# Patient Record
Sex: Male | Born: 1935 | Race: White | Hispanic: No | Marital: Single | Smoking: Former smoker
Health system: Southern US, Community
[De-identification: ages and names within clinical notes are randomized; demographics above are authoritative.]

## PROBLEM LIST (undated history)

## (undated) DIAGNOSIS — G8929 Other chronic pain: Secondary | ICD-10-CM

## (undated) DIAGNOSIS — M25562 Pain in left knee: Secondary | ICD-10-CM

## (undated) DIAGNOSIS — M1990 Unspecified osteoarthritis, unspecified site: Secondary | ICD-10-CM

## (undated) DIAGNOSIS — N1330 Unspecified hydronephrosis: Secondary | ICD-10-CM

## (undated) DIAGNOSIS — R937 Abnormal findings on diagnostic imaging of other parts of musculoskeletal system: Secondary | ICD-10-CM

## (undated) DIAGNOSIS — R972 Elevated prostate specific antigen [PSA]: Secondary | ICD-10-CM

## (undated) DIAGNOSIS — M25561 Pain in right knee: Secondary | ICD-10-CM

## (undated) DIAGNOSIS — R9089 Other abnormal findings on diagnostic imaging of central nervous system: Secondary | ICD-10-CM

## (undated) DIAGNOSIS — K635 Polyp of colon: Secondary | ICD-10-CM

## (undated) DIAGNOSIS — N400 Enlarged prostate without lower urinary tract symptoms: Secondary | ICD-10-CM

## (undated) DIAGNOSIS — G629 Polyneuropathy, unspecified: Secondary | ICD-10-CM

## (undated) DIAGNOSIS — E039 Hypothyroidism, unspecified: Secondary | ICD-10-CM

## (undated) DIAGNOSIS — N189 Chronic kidney disease, unspecified: Secondary | ICD-10-CM

## (undated) DIAGNOSIS — K219 Gastro-esophageal reflux disease without esophagitis: Secondary | ICD-10-CM

## (undated) DIAGNOSIS — Z5189 Encounter for other specified aftercare: Secondary | ICD-10-CM

## (undated) DIAGNOSIS — R51 Headache: Secondary | ICD-10-CM

## (undated) DIAGNOSIS — I10 Essential (primary) hypertension: Secondary | ICD-10-CM

## (undated) DIAGNOSIS — E785 Hyperlipidemia, unspecified: Secondary | ICD-10-CM

## (undated) DIAGNOSIS — D649 Anemia, unspecified: Secondary | ICD-10-CM

## (undated) DIAGNOSIS — M542 Cervicalgia: Secondary | ICD-10-CM

## (undated) DIAGNOSIS — Z8679 Personal history of other diseases of the circulatory system: Secondary | ICD-10-CM

## (undated) DIAGNOSIS — Z789 Other specified health status: Secondary | ICD-10-CM

## (undated) DIAGNOSIS — M5020 Other cervical disc displacement, unspecified cervical region: Secondary | ICD-10-CM

## (undated) DIAGNOSIS — N2581 Secondary hyperparathyroidism of renal origin: Secondary | ICD-10-CM

## (undated) DIAGNOSIS — C801 Malignant (primary) neoplasm, unspecified: Secondary | ICD-10-CM

## (undated) DIAGNOSIS — M722 Plantar fascial fibromatosis: Secondary | ICD-10-CM

## (undated) DIAGNOSIS — K644 Residual hemorrhoidal skin tags: Secondary | ICD-10-CM

## (undated) DIAGNOSIS — H269 Unspecified cataract: Secondary | ICD-10-CM

## (undated) HISTORY — DX: Pain in left knee: M25.562

## (undated) HISTORY — PX: COLONOSCOPY: SHX174

## (undated) HISTORY — DX: Other cervical disc displacement, unspecified cervical region: M50.20

## (undated) HISTORY — DX: Abnormal findings on diagnostic imaging of other parts of musculoskeletal system: R93.7

## (undated) HISTORY — DX: Malignant (primary) neoplasm, unspecified: C80.1

## (undated) HISTORY — DX: Pain in right knee: M25.561

## (undated) HISTORY — PX: POLYPECTOMY: SHX149

## (undated) HISTORY — DX: Other specified health status: Z78.9

## (undated) HISTORY — DX: Anemia, unspecified: D64.9

## (undated) HISTORY — DX: Other chronic pain: G89.29

## (undated) HISTORY — DX: Headache: R51

## (undated) HISTORY — DX: Encounter for other specified aftercare: Z51.89

## (undated) HISTORY — DX: Chronic kidney disease, unspecified: N18.9

## (undated) HISTORY — DX: Unspecified osteoarthritis, unspecified site: M19.90

## (undated) HISTORY — PX: RHINOPLASTY: SUR1284

## (undated) HISTORY — DX: Gastro-esophageal reflux disease without esophagitis: K21.9

## (undated) HISTORY — DX: Cervicalgia: M54.2

## (undated) HISTORY — DX: Residual hemorrhoidal skin tags: K64.4

## (undated) HISTORY — DX: Hyperlipidemia, unspecified: E78.5

## (undated) HISTORY — DX: Hypothyroidism, unspecified: E03.9

## (undated) HISTORY — DX: Unspecified cataract: H26.9

## (undated) HISTORY — DX: Essential (primary) hypertension: I10

## (undated) HISTORY — DX: Other abnormal findings on diagnostic imaging of central nervous system: R90.89

## (undated) HISTORY — PX: MELANOMA EXCISION: SHX5266

## (undated) HISTORY — DX: Polyp of colon: K63.5

## (undated) NOTE — *Deleted (*Deleted)
Patient ID: Richard Moses, male   DOB: May 05, 1933, 41 y.o.   MRN: [id number]  This NP visited patient at the bedside as a follow up to  yesterday's [place].  Son/Anthony at the bedsdie      Discussed with patient the importance of continued conversation with family and their  medical providers regarding overall plan of care and treatment options,  ensuring decisions are within the context of the patients values and GOCs.  Questions and concerns addressed   Discussed with Dr  Total time spent on the unit was 60 minutes  Greater than 50% of the time was spent in counseling and coordination of care  Wadie Lessen NP  Palliative Medicine Team Team Phone # [phone] Pager [phone]

---

## 2004-11-04 ENCOUNTER — Ambulatory Visit: Payer: Self-pay | Admitting: Internal Medicine

## 2005-06-07 DIAGNOSIS — K644 Residual hemorrhoidal skin tags: Secondary | ICD-10-CM

## 2005-06-07 DIAGNOSIS — K635 Polyp of colon: Secondary | ICD-10-CM

## 2005-06-07 HISTORY — DX: Residual hemorrhoidal skin tags: K64.4

## 2005-06-07 HISTORY — DX: Polyp of colon: K63.5

## 2005-08-24 ENCOUNTER — Ambulatory Visit: Payer: Self-pay | Admitting: Internal Medicine

## 2005-08-25 ENCOUNTER — Encounter (INDEPENDENT_AMBULATORY_CARE_PROVIDER_SITE_OTHER): Payer: Self-pay | Admitting: *Deleted

## 2005-08-25 ENCOUNTER — Ambulatory Visit (HOSPITAL_COMMUNITY): Admission: RE | Admit: 2005-08-25 | Discharge: 2005-08-25 | Payer: Self-pay | Admitting: Internal Medicine

## 2005-09-08 ENCOUNTER — Ambulatory Visit: Payer: Self-pay | Admitting: Internal Medicine

## 2005-09-14 ENCOUNTER — Encounter: Admission: RE | Admit: 2005-09-14 | Discharge: 2005-09-14 | Payer: Self-pay | Admitting: Internal Medicine

## 2005-09-14 ENCOUNTER — Ambulatory Visit: Payer: Self-pay | Admitting: Internal Medicine

## 2005-10-12 ENCOUNTER — Ambulatory Visit: Payer: Self-pay | Admitting: Gastroenterology

## 2005-10-19 ENCOUNTER — Ambulatory Visit: Payer: Self-pay | Admitting: Internal Medicine

## 2005-10-20 ENCOUNTER — Ambulatory Visit: Payer: Self-pay | Admitting: Gastroenterology

## 2006-06-27 ENCOUNTER — Ambulatory Visit: Payer: Self-pay | Admitting: Sports Medicine

## 2006-07-27 ENCOUNTER — Encounter (INDEPENDENT_AMBULATORY_CARE_PROVIDER_SITE_OTHER): Payer: Self-pay | Admitting: Family Medicine

## 2006-07-27 ENCOUNTER — Ambulatory Visit: Payer: Self-pay | Admitting: Family Medicine

## 2006-07-27 LAB — CONVERTED CEMR LAB
Calcium: 9.2 mg/dL (ref 8.4–10.5)
Cholesterol: 224 mg/dL — ABNORMAL HIGH (ref 0–200)
Glucose, Bld: 130 mg/dL — ABNORMAL HIGH (ref 70–99)
Potassium: 5 meq/L (ref 3.5–5.3)
Sodium: 141 meq/L (ref 135–145)
Triglycerides: 275 mg/dL — ABNORMAL HIGH (ref <150)

## 2006-08-04 ENCOUNTER — Ambulatory Visit: Payer: Self-pay | Admitting: Sports Medicine

## 2006-09-08 ENCOUNTER — Encounter (INDEPENDENT_AMBULATORY_CARE_PROVIDER_SITE_OTHER): Payer: Self-pay | Admitting: Family Medicine

## 2006-09-08 ENCOUNTER — Ambulatory Visit: Payer: Self-pay | Admitting: Sports Medicine

## 2007-04-20 ENCOUNTER — Encounter (INDEPENDENT_AMBULATORY_CARE_PROVIDER_SITE_OTHER): Payer: Self-pay | Admitting: Family Medicine

## 2007-04-20 LAB — CONVERTED CEMR LAB
Cholesterol, target level: 200 mg/dL
LDL Goal: 100 mg/dL

## 2007-06-27 ENCOUNTER — Encounter (INDEPENDENT_AMBULATORY_CARE_PROVIDER_SITE_OTHER): Payer: Self-pay | Admitting: Family Medicine

## 2007-06-27 ENCOUNTER — Ambulatory Visit: Payer: Self-pay | Admitting: Family Medicine

## 2007-06-27 LAB — CONVERTED CEMR LAB
Calcium: 8.5 mg/dL (ref 8.4–10.5)
Creatinine, Ser: 0.91 mg/dL (ref 0.40–1.50)
Glucose, Bld: 127 mg/dL — ABNORMAL HIGH (ref 70–99)
Sodium: 142 meq/L (ref 135–145)

## 2007-07-06 ENCOUNTER — Encounter (INDEPENDENT_AMBULATORY_CARE_PROVIDER_SITE_OTHER): Payer: Self-pay | Admitting: Family Medicine

## 2007-07-12 ENCOUNTER — Ambulatory Visit: Payer: Self-pay | Admitting: Family Medicine

## 2007-07-12 ENCOUNTER — Encounter (INDEPENDENT_AMBULATORY_CARE_PROVIDER_SITE_OTHER): Payer: Self-pay | Admitting: Family Medicine

## 2007-08-18 ENCOUNTER — Telehealth: Payer: Self-pay | Admitting: *Deleted

## 2007-09-01 ENCOUNTER — Ambulatory Visit: Payer: Self-pay | Admitting: Family Medicine

## 2007-11-13 ENCOUNTER — Ambulatory Visit: Payer: Self-pay | Admitting: Sports Medicine

## 2007-12-07 ENCOUNTER — Ambulatory Visit: Payer: Self-pay | Admitting: Family Medicine

## 2008-03-22 ENCOUNTER — Ambulatory Visit: Payer: Self-pay | Admitting: Family Medicine

## 2008-03-22 ENCOUNTER — Encounter: Payer: Self-pay | Admitting: Family Medicine

## 2008-03-22 LAB — CONVERTED CEMR LAB
Hemoglobin: 15.1 g/dL (ref 13.0–17.0)
MCHC: 32.8 g/dL (ref 30.0–36.0)
MCV: 97.7 fL (ref 78.0–100.0)
Platelets: 181 10*3/uL (ref 150–400)
RBC: 4.72 M/uL (ref 4.22–5.81)
RDW: 12.7 % (ref 11.5–15.5)
WBC: 7.5 10*3/uL (ref 4.0–10.5)

## 2008-03-23 ENCOUNTER — Encounter: Payer: Self-pay | Admitting: Family Medicine

## 2008-03-25 ENCOUNTER — Encounter: Payer: Self-pay | Admitting: Family Medicine

## 2008-03-28 ENCOUNTER — Telehealth: Payer: Self-pay | Admitting: Family Medicine

## 2008-05-28 ENCOUNTER — Ambulatory Visit: Payer: Self-pay | Admitting: Family Medicine

## 2008-06-24 ENCOUNTER — Telehealth: Payer: Self-pay | Admitting: *Deleted

## 2008-07-02 ENCOUNTER — Ambulatory Visit: Payer: Self-pay | Admitting: Family Medicine

## 2008-07-02 LAB — CONVERTED CEMR LAB: Hgb A1c MFr Bld: 7.2 %

## 2008-08-09 ENCOUNTER — Ambulatory Visit: Payer: Self-pay | Admitting: Family Medicine

## 2009-02-18 ENCOUNTER — Ambulatory Visit: Payer: Self-pay | Admitting: Family Medicine

## 2009-02-18 ENCOUNTER — Encounter: Payer: Self-pay | Admitting: Family Medicine

## 2009-02-18 DIAGNOSIS — E119 Type 2 diabetes mellitus without complications: Secondary | ICD-10-CM | POA: Insufficient documentation

## 2009-02-18 LAB — CONVERTED CEMR LAB: Hgb A1c MFr Bld: 6.9 %

## 2009-03-03 ENCOUNTER — Encounter: Payer: Self-pay | Admitting: Family Medicine

## 2009-03-24 ENCOUNTER — Ambulatory Visit: Payer: Self-pay | Admitting: Family Medicine

## 2009-03-24 ENCOUNTER — Encounter: Payer: Self-pay | Admitting: Family Medicine

## 2009-03-24 LAB — CONVERTED CEMR LAB
AST: 26 units/L (ref 0–37)
Alkaline Phosphatase: 49 units/L (ref 39–117)
Chloride: 104 meq/L (ref 96–112)
Glucose, Bld: 118 mg/dL — ABNORMAL HIGH (ref 70–99)
HDL: 44 mg/dL (ref >39)
PSA: 5.42 ng/mL — ABNORMAL HIGH (ref 0.10–4.00)
Sodium: 145 meq/L (ref 135–145)
Total Protein: 7 g/dL (ref 6.0–8.3)

## 2009-03-25 ENCOUNTER — Ambulatory Visit: Payer: Self-pay | Admitting: Family Medicine

## 2009-03-25 ENCOUNTER — Encounter: Payer: Self-pay | Admitting: Family Medicine

## 2009-04-15 ENCOUNTER — Ambulatory Visit: Payer: Self-pay | Admitting: Family Medicine

## 2009-05-20 ENCOUNTER — Ambulatory Visit: Payer: Self-pay | Admitting: Family Medicine

## 2009-05-20 DIAGNOSIS — R972 Elevated prostate specific antigen [PSA]: Secondary | ICD-10-CM | POA: Insufficient documentation

## 2009-05-20 DIAGNOSIS — E785 Hyperlipidemia, unspecified: Secondary | ICD-10-CM | POA: Insufficient documentation

## 2009-12-25 ENCOUNTER — Ambulatory Visit: Payer: Self-pay | Admitting: Family Medicine

## 2009-12-25 ENCOUNTER — Encounter: Payer: Self-pay | Admitting: Family Medicine

## 2009-12-25 DIAGNOSIS — I10 Essential (primary) hypertension: Secondary | ICD-10-CM | POA: Insufficient documentation

## 2009-12-25 DIAGNOSIS — R209 Unspecified disturbances of skin sensation: Secondary | ICD-10-CM | POA: Insufficient documentation

## 2009-12-25 LAB — CONVERTED CEMR LAB: Testosterone: 605.02 ng/dL (ref 350–890)

## 2009-12-26 ENCOUNTER — Encounter: Payer: Self-pay | Admitting: Family Medicine

## 2010-01-05 ENCOUNTER — Encounter: Payer: Self-pay | Admitting: Family Medicine

## 2010-04-01 ENCOUNTER — Ambulatory Visit: Payer: Self-pay | Admitting: Family Medicine

## 2010-04-01 DIAGNOSIS — C434 Malignant melanoma of scalp and neck: Secondary | ICD-10-CM | POA: Insufficient documentation

## 2010-04-01 DIAGNOSIS — B356 Tinea cruris: Secondary | ICD-10-CM | POA: Insufficient documentation

## 2010-04-01 LAB — CONVERTED CEMR LAB: Hgb A1c MFr Bld: 6.8 %

## 2010-04-06 ENCOUNTER — Encounter: Payer: Self-pay | Admitting: Family Medicine

## 2010-04-20 ENCOUNTER — Encounter: Payer: Self-pay | Admitting: Family Medicine

## 2010-06-07 DIAGNOSIS — M542 Cervicalgia: Secondary | ICD-10-CM

## 2010-06-07 HISTORY — DX: Cervicalgia: M54.2

## 2010-07-03 ENCOUNTER — Ambulatory Visit: Admission: RE | Admit: 2010-07-03 | Discharge: 2010-07-03 | Payer: Self-pay | Source: Home / Self Care

## 2010-07-03 DIAGNOSIS — M542 Cervicalgia: Secondary | ICD-10-CM | POA: Insufficient documentation

## 2010-07-03 DIAGNOSIS — G589 Mononeuropathy, unspecified: Secondary | ICD-10-CM | POA: Insufficient documentation

## 2010-07-03 LAB — CONVERTED CEMR LAB: Hgb A1c MFr Bld: 7 %

## 2010-07-08 NOTE — Miscellaneous (Signed)
Summary: walk in   Clinical Lists Changes came in asking what his last A1C showed. I gave him this number as well as the past few. he is very well educated about diabetes, as it is in his siblings. does not like to exercise, although he lives 4 blocks from the [facility]. discussed diet. more vegt, lean meat & lower carbs. eats cereal with skim milk for breakfast. uses Stevia with berry powder shakes. ate bean burrito today for lunch . suggested he use his computer to get lists of better choices for vegtables, carbs, etc. told him I will send a note to his pcp to see if he needed to come back in for OV & meds. he related some of the herbal things he takes to "suppress blood sugar".Elige Radon RN  January 05, 2010 3:17 PM  Routine follow up in 3 months Mylinda Latina MD  January 06, 2010 1:43 PM

## 2010-07-08 NOTE — Letter (Signed)
Summary: [facility]   Imported By: Samara Snide 04/24/2010 11:04:25  _____________________________________________________________________  External Attachment:    Type:   Image     Comment:   External Document
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
## 2010-07-08 NOTE — Letter (Signed)
Summary: Generic Letter  [facility]  [address]   [place], [place] [address]   Phone: [phone]  Fax: [phone]    12/26/2009  [place], [place]  [address]  Dear Mr. GABAY,  Here is a copy of your lab results.  Tests: (1) Testosterone, Total [id number])   Testosterone, Total       605.02 ng/dL                350-890                Tanner Stage       Male              Male              I              < 30 ng/dL        < 10 ng/dL              II             < 150 ng/dL       < 30 ng/dL              III            100-320 ng/dL     < 35 ng/dL              IV             200-970 ng/dL     15-40 ng/dL              V              350-890 ng/dL     10-70 ng/dL         Tests: (2) Vitamin D (25-Hydroxy) [id number])  Vitamin D (25-Hydroxy)                             78 ng/mL                    30-89    Sincerely,   Mylinda Latina MD  Appended Document: Generic Letter mailed
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
## 2010-09-11 LAB — GLUCOSE, CAPILLARY: Glucose-Capillary: 114 mg/dL — ABNORMAL HIGH (ref 70–99)

## 2010-09-14 ENCOUNTER — Encounter: Payer: Self-pay | Admitting: Family Medicine

## 2010-09-14 NOTE — Progress Notes (Signed)
  Subjective:    Patient ID: Richard Moses, male    DOB: [birth year], 77 y.o.   MRN: [id number]  HPI  Neurology appointment with [facility])  A/P:  Neuropathy and Disturbance of skin sensation 1. MRI brain and cervical spine 2. TSH, B12 3. After above testing, consider repeating NCS with comparison to the right side and needle EMG  Review of Systems     Objective:   Physical Exam        Assessment & Plan:

## 2010-09-29 ENCOUNTER — Encounter: Payer: Self-pay | Admitting: Family Medicine

## 2010-09-29 DIAGNOSIS — R9089 Other abnormal findings on diagnostic imaging of central nervous system: Secondary | ICD-10-CM

## 2010-09-29 DIAGNOSIS — E039 Hypothyroidism, unspecified: Secondary | ICD-10-CM

## 2010-09-29 DIAGNOSIS — R937 Abnormal findings on diagnostic imaging of other parts of musculoskeletal system: Secondary | ICD-10-CM

## 2010-09-29 DIAGNOSIS — M5020 Other cervical disc displacement, unspecified cervical region: Secondary | ICD-10-CM

## 2010-09-29 HISTORY — DX: Other cervical disc displacement, unspecified cervical region: M50.20

## 2010-09-29 HISTORY — DX: Abnormal findings on diagnostic imaging of other parts of musculoskeletal system: R93.7

## 2010-09-29 HISTORY — DX: Other abnormal findings on diagnostic imaging of central nervous system: R90.89

## 2010-09-29 HISTORY — DX: Hypothyroidism, unspecified: E03.9

## 2010-09-29 NOTE — Progress Notes (Signed)
  Subjective:    Patient ID: Richard Moses, male    DOB: 06-Oct-1932, 77 y.o.   MRN: [id number]  HPI Results from [facility]  TSH: 6.190 (H)    Vitamin B12 - 1891 (H)           Please follow up on TSH and B12 levels  MRI of cervical spine:  Mildly abnormal MRI cervical spine demonstrating mild spondylosis and disc bulging from C4-5 down to C7-T1.  No spinal stenosis or foraminal narrowing  MRI brain:  Abnormal MRI of brain, demonstrating mild atrophy   Dr. Leta Baptist   Review of Systems     Objective:   Physical Exam        Assessment & Plan:

## 2010-10-01 ENCOUNTER — Ambulatory Visit (INDEPENDENT_AMBULATORY_CARE_PROVIDER_SITE_OTHER): Payer: Medicare HMO | Admitting: Family Medicine

## 2010-10-01 ENCOUNTER — Encounter: Payer: Self-pay | Admitting: Family Medicine

## 2010-10-01 DIAGNOSIS — E039 Hypothyroidism, unspecified: Secondary | ICD-10-CM | POA: Insufficient documentation

## 2010-10-01 DIAGNOSIS — H9319 Tinnitus, unspecified ear: Secondary | ICD-10-CM

## 2010-10-01 DIAGNOSIS — E119 Type 2 diabetes mellitus without complications: Secondary | ICD-10-CM

## 2010-10-01 DIAGNOSIS — E1149 Type 2 diabetes mellitus with other diabetic neurological complication: Secondary | ICD-10-CM

## 2010-10-01 DIAGNOSIS — H9313 Tinnitus, bilateral: Secondary | ICD-10-CM | POA: Insufficient documentation

## 2010-10-01 DIAGNOSIS — I10 Essential (primary) hypertension: Secondary | ICD-10-CM

## 2010-10-01 NOTE — Assessment & Plan Note (Signed)
Referral to ENT for evaluation and see if they recommend the hearing device that is supposed to help

## 2010-10-01 NOTE — Progress Notes (Signed)
  Subjective:    Patient ID: Richard Moses, male    DOB: [birth year], 77 y.o.   MRN: [id number]  HPI 1. DMII:  Pt is only taking natural supplements for DMII.  He most recently has starting taking Berberine.  He doesn't check his blood sugars regularly.  He is not interested in starting a medication  2. HTN:  He does check his blood pressure at home regularly and it is always around 120/80.  He is not sure why it is up today.  Not interested in starting a medication  3. Peripheral Neuropathy:  Interested in starting a natural supplement to help with Peripheral Neuropathy  4.  Hypothyroidism:  Diagnosed by TSH by [facility].  He is not interested in starting a medication  5. Tinnitus:  Has had this for years.  Wants to try a new device that is supposed to help.  Needs a referral to ENT to try and get insurance to pay for it.   Review of Systems  Constitutional: Negative for fever, activity change, fatigue and unexpected weight change.  HENT: Positive for tinnitus. Negative for neck pain and neck stiffness.   Eyes: Negative for visual disturbance.  Respiratory: Negative for cough, shortness of breath and wheezing.   Cardiovascular: Negative for chest pain.  Gastrointestinal: Negative for abdominal distention.  Neurological: Negative for dizziness, light-headedness and headaches.       Objective:   Physical Exam  Constitutional: He is oriented to person, place, and time. He appears well-nourished. No distress.  HENT:  Head: Normocephalic and atraumatic.  Right Ear: External ear normal.  Left Ear: External ear normal.       TM's normal bilaterally  Eyes: Conjunctivae are normal.  Neck: Normal range of motion. Neck supple. No thyromegaly present.  Cardiovascular: Normal rate and regular rhythm.   Murmur heard. Pulmonary/Chest: Effort normal and breath sounds normal. No respiratory distress. He has no wheezes.  Abdominal: Soft. He exhibits no distension.    Musculoskeletal: Normal range of motion. He exhibits no edema.  Lymphadenopathy:    He has no cervical adenopathy.  Neurological: He is alert and oriented to person, place, and time.          Assessment & Plan:

## 2010-10-01 NOTE — Assessment & Plan Note (Signed)
Elevated in clinic.  Pt states that it is normal at home.  Will continue to monitor.

## 2010-10-01 NOTE — Assessment & Plan Note (Signed)
Check A1C today.  He will only do natural supplements for treatment.

## 2010-10-01 NOTE — Patient Instructions (Signed)
It was good to see you again today We will check an A1C today and see how your diabetes is doing with the new supplements We will also get you set up with an ENT doctor to evaluate the tinnitus and see if the device may help you

## 2010-10-01 NOTE — Assessment & Plan Note (Signed)
Elevated TSH.  Pt is not interested in starting thyroid supplementation.  Will recheck in 3 months.

## 2010-10-15 ENCOUNTER — Encounter: Payer: Self-pay | Admitting: Family Medicine

## 2010-10-15 DIAGNOSIS — Z9114 Patient's other noncompliance with medication regimen: Secondary | ICD-10-CM | POA: Insufficient documentation

## 2010-10-20 ENCOUNTER — Ambulatory Visit (INDEPENDENT_AMBULATORY_CARE_PROVIDER_SITE_OTHER): Payer: Medicare HMO | Admitting: Family Medicine

## 2010-10-20 ENCOUNTER — Encounter: Payer: Self-pay | Admitting: Family Medicine

## 2010-10-20 VITALS — BP 170/73 | HR 75 | Temp 97.9°F | Wt 195.0 lb

## 2010-10-20 DIAGNOSIS — K649 Unspecified hemorrhoids: Secondary | ICD-10-CM | POA: Insufficient documentation

## 2010-10-20 DIAGNOSIS — R300 Dysuria: Secondary | ICD-10-CM | POA: Insufficient documentation

## 2010-10-20 DIAGNOSIS — I10 Essential (primary) hypertension: Secondary | ICD-10-CM

## 2010-10-20 LAB — POCT URINALYSIS DIPSTICK
Bilirubin, UA: NEGATIVE
Glucose, UA: NEGATIVE
Ketones, UA: NEGATIVE
Nitrite, UA: NEGATIVE
Spec Grav, UA: 1.015

## 2010-10-20 LAB — POCT UA - MICROSCOPIC ONLY

## 2010-10-20 MED ORDER — CIPROFLOXACIN HCL 500 MG PO TABS: 500.0000 mg | ORAL_TABLET | Freq: Two times a day (BID) | ORAL | Status: AC

## 2010-10-20 NOTE — Assessment & Plan Note (Addendum)
No active bleeding.  No thrombosed hemorrhoids.  He is having normal bowel movements.  Unable to assess internal hemorrhoids.  Offered general surgery referral but he refused.  Will continue to monitor.

## 2010-10-20 NOTE — Patient Instructions (Addendum)
Urinary Tract Infection (UTI) Please schedule a follow up appointment in 1-2 weeks  Infections of the urinary tract can start in several places. A bladder infection (cystitis), a kidney infection (pyelonephritis), and a prostate infection (prostatitis) are different types of urinary tract infections. They usually get better if treated with medicines (antibiotics) that kill germs. Take all the medicine until it is gone. You or your child may feel better in a few days, but TAKE ALL MEDICINE or the infection may not respond and may become more difficult to treat. HOME CARE INSTRUCTIONS  Drink enough water and fluids to keep the urine clear or pale yellow. Cranberry juice is especially recommended, in addition to large amounts of water.   Avoid caffeine, tea, and carbonated beverages. They tend to irritate the bladder.   Alcohol may irritate the prostate.   Only take over-the-counter or prescription medicines for pain, discomfort, or fever as directed by your caregiver.  FINDING OUT THE RESULTS OF YOUR TEST Not all test results are available during your visit. If your or your child's test results are not back during the visit, make an appointment with your caregiver to find out the results. Do not assume everything is normal if you have not heard from your caregiver or the medical facility. It is important for you to follow up on all test results. TO PREVENT FURTHER INFECTIONS:  Empty the bladder often. Avoid holding urine for long periods of time.   After a bowel movement, women should cleanse from front to back. Use each tissue only once.   Empty the bladder before and after sexual intercourse.  SEEK MEDICAL CARE IF:  There is back pain.   You or your child has an oral temperature above 100.4.   Your baby is older than 3 months with a rectal temperature of 100.5 F (38.1 C) or higher for more than 1 day.   Your or your child's problems (symptoms) are no better in 3 days. Return sooner if  you or your child is getting worse.  SEEK IMMEDIATE MEDICAL CARE IF:  There is severe back pain or lower abdominal pain.   You or your child develops chills.   You or your child has an oral temperature above 100.4, not controlled by medicine.   Your baby is older than 3 months with a rectal temperature of 102 F (38.9 C) or higher.   Your baby is 63 months old or younger with a rectal temperature of 100.4 F (38 C) or higher.   There is nausea or vomiting.   There is continued burning or discomfort with urination.  MAKE SURE YOU:  Understand these instructions.   Will watch this condition.   Will get help right away if you or your child is not doing well or gets worse.  Document Released: 03/03/2005 Document Re-Released: 08/18/2009 [facility] Patient Information 2011 [place].

## 2010-10-20 NOTE — Progress Notes (Signed)
  Subjective:    Patient ID: Richard Moses, male    DOB: 02-24-1933, 77 y.o.   MRN: [id number]  HPI 1.  Dysuria:  He has had this for about 3 weeks.  He has a hx of similar episodes that resolve with the use of cranberry juice.  He has been drinking cranberry juice and it hasn't been helping.  He has burning when he urinates and he has to urinate much more frequently.  Over the past couple of days he has noticed a little bit of blood with the urine.  He has not had any fevers or chills.  Denies weak stream, dribbling.  Endorses increased frequency of urination over the past couple of months.  2.  Hemorrhoids:  He has a hx of hemorrhoids.  They have been worse for the past 3 weeks.  He has had a couple episodes of rectal bleeding.  They have since resolved.  His bowels are normal and soft.  Denies rectal pain or any current bleeding.  3.  Hypertension:  He has been checking his blood pressures at home.  He is taking natural supplements to help control his blood pressure.  He is not interested in starting a new blood pressure medicine.   Review of Systems Denies fevers, chills, malaise.  Denies pelvic pain, abdominal pain.  Denies penile discharge.  Denies penile lesions or rash.  Denies constipation or diarrhea.    Objective:   Physical Exam  Vitals reviewed. Constitutional: He appears well-nourished. No distress.  Eyes: Conjunctivae are normal. No scleral icterus.  Cardiovascular: Normal rate and regular rhythm.   Pulmonary/Chest: Effort normal and breath sounds normal. No respiratory distress. He has no wheezes.  Abdominal: Soft. Bowel sounds are normal. He exhibits no distension. There is no tenderness. There is no rebound and no guarding.  Genitourinary: Penis normal. No penile tenderness.       1x1cm external hemorrhoid.  Non-thrombosed.  No active bleeding. Refused prostate exam.  He does self prostate exams and checked himself this morning and it did feel slightly enlarged compared to  normal.  It was not tender or painful.  Skin: No rash noted. He is not diaphoretic.          Assessment & Plan:

## 2010-10-20 NOTE — Assessment & Plan Note (Addendum)
UA positive.  UTI +/- prostatitis.  He has no red flags currently.  Able to produce urine.  No fevers, chills, abdominal pain.  Will treat with Cipro to get better penetration into the prostate.  He has a hx of elevated PSA.  Will need to recheck that once the acute infection has resolved.  Handout and precautions given.

## 2010-10-20 NOTE — Assessment & Plan Note (Signed)
Elevated in clinic again.  He states that it is normal at home.  He is not interested in starting a new medication, he would like to just continue natural treatments.  Reviewed risks of elevated blood pressure with patient including: heart attack, stroke, kidney damage.

## 2010-11-09 ENCOUNTER — Encounter: Payer: Self-pay | Admitting: Family Medicine

## 2010-11-09 ENCOUNTER — Ambulatory Visit (INDEPENDENT_AMBULATORY_CARE_PROVIDER_SITE_OTHER): Payer: Medicare HMO | Admitting: Family Medicine

## 2010-11-09 VITALS — BP 160/76 | HR 88 | Temp 98.0°F | Wt 193.0 lb

## 2010-11-09 DIAGNOSIS — R972 Elevated prostate specific antigen [PSA]: Secondary | ICD-10-CM

## 2010-11-09 DIAGNOSIS — R300 Dysuria: Secondary | ICD-10-CM

## 2010-11-09 DIAGNOSIS — I10 Essential (primary) hypertension: Secondary | ICD-10-CM

## 2010-11-09 LAB — POCT URINALYSIS DIPSTICK
Blood, UA: NEGATIVE
pH, UA: 7

## 2010-11-09 NOTE — Assessment & Plan Note (Signed)
Remains elevated but he is not interested in starting medications.

## 2010-11-09 NOTE — Assessment & Plan Note (Signed)
Discussed the risks of benefits or rechecking a PSA.  Pt felt strongly that there was no need to recheck the PSA.

## 2010-11-09 NOTE — Assessment & Plan Note (Signed)
UA improved.  No signs of infection.  All symptoms have improved.

## 2010-11-09 NOTE — Progress Notes (Signed)
  Subjective:    Patient ID: Richard Moses, male    DOB: 1933-04-21, 77 y.o.   MRN: [id number]  Urinary Tract Infection    1.  Dysuria:  Was seen in clinic a couple of weeks ago for this complaint.  He was put on Cipro for presumed UTI / Prostatitis.  He was complaining of dysuria, increased frequency, and decreased urinary volume.  All of these complaints have resolved.  He is now feeling much better.  He has not had any fevers or chills.  Denies weak stream, dribbling.  Endorses increased frequency of urination over the past couple of months.  2.  Hypertension:  He has been checking his blood pressures at home.  He is taking natural supplements to help control his blood pressure.  He is not interested in starting a new blood pressure medicine.  3. Right toe injury:  He kicks a TV at home a couple of days ago.  He doesn't think that it is broken.  It is only bruised.  He denies any current pain.   Review of Systems  Denies fevers, chills, malaise.  Denies pelvic pain, abdominal pain.  Denies penile discharge.  Denies penile lesions or rash.  Denies constipation or diarrhea.    Objective:   Physical Exam  Vitals reviewed. Constitutional: He appears well-nourished. No distress.  Eyes: Conjunctivae are normal. No scleral icterus.  Cardiovascular: Normal rate and regular rhythm.   Pulmonary/Chest: Effort normal and breath sounds normal. No respiratory distress. He has no wheezes.  Abdominal: Soft. Bowel sounds are normal. He exhibits no distension. There is no tenderness. There is no rebound and no guarding.  Genitourinary: Penis normal. No penile tenderness.       Refused prostate exam.  He does self prostate exams and denies change in prostate exam.  Prostate is enlarged but denies any tenderness or nodules.  Musculoskeletal:       Right foot: 2nd big toe is bruised.  Normal ROM.  Mildly TTP.  Skin: No rash noted. He is not diaphoretic.          Assessment & Plan:

## 2011-03-09 LAB — GLUCOSE, CAPILLARY: Glucose-Capillary: 121 — ABNORMAL HIGH

## 2012-01-20 ENCOUNTER — Other Ambulatory Visit: Payer: Self-pay | Admitting: Family Medicine

## 2012-01-20 ENCOUNTER — Encounter: Payer: Self-pay | Admitting: Family Medicine

## 2012-01-20 ENCOUNTER — Ambulatory Visit (INDEPENDENT_AMBULATORY_CARE_PROVIDER_SITE_OTHER): Payer: Medicare Other | Admitting: Family Medicine

## 2012-01-20 DIAGNOSIS — Z9119 Patient's noncompliance with other medical treatment and regimen: Secondary | ICD-10-CM

## 2012-01-20 DIAGNOSIS — Z9114 Patient's other noncompliance with medication regimen: Secondary | ICD-10-CM

## 2012-01-20 DIAGNOSIS — G589 Mononeuropathy, unspecified: Secondary | ICD-10-CM

## 2012-01-20 DIAGNOSIS — I10 Essential (primary) hypertension: Secondary | ICD-10-CM

## 2012-01-20 DIAGNOSIS — E785 Hyperlipidemia, unspecified: Secondary | ICD-10-CM

## 2012-01-20 DIAGNOSIS — E1142 Type 2 diabetes mellitus with diabetic polyneuropathy: Secondary | ICD-10-CM

## 2012-01-20 DIAGNOSIS — E1149 Type 2 diabetes mellitus with other diabetic neurological complication: Secondary | ICD-10-CM

## 2012-01-20 DIAGNOSIS — E039 Hypothyroidism, unspecified: Secondary | ICD-10-CM | POA: Insufficient documentation

## 2012-01-20 LAB — VITAMIN B12: Vitamin B12 Bind Capacity: 1891

## 2012-01-20 NOTE — Assessment & Plan Note (Signed)
A: patient continues to decline prescription medications and immunizations. P: asked patient to compile a list of his supplements and send them to me so I can input them into his chart.

## 2012-01-20 NOTE — Progress Notes (Signed)
Subjective:     Patient ID: NOVEL GLASSCO, male   DOB: Sep 14, 1932, 78 y.o.   MRN: [id number]  HPI 78 yo M presents for f/u to meet new MD and to discuss the following  1. DM2: diet and supplement controlled. Patient has never used oral hypoglycemics or insulin. He admits to peripheral neuropathy. He denies CP, SOB, visions changes and GI upset. He exercises 3 x per weeks for 2 hrs.   2. HTN: no mediations. Denies symptoms as per above as well as LE edema.   3. L headaches: dull, off and on for many years. He was a normal MRI of the head because of his strong family history of brain tumors. Pain is associated with L sided neck stiffness.   4. Hypothyroidism: diagnosed on labs obtained by [facility] neurology in 2012. Patient seemed not to remember this. He was not interested in therapy.   5. Health maintenance: due to zostavax and declines. Has has a colonoscopy done at [place] in the past 10 years. Is now outside of the age for colon CA screening.    History   Social History  . Marital Status: Single    Spouse Name: N/A    Number of Children: 0  . Years of Education: N/A   Occupational History  .     Social History Main Topics  . Smoking status: Never Smoker   . Smokeless tobacco: Never Used  . Alcohol Use: No  . Drug Use: No  . Sexually Active: No   Other Topics Concern  . Not on file   Social History Narrative   Lives with boarder (young [place] man)Works out 3x per week at [facility] lives in [place] where he is from originally    Review of Systems As per HPI     Objective:   Physical Exam There were no vitals taken for this visit. General appearance: alert, cooperative and no distress Head: Normocephalic, without obvious abnormality, atraumatic Eyes: conjunctivae/corneas clear. PERRL, EOM's intact.  Ears: hearing aids in place.  normal TM's and external ear canals both ears Nose: Nares normal. Septum midline. Mucosa normal. No drainage or sinus  tenderness. Throat: lips, mucosa, and tongue normal; teeth and gums normal Neck: no adenopathy, no carotid bruit, no JVD, supple, symmetrical, trachea midline Lungs:  clear to auscultation bilaterally Heart: regular rate and rhythm, S1, S2 normal, no murmur, click, rub or gallop Extremities: extremities normal, atraumatic, no cyanosis or edema Skin: Skin color, texture, turgor normal. No rashes or lesions Neurologic: Grossly normal    Assessment and Plan:

## 2012-01-20 NOTE — Assessment & Plan Note (Signed)
A: seems to be progressing per patient. Likely related to longstanding DM. Possibly related to supplements (unsure).  P: monitor  F/u supplements

## 2012-01-20 NOTE — Assessment & Plan Note (Signed)
A: elevated above goal. P: encouraged patient to increase exercise.

## 2012-01-20 NOTE — Assessment & Plan Note (Signed)
Check lipids 

## 2012-01-20 NOTE — Assessment & Plan Note (Signed)
A: A1c at goal on diet and exercise plan. P: continue current management. Check lipids.

## 2012-01-20 NOTE — Assessment & Plan Note (Addendum)
A: untreated. Asymptomatic. No evidence of myxedema  P: recheck TSH

## 2012-01-20 NOTE — Patient Instructions (Addendum)
Mr. Glenney,  Thank you from coming today.   I will call or send a letter with your blood work results.  Please plan to f/u in 6 months to one year.   Dr. Adrian Blackwater

## 2012-01-21 LAB — LIPID PANEL
LDL Cholesterol: 159 mg/dL — ABNORMAL HIGH (ref 0–99)
Total CHOL/HDL Ratio: 5.2 Ratio
Triglycerides: 210 mg/dL — ABNORMAL HIGH (ref <150)
VLDL: 42 mg/dL — ABNORMAL HIGH (ref 0–40)

## 2012-01-21 LAB — COMPREHENSIVE METABOLIC PANEL
Albumin: 4.4 g/dL (ref 3.5–5.2)
Alkaline Phosphatase: 65 U/L (ref 39–117)
Creat: 0.85 mg/dL (ref 0.50–1.35)
Potassium: 4.3 mEq/L (ref 3.5–5.3)
Sodium: 142 mEq/L (ref 135–145)

## 2012-01-21 LAB — TSH: TSH: 2.365 u[IU]/mL (ref 0.350–4.500)

## 2012-01-26 ENCOUNTER — Encounter: Payer: Self-pay | Admitting: *Deleted

## 2012-01-26 ENCOUNTER — Encounter: Payer: Self-pay | Admitting: Family Medicine

## 2012-06-12 ENCOUNTER — Ambulatory Visit (INDEPENDENT_AMBULATORY_CARE_PROVIDER_SITE_OTHER): Payer: Medicare Other | Admitting: Family Medicine

## 2012-06-12 ENCOUNTER — Encounter: Payer: Self-pay | Admitting: Family Medicine

## 2012-06-12 VITALS — BP 160/96 | HR 101 | Temp 98.2°F | Ht 73.0 in | Wt 192.0 lb

## 2012-06-12 DIAGNOSIS — R319 Hematuria, unspecified: Secondary | ICD-10-CM

## 2012-06-12 DIAGNOSIS — R300 Dysuria: Secondary | ICD-10-CM

## 2012-06-12 DIAGNOSIS — E1149 Type 2 diabetes mellitus with other diabetic neurological complication: Secondary | ICD-10-CM

## 2012-06-12 DIAGNOSIS — I10 Essential (primary) hypertension: Secondary | ICD-10-CM

## 2012-06-12 LAB — POCT URINALYSIS DIPSTICK
Bilirubin, UA: NEGATIVE
Blood, UA: NEGATIVE
Glucose, UA: 100
Ketones, UA: NEGATIVE
Nitrite, UA: NEGATIVE
Protein, UA: NEGATIVE
Spec Grav, UA: 1.02
pH, UA: 6

## 2012-06-12 LAB — POCT GLYCOSYLATED HEMOGLOBIN (HGB A1C): Hemoglobin A1C: 7.6

## 2012-06-12 MED ORDER — CEPHALEXIN 500 MG PO CAPS: 500.0000 mg | ORAL_CAPSULE | Freq: Two times a day (BID) | ORAL | Status: DC

## 2012-06-12 NOTE — Assessment & Plan Note (Signed)
A1c today Pt wants to continue to try to control blood glucose w/ home vitamins only

## 2012-06-12 NOTE — Assessment & Plan Note (Signed)
Hypertensive in office but normotensive at home No antihypertnesive therapy at this time

## 2012-06-12 NOTE — Patient Instructions (Addendum)
Thank you for coming in today I am worried that you still have a urinary tract infection Please start taking Keflex twice a day for 10 days Do not stop taking this medicine prior to the end of 10 days If you have any concerns please call the clinic 24/7 We will check your A1c again today Please come back in 1 month to provide another urine sample  Urinary Tract Infection Urinary tract infections (UTIs) can develop anywhere along your urinary tract. Your urinary tract is your body's drainage system for removing wastes and extra water. Your urinary tract includes two kidneys, two ureters, a bladder, and a urethra. Your kidneys are a pair of bean-shaped organs. Each kidney is about the size of your fist. They are located below your ribs, one on each side of your spine. CAUSES Infections are caused by microbes, which are microscopic organisms, including fungi, viruses, and bacteria. These organisms are so small that they can only be seen through a microscope. Bacteria are the microbes that most commonly cause UTIs. SYMPTOMS  Symptoms of UTIs may vary by age and gender of the patient and by the location of the infection. Symptoms in young women typically include a frequent and intense urge to urinate and a painful, burning feeling in the bladder or urethra during urination. Older women and men are more likely to be tired, shaky, and weak and have muscle aches and abdominal pain. A fever may mean the infection is in your kidneys. Other symptoms of a kidney infection include pain in your back or sides below the ribs, nausea, and vomiting. DIAGNOSIS To diagnose a UTI, your caregiver will ask you about your symptoms. Your caregiver also will ask to provide a urine sample. The urine sample will be tested for bacteria and white blood cells. White blood cells are made by your body to help fight infection. TREATMENT  Typically, UTIs can be treated with medication. Because most UTIs are caused by a bacterial  infection, they usually can be treated with the use of antibiotics. The choice of antibiotic and length of treatment depend on your symptoms and the type of bacteria causing your infection. HOME CARE INSTRUCTIONS  If you were prescribed antibiotics, take them exactly as your caregiver instructs you. Finish the medication even if you feel better after you have only taken some of the medication.  Drink enough water and fluids to keep your urine clear or pale yellow.  Avoid caffeine, tea, and carbonated beverages. They tend to irritate your bladder.  Empty your bladder often. Avoid holding urine for long periods of time.  Empty your bladder before and after sexual intercourse.  After a bowel movement, women should cleanse from front to back. Use each tissue only once. SEEK MEDICAL CARE IF:   You have back pain.  You develop a fever.  Your symptoms do not begin to resolve within 3 days. SEEK IMMEDIATE MEDICAL CARE IF:   You have severe back pain or lower abdominal pain.  You develop chills.  You have nausea or vomiting.  You have continued burning or discomfort with urination. MAKE SURE YOU:   Understand these instructions.  Will watch your condition.  Will get help right away if you are not doing well or get worse. Document Released: 03/03/2005 Document Revised: 11/23/2011 Document Reviewed: 07/02/2011 [facility] Patient Information 2013 [place].

## 2012-06-12 NOTE — Assessment & Plan Note (Signed)
Hematuria in the past microscopically confirmed Possible gross though confounded by Pyridium UA today w/o evidence Repeat UA in 1 mo. Consider Urology consult vs imaging if possible or gross hematuria starts again

## 2012-06-12 NOTE — Progress Notes (Signed)
Richard Moses is a 79 y.o. male who presents to [facility] today for dysuria  Dysuria: R flank pain and painful urination when in [place] on 12/19. Reviewed medical chart from [place]. Treated w/ Pyridium and Cipro for UTI. Noted Glucose and Hgb on Urine. Urine became orange to reddish within 3 days of starting therapy so stopped cipro and pyridium. Symptoms had resolved at that time. H/o UTIs (significant BPH per ph). Now w/ 3 days of itching and burnign on urination. Mild R flank pain. Denies fever, n/v/d/c  Glucosuria: noted on UA in [place]. Last A1c august. Wants to continue controllinjg sugar w/ "Healthy Vitamins" from [place]  Hematuria: Noted on UA in [place]. No previous history.   HTN: States that BP at home sypically in 110s/60s. No CP, SOB, lightheadedness, syncope. Anxious everytime he comes into clinic  The following portions of the patient's history were reviewed and updated as appropriate: allergies, current medications, past medical history, family and social history, and problem list.  Patient is a nonsmoker  Past Medical History  Diagnosis Date  . Diabetes mellitus   . Hypertension   . Hyperlipidemia   . Neck pain on left side 2012  . Chronic left-sided headaches   . MRI of brain abnormal 09/29/10    Abnormal MRI of brain, demonstrating mild atrophy  . 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 Abnormal MRI, cervical spine 09/29/10    Mildly abnormal MRI cervical spine demonstrating mild spondylosis and disc bulging from C4-5 down to C7-T1.  No spinal stenosis or foraminal narrowing  . Hypothyroidism 09/29/10    Untreated. Patient not interested in Rx.     ROS as above otherwise neg.    Medications reviewed. Current Outpatient Prescriptions  Medication Sig Dispense Refill  . cephALEXin (KEFLEX) 500 MG capsule Take 1 capsule (500 mg total) by mouth 2 (two) times daily.  20 capsule  0    Exam: BP 160/96  Pulse 101  Temp 98.2 F (36.8 C) (Oral)  Ht 6'  1" (1.854 m)  Wt 192 lb (87.091 kg)  BMI 25.33 kg/m2 Gen: Well NAD HEENt: MMM EOMI Musc: No CVA tenderness.  Results for orders placed in visit on 06/12/12 (from the past 72 hour(s))  POCT URINALYSIS DIPSTICK     Status: Normal   Collection Time   06/12/12 10:42 AM      Component Value Range Comment   Color, UA YELLOW      Clarity, UA CLEAR      Glucose, UA 100      Bilirubin, UA NEG      Ketones, UA NEG      Spec Grav, UA 1.020      Blood, UA NEG      pH, UA 6.0      Protein, UA NEG      Urobilinogen, UA 0.2      Nitrite, UA NEG      Leukocytes, UA Negative

## 2012-06-12 NOTE — Assessment & Plan Note (Addendum)
Keflex for 10 days Repeat UA in 1 mo Urine sent for Cx and sensitivities DC pyridium

## 2012-07-13 ENCOUNTER — Other Ambulatory Visit (INDEPENDENT_AMBULATORY_CARE_PROVIDER_SITE_OTHER): Payer: Medicare Other

## 2012-07-13 DIAGNOSIS — R319 Hematuria, unspecified: Secondary | ICD-10-CM

## 2012-07-13 LAB — POCT URINALYSIS DIPSTICK
Nitrite, UA: NEGATIVE
Spec Grav, UA: 1.02
Urobilinogen, UA: 0.2

## 2012-07-13 NOTE — Progress Notes (Signed)
Pt returned for repeat urine;  Urine negative

## 2012-09-11 ENCOUNTER — Ambulatory Visit (INDEPENDENT_AMBULATORY_CARE_PROVIDER_SITE_OTHER): Payer: Medicare Other | Admitting: Family Medicine

## 2012-09-11 ENCOUNTER — Encounter: Payer: Self-pay | Admitting: Family Medicine

## 2012-09-11 VITALS — BP 160/94 | Ht 73.0 in | Wt 198.0 lb

## 2012-09-11 DIAGNOSIS — R1314 Dysphagia, pharyngoesophageal phase: Secondary | ICD-10-CM

## 2012-09-11 NOTE — Patient Instructions (Addendum)
Dysphagia Swallowing problems (dysphagia) occur when solids and liquids seem to stick in your throat on the way down to your stomach, or the food takes longer to get to the stomach. Other symptoms (problems) include regurgitating (burping) up food, noises coming from the throat, chest discomfort with swallowing, and a feeling of fullness in the throat when swallowing. When blockage in the throat is complete it may be associated with drooling. CAUSES There are many causes of swallowing difficulties and the following is generalized information regarding a number of reasons for this problem. Problems with swallowing may occur because of problems with the muscles. The food cannot be propelled in the usual manner into the stomach. There may be ulcers, scar tissue, or inflammation (soreness) in the esophagus (the food tube from the mouth to the stomach) which blocks food from passing normally into the stomach. Causes of inflammation include acid reflux from the stomach into the esophagus. Inflammation can also be caused by the herpes simplex virus, Candida (yeast), radiation (as with treatment of cancer), or inflammation from medicines not taken with adequate fluids to wash them down into the stomach. There may be nerve problems so signals cannot be sent adequately telling the muscles of the esophagus to contract and move the food along. Achalasia is a rare disorder of the esophagus in which muscular contractions of the esophagus are uncoordinated. Globus hystericus is a relatively common problem in young females in which there is a sense of an obstruction or difficulty in swallowing, but in which no abnormalities can be found. This problem usually improves over time with reassurance and testing to rule out other causes. DIAGNOSIS A number of tests will help your caregiver know what is the cause of your swallowing problems. These tests may include a barium swallow in which X-rays are taken while you are drinking a  liquid that outlines the lining of the esophagus on X-ray. If the stomach and small bowel are also studied in this manner it is called an upper gastrointestinal exam (UGI). Endoscopy may be done in which your caregiver examines your throat, esophagus, stomach, and small bowel with a small, flexible scope. Motility studies which measure the effectiveness and coordination of the muscular contractions of the esophagus may also be done. TREATMENT The treatment of swallowing problems are many, varying from medicines to surgical treatment. The treatment varies with the type of problem found. Your caregiver will discuss your results and treatment with you. If swallowing problems are severe the long-term problems which may occur include: malnutrition, pneumonia (from food going into the breathing tubes called trachea and bronchi), and an increase in tumors (lumps) of the esophagus. SEEK IMMEDIATE MEDICAL CARE IF:  Food or another object becomes lodged in your throat or esophagus and will not move. Document Released: 05/21/2000 Document Revised: 11/23/2011 Document Reviewed: 01/10/2008 [facility] Patient Information 2013 [place].

## 2012-09-11 NOTE — Assessment & Plan Note (Signed)
?   Stricture, achalasia or other.  Will check barium swallow.

## 2012-09-11 NOTE — Progress Notes (Signed)
  Subjective:    Patient ID: SYLVESTER ROMBERGER, male    DOB: Oct 17, 1932, 79 y.o.   MRN: [id number]  HPI   Has developed dysphagia.  Yesterday ate a burrito and could not get it down.  Noted choking and spit up and was unable to breathe.  Someone where he was eating gave him the heimlich x 3 and this improved.  Noted that he initially had this problem start 2 years ago.  Better with fluid after it gets stuck, but frequently has to throw up and then the issue resolves itself.   Review of Systems  Constitutional: Negative for fever, chills and unexpected weight change.  Respiratory: Positive for choking.   Cardiovascular: Negative for chest pain.  Gastrointestinal: Negative for nausea, abdominal pain and constipation.       Colonoscopy nml except benign polyp       Objective:   Physical Exam  Vitals reviewed. Constitutional: He appears well-developed and well-nourished.  BP was 130/76 and 121/73 this am at home on his meter.  HENT:  Head: Normocephalic and atraumatic.  Eyes: No scleral icterus.  Cardiovascular: Normal rate and regular rhythm.   Pulmonary/Chest: Effort normal.  Abdominal: Soft. He exhibits no distension. There is tenderness (epigastrum).          Assessment & Plan:

## 2012-09-14 ENCOUNTER — Ambulatory Visit (HOSPITAL_COMMUNITY)
Admission: RE | Admit: 2012-09-14 | Discharge: 2012-09-14 | Disposition: A | Discharge status: Home / Self Care | Payer: Medicare Other | Source: Ambulatory Visit | Attending: Family Medicine | Admitting: Family Medicine

## 2012-09-14 DIAGNOSIS — R1310 Dysphagia, unspecified: Secondary | ICD-10-CM | POA: Insufficient documentation

## 2012-09-14 DIAGNOSIS — R1314 Dysphagia, pharyngoesophageal phase: Secondary | ICD-10-CM

## 2012-09-14 DIAGNOSIS — K449 Diaphragmatic hernia without obstruction or gangrene: Secondary | ICD-10-CM | POA: Insufficient documentation

## 2012-09-15 ENCOUNTER — Encounter: Payer: Self-pay | Admitting: Family Medicine

## 2012-09-15 ENCOUNTER — Other Ambulatory Visit: Payer: Self-pay | Admitting: Family Medicine

## 2012-09-15 ENCOUNTER — Encounter: Payer: Self-pay | Admitting: Gastroenterology

## 2012-09-15 ENCOUNTER — Other Ambulatory Visit (HOSPITAL_COMMUNITY): Payer: Self-pay | Admitting: Family Medicine

## 2012-09-15 DIAGNOSIS — R1314 Dysphagia, pharyngoesophageal phase: Secondary | ICD-10-CM

## 2012-09-15 DIAGNOSIS — R1310 Dysphagia, unspecified: Secondary | ICD-10-CM

## 2012-09-15 NOTE — Progress Notes (Signed)
Appt have been made and pt made aware and is agreeable. Fleeger, Salome Spotted

## 2012-09-19 ENCOUNTER — Encounter (HOSPITAL_COMMUNITY): Payer: Medicare Other

## 2012-09-20 ENCOUNTER — Ambulatory Visit (HOSPITAL_COMMUNITY)
Admission: RE | Admit: 2012-09-20 | Discharge: 2012-09-20 | Disposition: A | Discharge status: Home / Self Care | Payer: Medicare Other | Source: Ambulatory Visit | Attending: Family Medicine | Admitting: Family Medicine

## 2012-09-20 DIAGNOSIS — R1311 Dysphagia, oral phase: Secondary | ICD-10-CM | POA: Insufficient documentation

## 2012-09-20 DIAGNOSIS — R1310 Dysphagia, unspecified: Secondary | ICD-10-CM

## 2012-09-20 DIAGNOSIS — R1314 Dysphagia, pharyngoesophageal phase: Secondary | ICD-10-CM

## 2012-09-20 DIAGNOSIS — R1319 Other dysphagia: Secondary | ICD-10-CM | POA: Insufficient documentation
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
## 2012-09-21 NOTE — Procedures (Signed)
SLP reviewed and agree with student findings.   [place], [place] [phone]

## 2012-09-25 ENCOUNTER — Encounter: Payer: Self-pay | Admitting: *Deleted

## 2012-09-28 ENCOUNTER — Encounter: Payer: Self-pay | Admitting: *Deleted

## 2012-10-05 ENCOUNTER — Ambulatory Visit (INDEPENDENT_AMBULATORY_CARE_PROVIDER_SITE_OTHER): Payer: Medicare Other | Admitting: Gastroenterology

## 2012-10-05 ENCOUNTER — Encounter: Payer: Self-pay | Admitting: Gastroenterology

## 2012-10-05 ENCOUNTER — Telehealth: Payer: Self-pay | Admitting: Gastroenterology

## 2012-10-05 VITALS — BP 162/80 | HR 70 | Ht 71.75 in | Wt 199.0 lb

## 2012-10-05 DIAGNOSIS — K219 Gastro-esophageal reflux disease without esophagitis: Secondary | ICD-10-CM

## 2012-10-05 DIAGNOSIS — R1310 Dysphagia, unspecified: Secondary | ICD-10-CM

## 2012-10-05 MED ORDER — DEXLANSOPRAZOLE 60 MG PO CPDR: 60.0000 mg | DELAYED_RELEASE_CAPSULE | Freq: Every day | ORAL | Status: DC

## 2012-10-05 NOTE — Patient Instructions (Addendum)
You have been scheduled for an endoscopy with propofol. Please follow written instructions given to you at your visit today. If you use inhalers (even only as needed), please bring them with you on the day of your procedure. Your physician has requested that you go to [email or web address] and enter the access code given to you at your visit today. This web site gives a general overview about your procedure. However, you should still follow specific instructions given to you by our office regarding your preparation for the procedure.  Dysphagia Diet given please follow stage 3 in the book.  We have given you samples of the following medication to take: Dexilant, please take one capsule by mouth once daily. If this works well for you please call back for a prescription.  Information on Esophageal Dilatation is below. ______________________________________________________________________________________________________________________________________________________________________________  Esophageal Dilatation The esophagus is the long, narrow tube which carries food and liquid from the mouth to the stomach. Esophageal dilatation is the technique used to stretch a blocked or narrowed portion of the esophagus. This procedure is used when a part of the esophagus has become so narrow that it becomes difficult, painful or even impossible to swallow. This is generally an uncomplicated form of treatment. When this is not successful, chest surgery may be required. This is a much more extensive form of treatment with a longer recovery time. CAUSES  Some of the more common causes of blockage or strictures of the esophagus are:  Narrowing from longstanding inflammation (soreness and redness) of the lower esophagus. This comes from the constant exposure of the lower esophagus to the acid which bubbles up from the stomach. Over time this causes scarring and narrowing of the lower esophagus.  Hiatal hernia in  which a small part of the stomach bulges (herniates) up through the diaphragm. This can cause a gradual narrowing of the end of the esophagus.  Schatzki's Ring is a narrow ring of benign (non-cancerous) fibrous tissue which constricts the lower esophagus. The reason for this is not known.  Scleroderma is a connective tissue disorder that affects the esophagus and makes swallowing difficult.  Achalasia is an absence of nerves to the lower esophagus and to the esophageal sphincter. This is the circular muscle between the stomach and esophagus that relaxes to allow food into the stomach. After swallowing, it contracts to keep food in the stomach. This absence of nerves may be congenital (present since birth). This can cause irregular spasms of the lower esophageal muscle. This spasm does not open up to allow food and fluid through. The result is a persistent blockage with subsequent slow trickling of the esophageal contents into the stomach.  Strictures may develop from swallowing materials which damage the esophagus. Some examples are strong acids or alkalis such as lye.  Growths such as benign (non-cancerous) and malignant (cancerous) tumors can block the esophagus.  Heredity (present since birth) causes. DIAGNOSIS  Your caregiver often suspects this problem by taking a medical history. They will also do a physical exam. They can then prove their suspicions using X-rays and endoscopy. Endoscopy is an exam in which a tube like a small flexible telescope is used to look at your esophagus.  TREATMENT There are different stretching (dilating) techniques which can be used. Simple bougie dilatation may be done in the office. This usually takes only a couple minutes. A numbing (anesthetic) spray of the throat is used. Endoscopy, when done, is done in an endoscopy suite, under mild sedation. When fluoroscopy is used, the procedure  is performed in X-ray. Other techniques require a little longer time. Recovery  is usually quick. There is no waiting time to begin eating and drinking to test success of the treatment. Following are some of the methods used. Narrowing of the esophagus is treated by making it bigger. Commonly this is a mechanical problem which can be treated with stretching. This can be done in different ways. Your caregiver will discuss these with you. Some of the means used are:  A series of graduated (increasing thickness) flexible dilators can be used. These are weighted tubes passed through the esophagus into the stomach. The tubes used become progressively larger until the desired stretched size is reached. Graduated dilators are a simple and quick way of opening the esophagus. No visualization is required.  Another method is the use of endoscopy to place a flexible wire across the stricture. The endoscope is removed and the wire left in place. A dilator with a hole through it from end to end is guided down the esophagus and across the stricture. One or more of these dilators are passed over the wire. At the end of the exam, the wire is removed. This type of treatment may be performed in the X-ray department under fluoroscopy. An advantage of this procedure is the examiner is visualizing the end opening in the esophagus.  Stretching of the esophagus may be done using balloons. Deflated balloons are placed through the endoscope and across the stricture. This type of balloon dilatation is often done at the time of endoscopy or fluoroscopy. Flexible endoscopy allows the examiner to directly view the stricture. A balloon is inserted in the deflated form into the area of narrowing. It is then inflated with air to a certain pressure that is pre-set for a given circumference. When inflated, it becomes sausage shaped, stretched, and makes the stricture larger.  Achalasia requires a longer larger balloon-type dilator. This is frequently done under X-ray control. In this situation, the spastic muscle  fibers in the lower esophagus are stretched. All of the above procedures make the passage of food and water into the stomach easier. They also make it easier for stomach contents to reflux back into the esophagus. Special medications may be used following the procedure to help prevent further stricturing. Proton-pump inhibitor medications are good at decreasing the amount of acid in the stomach juice. When stomach juice refluxes into the esophagus, the juice is no longer as acidic and is less likely to burn or scar the esophagus. RISKS AND COMPLICATIONS Esophageal dilatation is usually performed effectively and without problems. Some complications that can occur are:  A small amount of bleeding almost always happens where the stretching takes place. If this is too excessive it may require more aggressive treatment.  An uncommon complication is perforation (making a hole) of the esophagus. The esophagus is thin. It is easy to make a hole in it. If this happens, an operation may be necessary to repair this.  A small, undetected perforation could lead to an infection in the chest. This can be very serious. HOME CARE INSTRUCTIONS   If you received sedation for your procedure, do not drive, make important decisions, or perform any activities requiring your full coordination. Do not drink alcohol, take sedatives, or use any mind altering chemicals unless instructed by your caregiver.  You may use throat lozenges or warm salt water gargles if you have throat discomfort  You can begin eating and drinking normally on return home unless instructed otherwise. Do not  purposely try to force large chunks of food down to test the benefits of your procedure.  Mild discomfort can be eased with sips of ice water.  Medications for discomfort may or may not be needed. SEEK IMMEDIATE MEDICAL CARE IF:   You begin vomiting up blood.  You develop black tarry stools  You develop chills or an unexplained  temperature of over 101 F (38.3 C)  You develop chest or abdominal pain.  You develop shortness of breath or feel lightheaded or faint.  Your swallowing is becoming more painful, difficult, or you are unable to swallow. MAKE SURE YOU:   Understand these instructions.  Will watch your condition.  Will get help right away if you are not doing well or get worse. Document Released: 07/15/2005 Document Revised: 08/16/2011 Document Reviewed: 09/01/2005 [facility] Patient Information 2013 [place].
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
## 2012-10-05 NOTE — Telephone Encounter (Signed)
PATIENT NOTIFIED [id number]

## 2012-10-11 ENCOUNTER — Encounter: Payer: Medicare Other | Admitting: Gastroenterology

## 2012-10-11 ENCOUNTER — Telehealth: Payer: Self-pay | Admitting: *Deleted

## 2012-10-11 MED ORDER — DEXLANSOPRAZOLE 60 MG PO CPDR: 60.0000 mg | DELAYED_RELEASE_CAPSULE | Freq: Every day | ORAL | Status: DC

## 2012-10-11 NOTE — Telephone Encounter (Signed)
Was given samples of Dexilant, now patient request Dexilant RX per [employer].  RX was sent.

## 2012-10-13 ENCOUNTER — Telehealth: Payer: Self-pay | Admitting: *Deleted

## 2012-10-13 MED ORDER — OMEPRAZOLE 40 MG PO CPDR: 40.0000 mg | DELAYED_RELEASE_CAPSULE | Freq: Every day | ORAL | Status: DC

## 2012-10-13 NOTE — Telephone Encounter (Signed)
[facility] sent a fax stating Dexilant is to expensive and patient cannot afford it. Send Prilosec or Protonix. Sent Omeprazole.

## 2012-10-20 ENCOUNTER — Telehealth: Payer: Self-pay | Admitting: Gastroenterology

## 2012-10-20 MED ORDER — PANTOPRAZOLE SODIUM 40 MG PO TBEC: 40.0000 mg | DELAYED_RELEASE_TABLET | Freq: Every day | ORAL | Status: DC

## 2012-10-20 NOTE — Telephone Encounter (Signed)
CALLED PATIENT BACK AND PATIENT SAID THAT HE CANNOT TAKE OMEPRAZOLE BECAUSE IT IS ON THE DO NOT TAKE LIST FOR MEDICATIONS THAT HE LOOKED UP. PATIENT SAID THAT OMEPRAZOLE IS USED TO MAKE PLASTICS WITH AND MEN OVER A CERTAIN AGE SHOULD NOT TAKE IT. PATIENT SAID FDA AND DRUG REPS FOR THE MEDICATION DON'T CARE ABOUT PATIENTS OR SIDE EFFECTS.  I ADVISED PATIENT THAT I CAN SEND PROTONIX AND PATIENT SAID THAT HE WILL HAVE TO LOOK IT UP FIRST. PATIENT WILL CALL ME BACK IF HE CAN TAKE PROTONIX.

## 2012-10-20 NOTE — Telephone Encounter (Signed)
RX sent

## 2012-10-25 ENCOUNTER — Encounter: Payer: Self-pay | Admitting: Gastroenterology

## 2012-10-25 ENCOUNTER — Ambulatory Visit (AMBULATORY_SURGERY_CENTER): Payer: Medicare Other | Admitting: Gastroenterology

## 2012-10-25 ENCOUNTER — Other Ambulatory Visit: Payer: Self-pay | Admitting: Gastroenterology

## 2012-10-25 VITALS — BP 146/91 | HR 74 | Temp 97.9°F | Resp 13 | Ht 71.0 in | Wt 199.0 lb

## 2012-10-25 DIAGNOSIS — R1310 Dysphagia, unspecified: Secondary | ICD-10-CM

## 2012-10-25 DIAGNOSIS — K219 Gastro-esophageal reflux disease without esophagitis: Secondary | ICD-10-CM

## 2012-10-25 DIAGNOSIS — K222 Esophageal obstruction: Secondary | ICD-10-CM

## 2012-10-25 LAB — GLUCOSE, CAPILLARY: Glucose-Capillary: 129 mg/dL — ABNORMAL HIGH (ref 70–99)

## 2012-10-25 MED ORDER — SODIUM CHLORIDE 0.9 % IV SOLN: 500.0000 mL | INTRAVENOUS | Status: DC

## 2012-10-25 NOTE — Progress Notes (Signed)
Patient did not experience any of the following events: a burn prior to discharge; a fall within the facility; wrong site/side/patient/procedure/implant event; or a hospital transfer or hospital admission upon discharge from the facility. (G8907) Patient did not have preoperative order for IV antibiotic SSI prophylaxis. ([id number])  

## 2012-10-25 NOTE — Progress Notes (Signed)
Called to room to assist during endoscopic procedure.  Patient ID and intended procedure confirmed with present staff. Received instructions for my participation in the procedure from the performing physician.  

## 2012-10-25 NOTE — Op Note (Signed)
[place]  [address] [place], [address]   ENDOSCOPY PROCEDURE REPORT  PATIENT: Richard, Moses  MR#: [id number] BIRTHDATE: [birth year] , 80  yrs. old GENDER: Male ENDOSCOPIST:David Consuello Masse, MD, [facility] REFERRED BY: Linna Darner, M.D. PROCEDURE DATE:  10/25/2012 PROCEDURE:   EGD, diagnostic ASA CLASS:    Class III INDICATIONS: Dysphagia. MEDICATION: propofol (Diprivan) 100mg  IV TOPICAL ANESTHETIC:  DESCRIPTION OF PROCEDURE:   After the risks and benefits of the procedure were explained, informed consent was obtained.  The LB [id number]  endoscope was introduced through the mouth  and advanced to the second portion of the duodenum .  The instrument was slowly withdrawn as the mucosa was fully examined.      DUODENUM: The duodenal mucosa showed no abnormalities.  STOMACH: The mucosa of the stomach appeared normal.  ESOPHAGUS: A stricture was found at the gastroesophageal junction. The stenosis was traversable with the endoscope.Dilated #78F Maloney dilator...no heme or pain..SEE PICTURES>     Retroflexed views revealed a 4-5 cm. hiatal hernia.    The scope was then withdrawn from the patient and the procedure completed.  COMPLICATIONS: There were no complications.   ENDOSCOPIC IMPRESSION: 1.   The duodenal mucosa showed no abnormalities 2.   The mucosa of the stomach appeared normal 3.   Stricture was found at the gastroesophageal junction ..chronic GERD.Marland Kitchendilated with [facility] dilator.  RECOMMENDATIONS: 1.  Continue current medications 2.  Dilatations PRN 3.  Clear liquids for 6 hours, call for chest pain.  Soft diet today, resume regular diet tomorrow.    _______________________________ eSignedSable Feil, MD, [facility] 10/25/2012 2:16 PM      PATIENT NAME:  Richard, Moses MR#: [id number]

## 2012-10-25 NOTE — Patient Instructions (Addendum)

## 2012-10-26 ENCOUNTER — Telehealth: Payer: Self-pay

## 2012-10-26 NOTE — Telephone Encounter (Signed)
  Follow up Call-  Call back number 10/25/2012  Post procedure Call Back phone  # [phone]  Permission to leave phone message Yes     Patient questions:  Do you have a fever, pain , or abdominal swelling? no Pain Score  0 *  Have you tolerated food without any problems? yes  Have you been able to return to your normal activities? no  Do you have any questions about your discharge instructions: Diet   no Medications  no Follow up visit  no  Do you have questions or concerns about your Care? no  Actions: * If pain score is 4 or above: No action needed, pain <4.  No problems per the pt.  Maw

## 2012-11-07 ENCOUNTER — Encounter: Payer: Self-pay | Admitting: Family Medicine

## 2012-11-07 ENCOUNTER — Ambulatory Visit (INDEPENDENT_AMBULATORY_CARE_PROVIDER_SITE_OTHER): Payer: Medicare Other | Admitting: Family Medicine

## 2012-11-07 VITALS — BP 170/98 | HR 116 | Temp 98.9°F | Ht 71.0 in | Wt 193.0 lb

## 2012-11-07 DIAGNOSIS — R972 Elevated prostate specific antigen [PSA]: Secondary | ICD-10-CM

## 2012-11-07 DIAGNOSIS — I10 Essential (primary) hypertension: Secondary | ICD-10-CM

## 2012-11-07 DIAGNOSIS — L509 Urticaria, unspecified: Secondary | ICD-10-CM

## 2012-11-07 DIAGNOSIS — R300 Dysuria: Secondary | ICD-10-CM

## 2012-11-07 LAB — POCT URINALYSIS DIPSTICK
Bilirubin, UA: NEGATIVE
Blood, UA: NEGATIVE
Glucose, UA: NEGATIVE
Leukocytes, UA: NEGATIVE
Nitrite, UA: NEGATIVE

## 2012-11-07 LAB — PSA: PSA: 6.39 ng/mL — ABNORMAL HIGH (ref <=4.00)

## 2012-11-07 MED ORDER — PHENAZOPYRIDINE HCL 100 MG PO TABS: 100.0000 mg | ORAL_TABLET | Freq: Three times a day (TID) | ORAL | Status: DC | PRN

## 2012-11-07 NOTE — Assessment & Plan Note (Signed)
States "always elevated at doctors." States his blood pressure runs one teens to 120s at home and at nearby pharmacy. States he refuses any high blood pressure medicines prescribed. Defer to PCP. May benefit from home ambulatory monitoring

## 2012-11-07 NOTE — Progress Notes (Signed)
Subjective:    Richard Moses is a 79 y.o. male who presents to [facility] today with complaints of UTI and rash:  1.  dysuria:  Present for about 2-3 days.  Began with itching in urinary tract, now overt dysuria, described as burning.  He also endorses weakening stream. He has had some hesitancy, which has gradually worsened over past week or so. He had episode of gross blood about a week ago that lasted for several days but resolved and has been resolved for least a week. No fevers or chills. No back pain. No abdominal pain.  2.  Rash:  He awoke this morning and was "covered hives." He use an over-the-counter moisturizing lotion that contained a limit him but states this helped. He did not try Benadryl. He denies any any new foods and had the same breakfast of oatmeal he has had for many weeks. His hives resolved about mid morning but he wanted to bring this up today. No new soaps or shampoos. No difficulty with breathing, airway, swallowing. No tingling or lips.   The following portions of the patient's history were reviewed and updated as appropriate: allergies, current medications, past medical history, family and social history, and problem list. Patient is a nonsmoker.    PMH reviewed.  Past Medical History  Diagnosis Date  . Diabetes mellitus   . Hypertension   . Hyperlipidemia   . Neck pain on left side 2012  . Chronic left-sided headaches   . MRI of brain abnormal 09/29/10    Abnormal MRI of brain, demonstrating mild atrophy  . 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 Abnormal MRI, cervical spine 09/29/10    Mildly abnormal MRI cervical spine demonstrating mild spondylosis and disc bulging from C4-5 down to C7-T1.  No spinal stenosis or foraminal narrowing  . Hypothyroidism 09/29/10    Untreated. Patient not interested in Rx.   . Colon polyp 2007  . External hemorrhoids without mention of complication [id number]   Past Surgical History  Procedure Laterality Date  . Melanoma  excision      left side of neck  . Rhinoplasty      x2     Medications reviewed. Current Outpatient Prescriptions  Medication Sig Dispense Refill  . Misc Natural Products (PROSTATE SUPPORT PO) Take 1 capsule by mouth daily.      . Multiple Vitamin (MULTIVITAMIN) tablet Take 1 tablet by mouth daily.      . pantoprazole (PROTONIX) 40 MG tablet Take 1 tablet (40 mg total) by mouth daily.  30 tablet  1   No current facility-administered medications for this visit.    ROS as above otherwise neg.  No chest pain, palpitations, SOB, Fever, Chills, Abd pain, N/V/D.   Objective:   Physical Exam BP 170/98  Pulse 116  Temp(Src) 98.9 F (37.2 C) (Oral)  Ht 5\' 11"  (1.803 m)  Wt 193 lb (87.544 kg)  BMI 26.93 kg/m2 Gen:  Alert, cooperative patient who appears stated age in no acute distress.  Vital signs reviewed. HEENT: EOMI,  MMM Skin:  Some ingrown hairs over his chestbut otherwise no rash noted Abd:  Soft/nondistended/nontender.  Good bowel sounds throughout all four quadrants.  No masses noted.  GU:  Uncircumcised male. No tenderness or swelling of testicles bilaterally. No erythema. No hernias noted. Rectal: Grossly enlarged prostate. This is nodular and hard. Right-sided prostate is larger than the left and prostate is asymmetric.   Results for orders placed in visit  on 11/07/12 (from the past 72 hour(s))  POCT URINALYSIS DIPSTICK     Status: None   Collection Time    11/07/12  8:45 AM      Result Value Range   Color, UA YELLOW     Clarity, UA CLEAR     Glucose, UA NEG     Bilirubin, UA NEG     Ketones, UA NEG     Spec Grav, UA 1.020     Blood, UA NEG     pH, UA 6.5     Protein, UA NEG     Urobilinogen, UA 0.2     Nitrite, UA NEG     Leukocytes, UA Negative

## 2012-11-07 NOTE — Patient Instructions (Signed)
We will test your PSA today and let you know the results.    I have sent in the Pyridium.  It can turn your urine orange.

## 2012-11-07 NOTE — Assessment & Plan Note (Signed)
PSA has been elevated in the past. Greater than 5 2010. Long discussion (greater than 15 minutes) with patient today regarding past elevation of PSA, findings on physical exam, his feelings of dysuria and hesitancy.  Patient relates his PSA checked today as he has some concerns about prostate cancer. We did spend a lot of time discussing surgical versus nonsurgical options. He believes he rather not have any surgeries but "would like to know" if his PSA is continuing to rise. I discussed with them that his UA was clinically negative and I do not think he has a UTI currently. I'll send and some Pyridium forhim for relief. He states this is helped him before in the past most recently about several months ago

## 2012-11-07 NOTE — Assessment & Plan Note (Signed)
Unclear etiology. Benadryl if returns.  FU if returns and no improvement.   No red flags.

## 2012-11-12 ENCOUNTER — Telehealth: Payer: Self-pay | Admitting: Family Medicine

## 2012-11-12 NOTE — Telephone Encounter (Signed)
Would you guys mind calling Richard Moses to let him know the results of his PSA test?  It is 6.4.  This is only minimally elevated from 5.4 from three years ago, so this is good news.  We should make sure that his urinary symptoms have improved.  If he has lots of questions, he should come back in or wait until I'm back next week, I can talk with him then.  Thanks,  Merry Proud

## 2012-11-13 NOTE — Telephone Encounter (Signed)
Pt notified of results and states urinary symptoms have improved.  Rubye Strohmeyer, Loralyn [place], [place]

## 2013-01-04 ENCOUNTER — Ambulatory Visit (INDEPENDENT_AMBULATORY_CARE_PROVIDER_SITE_OTHER): Payer: Medicare Other | Admitting: Family Medicine

## 2013-01-04 ENCOUNTER — Encounter: Payer: Self-pay | Admitting: Family Medicine

## 2013-01-04 VITALS — BP 147/80 | HR 86 | Ht 71.0 in | Wt 201.7 lb

## 2013-01-04 DIAGNOSIS — M7989 Other specified soft tissue disorders: Secondary | ICD-10-CM

## 2013-01-04 LAB — CBC WITH DIFFERENTIAL/PLATELET
Basophils Absolute: 0 10*3/uL (ref 0.0–0.1)
Basophils Relative: 1 % (ref 0–1)
Eosinophils Relative: 4 % (ref 0–5)
HCT: 39.6 % (ref 39.0–52.0)
Hemoglobin: 13.6 g/dL (ref 13.0–17.0)
MCHC: 34.3 g/dL (ref 30.0–36.0)
MCV: 95.7 fL (ref 78.0–100.0)
Monocytes Absolute: 0.6 10*3/uL (ref 0.1–1.0)
Monocytes Relative: 13 % — ABNORMAL HIGH (ref 3–12)
RDW: 13.3 % (ref 11.5–15.5)

## 2013-01-04 NOTE — Patient Instructions (Addendum)
Keep legs elevated throughout the day to decrease swelling, may wear compression socks/stockings when on your feet to decrease swelling, will check labwork to check kidneys/liver.   Return to office as needed if swelling persists. Please make an appointment with her primary care physician to followup on your chronic medical problems.

## 2013-01-04 NOTE — Assessment & Plan Note (Signed)
Likely secondary to venous insufficiency, no evidence of heart failure based on history/physical exam, TSH within the past 12 months was normal, will check CMP/CBC to rule out liver/kidney issue or anemia -encouraged to wear compression socks/stockings when ambulatory -If symptoms persist worsen consider additional workup/perscription compression stockings

## 2013-01-04 NOTE — Progress Notes (Signed)
  Subjective:    Patient ID: Richard Moses, male    DOB: [birth year], 79 y.o.   MRN: [id number]  HPI  86 male presents with swelling of bilateral legs (left>right). This is present for the past 2-3 weeks, patient said swelling is worse throughout the day, when he wakes up in the morning he has minimal lower extremity edema, patient had previous history of right lower extremity edema that resolved spontaneously, denies use of current compression stocking, patient is very active throughout the day he does note some mild Achilles pain on the left side, denies calf pain, tenderness, or redness. Patient denies pain, tenderness, redness of the right calf. Patient denies orthopnea or shortness of breath at night. Denies paroxysmal nocturnal dyspnea. Patient denies swelling of other extremities  Review of Systems  Constitutional: Negative for fever and chills.  Respiratory: Negative for cough, chest tightness and shortness of breath.   Cardiovascular: Positive for leg swelling. Negative for chest pain.       Objective:   Physical Exam General: Alert and oriented x3, no acute distress HEENT: Pupils are equal round react to light, extraocular movements are intact, moist mucous membranes Cardiac: Regular rate and rhythm, S1 and S2 present, no gallops, no murmurs, no JVD Respiratory: Clear to auscultation bilaterally, no wheezes, crackles, rales Extremity: 2+ edema of the left lower extremity to mid shin, 1+ edema of the right lower tremor, bilateral dorsalis pedis pulses were 2+, posterior tibialis pulses 2+, no evidence of venous stasis or ulceration Neural: Lower extremity strength was 5 out of 5       Assessment & Plan:

## 2013-01-05 ENCOUNTER — Encounter: Payer: Self-pay | Admitting: Family Medicine

## 2013-01-05 LAB — CMP AND LIVER
ALT: 18 U/L (ref 0–53)
AST: 22 U/L (ref 0–37)
Albumin: 4.4 g/dL (ref 3.5–5.2)
Alkaline Phosphatase: 62 U/L (ref 39–117)
Potassium: 4.2 mEq/L (ref 3.5–5.3)
Sodium: 141 mEq/L (ref 135–145)
Total Protein: 6.8 g/dL (ref 6.0–8.3)

## 2013-02-05 DIAGNOSIS — M25561 Pain in right knee: Secondary | ICD-10-CM

## 2013-02-05 HISTORY — DX: Pain in right knee: M25.561

## 2013-02-28 ENCOUNTER — Ambulatory Visit: Payer: Medicare Other | Admitting: Family Medicine

## 2013-02-28 ENCOUNTER — Encounter: Payer: Self-pay | Admitting: Family Medicine

## 2013-02-28 ENCOUNTER — Ambulatory Visit (INDEPENDENT_AMBULATORY_CARE_PROVIDER_SITE_OTHER): Payer: Medicare Other | Admitting: Family Medicine

## 2013-02-28 VITALS — BP 146/80 | HR 84 | Temp 97.7°F | Wt 198.0 lb

## 2013-02-28 DIAGNOSIS — M7660 Achilles tendinitis, unspecified leg: Secondary | ICD-10-CM

## 2013-02-28 DIAGNOSIS — M6789 Other specified disorders of synovium and tendon, multiple sites: Secondary | ICD-10-CM

## 2013-02-28 DIAGNOSIS — I10 Essential (primary) hypertension: Secondary | ICD-10-CM

## 2013-02-28 DIAGNOSIS — E1149 Type 2 diabetes mellitus with other diabetic neurological complication: Secondary | ICD-10-CM

## 2013-02-28 DIAGNOSIS — M7989 Other specified soft tissue disorders: Secondary | ICD-10-CM

## 2013-02-28 MED ORDER — IBUPROFEN 600 MG PO TABS: 600.0000 mg | ORAL_TABLET | Freq: Three times a day (TID) | ORAL | Status: DC | PRN

## 2013-02-28 MED ORDER — MELOXICAM 15 MG PO TABS: 7.5000 mg | ORAL_TABLET | Freq: Every day | ORAL | Status: DC

## 2013-02-28 MED ORDER — NITROGLYCERIN 0.1 MG/HR TD PT24: MEDICATED_PATCH | TRANSDERMAL | Status: DC

## 2013-02-28 NOTE — Assessment & Plan Note (Signed)
Unilateral.  Measurements in PE LE doppler to evaluate for DVT Likely venous insufficiency

## 2013-02-28 NOTE — Assessment & Plan Note (Signed)
Achilles tendonopathy.  Since getting doppler for LE DVT eval will also have them eval achilles tendon for swelling/calcifications Nitro patch Exercises Meloxicam 2 wks F/u in 4 wks.

## 2013-02-28 NOTE — Assessment & Plan Note (Signed)
Much improved over previous A1c Pt does not want to try medications Continue to encourage diet and exercise

## 2013-02-28 NOTE — Assessment & Plan Note (Signed)
No change in regimen At goal according to JNC 8

## 2013-02-28 NOTE — Progress Notes (Signed)
Richard Moses is a 79 y.o. male who presents to [facility] today for Left Leg swelling  Left leg swelling: seen previously in July by Dr. Ree Kida. Rest and elevation w/o improvement. Worse w/ walking. nml caliber in the am but swells over the course of the day. Denies any leg pain. Denies CP, SOB, palpitations. No h/o clotting or smoking. Present for 6 motnhs  Achilles: became painful 5-6 months ago. Denies trauma. No insighting events. Worse w/ walking. Better w/ rest. Has not tried any medications. Dull to burning pain. Intermittent.   HTN: denies CP, SOB, palpitations. Taking medications as prescribed. nml at home.   Sore R big toe: felll 1 wk ago after stubbing toe on [place] in [place].   Taking special vitamins for the prostate. Would not like further evaluation other than a yearly PSA. No intervention for the prostate elevation, per pt. Urinary stream is nml..   The following portions of the patient's history were reviewed and updated as appropriate: allergies, current medications, past medical history, family and social history, and problem list.  Patient is a nonsmoker.  Past Medical History  Diagnosis Date  . Diabetes mellitus   . Hypertension   . Hyperlipidemia   . Neck pain on left side 2012  . Chronic left-sided headaches   . MRI of brain abnormal 09/29/10    Abnormal MRI of brain, demonstrating mild atrophy  . 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 Abnormal MRI, cervical spine 09/29/10    Mildly abnormal MRI cervical spine demonstrating mild spondylosis and disc bulging from C4-5 down to C7-T1.  No spinal stenosis or foraminal narrowing  . Hypothyroidism 09/29/10    Untreated. Patient not interested in Rx.   . Colon polyp 2007  . External hemorrhoids without mention of complication [id number]    ROS as above otherwise neg.    Medications reviewed. Current Outpatient Prescriptions  Medication Sig Dispense Refill  . Misc Natural Products (PROSTATE SUPPORT PO) Take  1 capsule by mouth daily.      . Multiple Vitamin (MULTIVITAMIN) tablet Take 1 tablet by mouth daily.      . pantoprazole (PROTONIX) 40 MG tablet Take 1 tablet (40 mg total) by mouth daily.  30 tablet  1  . phenazopyridine (PYRIDIUM) 100 MG tablet Take 1 tablet (100 mg total) by mouth 3 (three) times daily as needed for pain.  10 tablet  0   No current facility-administered medications for this visit.    Exam: BP 146/80  Pulse 84  Temp(Src) 97.7 F (36.5 C) (Oral)  Wt 198 lb (89.812 kg)  BMI 27.63 kg/m2 Gen: Well NAD HEENT: EOMI,  MMM Lungs: CTABL Nl WOB Heart: RRR no MRG Abd: NABS, NT, ND Exts: R calf circ 36cm L calf circ 38.5cm MSK: FROM both ankles, TTP L achilles w/ small bulge of medial tendon, no catching.    No results found for this or any previous visit (from the past 72 hour(s)).

## 2013-02-28 NOTE — Patient Instructions (Addendum)
You have achilles tendonopathy Please start taking the Motrin for 2 weeks. If this upsets your stomach than stop Please place the nitro patches on your achilles tendon area daily Please do the exercises as outlined Please go to [facility] for the ultrasound Please come back to see me in 4 weeks.   Achilles Tendinitis  with Rehab Achilles tendinitis is a disorder of the Achilles tendon. The Achilles tendon connects the large calf muscles (Gastrocnemius and Soleus) to the heel bone (calcaneus). This tendon is sometimes called the heel cord. It is important for pushing-off and standing on your toes and is important for walking, running, or jumping. Tendinitis is often caused by overuse and repetitive microtrauma. SYMPTOMS  Pain, tenderness, swelling, warmth, and redness may occur over the Achilles tendon even at rest.  Pain with pushing off, or flexing or extending the ankle.  Pain that is worsened after or during activity. CAUSES   Overuse sometimes seen with rapid increase in exercise programs or in sports requiring running and jumping.  Poor physical conditioning (strength and flexibility or endurance).  Running sports, especially training running down hills.  Inadequate warm-up before practice or play or failure to stretch before participation.  Injury to the tendon. PREVENTION   Warm up and stretch before practice or competition.  Allow time for adequate rest and recovery between practices and competition.  Keep up conditioning.  Keep up ankle and leg flexibility.  Improve or keep muscle strength and endurance.  Improve cardiovascular fitness.  Use proper technique.  Use proper equipment (shoes, skates).  To help prevent recurrence, taping, protective strapping, or an adhesive bandage may be recommended for several weeks after healing is complete. PROGNOSIS   Recovery may take weeks to several months to heal.  Longer recovery is expected if symptoms have been  prolonged.  Recovery is usually quicker if the inflammation is due to a direct blow as compared with overuse or sudden strain. RELATED COMPLICATIONS   Healing time will be prolonged if the condition is not correctly treated. The injury must be given plenty of time to heal.  Symptoms can reoccur if activity is resumed too soon.  Untreated, tendinitis may increase the risk of tendon rupture requiring additional time for recovery and possibly surgery. TREATMENT   The first treatment consists of rest anti-inflammatory medication, and ice to relieve the pain.  Stretching and strengthening exercises after resolution of pain will likely help reduce the risk of recurrence. Referral to a physical therapist or athletic trainer for further evaluation and treatment may be helpful.  A walking boot or cast may be recommended to rest the Achilles tendon. This can help break the cycle of inflammation and microtrauma.  Arch supports (orthotics) may be prescribed or recommended by your caregiver as an adjunct to therapy and rest.  Surgery to remove the inflamed tendon lining or degenerated tendon tissue is rarely necessary and has shown less than predictable results. MEDICATION   Nonsteroidal anti-inflammatory medications, such as aspirin and ibuprofen, may be used for pain and inflammation relief. Do not take within 7 days before surgery. Take these as directed by your caregiver. Contact your caregiver immediately if any bleeding, stomach upset, or signs of allergic reaction occur. Other minor pain relievers, such as acetaminophen, may also be used.  Pain relievers may be prescribed as necessary by your caregiver. Do not take prescription pain medication for longer than 4 to 7 days. Use only as directed and only as much as you need.  Cortisone injections  are rarely indicated. Cortisone injections may weaken tendons and predispose to rupture. It is better to give the condition more time to heal than to use  them. HEAT AND COLD  Cold is used to relieve pain and reduce inflammation for acute and chronic Achilles tendinitis. Cold should be applied for 10 to 15 minutes every 2 to 3 hours for inflammation and pain and immediately after any activity that aggravates your symptoms. Use ice packs or an ice massage.  Heat may be used before performing stretching and strengthening activities prescribed by your caregiver. Use a heat pack or a warm soak. SEEK MEDICAL CARE IF:  Symptoms get worse or do not improve in 2 weeks despite treatment.  New, unexplained symptoms develop. Drugs used in treatment may produce side effects. EXERCISES RANGE OF MOTION (ROM) AND STRETCHING EXERCISES - Achilles Tendinitis  These exercises may help you when beginning to rehabilitate your injury. Your symptoms may resolve with or without further involvement from your physician, physical therapist or athletic trainer. While completing these exercises, remember:   Restoring tissue flexibility helps normal motion to return to the joints. This allows healthier, less painful movement and activity.  An effective stretch should be held for at least 30 seconds.  A stretch should never be painful. You should only feel a gentle lengthening or release in the stretched tissue. STRETCH  Gastroc, Standing   Place hands on wall.  Extend right / left leg, keeping the front knee somewhat bent.  Slightly point your toes inward on your back foot.  Keeping your right / left heel on the floor and your knee straight, shift your weight toward the wall, not allowing your back to arch.  You should feel a gentle stretch in the right / left calf. Hold this position for __________ seconds. Repeat __________ times. Complete this stretch __________ times per day. STRETCH  Soleus, Standing   Place hands on wall.  Extend right / left leg, keeping the other knee somewhat bent.  Slightly point your toes inward on your back foot.  Keep your right  / left heel on the floor, bend your back knee, and slightly shift your weight over the back leg so that you feel a gentle stretch deep in your back calf.  Hold this position for __________ seconds. Repeat __________ times. Complete this stretch __________ times per day. STRETCH  Gastrocsoleus, Standing  Note: This exercise can place a lot of stress on your foot and ankle. Please complete this exercise only if specifically instructed by your caregiver.   Place the ball of your right / left foot on a step, keeping your other foot firmly on the same step.  Hold on to the wall or a rail for balance.  Slowly lift your other foot, allowing your body weight to press your heel down over the edge of the step.  You should feel a stretch in your right / left calf.  Hold this position for __________ seconds.  Repeat this exercise with a slight bend in your knee. Repeat __________ times. Complete this stretch __________ times per day.  STRENGTHENING EXERCISES - Achilles Tendinitis These exercises may help you when beginning to rehabilitate your injury. They may resolve your symptoms with or without further involvement from your physician, physical therapist or athletic trainer. While completing these exercises, remember:   Muscles can gain both the endurance and the strength needed for everyday activities through controlled exercises.  Complete these exercises as instructed by your physician, physical therapist or athletic  trainer. Progress the resistance and repetitions only as guided.  You may experience muscle soreness or fatigue, but the pain or discomfort you are trying to eliminate should never worsen during these exercises. If this pain does worsen, stop and make certain you are following the directions exactly. If the pain is still present after adjustments, discontinue the exercise until you can discuss the trouble with your clinician. STRENGTH - Plantar-flexors   Sit with your right / left  leg extended. Holding onto both ends of a rubber exercise band/tubing, loop it around the ball of your foot. Keep a slight tension in the band.  Slowly push your toes away from you, pointing them downward.  Hold this position for __________ seconds. Return slowly, controlling the tension in the band/tubing. Repeat __________ times. Complete this exercise __________ times per day.  STRENGTH - Plantar-flexors   Stand with your feet shoulder width apart. Steady yourself with a wall or table using as little support as needed.  Keeping your weight evenly spread over the width of your feet, rise up on your toes.*  Hold this position for __________ seconds. Repeat __________ times. Complete this exercise __________ times per day.  *If this is too easy, shift your weight toward your right / left leg until you feel challenged. Ultimately, you may be asked to do this exercise with your right / left foot only. STRENGTH  Plantar-flexors, Eccentric  Note: This exercise can place a lot of stress on your foot and ankle. Please complete this exercise only if specifically instructed by your caregiver.   Place the balls of your feet on a step. With your hands, use only enough support from a wall or rail to keep your balance.  Keep your knees straight and rise up on your toes.  Slowly shift your weight entirely to your right / left toes and pick up your opposite foot. Gently and with controlled movement, lower your weight through your right / left foot so that your heel drops below the level of the step. You will feel a slight stretch in the back of your calf at the end position.  Use the healthy leg to help rise up onto the balls of both feet, then lower weight only on the right / left leg again. Build up to 15 repetitions. Then progress to 3 consecutive sets of 15 repetitions.*  After completing the above exercise, complete the same exercise with a slight knee bend (about 30 degrees). Again, build up to 15  repetitions. Then progress to 3 consecutive sets of 15 repetitions.* Perform this exercise __________ times per day.  *When you easily complete 3 sets of 15, your physician, physical therapist or athletic trainer may advise you to add resistance by wearing a backpack filled with additional weight. STRENGTH - Plantar Flexors, Seated   Sit on a chair that allows your feet to rest flat on the ground. If necessary, sit at the edge of the chair.  Keeping your toes firmly on the ground, lift your right / left heel as far as you can without increasing any discomfort in your ankle. Repeat __________ times. Complete this exercise __________ times a day. *If instructed by your physician, physical therapist or athletic trainer, you may add ____________________ of resistance by placing a weighted object on your right / left knee. Document Released: 12/23/2004 Document Revised: 08/16/2011 Document Reviewed: 09/05/2008 [facility] Patient Information 2014 [place], [place].

## 2013-03-05 ENCOUNTER — Ambulatory Visit (INDEPENDENT_AMBULATORY_CARE_PROVIDER_SITE_OTHER): Payer: Medicare Other | Admitting: Family Medicine

## 2013-03-05 ENCOUNTER — Encounter: Payer: Self-pay | Admitting: Family Medicine

## 2013-03-05 ENCOUNTER — Ambulatory Visit (HOSPITAL_COMMUNITY)
Admission: RE | Admit: 2013-03-05 | Discharge: 2013-03-05 | Disposition: A | Discharge status: Home / Self Care | Payer: Medicare Other | Source: Ambulatory Visit | Attending: Family Medicine | Admitting: Family Medicine

## 2013-03-05 VITALS — BP 122/64 | Temp 98.5°F | Wt 196.0 lb

## 2013-03-05 DIAGNOSIS — I82409 Acute embolism and thrombosis of unspecified deep veins of unspecified lower extremity: Secondary | ICD-10-CM

## 2013-03-05 DIAGNOSIS — M7989 Other specified soft tissue disorders: Secondary | ICD-10-CM

## 2013-03-05 DIAGNOSIS — I82402 Acute embolism and thrombosis of unspecified deep veins of left lower extremity: Secondary | ICD-10-CM

## 2013-03-05 DIAGNOSIS — M79609 Pain in unspecified limb: Secondary | ICD-10-CM

## 2013-03-05 MED ORDER — ENOXAPARIN SODIUM 100 MG/ML ~~LOC~~ SOLN: 90.0000 mg | Freq: Two times a day (BID) | SUBCUTANEOUS | Status: DC

## 2013-03-05 MED ORDER — ENOXAPARIN SODIUM 100 MG/ML ~~LOC~~ SOLN: 1.0000 mg/kg | Freq: Two times a day (BID) | SUBCUTANEOUS | Status: DC

## 2013-03-05 MED ORDER — WARFARIN SODIUM 5 MG PO TABS: 5.0000 mg | ORAL_TABLET | Freq: Every day | ORAL | Status: DC

## 2013-03-05 NOTE — Progress Notes (Addendum)
Richard Moses is a 79 y.o. male who presents to [facility] today for dopplers pos for DVT and start of anticoagulation  DVT noted today on vascular studies: Continues to have LE swelling. Worse after ambulation. Denies CP, SOB. Travels to [place] about 4 times per year. Went in June and swelling came on a few weeks later.   Achilles tendon pain: improving after starting ibuprofen and nitro patches. Performing stretching. Only able to cut nitro patches in half. Denies any syncope, lightheadedness, palpitatinos, HA w/ patches  HTN: excellent today. On nitro patch  The following portions of the patient's history were reviewed and updated as appropriate: allergies, current medications, past medical history, family and social history, and problem list.  Patient is a nonsmoker.   Past Medical History  Diagnosis Date  . Diabetes mellitus   . Hypertension   . Hyperlipidemia   . Neck pain on left side 2012  . Chronic left-sided headaches   . MRI of brain abnormal 09/29/10    Abnormal MRI of brain, demonstrating mild atrophy  . 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 Abnormal MRI, cervical spine 09/29/10    Mildly abnormal MRI cervical spine demonstrating mild spondylosis and disc bulging from C4-5 down to C7-T1.  No spinal stenosis or foraminal narrowing  . Hypothyroidism 09/29/10    Untreated. Patient not interested in Rx.   . Colon polyp 2007  . External hemorrhoids without mention of complication [id number]    ROS as above otherwise neg.    Medications reviewed. Current Outpatient Prescriptions  Medication Sig Dispense Refill  . ibuprofen (ADVIL,MOTRIN) 600 MG tablet Take 1 tablet (600 mg total) by mouth every 8 (eight) hours as needed for pain.  30 tablet  0  . Misc Natural Products (PROSTATE SUPPORT PO) Take 1 capsule by mouth daily.      . Multiple Vitamin (MULTIVITAMIN) tablet Take 1 tablet by mouth daily.      . nitroGLYCERIN (NITRO-DUR) 0.1 mg/hr patch Cut patch into quarters and  apply one quarter patch to achilles tendon area. Remove and repeat daily  30 patch  12  . pantoprazole (PROTONIX) 40 MG tablet Take 1 tablet (40 mg total) by mouth daily.  30 tablet  1  . phenazopyridine (PYRIDIUM) 100 MG tablet Take 1 tablet (100 mg total) by mouth 3 (three) times daily as needed for pain.  10 tablet  0   No current facility-administered medications for this visit.    Exam:  BP 122/64  Temp(Src) 98.5 F (36.9 C) (Oral)  Wt 196 lb (88.905 kg)  BMI 27.35 kg/m2 Gen: Well NAD HEENT: EOMI,  MMM   No results found for this or any previous visit (from the past 55 hour(s)).   9/29 11:00AM Addendum Provided patient education on enoxaparin bridge with warfarin, 1 dose given by RN in clinic. Patient given one sample syringe to take home. Per patient, roommate (employee of [employer]), will likely administer doses. Patient returning on Friday for INR check  Lysle Morales, PharmD Candidate and Fredna Dow, PharmD Resident

## 2013-03-05 NOTE — Assessment & Plan Note (Addendum)
DVT noted this morning on dopplers.  Pt requested to come into clinic Lengthy discussion regarding anticoagulation options. Pt deferring Xarelto due to "seeing so many legal/attorney commercials."  Would like a natural homeopathic regimen. Explained that no proven alternative to pharmacotherapy Will start Lovenox and Coumadin. Pt aware of regimen and amenable.  First dose of lovenox given in clinic (lot number [id number] 11/16) No sign of PE today

## 2013-03-05 NOTE — Progress Notes (Signed)
VASCULAR LAB PRELIMINARY  PRELIMINARY  PRELIMINARY  PRELIMINARY  Left lower extremity venous Doppler completed.    Preliminary report:  There is acute, occlusive DVT noted in the mid posterior tibial vein of the left lower extremity.  All other veins appear thrombus free.  There is a Baker's cyst noted in the right popliteal fossa.  Merari Pion, RVT 03/05/2013, 9:12 AM

## 2013-03-05 NOTE — Addendum Note (Signed)
Addended by: Fredna Dow B on: 03/05/2013 11:31 AM   Modules accepted: Orders

## 2013-03-05 NOTE — Patient Instructions (Addendum)
Thank you for coming into clinic today You have a clot in your left leg which can produce a life threatening condition called a Pulmonary Embolism Unfortunately there is no good natural alternative to traditional blood thinning medications Please start the lovenox 90mg  twice daily Please start the Warfarin 5mg  daily. Please come back on Friday to have your INR checked in our lab Please go to the emergency room if you develop severe chest pain or shortness of breath.  Please stop the ibuprofen  Deep Vein Thrombosis A deep vein thrombosis (DVT) is a blood clot that develops in a deep vein. A DVT is a clot in the deep, larger veins of the leg, arm, or pelvis. These are more dangerous than clots that might form in veins near the surface of the body. A DVT can lead to complications if the clot breaks off and travels in the bloodstream to the lungs.  A DVT can damage the valves in your leg veins, so that instead of flowing upwards, the blood pools in the lower leg. This is called post-thrombotic syndrome, and can result in pain, swelling, discoloration, and sores on the leg. Once identified, a DVT can be treated. It can also be prevented in some circumstances. Once you have had a DVT, you may be at increased risk for a DVT in the future. CAUSES Blood clots form in a vein for different reasons. Usually several things contribute to blood clots. Contributing factors include:  The flow of blood slows down.  The inside of the vein is damaged in some way.  The person has a condition that makes blood clot more easily. Some people are more likely than others to develop blood clots. That is because they have more factors that make clots likely. These are called risk factors. Risk factors include:   Older age, especially over 78 years old.  Having a history of blood clots. This means you have had one before. Or, it means that someone else in your family has had blood clots. You may have a genetic tendency to  form clots.  Having major or lengthy surgery. This is especially true for surgery on the hip, knee, or belly (abdomen). Hip surgery is particularly high risk.  Breaking a hip or leg.  Sitting or lying still for a long time. This includes long distance travel, paralysis, or recovery from an illness or surgery.  Cancer, or cancer treatment.  Having a long, thin tube (catheter) placed inside a vein during a medical procedure.  Being overweight (obese).  Pregnancy and childbirth. Hormone changes make the blood clot more easily during pregnancy. The fetus puts pressure on the veins of the pelvis. There is also risk of injury to veins during delivery or a caesarean. The risk is at its highest just after childbirth.  Medicines with the male hormone estrogen. This includes birth control pills and hormone replacement therapy.  Smoking.  Other circulation or heart problems. SYMPTOMS When a clot forms, it can either partially or totally block the blood flow in that vein. Symptoms of a DVT can include:  Swelling of the leg or arm, especially if one side is much worse.  Warmth and redness of the leg or arm, especially if one side is much worse.  Pain in an arm or leg. If the clot is in the leg, symptoms may be more noticeable or worse when standing or walking. The symptoms of a DVT that has traveled to the lungs (pulmonary embolism, PE) usually start suddenly, and  include:  Shortness of breath.  Coughing.  Coughing up blood or blood-tinged phlegm.  Chest pain. The chest pain is often worse with deep breaths.  Rapid heartbeat. Anyone with these symptoms should get emergency medical treatment right away. Call your local emergency services (911 in [place] if you have these symptoms. DIAGNOSIS If a DVT is suspected, your caregiver will take a full medical history and carry out a physical exam. Tests that also may be required include:  Blood tests, including studies of the clotting  properties of the blood.  Ultrasonography to see if you have clots in your legs or lungs.  X-rays to show the flow of blood when dye is injected into the veins (venography).  Studies of your lungs, if you have any chest symptoms. PREVENTION  Exercise the legs regularly. Take a brisk 30 minute walk every day.  Maintain a weight that is appropriate for your height.  Avoid sitting or lying in bed for long periods of time without moving your legs.  Women, particularly those over the age of 49, should consider the risks and benefits of taking estrogen medicines, including birth control pills.  Do not smoke, especially if you take estrogen medicines.  Long distance travel can increase your risk of DVT. You should exercise your legs by walking or pumping the muscles every hour.  In-hospital prevention:  Many of the risk factors above relate to situations that exist with hospitalization, either for illness, injury, or elective surgery.  Your caregiver will assess you for the need for venous thromboembolism prophylaxis when you are admitted to the hospital. If you are having surgery, your surgeon will assess you the day of or day after surgery.  Prevention may include medical and nonmedical measures. TREATMENT Treatment for DVT helps prevent death and disability. The most common treatment for DVT is blood thinning (anticoagulant) medicine, which reduces the blood's tendency to clot. Anticoagulants can stop new blood clots from forming and old ones from growing. They cannot dissolve existing clots. Your body does this by itself over time. Anticoagulants can be given by mouth, by intravenous (IV) access, or by injection. Your caregiver will determine the best program for you.  Heparin or related medicines (low molecular weight heparin) are usually the first treatment for a blood clot. They act quickly. However, they cannot be taken orally.  Heparin can cause a fall in a component of blood that  stops bleeding and forms blood clots (platelets). You will be monitored with blood tests to be sure this does not occur.  Warfarin is an anticoagulant that can be swallowed (taken orally). It takes a few days to start working, so usually heparin or related medicines are used in combination. Once warfarin is working, heparin is usually stopped.  Less commonly, clot dissolving drugs (thrombolytics) are used to dissolve a DVT. They carry a high risk of bleeding, so they are used mainly in severe cases, where a life or limb is threatened.  Very rarely, a blood clot in the leg needs to be removed surgically.  If you are unable to take anticoagulants, your caregiver may arrange for you to have a filter placed in a main vein in your belly (abdomen). This filter prevents clots from traveling to your lungs. HOME CARE INSTRUCTIONS  Take all medicines prescribed by your caregiver. Follow the directions carefully.  Warfarin. Most people will continue taking warfarin after hospital discharge. Your caregiver will advise you on the length of treatment (usually 3 6 months, sometimes lifelong).  Too  much and too little warfarin are both dangerous. Too much warfarin increases the risk of bleeding. Too little warfarin continues to allow the risk for blood clots. While taking warfarin, you will need to have regular blood tests to measure your blood clotting time. These blood tests usually include both the prothrombin time (PT) and international normalized ratio (INR) tests. The PT and INR results allow your caregiver to adjust your dose of warfarin. The dose can change for many reasons. It is critically important that you take warfarin exactly as prescribed, and that you have your PT and INR levels drawn exactly as directed.  Many foods, especially foods high in vitamin K can interfere with warfarin and affect the PT and INR results. Foods high in vitamin K include spinach, kale, broccoli, cabbage, collard and turnip  greens, brussels sprouts, peas, cauliflower, seaweed, and parsley as well as beef and pork liver, green tea, and soybean oil. You should eat a consistent amount of foods high in vitamin K. Avoid major changes in your diet, or notify your caregiver before changing your diet. Arrange a visit with a dietitian to answer your questions.  Many medicines can interfere with warfarin and affect the PT and INR results. You must tell your caregiver about any and all medicines you take, this includes all vitamins and supplements. Be especially cautious with aspirin and anti-inflammatory medicines. Ask your caregiver before taking these. Do not take or discontinue any prescribed or over-the-counter medicine except on the advice of your caregiver or pharmacist.  Warfarin can have side effects, primarily excessive bruising or bleeding. You will need to hold pressure over cuts for longer than usual. Your caregiver or pharmacist will discuss other potential side effects.  Alcohol can change the body's ability to handle warfarin. It is best to avoid alcoholic drinks or consume only very small amounts while taking warfarin. Notify your caregiver if you change your alcohol intake.  Notify your dentist or other caregivers before procedures.  Activity. Ask your caregiver how soon you can go back to normal activities. It is important to stay active to prevent blood clots. If you are on anticoagulant medicine, avoid contact sports.  Exercise. It is very important to exercise. This is especially important while traveling, sitting or standing for long periods of time. Exercise your legs by walking or by pumping the muscles frequently. Take frequent walks.  Compression stockings. These are tight elastic stockings that apply pressure to the lower legs. This pressure can help keep the blood in the legs from clotting. You may need to wear compressions stockings at home to help prevent a DVT.  Smoking. If you smoke, quit. Ask your  caregiver for help with quitting smoking.  Learn as much as you can about DVT. Knowing more about the condition should help you keep it from coming back.  Wear a medical alert bracelet or carry a medical alert card. SEEK MEDICAL CARE IF:  You notice a rapid heartbeat.  You feel weaker or more tired than usual.  You feel faint.  You notice increased bruising.  You feel your symptoms are not getting better in the time expected.  You believe you are having side effects of medicine. SEEK IMMEDIATE MEDICAL CARE IF:  You have chest pain.  You have trouble breathing.  You have new or increased swelling or pain in one leg.  You cough up blood.  You notice blood in vomit, in a bowel movement, or in urine. MAKE SURE YOU:  Understand these instructions.  Will watch your condition.  Will get help right away if you are not doing well or get worse. Document Released: 05/24/2005 Document Revised: 02/16/2012 Document Reviewed: 07/16/2010 [facility] Patient Information 2014 [place].

## 2013-03-06 ENCOUNTER — Ambulatory Visit: Payer: Medicare Other | Admitting: Nurse Practitioner

## 2013-03-09 ENCOUNTER — Ambulatory Visit (INDEPENDENT_AMBULATORY_CARE_PROVIDER_SITE_OTHER): Payer: Medicare Other | Admitting: *Deleted

## 2013-03-09 ENCOUNTER — Encounter: Payer: Self-pay | Admitting: Pharmacist

## 2013-03-09 DIAGNOSIS — I82402 Acute embolism and thrombosis of unspecified deep veins of left lower extremity: Secondary | ICD-10-CM

## 2013-03-09 DIAGNOSIS — Z7901 Long term (current) use of anticoagulants: Secondary | ICD-10-CM

## 2013-03-09 DIAGNOSIS — I82409 Acute embolism and thrombosis of unspecified deep veins of unspecified lower extremity: Secondary | ICD-10-CM

## 2013-03-09 NOTE — Progress Notes (Signed)
  Subjective:    Patient ID: Richard Moses, male    DOB: 05-May-1933, 79 y.o.   MRN: [id number]  HPI Noted and agree    Review of Systems     Objective:   Physical Exam        Assessment & Plan:

## 2013-03-09 NOTE — Patient Instructions (Addendum)
Please STOP Warfarin AND Lovenox.  Start Xarelto 15mg  Twice daily.

## 2013-03-09 NOTE — Progress Notes (Signed)
Incredible!!!

## 2013-03-09 NOTE — Assessment & Plan Note (Signed)
After patient educated about similar risk of bleeding with all anticoagulation therapies, he wishes to start Xarelto. Start Xarelto 15 mg BID with meals for 15 days to complete 21 day course for treatment of DVT, then transition to 20 mg daily. He has follow-up scheduled with Dr. Marily Memos on Monday 10/6, where he will bring his warfarin and lovenox for disposal.  Plan date of 20mg  Xarelto 10/18.   We can provide samples to start if necessary.

## 2013-03-09 NOTE — Progress Notes (Signed)
Patient ID: Richard Moses, male   DOB: 1933-02-16, 79 y.o.   MRN: [id number] HPI Patient presents today after 6 days of warfarin and lovenox for DVT in left leg.  His INR today is 1.2.  He states he does not like taking shots and has concerns about warfarin.  He is interested in switching to Xarelto or Eliquis, but somewhat hesitant due to advertisements he sees on TV about bleeding. A/P After patient educated about similar risk of bleeding with all anticoagulation therapies, he wishes to start Xarelto. Start Xarelto 15 mg BID with meals for 15 days to complete 21 day course for treatment of DVT, then transition to 20 mg daily. He has follow-up scheduled with Dr. Marily Memos on Monday 10/6, where he will bring his warfarin and lovenox for disposal.  Plan date of 20mg  Xarelto 10/18.   We can provide samples to start if necessary.   Samples given: Xarelto 15 mg  5 tablets  #2 Lot [id number]  Exp 2/16 Xarelto 15 mg 5 tablets  #4 Lot [id number]  Exp 9/16

## 2013-03-12 ENCOUNTER — Other Ambulatory Visit: Payer: Self-pay | Admitting: Pharmacist

## 2013-03-12 ENCOUNTER — Ambulatory Visit (INDEPENDENT_AMBULATORY_CARE_PROVIDER_SITE_OTHER): Payer: Medicare Other | Admitting: Family Medicine

## 2013-03-12 ENCOUNTER — Encounter: Payer: Self-pay | Admitting: Family Medicine

## 2013-03-12 VITALS — BP 142/76 | HR 92 | Temp 98.0°F | Wt 198.0 lb

## 2013-03-12 DIAGNOSIS — M7989 Other specified soft tissue disorders: Secondary | ICD-10-CM

## 2013-03-12 DIAGNOSIS — Z23 Encounter for immunization: Secondary | ICD-10-CM

## 2013-03-12 DIAGNOSIS — I82409 Acute embolism and thrombosis of unspecified deep veins of unspecified lower extremity: Secondary | ICD-10-CM

## 2013-03-12 DIAGNOSIS — M6789 Other specified disorders of synovium and tendon, multiple sites: Secondary | ICD-10-CM

## 2013-03-12 DIAGNOSIS — M7660 Achilles tendinitis, unspecified leg: Secondary | ICD-10-CM

## 2013-03-12 DIAGNOSIS — I82402 Acute embolism and thrombosis of unspecified deep veins of left lower extremity: Secondary | ICD-10-CM

## 2013-03-12 DIAGNOSIS — I10 Essential (primary) hypertension: Secondary | ICD-10-CM

## 2013-03-12 NOTE — Progress Notes (Signed)
Richard Moses is a 79 y.o. male who presents to [facility] today for LE edema/DVT.   Exercising more.   Heel pain. At random times. Not in am. Increased sensation to change in surfaces. Improving w/  Ankle exercises previously provided to pt.   Leg swelling improving. Denies CP, SOB. No bruising on Xarelto  Hlth Mnt: reviewed heatlh mt. Due for prevnar and administered. Refuses Flu shot   The following portions of the patient's history were reviewed and updated as appropriate: allergies, current medications, past medical history, family and social history, and problem list.  Patient is a nonsmoker.   Past Medical History  Diagnosis Date  . Diabetes mellitus   . Hypertension   . Hyperlipidemia   . Neck pain on left side 2012  . Chronic left-sided headaches   . MRI of brain abnormal 09/29/10    Abnormal MRI of brain, demonstrating mild atrophy  . 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 Abnormal MRI, cervical spine 09/29/10    Mildly abnormal MRI cervical spine demonstrating mild spondylosis and disc bulging from C4-5 down to C7-T1.  No spinal stenosis or foraminal narrowing  . Hypothyroidism 09/29/10    Untreated. Patient not interested in Rx.   . Colon polyp 2007  . External hemorrhoids without mention of complication [id number]    ROS as above otherwise neg.    Medications reviewed. Current Outpatient Prescriptions  Medication Sig Dispense Refill  . Misc Natural Products (PROSTATE SUPPORT PO) Take 1 capsule by mouth daily.      . Multiple Vitamin (MULTIVITAMIN) tablet Take 1 tablet by mouth daily.      . nitroGLYCERIN (NITRO-DUR) 0.1 mg/hr patch Cut patch into quarters and apply one quarter patch to achilles tendon area. Remove and repeat daily  30 patch  12  . Rivaroxaban (XARELTO) 15 MG TABS tablet Take 15 mg by mouth 2 (two) times daily with a meal.       No current facility-administered medications for this visit.    Exam:  BP 142/76  Pulse 92  Temp(Src) 98 F  (36.7 C) (Oral)  Wt 198 lb (89.812 kg)  BMI 27.63 kg/m2 Gen: Well NAD HEENT: EOMI,  MMM Lungs: CTABL Nl WOB Heart: RRR no MRG Abd: NABS, NT, ND Exts: LLE w/ 1+ pittign edema, RLE w/o edema.  MSK: L Heal w/ mild pain on deep palpation, FROm   No results found for this or any previous visit (from the past 72 hour(s)).

## 2013-03-12 NOTE — Assessment & Plan Note (Signed)
Improving.

## 2013-03-12 NOTE — Assessment & Plan Note (Signed)
No signs of PE.  Cont Xarelto

## 2013-03-12 NOTE — Assessment & Plan Note (Signed)
At goal today No change

## 2013-03-12 NOTE — Progress Notes (Addendum)
Asked by Dr. Marily Memos to discard and destroy Warfarin 5mg  #26 tablets and lovenox 100mg /ml #11 syringes. Filled out form for destruction.

## 2013-03-12 NOTE — Patient Instructions (Addendum)
Thank you for coming in today.  You are doing well overall  Please continue your exercises for your heels. Continue walking daily Please come back to see me in 6 months for your next check up and to discuss stopping the Xarelto.

## 2013-03-12 NOTE — Assessment & Plan Note (Signed)
Improving w/ nitro patches and exercises. No change

## 2013-03-22 ENCOUNTER — Encounter: Payer: Self-pay | Admitting: Family Medicine

## 2013-03-22 ENCOUNTER — Ambulatory Visit (INDEPENDENT_AMBULATORY_CARE_PROVIDER_SITE_OTHER): Payer: Medicare Other | Admitting: Family Medicine

## 2013-03-22 VITALS — BP 150/80 | HR 79 | Temp 98.4°F | Wt 199.0 lb

## 2013-03-22 DIAGNOSIS — Z0000 Encounter for general adult medical examination without abnormal findings: Secondary | ICD-10-CM

## 2013-03-22 DIAGNOSIS — I10 Essential (primary) hypertension: Secondary | ICD-10-CM

## 2013-03-22 DIAGNOSIS — I82409 Acute embolism and thrombosis of unspecified deep veins of unspecified lower extremity: Secondary | ICD-10-CM

## 2013-03-22 DIAGNOSIS — I82402 Acute embolism and thrombosis of unspecified deep veins of left lower extremity: Secondary | ICD-10-CM

## 2013-03-22 DIAGNOSIS — M7660 Achilles tendinitis, unspecified leg: Secondary | ICD-10-CM

## 2013-03-22 DIAGNOSIS — M7989 Other specified soft tissue disorders: Secondary | ICD-10-CM

## 2013-03-22 DIAGNOSIS — M6789 Other specified disorders of synovium and tendon, multiple sites: Secondary | ICD-10-CM

## 2013-03-22 MED ORDER — RIVAROXABAN 20 MG PO TABS: 20.0000 mg | ORAL_TABLET | Freq: Every day | ORAL | Status: DC
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
## 2013-03-22 NOTE — Patient Instructions (Signed)
Thank you for coming in today You are doing very well overall Please continue taking your Xarelto for your DVT Please come back to see me the first week of April and we will stop your Xarelto at that time Have a wonderful Holiday season and New year.

## 2013-03-22 NOTE — Assessment & Plan Note (Signed)
Cont nirto patch and exercises

## 2013-03-22 NOTE — Assessment & Plan Note (Signed)
Resolving. Cont current therapy

## 2013-03-22 NOTE — Assessment & Plan Note (Signed)
At upper level of nml. Cont current therapy Previously nml

## 2013-03-22 NOTE — Assessment & Plan Note (Signed)
Change to Xarelto 20 Qday today. Cont for 6 mo

## 2013-07-11 ENCOUNTER — Ambulatory Visit (INDEPENDENT_AMBULATORY_CARE_PROVIDER_SITE_OTHER): Payer: Medicare HMO | Admitting: Family Medicine

## 2013-07-11 ENCOUNTER — Encounter: Payer: Self-pay | Admitting: Family Medicine

## 2013-07-11 VITALS — BP 122/84 | HR 96 | Temp 98.7°F | Wt 195.0 lb

## 2013-07-11 DIAGNOSIS — R109 Unspecified abdominal pain: Secondary | ICD-10-CM

## 2013-07-11 DIAGNOSIS — L989 Disorder of the skin and subcutaneous tissue, unspecified: Secondary | ICD-10-CM

## 2013-07-11 DIAGNOSIS — I10 Essential (primary) hypertension: Secondary | ICD-10-CM

## 2013-07-11 LAB — POCT URINALYSIS DIPSTICK
Bilirubin, UA: NEGATIVE
Blood, UA: NEGATIVE
GLUCOSE UA: NEGATIVE
Ketones, UA: NEGATIVE
Leukocytes, UA: NEGATIVE
NITRITE UA: NEGATIVE
SPEC GRAV UA: 1.02
Urobilinogen, UA: 0.2
pH, UA: 7

## 2013-07-11 NOTE — Assessment & Plan Note (Signed)
Well controlled at home.  Pt to bring in cuff for future read to make sure acurately calibrated Pt w/ fair amount of white coat syndrome

## 2013-07-11 NOTE — Assessment & Plan Note (Signed)
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 Pt w/ h/o melanoma Scheduled for excision tomorrow morning Hold Xarelto until after procedure

## 2013-07-11 NOTE — Assessment & Plan Note (Addendum)
Concern for stone vs UTI vs msk pain UA today - nml May need CT if not resolving but suspect this will resolve w/o further intervention

## 2013-07-11 NOTE — Patient Instructions (Signed)
You are doing well overall Please come back tomorrow morning at 8:30 to have the spot on your leg removed Please do not take your xarelto tonight Please use preparation H for the hemorrhoids, please increase your fiber and fluid intake.  Please bring in your BP cuff to check with our machine  Knee Exercises EXERCISES RANGE OF MOTION(ROM) AND STRETCHING EXERCISES These exercises may help you when beginning to rehabilitate your injury. Your symptoms may resolve with or without further involvement from your physician, physical therapist or athletic trainer. While completing these exercises, remember:   Restoring tissue flexibility helps normal motion to return to the joints. This allows healthier, less painful movement and activity.  An effective stretch should be held for at least 30 seconds.  A stretch should never be painful. You should only feel a gentle lengthening or release in the stretched tissue. STRETCH - Knee Extension, Prone  Lie on your stomach on a firm surface, such as a bed or countertop. Place your right / left knee and leg just beyond the edge of the surface. You may wish to place a towel under the far end of your right / left thigh for comfort.  Relax your leg muscles and allow gravity to straighten your knee. Your clinician may advise you to add an ankle weight if more resistance is helpful for you.  You should feel a stretch in the back of your right / left knee. Hold this position for __________ seconds. Repeat __________ times. Complete this stretch __________ times per day. * Your physician, physical therapist or athletic trainer may ask you to add ankle weight to enhance your stretch.  RANGE OF MOTION - Knee Flexion, Active  Lie on your back with both knees straight. (If this causes back discomfort, bend your opposite knee, placing your foot flat on the floor.)  Slowly slide your heel back toward your buttocks until you feel a gentle stretch in the front of your  knee or thigh.  Hold for __________ seconds. Slowly slide your heel back to the starting position. Repeat __________ times. Complete this exercise __________ times per day.  STRETCH - Quadriceps, Prone   Lie on your stomach on a firm surface, such as a bed or padded floor.  Bend your right / left knee and grasp your ankle. If you are unable to reach, your ankle or pant leg, use a belt around your foot to lengthen your reach.  Gently pull your heel toward your buttocks. Your knee should not slide out to the side. You should feel a stretch in the front of your thigh and/or knee.  Hold this position for __________ seconds. Repeat __________ times. Complete this stretch __________ times per day.  STRETCH  Hamstrings, Supine   Lie on your back. Loop a belt or towel over the ball of your right / left foot.  Straighten your right / left knee and slowly pull on the belt to raise your leg. Do not allow the right / left knee to bend. Keep your opposite leg flat on the floor.  Raise the leg until you feel a gentle stretch behind your right / left knee or thigh. Hold this position for __________ seconds. Repeat __________ times. Complete this stretch __________ times per day.  STRENGTHENING EXERCISES These exercises may help you when beginning to rehabilitate your injury. They may resolve your symptoms with or without further involvement from your physician, physical therapist or athletic trainer. While completing these exercises, remember:   Muscles can gain  both the endurance and the strength needed for everyday activities through controlled exercises.  Complete these exercises as instructed by your physician, physical therapist or athletic trainer. Progress the resistance and repetitions only as guided.  You may experience muscle soreness or fatigue, but the pain or discomfort you are trying to eliminate should never worsen during these exercises. If this pain does worsen, stop and make certain  you are following the directions exactly. If the pain is still present after adjustments, discontinue the exercise until you can discuss the trouble with your clinician. STRENGTH - Quadriceps, Isometrics  Lie on your back with your right / left leg extended and your opposite knee bent.  Gradually tense the muscles in the front of your right / left thigh. You should see either your knee cap slide up toward your hip or increased dimpling just above the knee. This motion will push the back of the knee down toward the floor/mat/bed on which you are lying.  Hold the muscle as tight as you can without increasing your pain for __________ seconds.  Relax the muscles slowly and completely in between each repetition. Repeat __________ times. Complete this exercise __________ times per day.  STRENGTH - Quadriceps, Short Arcs   Lie on your back. Place a __________ inch towel roll under your knee so that the knee slightly bends.  Raise only your lower leg by tightening the muscles in the front of your thigh. Do not allow your thigh to rise.  Hold this position for __________ seconds. Repeat __________ times. Complete this exercise __________ times per day.  OPTIONAL ANKLE WEIGHTS: Begin with ____________________, but DO NOT exceed ____________________. Increase in 1 pound/0.5 kilogram increments.  STRENGTH - Quadriceps, Straight Leg Raises  Quality counts! Watch for signs that the quadriceps muscle is working to insure you are strengthening the correct muscles and not "cheating" by substituting with healthier muscles.  Lay on your back with your right / left leg extended and your opposite knee bent.  Tense the muscles in the front of your right / left thigh. You should see either your knee cap slide up or increased dimpling just above the knee. Your thigh may even quiver.  Tighten these muscles even more and raise your leg 4 to 6 inches off the floor. Hold for __________ seconds.  Keeping these  muscles tense, lower your leg.  Relax the muscles slowly and completely in between each repetition. Repeat __________ times. Complete this exercise __________ times per day.  STRENGTH - Hamstring, Curls  Lay on your stomach with your legs extended. (If you lay on a bed, your feet may hang over the edge.)  Tighten the muscles in the back of your thigh to bend your right / left knee up to 90 degrees. Keep your hips flat on the bed/floor.  Hold this position for __________ seconds.  Slowly lower your leg back to the starting position. Repeat __________ times. Complete this exercise __________ times per day.  OPTIONAL ANKLE WEIGHTS: Begin with ____________________, but DO NOT exceed ____________________. Increase in 1 pound/0.5 kilogram increments.  STRENGTH  Quadriceps, Squats  Stand in a door frame so that your feet and knees are in line with the frame.  Use your hands for balance, not support, on the frame.  Slowly lower your weight, bending at the hips and knees. Keep your lower legs upright so that they are parallel with the door frame. Squat only within the range that does not increase your knee pain. Never let your  hips drop below your knees.  Slowly return upright, pushing with your legs, not pulling with your hands. Repeat __________ times. Complete this exercise __________ times per day.  STRENGTH - Quadriceps, Wall Slides  Follow guidelines for form closely. Increased knee pain often results from poorly placed feet or knees.  Lean against a smooth wall or door and walk your feet out 18-24 inches. Place your feet hip-width apart.  Slowly slide down the wall or door until your knees bend __________ degrees.* Keep your knees over your heels, not your toes, and in line with your hips, not falling to either side.  Hold for __________ seconds. Stand up to rest for __________ seconds in between each repetition. Repeat __________ times. Complete this exercise __________ times per  day. * Your physician, physical therapist or athletic trainer will alter this angle based on your symptoms and progress. Document Released: 04/07/2005 Document Revised: 08/16/2011 Document Reviewed: 09/05/2008 [facility] Patient Information 2014 [place], [place].

## 2013-07-11 NOTE — Progress Notes (Signed)
Richard Moses is a 80 y.o. male who presents to [facility] today for f/u   L Leg bump: started 2 wks ago. Crusted cap that came off. Non-painful but mildly puritic.   Hemorrhoids: bleeding twice in the night while on xarelto. Stopped xarelto and bleeding resolved. Non-painful. Has daily BMs.   L flank pain w/ urination: onset 4 wks ago. Denies any change in urine, Denies any frequency or dysuria.   HTN: 2 cuffs at home and typically around 120s SBP.   The following portions of the patient's history were reviewed and updated as appropriate: allergies, current medications, past medical history, family and social history, and problem list.  Patient is a nonsmoker.  Past Medical History  Diagnosis Date  . Diabetes mellitus   . Hypertension   . Hyperlipidemia   . Neck pain on left side 2012  . Chronic left-sided headaches   . MRI of brain abnormal 09/29/10    Abnormal MRI of brain, demonstrating mild atrophy  . 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 Abnormal MRI, cervical spine 09/29/10    Mildly abnormal MRI cervical spine demonstrating mild spondylosis and disc bulging from C4-5 down to C7-T1.  No spinal stenosis or foraminal narrowing  . Hypothyroidism 09/29/10    Untreated. Patient not interested in Rx.   . Colon polyp 2007  . External hemorrhoids without mention of complication [id number]    ROS as above otherwise neg.    Medications reviewed. Current Outpatient Prescriptions  Medication Sig Dispense Refill  . Alpha-Lipoic Acid 300 MG TABS Take 600 mg by mouth 3 (three) times daily.      . B Complex-C (B-COMPLEX WITH VITAMIN C) tablet Take 1 tablet by mouth daily.      Jolyne Loa Grape-Goldenseal (BERBERINE COMPLEX PO) Take 500 mg by mouth 3 (three) times daily with meals.      . cholecalciferol (VITAMIN D) 400 UNITS TABS tablet Take 1,000 Units by mouth 3 (three) times daily with meals.      . Chromium Picolinate 200 MCG CAPS Take 66 mcg by mouth 3 (three) times daily  with meals.      Marland Kitchen DHEA 25 MG CAPS Take 1 capsule by mouth daily.      . Flaxseed, Linseed, (FLAXSEED OIL) 1000 MG CAPS Take 1 capsule by mouth daily.      . Grape Seed Extract 100 MG CAPS Take 150 mg by mouth daily.      . Iodine, Kelp, 0.15 MG TABS Take 2 tablets by mouth daily.      . Iodine, Kelp, 0.15 MG TABS Take 1 tablet by mouth daily.      . Lutein 20 MG TABS Take 1 tablet by mouth daily.      . Mag Oxide-Vit D3-Turmeric (MAGNESIUM-VITAMIN D3-TURMERIC PO) Take 2 tablets by mouth daily.      . Melatonin 3 MG TABS Take 1 tablet by mouth at bedtime as needed.      . Methylcobalamin (B-12) 5000 MCG TBDP Take 1 tablet by mouth daily.      . Misc Natural Products (OSTEO BI-FLEX ADV TRIPLE ST PO) Take 1 tablet by mouth 2 (two) times daily.      . Misc Natural Products (PROSTATE SUPPORT PO) Take 1 capsule by mouth daily.      . Multiple Vitamin (MULTIVITAMIN) tablet Take 1 tablet by mouth daily.      . Multiple Vitamins-Minerals (MENS MULTIVITAMIN PLUS) TABS Take 1 tablet by mouth  daily after breakfast.      . nitroGLYCERIN (NITRO-DUR) 0.1 mg/hr patch Cut patch into quarters and apply one quarter patch to achilles tendon area. Remove and repeat daily  30 patch  12  . Rivaroxaban (XARELTO) 20 MG TABS tablet Take 1 tablet (20 mg total) by mouth daily.  90 tablet  1  . saw palmetto 80 MG capsule Take 80 mg by mouth daily.      . vitamin A 10000 UNIT capsule Take 40,000 Units by mouth daily.      . Vitamin D, Ergocalciferol, (DRISDOL) 50000 UNITS CAPS capsule Take 50,000 Units by mouth daily.      Marland Kitchen VITAMIN D-VITAMIN K PO Take 2 tablets by mouth daily.      . Zeaxanthin POWD Take 4 mg by mouth daily.       No current facility-administered medications for this visit.     Exam:  BP 122/84  Pulse 96  Temp(Src) 98.7 F (37.1 C) (Oral)  Wt 195 lb (88.451 kg) Gen: Well NAD HEENT: EOMI,  MMM SKin: Large >1cm raised firm lesion on lateral L calf w/ central ulceration and scabbing  Results for  orders placed in visit on 07/11/13 (from the past 72 hour(s))  POCT URINALYSIS DIPSTICK     Status: Abnormal   Collection Time    07/11/13  3:05 PM      Result Value Range   Color, UA YELLOW     Clarity, UA CLEAR     Glucose, UA NEGATIVE     Bilirubin, UA NEGATIVE     Ketones, UA NEGATIVE     Spec Grav, UA 1.020     Blood, UA NEGATIVE     pH, UA 7.0     Protein, UA TRACE     Urobilinogen, UA 0.2     Nitrite, UA NEGATIVE     Leukocytes, UA Negative      A/P (as seen in Problem list)  Flank pain Concern for stone vs UTI vs msk pain UA today - nml May need CT if not resolving but suspect this will resolve w/o further intervention  ESSENTIAL HYPERTENSION Well controlled at home.  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 Pt w/ h/o melanoma Scheduled for excision tomorrow morning Hold Xarelto until after procedure

## 2013-07-12 ENCOUNTER — Encounter: Payer: Self-pay | Admitting: Family Medicine

## 2013-07-12 ENCOUNTER — Other Ambulatory Visit: Payer: Self-pay | Admitting: Family Medicine

## 2013-07-12 ENCOUNTER — Ambulatory Visit (INDEPENDENT_AMBULATORY_CARE_PROVIDER_SITE_OTHER): Payer: Medicare HMO | Admitting: Family Medicine

## 2013-07-12 VITALS — BP 134/64 | HR 80 | Temp 97.9°F | Wt 195.5 lb

## 2013-07-12 DIAGNOSIS — C44721 Squamous cell carcinoma of skin of unspecified lower limb, including hip: Secondary | ICD-10-CM

## 2013-07-12 DIAGNOSIS — L989 Disorder of the skin and subcutaneous tissue, unspecified: Secondary | ICD-10-CM

## 2013-07-12 NOTE — Patient Instructions (Signed)
Thank you for coming in today I'm fairly confident we got all of the growth out of your leg Please keep your leg bent for most of the next 1-2 days and do not participate in any high intensity activity until your sutures come out Please ice your leg today Please take the dressings off tomorrow and shower as normal. Please clean the area daily gently with soapy water Please call if you notice any swelling or severe pain or purulent discharge from the incision site Please come back to see me anywhere from 10-14 days to have the sutures removed We will let you know the results from the pathologist after they come back Please start your Xarelto tonight and go to the ER if you develop any sudden onset shortness of breath or severe chest pain.

## 2013-07-13 NOTE — Progress Notes (Signed)
Richard Moses is a 80 y.o. male who presents to [facility] today for skin cancer removal  Skin Cancer: started over the course of the last 2 weeks. No previous injury or lesion in that area. No h/o bite or trauma to that atea. H/o melanoma removal on shoulder. H/o significant sun damage to skin. Lesion became open sore then scabbed over again a few days ago. Firm. Denies fevers, or purulent discharge  HTN: at goal. Confirmed pressures w/ home cuff    The following portions of the patient's history were reviewed and updated as appropriate: allergies, current medications, past medical history, family and social history, and problem list.  Patient is a nonsmoker,.  Health Maintenance: none  Past Medical History  Diagnosis Date  . Diabetes mellitus   . Hypertension   . Hyperlipidemia   . Neck pain on left side 2012  . Chronic left-sided headaches   . MRI of brain abnormal 09/29/10    Abnormal MRI of brain, demonstrating mild atrophy  . 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 Abnormal MRI, cervical spine 09/29/10    Mildly abnormal MRI cervical spine demonstrating mild spondylosis and disc bulging from C4-5 down to C7-T1.  No spinal stenosis or foraminal narrowing  . Hypothyroidism 09/29/10    Untreated. Patient not interested in Rx.   . Colon polyp 2007  . External hemorrhoids without mention of complication [id number]    ROS as above otherwise neg.    Medications reviewed. Current Outpatient Prescriptions  Medication Sig Dispense Refill  . Alpha-Lipoic Acid 300 MG TABS Take 600 mg by mouth 3 (three) times daily.      . B Complex-C (B-COMPLEX WITH VITAMIN C) tablet Take 1 tablet by mouth daily.      Jolyne Loa Grape-Goldenseal (BERBERINE COMPLEX PO) Take 500 mg by mouth 3 (three) times daily with meals.      . cholecalciferol (VITAMIN D) 400 UNITS TABS tablet Take 1,000 Units by mouth 3 (three) times daily with meals.      . Chromium Picolinate 200 MCG CAPS Take 66 mcg by  mouth 3 (three) times daily with meals.      Marland Kitchen DHEA 25 MG CAPS Take 1 capsule by mouth daily.      . Flaxseed, Linseed, (FLAXSEED OIL) 1000 MG CAPS Take 1 capsule by mouth daily.      . Grape Seed Extract 100 MG CAPS Take 150 mg by mouth daily.      . Iodine, Kelp, 0.15 MG TABS Take 2 tablets by mouth daily.      . Iodine, Kelp, 0.15 MG TABS Take 1 tablet by mouth daily.      . Lutein 20 MG TABS Take 1 tablet by mouth daily.      . Mag Oxide-Vit D3-Turmeric (MAGNESIUM-VITAMIN D3-TURMERIC PO) Take 2 tablets by mouth daily.      . Melatonin 3 MG TABS Take 1 tablet by mouth at bedtime as needed.      . Methylcobalamin (B-12) 5000 MCG TBDP Take 1 tablet by mouth daily.      . Misc Natural Products (OSTEO BI-FLEX ADV TRIPLE ST PO) Take 1 tablet by mouth 2 (two) times daily.      . Misc Natural Products (PROSTATE SUPPORT PO) Take 1 capsule by mouth daily.      . Multiple Vitamin (MULTIVITAMIN) tablet Take 1 tablet by mouth daily.      . Multiple Vitamins-Minerals (MENS MULTIVITAMIN PLUS) TABS Take 1 tablet  by mouth daily after breakfast.      . nitroGLYCERIN (NITRO-DUR) 0.1 mg/hr patch Cut patch into quarters and apply one quarter patch to achilles tendon area. Remove and repeat daily  30 patch  12  . Rivaroxaban (XARELTO) 20 MG TABS tablet Take 1 tablet (20 mg total) by mouth daily.  90 tablet  1  . saw palmetto 80 MG capsule Take 80 mg by mouth daily.      . vitamin A 10000 UNIT capsule Take 40,000 Units by mouth daily.      . Vitamin D, Ergocalciferol, (DRISDOL) 50000 UNITS CAPS capsule Take 50,000 Units by mouth daily.      Marland Kitchen VITAMIN D-VITAMIN K PO Take 2 tablets by mouth daily.      . Zeaxanthin POWD Take 4 mg by mouth daily.       No current facility-administered medications for this visit.    Exam:  BP 134/64  Pulse 80  Temp(Src) 97.9 F (36.6 C) (Oral)  Wt 195 lb 8 oz (88.678 kg) Gen: Well NAD HEENT: EOMI,  MMM   Results for orders placed in visit on 07/11/13 (from the past 72  hour(s))  POCT URINALYSIS DIPSTICK     Status: Abnormal   Collection Time    07/11/13  3:05 PM      Result Value Range   Color, UA YELLOW     Clarity, UA CLEAR     Glucose, UA NEGATIVE     Bilirubin, UA NEGATIVE     Ketones, UA NEGATIVE     Spec Grav, UA 1.020     Blood, UA NEGATIVE     pH, UA 7.0     Protein, UA TRACE     Urobilinogen, UA 0.2     Nitrite, UA NEGATIVE     Leukocytes, UA Negative      A/P (as seen in Problem list)  No problem-specific assessment & plan notes found for this encounter.  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 male who presents for minor skin surgery.  The patient understands all risks, benefits, indications, potential complications, and alternatives, and freely consents for the procedure.  The patient also understands the option of performing no surgery, the risk for scarring, and the technique of the procedure.  ANESTHESIA:  Local.  TECHNIQUE:  After informed consent was obtained, and after the skin was prepped and draped, 2% lidocaine without epinephrine for anesthetic was injected around and underneath the site.   elliptical excision in total was performed. Wound closed w/ 7 subcutaneous 4-0 vycril sutures follow by 11 simple interrupted 4-0 nylon sutures on the surface.  A dressing was applied with bacitracin and wound care instructions were provided.  Richard Moses tolerated the procedure well and without complications.  The patient will be alert for any signs of cutaneous infection and will follow up as instructed.  Lesion size 1.6cm Defect/closure size 6.2cm  Linna Darner, MD Family Medicine PGY-3 07/13/2013, 8:48 AM

## 2013-07-18 ENCOUNTER — Telehealth: Payer: Self-pay | Admitting: Family Medicine

## 2013-07-18 NOTE — Telephone Encounter (Signed)
Called to inform pt of results of path Pt aware Wound not infected adn healing well per pt.  Suture removal in 2 wks from procedure.  Linna Darner, MD Family Medicine PGY-3 07/18/2013, 12:28 PM

## 2013-07-27 ENCOUNTER — Ambulatory Visit (INDEPENDENT_AMBULATORY_CARE_PROVIDER_SITE_OTHER): Payer: Medicare HMO | Admitting: Family Medicine

## 2013-07-27 ENCOUNTER — Encounter: Payer: Self-pay | Admitting: Family Medicine

## 2013-07-27 VITALS — BP 157/77 | HR 66 | Temp 97.7°F | Wt 193.0 lb

## 2013-07-27 DIAGNOSIS — M67919 Unspecified disorder of synovium and tendon, unspecified shoulder: Secondary | ICD-10-CM

## 2013-07-27 DIAGNOSIS — M719 Bursopathy, unspecified: Secondary | ICD-10-CM

## 2013-07-27 DIAGNOSIS — I82409 Acute embolism and thrombosis of unspecified deep veins of unspecified lower extremity: Secondary | ICD-10-CM

## 2013-07-27 DIAGNOSIS — L0390 Cellulitis, unspecified: Secondary | ICD-10-CM | POA: Insufficient documentation

## 2013-07-27 DIAGNOSIS — E1149 Type 2 diabetes mellitus with other diabetic neurological complication: Secondary | ICD-10-CM

## 2013-07-27 DIAGNOSIS — L989 Disorder of the skin and subcutaneous tissue, unspecified: Secondary | ICD-10-CM

## 2013-07-27 DIAGNOSIS — L0291 Cutaneous abscess, unspecified: Secondary | ICD-10-CM

## 2013-07-27 DIAGNOSIS — M7580 Other shoulder lesions, unspecified shoulder: Secondary | ICD-10-CM

## 2013-07-27 LAB — POCT GLYCOSYLATED HEMOGLOBIN (HGB A1C): Hemoglobin A1C: 7.1

## 2013-07-27 MED ORDER — SULFAMETHOXAZOLE-TMP DS 800-160 MG PO TABS
1.0000 | ORAL_TABLET | Freq: Two times a day (BID) | ORAL | Status: DC
Start: 2013-07-27 — End: 2013-11-05

## 2013-07-27 MED ORDER — DICLOFENAC SODIUM 1 % TD GEL: 2.0000 g | Freq: Four times a day (QID) | TRANSDERMAL | Status: DC

## 2013-07-27 NOTE — Assessment & Plan Note (Signed)
Back on Xarelto 
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
## 2013-07-27 NOTE — Patient Instructions (Signed)
You are doing well overall The suture site is infected and you need to start antibiotics Please come back on March 3rd You have rotator cuff tendonitis. Please start the topical pain reliever and exercises If this is not better we may consider doing a steroid injection at your next appointment  Impingement Syndrome, Rotator Cuff, Bursitis with Rehab Impingement syndrome is a condition that involves inflammation of the tendons of the rotator cuff and the subacromial bursa, that causes pain in the shoulder. The rotator cuff consists of four tendons and muscles that control much of the shoulder and upper arm function. The subacromial bursa is a fluid filled sac that helps reduce friction between the rotator cuff and one of the bones of the shoulder (acromion). Impingement syndrome is usually an overuse injury that causes swelling of the bursa (bursitis), swelling of the tendon (tendonitis), and/or a tear of the tendon (strain). Strains are classified into three categories. Grade 1 strains cause pain, but the tendon is not lengthened. Grade 2 strains include a lengthened ligament, due to the ligament being stretched or partially ruptured. With grade 2 strains there is still function, although the function may be decreased. Grade 3 strains include a complete tear of the tendon or muscle, and function is usually impaired. SYMPTOMS   Pain around the shoulder, often at the outer portion of the upper arm.  Pain that gets worse with shoulder function, especially when reaching overhead or lifting.  Sometimes, aching when not using the arm.  Pain that wakes you up at night.  Sometimes, tenderness, swelling, warmth, or redness over the affected area.  Loss of strength.  Limited motion of the shoulder, especially reaching behind the back (to the back pocket or to unhook bra) or across your body.  Crackling sound (crepitation) when moving the arm.  Biceps tendon pain and inflammation (in the front of the  shoulder). Worse when bending the elbow or lifting. CAUSES  Impingement syndrome is often an overuse injury, in which chronic (repetitive) motions cause the tendons or bursa to become inflamed. A strain occurs when a force is paced on the tendon or muscle that is greater than it can withstand. Common mechanisms of injury include: Stress from sudden increase in duration, frequency, or intensity of training.  Direct hit (trauma) to the shoulder.  Aging, erosion of the tendon with normal use.  Bony bump on shoulder (acromial spur). RISK INCREASES WITH:  Contact sports (football, wrestling, boxing).  Throwing sports (baseball, tennis, volleyball).  Weightlifting and bodybuilding.  Heavy labor.  Previous injury to the rotator cuff, including impingement.  Poor shoulder strength and flexibility.  Failure to warm up properly before activity.  Inadequate protective equipment.  Old age.  Bony bump on shoulder (acromial spur). PREVENTION   Warm up and stretch properly before activity.  Allow for adequate recovery between workouts.  Maintain physical fitness:  Strength, flexibility, and endurance.  Cardiovascular fitness.  Learn and use proper exercise technique. PROGNOSIS  If treated properly, impingement syndrome usually goes away within 6 weeks. Sometimes surgery is required.  RELATED COMPLICATIONS   Longer healing time if not properly treated, or if not given enough time to heal.  Recurring symptoms, that result in a chronic condition.  Shoulder stiffness, frozen shoulder, or loss of motion.  Rotator cuff tendon tear.  Recurring symptoms, especially if activity is resumed too soon, with overuse, with a direct blow, or when using poor technique. TREATMENT  Treatment first involves the use of ice and medicine, to reduce  pain and inflammation. The use of strengthening and stretching exercises may help reduce pain with activity. These exercises may be performed at home  or with a therapist. If non-surgical treatment is unsuccessful after more than 6 months, surgery may be advised. After surgery and rehabilitation, activity is usually possible in 3 months.  MEDICATION  If pain medicine is needed, nonsteroidal anti-inflammatory medicines (aspirin and ibuprofen), or other minor pain relievers (acetaminophen), are often advised.  Do not take pain medicine for 7 days before surgery.  Prescription pain relievers may be given, if your caregiver thinks they are needed. Use only as directed and only as much as you need.  Corticosteroid injections may be given by your caregiver. These injections should be reserved for the most serious cases, because they may only be given a certain number of times. HEAT AND COLD  Cold treatment (icing) should be applied for 10 to 15 minutes every 2 to 3 hours for inflammation and pain, and immediately after activity that aggravates your symptoms. Use ice packs or an ice massage.  Heat treatment may be used before performing stretching and strengthening activities prescribed by your caregiver, physical therapist, or athletic trainer. Use a heat pack or a warm water soak. SEEK MEDICAL CARE IF:   Symptoms get worse or do not improve in 4 to 6 weeks, despite treatment.  New, unexplained symptoms develop. (Drugs used in treatment may produce side effects.) EXERCISES  RANGE OF MOTION (ROM) AND STRETCHING EXERCISES - Impingement Syndrome (Rotator Cuff  Tendinitis, Bursitis) These exercises may help you when beginning to rehabilitate your injury. Your symptoms may go away with or without further involvement from your physician, physical therapist or athletic trainer. While completing these exercises, remember:   Restoring tissue flexibility helps normal motion to return to the joints. This allows healthier, less painful movement and activity.  An effective stretch should be held for at least 30 seconds.  A stretch should never be  painful. You should only feel a gentle lengthening or release in the stretched tissue. STRETCH  Flexion, Standing  Stand with good posture. With an underhand grip on your right / left hand, and an overhand grip on the opposite hand, grasp a broomstick or cane so that your hands are a little more than shoulder width apart.  Keeping your right / left elbow straight and shoulder muscles relaxed, push the stick with your opposite hand, to raise your right / left arm in front of your body and then overhead. Raise your arm until you feel a stretch in your right / left shoulder, but before you have increased shoulder pain.  Try to avoid shrugging your right / left shoulder as your arm rises, by keeping your shoulder blade tucked down and toward your mid-back spine. Hold for __________ seconds.  Slowly return to the starting position. Repeat __________ times. Complete this exercise __________ times per day. STRETCH  Abduction, Supine  Lie on your back. With an underhand grip on your right / left hand and an overhand grip on the opposite hand, grasp a broomstick or cane so that your hands are a little more than shoulder width apart.  Keeping your right / left elbow straight and your shoulder muscles relaxed, push the stick with your opposite hand, to raise your right / left arm out to the side of your body and then overhead. Raise your arm until you feel a stretch in your right / left shoulder, but before you have increased shoulder pain.  Try to  avoid shrugging your right / left shoulder as your arm rises, by keeping your shoulder blade tucked down and toward your mid-back spine. Hold for __________ seconds.  Slowly return to the starting position. Repeat __________ times. Complete this exercise __________ times per day. ROM  Flexion, Active-Assisted  Lie on your back. You may bend your knees for comfort.  Grasp a broomstick or cane so your hands are about shoulder width apart. Your right / left  hand should grip the end of the stick, so that your hand is positioned "thumbs-up," as if you were about to shake hands.  Using your healthy arm to lead, raise your right / left arm overhead, until you feel a gentle stretch in your shoulder. Hold for __________ seconds.  Use the stick to assist in returning your right / left arm to its starting position. Repeat __________ times. Complete this exercise __________ times per day.  ROM - Internal Rotation, Supine   Lie on your back on a firm surface. Place your right / left elbow about 60 degrees away from your side. Elevate your elbow with a folded towel, so that the elbow and shoulder are the same height.  Using a broomstick or cane and your strong arm, pull your right / left hand toward your body until you feel a gentle stretch, but no increase in your shoulder pain. Keep your shoulder and elbow in place throughout the exercise.  Hold for __________ seconds. Slowly return to the starting position. Repeat __________ times. Complete this exercise __________ times per day. STRETCH - Internal Rotation  Place your right / left hand behind your back, palm up.  Throw a towel or belt over your opposite shoulder. Grasp the towel with your right / left hand.  While keeping an upright posture, gently pull up on the towel, until you feel a stretch in the front of your right / left shoulder.  Avoid shrugging your right / left shoulder as your arm rises, by keeping your shoulder blade tucked down and toward your mid-back spine.  Hold for __________ seconds. Release the stretch, by lowering your healthy hand. Repeat __________ times. Complete this exercise __________ times per day. ROM - Internal Rotation   Using an underhand grip, grasp a stick behind your back with both hands.  While standing upright with good posture, slide the stick up your back until you feel a mild stretch in the front of your shoulder.  Hold for __________ seconds. Slowly  return to your starting position. Repeat __________ times. Complete this exercise __________ times per day.  STRETCH  Posterior Shoulder Capsule   Stand or sit with good posture. Grasp your right / left elbow and draw it across your chest, keeping it at the same height as your shoulder.  Pull your elbow, so your upper arm comes in closer to your chest. Pull until you feel a gentle stretch in the back of your shoulder.  Hold for __________ seconds. Repeat __________ times. Complete this exercise __________ times per day. STRENGTHENING EXERCISES - Impingement Syndrome (Rotator Cuff Tendinitis, Bursitis) These exercises may help you when beginning to rehabilitate your injury. They may resolve your symptoms with or without further involvement from your physician, physical therapist or athletic trainer. While completing these exercises, remember:  Muscles can gain both the endurance and the strength needed for everyday activities through controlled exercises.  Complete these exercises as instructed by your physician, physical therapist or athletic trainer. Increase the resistance and repetitions only as guided.  You  may experience muscle soreness or fatigue, but the pain or discomfort you are trying to eliminate should never worsen during these exercises. If this pain does get worse, stop and make sure you are following the directions exactly. If the pain is still present after adjustments, discontinue the exercise until you can discuss the trouble with your clinician.  During your recovery, avoid activity or exercises which involve actions that place your injured hand or elbow above your head or behind your back or head. These positions stress the tissues which you are trying to heal. STRENGTH - Scapular Depression and Adduction   With good posture, sit on a firm chair. Support your arms in front of you, with pillows, arm rests, or on a table top. Have your elbows in line with the sides of your  body.  Gently draw your shoulder blades down and toward your mid-back spine. Gradually increase the tension, without tensing the muscles along the top of your shoulders and the back of your neck.  Hold for __________ seconds. Slowly release the tension and relax your muscles completely before starting the next repetition.  After you have practiced this exercise, remove the arm support and complete the exercise in standing as well as sitting position. Repeat __________ times. Complete this exercise __________ times per day.  STRENGTH - Shoulder Abductors, Isometric  With good posture, stand or sit about 4-6 inches from a wall, with your right / left side facing the wall.  Bend your right / left elbow. Gently press your right / left elbow into the wall. Increase the pressure gradually, until you are pressing as hard as you can, without shrugging your shoulder or increasing any shoulder discomfort.  Hold for __________ seconds.  Release the tension slowly. Relax your shoulder muscles completely before you begin the next repetition. Repeat __________ times. Complete this exercise __________ times per day.  STRENGTH - External Rotators, Isometric  Keep your right / left elbow at your side and bend it 90 degrees.  Step into a door frame so that the outside of your right / left wrist can press against the door frame without your upper arm leaving your side.  Gently press your right / left wrist into the door frame, as if you were trying to swing the back of your hand away from your stomach. Gradually increase the tension, until you are pressing as hard as you can, without shrugging your shoulder or increasing any shoulder discomfort.  Hold for __________ seconds.  Release the tension slowly. Relax your shoulder muscles completely before you begin the next repetition. Repeat __________ times. Complete this exercise __________ times per day.  STRENGTH - Supraspinatus   Stand or sit with good  posture. Grasp a __________ weight, or an exercise band or tubing, so that your hand is "thumbs-up," like you are shaking hands.  Slowly lift your right / left arm in a "V" away from your thigh, diagonally into the space between your side and straight ahead. Lift your hand to shoulder height or as far as you can, without increasing any shoulder pain. At first, many people do not lift their hands above shoulder height.  Avoid shrugging your right / left shoulder as your arm rises, by keeping your shoulder blade tucked down and toward your mid-back spine.  Hold for __________ seconds. Control the descent of your hand, as you slowly return to your starting position. Repeat __________ times. Complete this exercise __________ times per day.  STRENGTH - External Rotators  Secure  a rubber exercise band or tubing to a fixed object (table, pole) so that it is at the same height as your right / left elbow when you are standing or sitting on a firm surface.  Stand or sit so that the secured exercise band is at your uninjured side.  Bend your right / left elbow 90 degrees. Place a folded towel or small pillow under your right / left arm, so that your elbow is a few inches away from your side.  Keeping the tension on the exercise band, pull it away from your body, as if pivoting on your elbow. Be sure to keep your body steady, so that the movement is coming only from your rotating shoulder.  Hold for __________ seconds. Release the tension in a controlled manner, as you return to the starting position. Repeat __________ times. Complete this exercise __________ times per day.  STRENGTH - Internal Rotators   Secure a rubber exercise band or tubing to a fixed object (table, pole) so that it is at the same height as your right / left elbow when you are standing or sitting on a firm surface.  Stand or sit so that the secured exercise band is at your right / left side.  Bend your elbow 90 degrees. Place a  folded towel or small pillow under your right / left arm so that your elbow is a few inches away from your side.  Keeping the tension on the exercise band, pull it across your body, toward your stomach. Be sure to keep your body steady, so that the movement is coming only from your rotating shoulder.  Hold for __________ seconds. Release the tension in a controlled manner, as you return to the starting position. Repeat __________ times. Complete this exercise __________ times per day.  STRENGTH - Scapular Protractors, Standing   Stand arms length away from a wall. Place your hands on the wall, keeping your elbows straight.  Begin by dropping your shoulder blades down and toward your mid-back spine.  To strengthen your protractors, keep your shoulder blades down, but slide them forward on your rib cage. It will feel as if you are lifting the back of your rib cage away from the wall. This is a subtle motion and can be challenging to complete. Ask your caregiver for further instruction, if you are not sure you are doing the exercise correctly.  Hold for __________ seconds. Slowly return to the starting position, resting the muscles completely before starting the next repetition. Repeat __________ times. Complete this exercise __________ times per day. STRENGTH - Scapular Protractors, Supine  Lie on your back on a firm surface. Extend your right / left arm straight into the air while holding a __________ weight in your hand.  Keeping your head and back in place, lift your shoulder off the floor.  Hold for __________ seconds. Slowly return to the starting position, and allow your muscles to relax completely before starting the next repetition. Repeat __________ times. Complete this exercise __________ times per day. STRENGTH - Scapular Protractors, Quadruped  Get onto your hands and knees, with your shoulders directly over your hands (or as close as you can be, comfortably).  Keeping your  elbows locked, lift the back of your rib cage up into your shoulder blades, so your mid-back rounds out. Keep your neck muscles relaxed.  Hold this position for __________ seconds. Slowly return to the starting position and allow your muscles to relax completely before starting the next repetition. Repeat  __________ times. Complete this exercise __________ times per day.  STRENGTH - Scapular Retractors  Secure a rubber exercise band or tubing to a fixed object (table, pole), so that it is at the height of your shoulders when you are either standing, or sitting on a firm armless chair.  With a palm down grip, grasp an end of the band in each hand. Straighten your elbows and lift your hands straight in front of you, at shoulder height. Step back, away from the secured end of the band, until it becomes tense.  Squeezing your shoulder blades together, draw your elbows back toward your sides, as you bend them. Keep your upper arms lifted away from your body throughout the exercise.  Hold for __________ seconds. Slowly ease the tension on the band, as you reverse the directions and return to the starting position. Repeat __________ times. Complete this exercise __________ times per day. STRENGTH - Shoulder Extensors   Secure a rubber exercise band or tubing to a fixed object (table, pole) so that it is at the height of your shoulders when you are either standing, or sitting on a firm armless chair.  With a thumbs-up grip, grasp an end of the band in each hand. Straighten your elbows and lift your hands straight in front of you, at shoulder height. Step back, away from the secured end of the band, until it becomes tense.  Squeezing your shoulder blades together, pull your hands down to the sides of your thighs. Do not allow your hands to go behind you.  Hold for __________ seconds. Slowly ease the tension on the band, as you reverse the directions and return to the starting position. Repeat  __________ times. Complete this exercise __________ times per day.  STRENGTH - Scapular Retractors and External Rotators   Secure a rubber exercise band or tubing to a fixed object (table, pole) so that it is at the height as your shoulders, when you are either standing, or sitting on a firm armless chair.  With a palm down grip, grasp an end of the band in each hand. Bend your elbows 90 degrees and lift your elbows to shoulder height, at your sides. Step back, away from the secured end of the band, until it becomes tense.  Squeezing your shoulder blades together, rotate your shoulders so that your upper arms and elbows remain stationary, but your fists travel upward to head height.  Hold for __________ seconds. Slowly ease the tension on the band, as you reverse the directions and return to the starting position. Repeat __________ times. Complete this exercise __________ times per day.  STRENGTH - Scapular Retractors and External Rotators, Rowing   Secure a rubber exercise band or tubing to a fixed object (table, pole) so that it is at the height of your shoulders, when you are either standing, or sitting on a firm armless chair.  With a palm down grip, grasp an end of the band in each hand. Straighten your elbows and lift your hands straight in front of you, at shoulder height. Step back, away from the secured end of the band, until it becomes tense.  Step 1: Squeeze your shoulder blades together. Bending your elbows, draw your hands to your chest, as if you are rowing a boat. At the end of this motion, your hands and elbow should be at shoulder height and your elbows should be out to your sides.  Step 2: Rotate your shoulders, to raise your hands above your head. Your forearms  should be vertical and your upper arms should be horizontal.  Hold for __________ seconds. Slowly ease the tension on the band, as you reverse the directions and return to the starting position. Repeat __________  times. Complete this exercise __________ times per day.  STRENGTH  Scapular Depressors  Find a sturdy chair without wheels, such as a dining room chair.  Keeping your feet on the floor, and your hands on the chair arms, lift your bottom up from the seat, and lock your elbows.  Keeping your elbows straight, allow gravity to pull your body weight down. Your shoulders will rise toward your ears.  Raise your body against gravity by drawing your shoulder blades down your back, shortening the distance between your shoulders and ears. Although your feet should always maintain contact with the floor, your feet should progressively support less body weight, as you get stronger.  Hold for __________ seconds. In a controlled and slow manner, lower your body weight to begin the next repetition. Repeat __________ times. Complete this exercise __________ times per day.  Document Released: 05/24/2005 Document Revised: 08/16/2011 Document Reviewed: 09/05/2008 [facility] Patient Information 2014 [place], [place].

## 2013-07-27 NOTE — Assessment & Plan Note (Signed)
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 Clear margins Sutures removed  Now w/ likely mild cellulitis Hoping ABX will be enough otherwise wound may dehisce and requre healing by secondary intention.

## 2013-07-27 NOTE — Assessment & Plan Note (Signed)
Voltaren gel Exercises ICE and rest.  Consider steroid injection if no improvement NSAIDs not an option due to Xarelto

## 2013-07-27 NOTE — Assessment & Plan Note (Signed)
Starting bactrim Culture sent Pt did not clean skin as instructed Hoping ABX will be enough otherwise wound may dehisce and requre healing by secondary intention.

## 2013-07-30 LAB — WOUND CULTURE
Gram Stain: NONE SEEN
Gram Stain: NONE SEEN
Gram Stain: NONE SEEN
Organism ID, Bacteria: NO GROWTH

## 2013-08-07 ENCOUNTER — Encounter: Payer: Self-pay | Admitting: Family Medicine

## 2013-08-07 ENCOUNTER — Ambulatory Visit (INDEPENDENT_AMBULATORY_CARE_PROVIDER_SITE_OTHER): Payer: Medicare HMO | Admitting: Family Medicine

## 2013-08-07 VITALS — BP 135/68 | HR 68 | Temp 99.1°F | Ht 71.0 in | Wt 188.0 lb

## 2013-08-07 DIAGNOSIS — L0291 Cutaneous abscess, unspecified: Secondary | ICD-10-CM

## 2013-08-07 DIAGNOSIS — L0390 Cellulitis, unspecified: Secondary | ICD-10-CM

## 2013-08-07 DIAGNOSIS — I10 Essential (primary) hypertension: Secondary | ICD-10-CM

## 2013-08-07 DIAGNOSIS — M67919 Unspecified disorder of synovium and tendon, unspecified shoulder: Secondary | ICD-10-CM

## 2013-08-07 DIAGNOSIS — M7580 Other shoulder lesions, unspecified shoulder: Secondary | ICD-10-CM

## 2013-08-07 DIAGNOSIS — M719 Bursopathy, unspecified: Secondary | ICD-10-CM

## 2013-08-07 DIAGNOSIS — I82409 Acute embolism and thrombosis of unspecified deep veins of unspecified lower extremity: Secondary | ICD-10-CM

## 2013-08-07 NOTE — Assessment & Plan Note (Signed)
Much improved w/ voltaren and exercises Likely initial exacerbation from R shoulder injury causing L shoulder overuse.
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
## 2013-08-07 NOTE — Patient Instructions (Addendum)
You are doing great overall Please continue to use your voltaren gel and do your exercises with your shoulder Please continue your Xarelto. We will discuss stopping this at your next appointment Please call me if your leg starts to become red, or irritated again as you will likely need antibiotics  Have a great trip
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
## 2013-08-07 NOTE — Assessment & Plan Note (Signed)
Resolved. Pt aware of warning signs of returning cellulitis and will call for refill on abx

## 2013-08-07 NOTE — Assessment & Plan Note (Signed)
Controlled today No change

## 2013-09-05 ENCOUNTER — Encounter: Payer: Self-pay | Admitting: Family Medicine

## 2013-09-05 ENCOUNTER — Ambulatory Visit (INDEPENDENT_AMBULATORY_CARE_PROVIDER_SITE_OTHER): Payer: Medicare HMO | Admitting: Family Medicine

## 2013-09-05 VITALS — BP 130/79 | HR 69 | Temp 97.7°F | Ht 71.0 in | Wt 192.3 lb

## 2013-09-05 DIAGNOSIS — I82409 Acute embolism and thrombosis of unspecified deep veins of unspecified lower extremity: Secondary | ICD-10-CM

## 2013-09-05 DIAGNOSIS — M67919 Unspecified disorder of synovium and tendon, unspecified shoulder: Secondary | ICD-10-CM

## 2013-09-05 DIAGNOSIS — M7580 Other shoulder lesions, unspecified shoulder: Secondary | ICD-10-CM

## 2013-09-05 DIAGNOSIS — L989 Disorder of the skin and subcutaneous tissue, unspecified: Secondary | ICD-10-CM

## 2013-09-05 DIAGNOSIS — M2170 Unequal limb length (acquired), unspecified site: Secondary | ICD-10-CM

## 2013-09-05 DIAGNOSIS — M719 Bursopathy, unspecified: Secondary | ICD-10-CM

## 2013-09-05 NOTE — Assessment & Plan Note (Signed)
Much improvement Cont voltaren gel and exercises

## 2013-09-05 NOTE — Patient Instructions (Addendum)
You are doing great overall Please use the heal cup in your left shoe Please stop your Xarelto but be on the lookout for signs of another DVT Please come back in June for your next check up or sooner if needed

## 2013-09-05 NOTE — Assessment & Plan Note (Signed)
Heal cup provided for L shoe

## 2013-09-05 NOTE — Progress Notes (Addendum)
Richard Moses is a 80 y.o. male who presents to [facility] today for DVT and wound check  DVT: just returned from [place]. Denies any leg swelling or CP, Palpitations. Taking xarelto.  Wound: Skin cancer reomoved. W/o recurrence. Wound healing well. Denies any pain or itching or discharge.  L shoulder pain: still doing exercises. Nearly back to baseline w/ ROM. Minimal pain. Still using voltaren gel w/ significant pain relief  HTN: no CP.   The following portions of the patient's history were reviewed and updated as appropriate: allergies, current medications, past medical history, family and social history, and problem list.  Patient is a nonsmoker.  Past Medical History  Diagnosis Date  . Diabetes mellitus   . Hypertension   . Hyperlipidemia   . Neck pain on left side 2012  . Chronic left-sided headaches   . MRI of brain abnormal 09/29/10    Abnormal MRI of brain, demonstrating mild atrophy  . 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 Abnormal MRI, cervical spine 09/29/10    Mildly abnormal MRI cervical spine demonstrating mild spondylosis and disc bulging from C4-5 down to C7-T1.  No spinal stenosis or foraminal narrowing  . Hypothyroidism 09/29/10    Untreated. Patient not interested in Rx.   . Colon polyp 2007  . External hemorrhoids without mention of complication [id number]    ROS as above otherwise neg.    Medications reviewed. Current Outpatient Prescriptions  Medication Sig Dispense Refill  . Alpha-Lipoic Acid 300 MG TABS Take 600 mg by mouth 3 (three) times daily.      . B Complex-C (B-COMPLEX WITH VITAMIN C) tablet Take 1 tablet by mouth daily.      Jolyne Loa Grape-Goldenseal (BERBERINE COMPLEX PO) Take 500 mg by mouth 3 (three) times daily with meals.      . cholecalciferol (VITAMIN D) 400 UNITS TABS tablet Take 1,000 Units by mouth 3 (three) times daily with meals.      . Chromium Picolinate 200 MCG CAPS Take 66 mcg by mouth 3 (three) times daily with  meals.      Marland Kitchen DHEA 25 MG CAPS Take 1 capsule by mouth daily.      . diclofenac sodium (VOLTAREN) 1 % GEL Apply 2 g topically 4 (four) times daily.  100 g  0  . Flaxseed, Linseed, (FLAXSEED OIL) 1000 MG CAPS Take 1 capsule by mouth daily.      . Grape Seed Extract 100 MG CAPS Take 150 mg by mouth daily.      . Iodine, Kelp, 0.15 MG TABS Take 2 tablets by mouth daily.      . Iodine, Kelp, 0.15 MG TABS Take 1 tablet by mouth daily.      . Lutein 20 MG TABS Take 1 tablet by mouth daily.      . Mag Oxide-Vit D3-Turmeric (MAGNESIUM-VITAMIN D3-TURMERIC PO) Take 2 tablets by mouth daily.      . Melatonin 3 MG TABS Take 1 tablet by mouth at bedtime as needed.      . Methylcobalamin (B-12) 5000 MCG TBDP Take 1 tablet by mouth daily.      . Misc Natural Products (OSTEO BI-FLEX ADV TRIPLE ST PO) Take 1 tablet by mouth 2 (two) times daily.      . Misc Natural Products (PROSTATE SUPPORT PO) Take 1 capsule by mouth daily.      . Multiple Vitamin (MULTIVITAMIN) tablet Take 1 tablet by mouth daily.      Marland Kitchen  Multiple Vitamins-Minerals (MENS MULTIVITAMIN PLUS) TABS Take 1 tablet by mouth daily after breakfast.      . nitroGLYCERIN (NITRO-DUR) 0.1 mg/hr patch Cut patch into quarters and apply one quarter patch to achilles tendon area. Remove and repeat daily  30 patch  12  . Rivaroxaban (XARELTO) 20 MG TABS tablet Take 1 tablet (20 mg total) by mouth daily.  90 tablet  1  . saw palmetto 80 MG capsule Take 80 mg by mouth daily.      Marland Kitchen sulfamethoxazole-trimethoprim (BACTRIM DS) 800-160 MG per tablet Take 1 tablet by mouth 2 (two) times daily.  20 tablet  0  . vitamin A 10000 UNIT capsule Take 40,000 Units by mouth daily.      . Vitamin D, Ergocalciferol, (DRISDOL) 50000 UNITS CAPS capsule Take 50,000 Units by mouth daily.      Marland Kitchen VITAMIN D-VITAMIN K PO Take 2 tablets by mouth daily.      . Zeaxanthin POWD Take 4 mg by mouth daily.       No current facility-administered medications for this visit.    Exam:  BP  130/79  Pulse 69  Temp(Src) 97.7 F (36.5 C) (Oral)  Ht 5\' 11"  (1.803 m)  Wt 192 lb 4.8 oz (87.227 kg)  BMI 26.83 kg/m2 Gen: Well NAD HEENT: EOMI,  MMM MSK: L leg ~1cm shorter than R SKin: 3 absorbable sutures removed from L leg incision site.   No results found for this or any previous visit (from the past 72 hour(s)).  A/P (as seen in Problem list)  DVT (deep venous thrombosis) Stop xarelto  Look for signs of recurrence   Skin lesion Resolved clearn margins on excision Well healing Vitamin E for scar reduction  Rotator cuff tendonitis Much improvement Cont voltaren gel and exercises  Leg length discrepancy Heal cup provided for L shoe

## 2013-09-05 NOTE — Assessment & Plan Note (Signed)
Stop xarelto  Look for signs of recurrence

## 2013-09-05 NOTE — Assessment & Plan Note (Signed)
Resolved clearn margins on excision Well healing Vitamin E for scar reduction

## 2013-11-05 ENCOUNTER — Ambulatory Visit (INDEPENDENT_AMBULATORY_CARE_PROVIDER_SITE_OTHER): Payer: Medicare HMO | Admitting: Family Medicine

## 2013-11-05 ENCOUNTER — Encounter: Payer: Self-pay | Admitting: Family Medicine

## 2013-11-05 VITALS — BP 146/77 | HR 93 | Temp 98.5°F | Wt 188.0 lb

## 2013-11-05 DIAGNOSIS — I82409 Acute embolism and thrombosis of unspecified deep veins of unspecified lower extremity: Secondary | ICD-10-CM

## 2013-11-05 DIAGNOSIS — E1149 Type 2 diabetes mellitus with other diabetic neurological complication: Secondary | ICD-10-CM

## 2013-11-05 DIAGNOSIS — M7580 Other shoulder lesions, unspecified shoulder: Secondary | ICD-10-CM

## 2013-11-05 DIAGNOSIS — M6789 Other specified disorders of synovium and tendon, multiple sites: Secondary | ICD-10-CM

## 2013-11-05 DIAGNOSIS — M2170 Unequal limb length (acquired), unspecified site: Secondary | ICD-10-CM

## 2013-11-05 DIAGNOSIS — M67919 Unspecified disorder of synovium and tendon, unspecified shoulder: Secondary | ICD-10-CM

## 2013-11-05 DIAGNOSIS — M7660 Achilles tendinitis, unspecified leg: Secondary | ICD-10-CM

## 2013-11-05 DIAGNOSIS — M719 Bursopathy, unspecified: Secondary | ICD-10-CM

## 2013-11-05 LAB — POCT GLYCOSYLATED HEMOGLOBIN (HGB A1C): HEMOGLOBIN A1C: 6.8

## 2013-11-05 MED ORDER — NITROGLYCERIN 0.1 MG/HR TD PT24: MEDICATED_PATCH | TRANSDERMAL | Status: DC

## 2013-11-05 NOTE — Assessment & Plan Note (Signed)
A1c 6.8 today No change.  Continue diet and exercise

## 2013-11-05 NOTE — Progress Notes (Signed)
Richard Moses is a 80 y.o. male who presents to [facility] today for DM f/u.  DM: Does not check CBG at home. Exercises routinely and watches what he eats.   Rotator Cuff: L shoulder flaring again. Difficulty reaching overhead and getting dressed in the morning. Improves voltaren gel improves. Fell on enxtended arm last year. Denies any change in sensation or strenght.   HTN: BP at home around 120-130/60-70  DVT:Off xarelto.Denies CP, SOB, Syncope, Leg swelling.   Heel pain: improved   The following portions of the patient's history were reviewed and updated as appropriate: allergies, current medications, past medical history, family and social history, and problem list.  Patient is a nonsmoker.  Past Medical History  Diagnosis Date  . Diabetes mellitus   . Hypertension   . Hyperlipidemia   . Neck pain on left side 2012  . Chronic left-sided headaches   . MRI of brain abnormal 09/29/10    Abnormal MRI of brain, demonstrating mild atrophy  . 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 Abnormal MRI, cervical spine 09/29/10    Mildly abnormal MRI cervical spine demonstrating mild spondylosis and disc bulging from C4-5 down to C7-T1.  No spinal stenosis or foraminal narrowing  . Hypothyroidism 09/29/10    Untreated. Patient not interested in Rx.   . Colon polyp 2007  . External hemorrhoids without mention of complication [id number]    ROS as above otherwise neg.    Medications reviewed. Current Outpatient Prescriptions  Medication Sig Dispense Refill  . Alpha-Lipoic Acid 300 MG TABS Take 600 mg by mouth 3 (three) times daily.      . B Complex-C (B-COMPLEX WITH VITAMIN C) tablet Take 1 tablet by mouth daily.      Jolyne Loa Grape-Goldenseal (BERBERINE COMPLEX PO) Take 500 mg by mouth 3 (three) times daily with meals.      . cholecalciferol (VITAMIN D) 400 UNITS TABS tablet Take 1,000 Units by mouth 3 (three) times daily with meals.      . Chromium Picolinate 200 MCG CAPS Take  66 mcg by mouth 3 (three) times daily with meals.      Marland Kitchen DHEA 25 MG CAPS Take 1 capsule by mouth daily.      . diclofenac sodium (VOLTAREN) 1 % GEL Apply 2 g topically 4 (four) times daily.  100 g  0  . Flaxseed, Linseed, (FLAXSEED OIL) 1000 MG CAPS Take 1 capsule by mouth daily.      . Grape Seed Extract 100 MG CAPS Take 150 mg by mouth daily.      . Iodine, Kelp, 0.15 MG TABS Take 2 tablets by mouth daily.      . Iodine, Kelp, 0.15 MG TABS Take 1 tablet by mouth daily.      . Lutein 20 MG TABS Take 1 tablet by mouth daily.      . Mag Oxide-Vit D3-Turmeric (MAGNESIUM-VITAMIN D3-TURMERIC PO) Take 2 tablets by mouth daily.      . Melatonin 3 MG TABS Take 1 tablet by mouth at bedtime as needed.      . Methylcobalamin (B-12) 5000 MCG TBDP Take 1 tablet by mouth daily.      . Misc Natural Products (OSTEO BI-FLEX ADV TRIPLE ST PO) Take 1 tablet by mouth 2 (two) times daily.      . Misc Natural Products (PROSTATE SUPPORT PO) Take 1 capsule by mouth daily.      . Multiple Vitamin (MULTIVITAMIN) tablet Take 1  tablet by mouth daily.      . Multiple Vitamins-Minerals (MENS MULTIVITAMIN PLUS) TABS Take 1 tablet by mouth daily after breakfast.      . nitroGLYCERIN (NITRO-DUR) 0.1 mg/hr patch Cut patch into quarters and apply one quarter patch toaffected area. Remove and repeat daily  30 patch  12  . saw palmetto 80 MG capsule Take 80 mg by mouth daily.      . vitamin A 10000 UNIT capsule Take 40,000 Units by mouth daily.      . Vitamin D, Ergocalciferol, (DRISDOL) 50000 UNITS CAPS capsule Take 50,000 Units by mouth daily.      Marland Kitchen VITAMIN D-VITAMIN K PO Take 2 tablets by mouth daily.      . Zeaxanthin POWD Take 4 mg by mouth daily.       No current facility-administered medications for this visit.    Exam:  BP 146/77  Pulse 93  Temp(Src) 98.5 F (36.9 C) (Oral)  Wt 188 lb (85.276 kg) Gen: Well NAD HEENT: EOMI,  MMM MSK: ROM limited by pain w/ behind the head, abduction to 90 degrees and behind the  back.   Results for orders placed in visit on 11/05/13 (from the past 72 hour(s))  POCT GLYCOSYLATED HEMOGLOBIN (HGB A1C)     Status: None   Collection Time    11/05/13  9:17 AM      Result Value Ref Range   Hemoglobin A1C 6.8      A/P (as seen in Problem list)  Rotator cuff tendonitis Flared again Start nitro patch and formal PT Continue voltaren gel Pt does not want steroid injection at this point but amenable if PT and nitro does not work Plain film  Achilles tendon pain Resolved w/ exercises and nitro patches  DIABETES MELLITUS, TYPE II, WITH NEUROLOGICAL COMPLICATIONS [id number] 6.8 today No change.  Continue diet and exercise  DVT (deep venous thrombosis) No signs of recurrence of xarelto.   Leg length discrepancy Improved w/ heel cup

## 2013-11-05 NOTE — Assessment & Plan Note (Addendum)
Flared again Start nitro patch and formal PT Continue voltaren gel Pt does not want steroid injection at this point but amenable if PT and nitro does not work Plain film

## 2013-11-05 NOTE — Assessment & Plan Note (Signed)
No signs of recurrence of xarelto.

## 2013-11-05 NOTE — Patient Instructions (Addendum)
You are doing well overall Great job on your diabetic control Please continue your healthy lifestyle Please start using the nitro patches on your left shoulder Please conitnue the exercises Please follow up with the physical therapist Consider a steroid injection if you cannot tolerate the pain Please come back in 3-4 weeks if not improving.

## 2013-11-05 NOTE — Assessment & Plan Note (Signed)
Resolved w/ exercises and nitro patches

## 2013-11-05 NOTE — Assessment & Plan Note (Signed)
Improved w/ heel cup

## 2013-11-26 ENCOUNTER — Ambulatory Visit: Payer: Medicare HMO | Attending: Family Medicine | Admitting: Physical Therapy

## 2013-11-26 DIAGNOSIS — M25519 Pain in unspecified shoulder: Secondary | ICD-10-CM | POA: Insufficient documentation

## 2013-11-26 DIAGNOSIS — IMO0001 Reserved for inherently not codable concepts without codable children: Secondary | ICD-10-CM | POA: Insufficient documentation

## 2013-11-30 ENCOUNTER — Ambulatory Visit: Payer: Medicare HMO | Admitting: Physical Therapy

## 2013-12-04 ENCOUNTER — Ambulatory Visit: Payer: Medicare HMO | Admitting: Physical Therapy

## 2013-12-06 ENCOUNTER — Ambulatory Visit: Payer: Medicare HMO | Attending: Family Medicine | Admitting: Physical Therapy

## 2013-12-06 DIAGNOSIS — M25519 Pain in unspecified shoulder: Secondary | ICD-10-CM | POA: Diagnosis not present

## 2013-12-06 DIAGNOSIS — IMO0001 Reserved for inherently not codable concepts without codable children: Secondary | ICD-10-CM | POA: Insufficient documentation

## 2013-12-11 ENCOUNTER — Ambulatory Visit: Payer: Medicare HMO | Admitting: Physical Therapy

## 2013-12-11 DIAGNOSIS — IMO0001 Reserved for inherently not codable concepts without codable children: Secondary | ICD-10-CM | POA: Diagnosis not present

## 2013-12-13 ENCOUNTER — Ambulatory Visit: Payer: Medicare HMO | Admitting: Physical Therapy

## 2013-12-13 DIAGNOSIS — IMO0001 Reserved for inherently not codable concepts without codable children: Secondary | ICD-10-CM | POA: Diagnosis not present

## 2013-12-18 ENCOUNTER — Ambulatory Visit: Payer: Medicare HMO | Admitting: Physical Therapy

## 2013-12-18 DIAGNOSIS — IMO0001 Reserved for inherently not codable concepts without codable children: Secondary | ICD-10-CM | POA: Diagnosis not present

## 2013-12-20 ENCOUNTER — Ambulatory Visit: Payer: Medicare HMO | Admitting: Physical Therapy

## 2013-12-20 DIAGNOSIS — IMO0001 Reserved for inherently not codable concepts without codable children: Secondary | ICD-10-CM | POA: Diagnosis not present

## 2014-04-17 ENCOUNTER — Ambulatory Visit (INDEPENDENT_AMBULATORY_CARE_PROVIDER_SITE_OTHER): Payer: Medicare HMO | Admitting: Family Medicine

## 2014-04-17 ENCOUNTER — Encounter: Payer: Self-pay | Admitting: Family Medicine

## 2014-04-17 ENCOUNTER — Ambulatory Visit (INDEPENDENT_AMBULATORY_CARE_PROVIDER_SITE_OTHER): Payer: Medicare HMO | Admitting: *Deleted

## 2014-04-17 VITALS — BP 160/83 | HR 91 | Temp 98.3°F | Ht 71.0 in | Wt 184.9 lb

## 2014-04-17 DIAGNOSIS — E119 Type 2 diabetes mellitus without complications: Secondary | ICD-10-CM

## 2014-04-17 DIAGNOSIS — Z23 Encounter for immunization: Secondary | ICD-10-CM

## 2014-04-17 DIAGNOSIS — I10 Essential (primary) hypertension: Secondary | ICD-10-CM

## 2014-04-17 DIAGNOSIS — R7989 Other specified abnormal findings of blood chemistry: Secondary | ICD-10-CM

## 2014-04-17 DIAGNOSIS — R748 Abnormal levels of other serum enzymes: Secondary | ICD-10-CM

## 2014-04-17 DIAGNOSIS — E785 Hyperlipidemia, unspecified: Secondary | ICD-10-CM

## 2014-04-17 DIAGNOSIS — N185 Chronic kidney disease, stage 5: Secondary | ICD-10-CM | POA: Insufficient documentation

## 2014-04-17 LAB — LIPID PANEL
CHOLESTEROL: 235 mg/dL — AB (ref 0–200)
HDL: 50 mg/dL (ref >39)
LDL Cholesterol: 147 mg/dL — ABNORMAL HIGH (ref 0–99)
TRIGLYCERIDES: 189 mg/dL — AB (ref <150)
Total CHOL/HDL Ratio: 4.7 Ratio
VLDL: 38 mg/dL (ref 0–40)

## 2014-04-17 LAB — BASIC METABOLIC PANEL
BUN: 21 mg/dL (ref 6–23)
CALCIUM: 9.9 mg/dL (ref 8.4–10.5)
CO2: 28 mEq/L (ref 19–32)
Chloride: 102 mEq/L (ref 96–112)
Creat: 1.53 mg/dL — ABNORMAL HIGH (ref 0.50–1.35)
Glucose, Bld: 133 mg/dL — ABNORMAL HIGH (ref 70–99)
Potassium: 4.6 mEq/L (ref 3.5–5.3)
Sodium: 141 mEq/L (ref 135–145)

## 2014-04-17 LAB — POCT GLYCOSYLATED HEMOGLOBIN (HGB A1C): Hemoglobin A1C: 7

## 2014-04-17 NOTE — Patient Instructions (Addendum)
Dear Juliette Mangle, Thank you for coming in to clinic today.  1. For your physical today - it looks like you are doing well overall. We will check basic blood work and cholesterol and discuss this at your next visit. 2. Blood sugar is stable, recommend checking sugar more frequently 3x weekly (fasting in the morning, before breakfast or 2 hours after lunch or dinner), bring readings into next visit 3. Blood pressure is elevated here, we will re-check. Continue to check at home and write down numbers.  Some important numbers from today's visit: HgA1C - 7.0 BP - 160/80  Flu Shot today  Please schedule a follow-up appointment with Dr. Parks Ranger in 3 months for Diabetes follow-up, or sooner if needed.  If you have any other questions or concerns, please feel free to call the clinic to contact me. You may also schedule an earlier appointment if necessary.  However, if your symptoms get significantly worse, please go to the Emergency Department to seek immediate medical attention.  Nobie Putnam, [place]

## 2014-04-17 NOTE — Assessment & Plan Note (Signed)
Well-controlled HTN No complications   Plan:  1. No intervention 2. Check BMET, follow serum Cr (last 12/2012, 0.98 with b/l 0.9 to 1.0) - reviewed results with elevated Cr to 1.5, suspected AKI however unclear if new elevated baseline 3. Lifestyle Mods - Exercise, Dec salt intake, inc K+ rich vegs

## 2014-04-17 NOTE — Assessment & Plan Note (Signed)
Well-controlled. Current HgbA1c 7.0 (04/17/14), last HgbA1c 6.8 (11/05/13). No complications or hypoglycemia.  Plan:  1. Continue diet / lifestyle control - continue to improve regular exercise 2. No plans to start medical therapy at this time 3. Consider low dose ACEi/ARB for renal protection in future 4. Referral to Ophthalmology for annual DM eye exam 5. RTC 3 months for A1c can space out to 6 mo if stable

## 2014-04-17 NOTE — Progress Notes (Signed)
   Subjective:    Patient ID: Richard Moses, male    DOB: [birth year], 80 y.o.   MRN: [id number]  Patient presents for annual physical.  HPI   CHRONIC DM, Type 2: Reports no concerns CBGs: Avg 125, High 173. Checks CBGs - once every other week Meds: supplements only Reports good compliance. Tolerating well w/o side-effects Not on ACEi/ARB Lifestyle: Diet (avoids sugars, no sodas and teas) / Exercise (not regular, plan to get back to [facility], some stationary bike riding helps knees) Previously followed by Ophthalmology, has not been in a while, request referral Denies hypoglycemia, polyuria, visual changes, numbness or tingling.  CHRONIC HTN: Reports no concerns. Checks BP at home regularly 125/84. Current Meds - no anti-HTN medications previously.     Denies CP, dyspnea, HA, edema, dizziness / lightheadedness  DYSLIPIDEMIA: - Hx abnormal lipid panel, last checked 2013 - Currently no statin therapy, however on supplements and omega-3, flaxseed oil, DHEA  PMH: - Hx DVT 2014 (dx following fall), treated with Coumadin >> Xarelto - Hx Squamous Cell CA (surgically excised 07/2013, confirmed on path report, free margins)  HM: - Requests influenza vaccine today  I have reviewed and updated the following as appropriate: allergies and current medications  Social Hx: - Never smoker  Review of Systems  See above HPI    Objective:   Physical Exam  BP 160/83 mmHg  Pulse 91  Temp(Src) 98.3 F (36.8 C) (Oral)  Ht 5\' 11"  (1.803 m)  Wt 184 lb 14.4 oz (83.87 kg)  BMI 25.80 kg/m2   Re-check BP manual 156/80  Gen - well-appearing, conversational, NAD HEENT - NCAT, PERRL, EOMI, oropharynx clear, MMM Neck - supple, non-tender Heart - RRR, no murmurs heard Lungs - CTAB, no wheezing, crackles, or rhonchi. Normal work of breathing. Ext - non-tender, no edema, peripheral pulses intact +2 b/l Skin - warm, dry, no rashes Neuro - awake, alert, oriented, grossly non-focal, intact muscle  strength 5/5 b/l, intact distal sensation to light touch, gait normal    Assessment & Plan:   See specific A&P problem list for details.

## 2014-04-17 NOTE — Assessment & Plan Note (Addendum)
Elevated Cr on screening BMET for HTN, may be consistent with AKI however appeared euvolemic on exam, may be elevated new baseline Cr  1. Call patient with results, advise increase PO hydration, discontinue nephrotoxic meds including NSAIDs 2. Recommend RTC for re-evaluation

## 2014-04-17 NOTE — Assessment & Plan Note (Signed)
Previously elevated lipids  Plan: 1. Check fasting lipid panel today, monitoring in setting of DM2 2. Consider discussing future statin therapy if indicated 3. Continue supplements and exercise

## 2014-04-18 ENCOUNTER — Telehealth: Payer: Self-pay | Admitting: Family Medicine

## 2014-04-18 NOTE — Telephone Encounter (Signed)
Last OV 04/17/14, checked BMET for annual screening, without prior hx known CKD, reviewed recent BMET results with elevated Cr up to 1.53 (prev b/l 0.9 to 1, last checked 12/2012). See results below:  Basic Metabolic Panel:    Component Value Date/Time   NA 141 04/17/2014 1137   K 4.6 04/17/2014 1137   CL 102 04/17/2014 1137   CO2 28 04/17/2014 1137   BUN 21 04/17/2014 1137   CREATININE 1.53* 04/17/2014 1137   CREATININE 1.10 03/24/2009 2026   GLUCOSE 133* 04/17/2014 1137   CALCIUM 9.9 04/17/2014 1137   Called patient to notify of results, considered AKI vs new baseline elevated Cr. Unclear etiology for elevation. Advised to significantly increase PO hydration, discontinued all NSAIDs (pt only taking infrequent Advil-PM), reviewed extensive list of herbal / supplement meds and advised may need to discontinue these in future. Patient scheduled for close f/u on Monday 04/22/14, will repeat BMET at that time. Patient understood, RTC as advised.  Nobie Putnam, [place], PGY-2

## 2014-04-22 ENCOUNTER — Ambulatory Visit (INDEPENDENT_AMBULATORY_CARE_PROVIDER_SITE_OTHER): Payer: Medicare HMO | Admitting: Family Medicine

## 2014-04-22 ENCOUNTER — Encounter: Payer: Self-pay | Admitting: Family Medicine

## 2014-04-22 VITALS — BP 155/75 | HR 93 | Temp 97.6°F | Ht 71.0 in | Wt 184.4 lb

## 2014-04-22 DIAGNOSIS — R339 Retention of urine, unspecified: Secondary | ICD-10-CM | POA: Insufficient documentation

## 2014-04-22 DIAGNOSIS — N139 Obstructive and reflux uropathy, unspecified: Secondary | ICD-10-CM

## 2014-04-22 DIAGNOSIS — R399 Unspecified symptoms and signs involving the genitourinary system: Secondary | ICD-10-CM

## 2014-04-22 DIAGNOSIS — R7989 Other specified abnormal findings of blood chemistry: Secondary | ICD-10-CM

## 2014-04-22 DIAGNOSIS — R748 Abnormal levels of other serum enzymes: Secondary | ICD-10-CM

## 2014-04-22 DIAGNOSIS — M79671 Pain in right foot: Secondary | ICD-10-CM | POA: Insufficient documentation

## 2014-04-22 LAB — POCT URINALYSIS DIPSTICK
BILIRUBIN UA: NEGATIVE
GLUCOSE UA: NEGATIVE
Ketones, UA: NEGATIVE
Nitrite, UA: NEGATIVE
SPEC GRAV UA: 1.01
Urobilinogen, UA: 0.2
pH, UA: 6.5

## 2014-04-22 LAB — POCT UA - MICROSCOPIC ONLY

## 2014-04-22 MED ORDER — TAMSULOSIN HCL 0.4 MG PO CAPS: 0.4000 mg | ORAL_CAPSULE | Freq: Every day | ORAL | Status: DC

## 2014-04-22 MED ORDER — CIPROFLOXACIN HCL 500 MG PO TABS: 500.0000 mg | ORAL_TABLET | Freq: Two times a day (BID) | ORAL | Status: DC

## 2014-04-22 NOTE — Assessment & Plan Note (Signed)
Suspected partial urinary outflow obstruction due to enlarged prostate, and likely with stasis and secondary UTI - Acute oliguria over past 2-3 days, UTI / LUTS symptoms, palpable distended uncomfortable bladder - Patient deferred on prostate exam, discussed previous DRE findings with known "grossly enlarged prostate, nodular, hard, asymmetric R>L" on 11/2012 - Hx elevated PSA 5.42 (2010), 6.39 (2014)  Plan: 1. Urgent referral to Urology for evaluation of suspected partial urinary obstruction, eval with PVR and further testing, work-up prostate 2. Start Flomax 0.4mg  daily, empirically if component of BPH 3. Treat presumed UTI, f/u urine culture 4. Strongly advised close f/u within 1-2 days return to [facility] to evaluate urination / bladder obstruction. Given strict red flags if unable to void, worsening abdominal pain, n/v, go directly to ER for immediate medical attention

## 2014-04-22 NOTE — Patient Instructions (Addendum)
Dear Juliette Mangle, Thank you for coming in to clinic today.  1. For your Right Toe - we will order an X-ray, you may get this done at [facility] (go to Radiology department) any point in the future 2. For your Bladder and Urinary Tract - It looks like you have an infection (UTI) - treat with Cipro 500mg  twice daily for 7 days (antibiotic) 3. We will send urine for a culture and contact you if it needs to be changed 4. We will check your blood work today - to follow-up your Kidney Function 5. I think that you are having some Urinary Obstruction due to your prostate - we will start another medicine Flomax to help you urinate  Please stop taking all of your Supplement medications for now.  I will refer you to a Urologist - hope to get you in soon, this may take a few days to weeks  If you are having significant worsening with your Urination - unable to pee or very small amounts, burning, abdominal pain, fullness or fevers/chills please go directly to the Emergency Department, may need to drain your bladder.  Please schedule a follow-up appointment with Dr. Parks Ranger or any available provider within 2 days to follow-up and see if you are improving.  If you have any other questions or concerns, please feel free to call the clinic to contact me. You may also schedule an earlier appointment if necessary.  However, if your symptoms get significantly worse, please go to the Emergency Department to seek immediate medical attention.  Nobie Putnam, [place]

## 2014-04-22 NOTE — Assessment & Plan Note (Addendum)
Previously elevated Cr, now with suspected clinical bladder outlet obstruction likely due to enlarged prostate, especially given low BUN:Cr 1.3  Plan: 1. Re-check BMET to monitor Cr trend 2. Hold ALL supplements at this time (unclear side-effects on kidneys, goal to avoid nephrotoxicity) 3. Treating UTI and initiating prostate therapy with Flomax 4. Urgent Urology referral given relative obstruction (still passing urine) 5. Discussed may need to go to ED or hospitalized if persistent worsening kidney function, due to obstruction or AKI

## 2014-04-22 NOTE — Assessment & Plan Note (Signed)
Suspected chronic pain due to prior stub impact injury >1 year ago with possible related to progressive hammertoe deformity. - No prior work-up. No acute evidence of fracture or neurological injury.  Plan: 1. Ordered Right foot complete X-ray, eval R 3rd toe and MTP 2. If X-ray negative, consider conservative treatment for support for hammertoe

## 2014-04-22 NOTE — Progress Notes (Signed)
   Subjective:    Patient ID: Richard Moses, male    DOB: [birth year], 80 y.o.   MRN: [id number]   HPI   RIGHT FOOT AND 3rd TOE PAIN: - Reports chronic hx >1 year of right front foot pain localized to 3rd toe, suspected inciting injury 1 year ago when he "stubbed toe" on a table leg with significant pain at that time, never treated and never had X-rays. - States that it hurts when he walks, described pain as an aching pain worse with ambulation and weight bearing on R foot. Localized to "top knuckle, of middle toe" and front "ball of foot" - Never taken any medications for this injury - Denies any swelling, redness, numbness, weakness, or tingling  UTI symptoms / LUTS / Hx Enlarged Prostate / Elevated PSA: - Reports never had prior UTI before, has had some "urinary itching" about 2 months ago, seemed to go away with cranberry juice, no burning at that time. Currently for past 3 days, has had significant "itching" again without dysuria, but has some "milky or cloudy urine". Also admits to some uncomfortable lower pelvic pressure, when press on it "felt like had to pee again". Does report hx enlarged prostate with some similar symptoms over past few years. Takes supplements for prostate health ([place] program). - Has hydrated since last visit 11/11 (phone call discussing inc serum creatinine), avoiding NSAIDs - Admits to nocturia, frequency, difficult to start stream, decreased urination amount (reportedly 2 oz per void now, previously 10, but going more frequently) - Not followed by Urology - Denies unable to void, significant abdominal pain, nausea / vomiting, penile discharge, odor, rash, unintentional wt loss  I have reviewed and updated the following as appropriate: allergies and current medications  Social Hx: - Never smoker  Review of Systems  See above HPI    Objective:   Physical Exam  BP 155/75 mmHg  Pulse 93  Temp(Src) 97.6 F (36.4 C) (Oral)  Ht 5\' 11"  (1.803  m)  Wt 184 lb 6.4 oz (83.643 kg)  BMI 25.73 kg/m2   Gen - well-appearing, pleasant, NAD HEENT - MMM Heart - RRR, no murmurs heard Abd - soft, non-tender or distended, except moderately firm and distended bladder, active +BS Ext - Bilateral Feet: no significant asymmetry or structural defect of Right 3rd toe, bilateral toes with similar high arched appearance, no erythema or edema, mildly tender to palpation at R 3rd MTP base, firm palpable callus on dorsal aspect of MTP, otherwise bilateral feet non-tender and normal appearing. Intact active ROM. MSK - No CVAT Skin - warm, dry, no rashes Neuro - awake, alert, oriented, grossly non-focal    Assessment & Plan:   See specific A&P problem list for details.

## 2014-04-23 ENCOUNTER — Telehealth: Payer: Self-pay | Admitting: Family Medicine

## 2014-04-23 LAB — URINE CULTURE
Colony Count: NO GROWTH
ORGANISM ID, BACTERIA: NO GROWTH

## 2014-04-23 LAB — BASIC METABOLIC PANEL
BUN: 19 mg/dL (ref 6–23)
CALCIUM: 9.1 mg/dL (ref 8.4–10.5)
CO2: 25 meq/L (ref 19–32)
Chloride: 105 mEq/L (ref 96–112)
Creat: 1.62 mg/dL — ABNORMAL HIGH (ref 0.50–1.35)
GLUCOSE: 115 mg/dL — AB (ref 70–99)
Potassium: 4.3 mEq/L (ref 3.5–5.3)
SODIUM: 142 meq/L (ref 135–145)

## 2014-04-23 NOTE — Telephone Encounter (Signed)
Last OV 11/16, called patient to discuss results and check on patient. Repeat BMET on 11/16 with continued elevated Cr up to 1.62 (previously 1.53 on 11/11), concerning for persistent urinary outflow obstruction due to suspected enlarged prostate. Patient yesterday started on Cipro for UTI and Flomax for concerns with enlarged prostate (last DRE 1 year ago documented firm, hard, and asymmetric with some nodularity).  Spoke with patient today, he states that within past 24 hours has seen some notable improvement in urinary stream, some reduced bladder pressure and fullness, but overall still admitting to decreased urine per void. Admits to frequency and nocturia. No dysuria, hematuria, fevers/chills. Admits to compliance with Cipro and Flomax. Urine culture still pending at this time.  Yesterday, an Urgent Urology referral was placed for new patient evaluation with concerns of urinary retention and rising Cr. [facility] today around 1400, spoke with Robbin (triage nurse), records were faxed from [facility], she plans to call patient to schedule a Urology appointment as soon as possible, within 1 week. Attempted to call back patient but unable to reach him.  He is scheduled to follow-up in [facility] tomorrow with Dr. Nori Riis at 10:00am.  Nobie Putnam, [place], PGY-2

## 2014-04-24 ENCOUNTER — Encounter: Payer: Self-pay | Admitting: Family Medicine

## 2014-04-24 ENCOUNTER — Ambulatory Visit (INDEPENDENT_AMBULATORY_CARE_PROVIDER_SITE_OTHER): Payer: Medicare HMO | Admitting: Family Medicine

## 2014-04-24 ENCOUNTER — Ambulatory Visit (HOSPITAL_COMMUNITY)
Admission: RE | Admit: 2014-04-24 | Discharge: 2014-04-24 | Disposition: A | Discharge status: Home / Self Care | Payer: Medicare HMO | Source: Ambulatory Visit | Attending: Family Medicine | Admitting: Family Medicine

## 2014-04-24 VITALS — BP 124/74 | HR 113 | Temp 98.8°F | Ht 71.0 in | Wt 183.0 lb

## 2014-04-24 DIAGNOSIS — N139 Obstructive and reflux uropathy, unspecified: Secondary | ICD-10-CM

## 2014-04-24 DIAGNOSIS — M79671 Pain in right foot: Secondary | ICD-10-CM | POA: Insufficient documentation

## 2014-04-24 NOTE — Assessment & Plan Note (Signed)
I would continue his current medications of Cipro and Flomax. Follow-up with urology in the morning. He seems to be slightly improving.

## 2014-04-24 NOTE — Progress Notes (Signed)
   Subjective:    Patient ID: Richard Moses, male    DOB: 1933/05/15, 80 y.o.   MRN: [id number]  HPI Follow-up recent episode of urinary retention. Was placed on antibiotic and Flomax and has noticed some improvement. He has urology appointment in the morning. Has seen no blood in his urine. The urine was initially sort of cloudy but now looks clear. Still has some sense of discomfort in the pelvis when he stands but none when he sits.   Review of Systems See history of present illness    Objective:   Physical Exam Vital signs are reviewed GENERAL: Well-developed male no acute distress ABDOMEN: Soft, positive bowel sounds. Slightly tender in the right supra pelvic area there is no mass here, no rebound. Bowel sounds are normal.       Assessment & Plan:

## 2014-04-25 ENCOUNTER — Other Ambulatory Visit: Payer: Self-pay | Admitting: Family Medicine

## 2014-04-25 DIAGNOSIS — R399 Unspecified symptoms and signs involving the genitourinary system: Secondary | ICD-10-CM

## 2014-04-25 NOTE — Telephone Encounter (Signed)
Per Dr. Parks Ranger:  Thank you for the information regarding Mr. Richard Moses. He does not need a new refill on Cipro. His last visit with Urine culture on 04/22/14 showed "No Growth", therefore we would just complete his current course that he has and not extend it at this time. He should still plan to follow-up with Urology as scheduled on 05/06/14.   Also, could you remind him to request for their notes to be sent to our office as well.   Thank you,  Nobie Putnam, [place], PGY-2

## 2014-04-25 NOTE — Telephone Encounter (Signed)
Pt needs cipro refilled, goes to go [facility]. Pt also wants to speak with MD about his appt at the urologist.

## 2014-04-25 NOTE — Telephone Encounter (Signed)
Returned call to patient.  Patient went to urologist and had catheter inserted this morning.  Has an appt to go back to urologist to be rechecked on 05/06/14.  Was given Cipro BID x 1 week and has enough to last x 3 days (through Sunday).  Wants to know if he needs to continue Cipro until he returns to his with urologist on 11/30.  If so, will need refill.  Still taking Flomax and does not need refill.   Will route request to Dr. Parks Ranger and call patient back tomorrow.  Burna Forts, BSN, RN-BC

## 2014-04-26 ENCOUNTER — Encounter: Payer: Self-pay | Admitting: Family Medicine

## 2014-04-26 DIAGNOSIS — N1330 Unspecified hydronephrosis: Secondary | ICD-10-CM | POA: Insufficient documentation

## 2014-04-26 DIAGNOSIS — R339 Retention of urine, unspecified: Secondary | ICD-10-CM | POA: Insufficient documentation

## 2014-04-29 ENCOUNTER — Telehealth: Payer: Self-pay | Admitting: *Deleted

## 2014-04-29 NOTE — Telephone Encounter (Signed)
Pt states that he still has his catheter in and "ran out of cipro yesterday"  Wants to know if MD is going to refill this medication.  States that he is still having some pain.  Advised I would send message back to his Nurses. Viktorya Arguijo, [place]

## 2014-04-29 NOTE — Telephone Encounter (Signed)
Reviewed chart. Patient previously advised that plan was to not refill Cipro at this time. Last urine culture on 04/22/14 was "no growth". Recommend complete current course (should already be done) and then follow-up with Urology as scheduled for Foley removal, and advise to follow-up with them regarding urinary symptoms.  Richard Moses, [place], PGY-2

## 2014-04-30 NOTE — Telephone Encounter (Signed)
Patient informed of message per Dr. Parks Ranger (see below) and states he is still having dysuria and itching. States he will follow up with urologist regarding symptoms at his appt on 05/06/14 and also have them send records to our office.  Burna Forts, BSN, RN-BC

## 2014-04-30 NOTE — Telephone Encounter (Signed)
Agree with plan below.

## 2014-05-07 NOTE — Telephone Encounter (Signed)
Pt returning call

## 2014-05-07 NOTE — Telephone Encounter (Signed)
Pt states that he had his catheter taken out yesterday.  He is aware that he needs to call urology and inform them of the current problems he is having today.  States that he is still in pain from having this taken out yesterday. Prentiss Hammett,CMA

## 2014-05-07 NOTE — Telephone Encounter (Signed)
Left message on voicemail for patient to call back. 

## 2014-06-10 ENCOUNTER — Ambulatory Visit (INDEPENDENT_AMBULATORY_CARE_PROVIDER_SITE_OTHER): Payer: Medicare HMO | Admitting: Family Medicine

## 2014-06-10 VITALS — BP 133/80 | HR 118 | Temp 98.2°F | Ht 71.0 in | Wt 156.0 lb

## 2014-06-10 DIAGNOSIS — N179 Acute kidney failure, unspecified: Secondary | ICD-10-CM

## 2014-06-10 DIAGNOSIS — R300 Dysuria: Secondary | ICD-10-CM

## 2014-06-10 DIAGNOSIS — N139 Obstructive and reflux uropathy, unspecified: Secondary | ICD-10-CM

## 2014-06-10 DIAGNOSIS — N3001 Acute cystitis with hematuria: Secondary | ICD-10-CM

## 2014-06-10 DIAGNOSIS — R339 Retention of urine, unspecified: Secondary | ICD-10-CM

## 2014-06-10 LAB — BASIC METABOLIC PANEL
BUN: 31 mg/dL — ABNORMAL HIGH (ref 6–23)
CALCIUM: 9.2 mg/dL (ref 8.4–10.5)
CO2: 20 mEq/L (ref 19–32)
Chloride: 104 mEq/L (ref 96–112)
Creat: 2.26 mg/dL — ABNORMAL HIGH (ref 0.50–1.35)
Glucose, Bld: 140 mg/dL — ABNORMAL HIGH (ref 70–99)
POTASSIUM: 4.3 meq/L (ref 3.5–5.3)
Sodium: 137 mEq/L (ref 135–145)

## 2014-06-10 LAB — POCT URINALYSIS DIPSTICK
BILIRUBIN UA: NEGATIVE
Glucose, UA: NEGATIVE
KETONES UA: NEGATIVE
Nitrite, UA: NEGATIVE
PH UA: 6
Protein, UA: 30
Spec Grav, UA: 1.015
Urobilinogen, UA: 0.2

## 2014-06-10 LAB — POCT UA - MICROSCOPIC ONLY

## 2014-06-10 MED ORDER — CEPHALEXIN 500 MG PO CAPS
500.0000 mg | ORAL_CAPSULE | Freq: Four times a day (QID) | ORAL | Status: DC
Start: 2014-06-10 — End: 2014-09-19

## 2014-06-10 NOTE — Patient Instructions (Signed)
Dear Juliette Mangle, Thank you for coming in to clinic today.  1. We checked your Urine, it looks concerning for an infection. We will send a culture. Start taking Keflex 500mg  4 times daily for 7 days for infection. We will call you if need to change it. 2. Please go to [facility] on 06/14/14 to follow-up. You may inform them that you plan to change to [facility] afterwards, and have records sent. Please call [facility] to schedule your appointment, if you are unable to schedule it or need a new referral please call our clinic and let us know. I will place one. But you do need long-term Urology follow-up. 3. For your weight loss and symptoms, I think this is all related to poor dietary habits. - You need to work on 3 regular meals daily and 2 snacks. Even if food is not tasting right, it is important for your health. - Try drinking Ensure protein shake supplement 2 times daily between meals  We will check labs today.  Go ahead and resume all of your supplement medicines. If there is a problem with your labs I will call you to let you know.  Please schedule a follow-up appointment with me (Dr. Parks Ranger) in 1 month for follow-up weight.  If you have any other questions or concerns, please feel free to call the clinic to contact me. You may also schedule an earlier appointment if necessary.  However, if your symptoms get significantly worse, please go to the Emergency Department to seek immediate medical attention.  Nobie Putnam, [place]

## 2014-06-10 NOTE — Progress Notes (Signed)
   Subjective:    Patient ID: BRYSHAWN SWAGGER, male    DOB: 01/20/1933, 81 y.o.   MRN: [id number]  HPI  DECREASED APPETITE / UNINTENTIONAL WEIGHT LOSS: - Patient presents for follow-up reporting multiple complaints over the past 1 month, reporting significantly reduced appetite and very limited PO intake, stating "food doesn't taste good" but does like eating mainly "[place] with salmon and vegetables", poor sleep, losing weight, weakness and fatigue easy, increased thirst - Reports some decreased drinking due to concern about frequently urinating  - Review vitals with significant weight loss 183 (04/24/14) to 156 lbs (today, 06/10/14) - Denies any fevers/chills, night-sweats, nausea / vomiting, dysphagia, abdominal pain  URINARY FREQ / CLOUDY URINE / URETHRAL PAIN s/p foley catheter: - Reports last visit with [facility] 05/06/14 (previously referred for concern of urinary outflow obstruction and elevated Cr, found to have b/l hydroureter on CT urogram and enlarged middle lobe prostate). Had persistent urinary retention off of foley cath trial but refused persistent catheter placement, given Rapaflo. Describes significantly painful episode of foley catheter insertion, reported "without any lubricant", stated he wore catheter for 12 days,  - Continues to take Rapaflo to help with urinary flow (much improved), schedule to f/u with urology 06/14/14 (requesting [facility]). Increased urinary frequency, nocturia. - Admits to change in color of urine about 1 week ago (clear amber to cloudy gray) - Last [facility] visit 11/16 and 11/18, treated with Cipro 500mg  BID x 7 days for UTI - Occasionally wakes up at night due to sharp pains within urethra, otherwise does not have chronic urethral pain - Denies dysuria, hematuria, flank or abd pain  I have reviewed and updated the following as appropriate: allergies and current medications  Social Hx: - Never smoker  Review of Systems  See above HPI      Objective:   Physical Exam  BP 133/80 mmHg  Pulse 118  Temp(Src) 98.2 F (36.8 C) (Oral)  Ht 5\' 11"  (1.803 m)  Wt 156 lb (70.761 kg)  BMI 21.77 kg/m2  Gen - well-appearing but thin with noticeable weight loss since last visit, conversational, NAD HEENT - MMM Heart - RRR, no murmurs heard Abd - soft, non-tender or distended, except mildly firm palpable and possibly distended bladder in lower abdomen / pelvis (significantly improved since last visit), active +BS MSK - No CVAT Skin - warm, dry, no rashes Neuro - awake, alert, oriented, grossly non-focal     Assessment & Plan:   See specific A&P problem list for details.

## 2014-06-11 ENCOUNTER — Encounter: Payer: Self-pay | Admitting: Family Medicine

## 2014-06-11 DIAGNOSIS — N390 Urinary tract infection, site not specified: Secondary | ICD-10-CM | POA: Insufficient documentation

## 2014-06-11 NOTE — Assessment & Plan Note (Addendum)
Significantly elevated Cr, likely due to chronic persistent bladder outlet obstruction (enlarged prostate and bladder component)  Plan: 1. Re-check BMET - (UPDATE - serum Cr 1.62 >> 2.26, with BUN up to 31, BUN:Cr 13.7) 2. 06/11/14 - [facility] - spoke with triage RN Hinton Dyer, discussed patient's case, urgent apt scheduled at [facility] for tomorrow Weds 1/6 at 9:00am with Dr. Matilde Sprang for evaluation, anticipate likely foley placement and repeat imaging for concern of persistent urinary retention with Acute Kidney Injury (although, pt continues to void well and seems less likely complete acute urinary retention). - Last OV and lab results faxed to [facility] [phone] - Called patient 06/11/14 to relay this information (advised to f/u with new apt, and given red flag sx if acute retention unable to void go directly to ER)

## 2014-06-11 NOTE — Assessment & Plan Note (Signed)
Consistent with acute cystitis, likely secondary to urinary rentention due to bladder outlet obstruction - No symptoms of acute pyelo. Last course Cipro with improvement - UA (suggestive of UTI, neg nitrite, large leuks, mod RBC, loaded WBC)  Plan: 1. Start Keflex 500mg  QID x 7 days 2. Ordered Urine Culture

## 2014-06-11 NOTE — Assessment & Plan Note (Signed)
Likely cause of UTI, see details

## 2014-06-11 NOTE — Assessment & Plan Note (Addendum)
Seems to be significantly improved s/p prior foley cath trial, and now off foley with improved flow on Rapaflo per Urology - Significantly improved on f/u Renal US (from prior CT Urogram), however still concern with urinary retention without foley (per Uro pt refused cont cath), Urology planning to do urodynamics, cystoscopy - Likely currently UTI secondary to urinary obstruction and chronic urinary retention  Plan: 1. Continue Rapaflo per Urology 2. F/u with [facility] 06/14/14 - (pt wishes to switch to [facility], but will f/u on 1/8 and consider transfer records after that appointment) 3. Advised will need continued Urology follow-up 4. Re-check BMET - f/u Cr (UPDATE - results with elevated serum Cr 1.62 to 2.26) 5. 06/11/14 - [facility] - spoke with triage RN Hinton Dyer, discussed patient's case, urgent apt scheduled at [facility] for tomorrow Weds 1/6 at 9:00am with Dr. Matilde Sprang for evaluation, anticipate likely foley placement and repeat imaging for concern of persistent urinary retention with Acute Kidney Injury (although, pt continues to void well and seems less likely complete acute urinary retention). - Last OV and lab results faxed to [facility] [phone] - Called patient 06/11/14 to relay this information (advised to f/u with new apt, and given red flag sx if acute retention unable to void go directly to ER)

## 2014-06-12 LAB — URINE CULTURE: Colony Count: 6000

## 2014-06-27 ENCOUNTER — Encounter: Payer: Self-pay | Admitting: Family Medicine

## 2014-06-27 LAB — CREATININE, SERUM: Creatinine, Ser: 1.1 mg/dL

## 2014-07-11 ENCOUNTER — Encounter: Payer: Self-pay | Admitting: Family Medicine

## 2014-07-11 ENCOUNTER — Ambulatory Visit (INDEPENDENT_AMBULATORY_CARE_PROVIDER_SITE_OTHER): Payer: Medicare HMO | Admitting: Family Medicine

## 2014-07-11 VITALS — BP 160/74 | HR 90 | Temp 98.0°F | Ht 71.0 in | Wt 169.0 lb

## 2014-07-11 DIAGNOSIS — N401 Enlarged prostate with lower urinary tract symptoms: Secondary | ICD-10-CM | POA: Insufficient documentation

## 2014-07-11 DIAGNOSIS — N138 Other obstructive and reflux uropathy: Secondary | ICD-10-CM

## 2014-07-11 DIAGNOSIS — R634 Abnormal weight loss: Secondary | ICD-10-CM

## 2014-07-11 DIAGNOSIS — N139 Obstructive and reflux uropathy, unspecified: Secondary | ICD-10-CM

## 2014-07-11 DIAGNOSIS — N179 Acute kidney failure, unspecified: Secondary | ICD-10-CM

## 2014-07-11 NOTE — Assessment & Plan Note (Addendum)
Enlarged prostate, likely BPH but concern for high likelihood of prostate CA given h/o asymmetrical prostate exam, elevated PSA >6.3, concern with recent weight loss - Followed by [facility]  Plan: 1. Continue Flomax 0.8mg  daily, Finasteride 5mg  daily for BPH - per Urology 2. Reviewed prior discussion with pt from Urology, understands high likelihood of prostate cancer, considering treatment in future medical vs surgical, however patient seems to not be ready to discuss treatment further and would rather seek a second opinion (specifically from Dr. [facility] in [place])

## 2014-07-11 NOTE — Patient Instructions (Addendum)
Dear Juliette Mangle, Thank you for coming in to clinic today. It was good to see you!  1. It sounds like you are doing much better with the Foley Catheter and on the prostate medicine. 2. Follow-up with [facility] on 07/15/14 for catheter change. 3. Continue improved diet with regular meals, ensure/glucerna, and supplements.  Please schedule a follow-up appointment with Dr. Parks Ranger in 1-3 months for follow-up.  If you have any other questions or concerns, please feel free to call the clinic to contact me. You may also schedule an earlier appointment if necessary.  However, if your symptoms get significantly worse, please go to the Emergency Department to seek immediate medical attention.  Nobie Putnam, [place]

## 2014-07-11 NOTE — Assessment & Plan Note (Signed)
Significant improvement s/p indwelling foley catheter per Urology. Secondary to worsening bladder outlet obstruction from BPH - Complicated by AKI and b/l hydronephrosis - Last serum creatinine from Urology from 2.26 down to 1.1 (06/24/14)  Plan: 1. Continue with indwelling foley catheter 2. Continue Flomax 0.8mg  daily, Finasteride 5mg  daily for BPH 3. F/u with Urology on 07/15/14 for foley change 4. Continue discussions with Urology to consider potential unobstructing surgery >> bladder outlet surgery vs TURP

## 2014-07-11 NOTE — Assessment & Plan Note (Signed)
Resolved, s/p indwelling foley catheter per Urology. AKI due to worsening bladder outlet obstruction from BPH - Last serum creatinine from Urology from 2.26 down to 1.1 (06/24/14)  Plan: 1. Continue with indwelling foley catheter 2. F/u with Urology on 07/15/14 for foley change

## 2014-07-11 NOTE — Assessment & Plan Note (Signed)
Improved. Recent relatively acute problem over past 1-2 months with significant unintentional wt loss (about 30 lbs in 1.5 months) due to reduced appetite and limited PO, attributed to "food taste", additionally with symptoms of weakness, fatigue, and poor sleep. Concern may be related to highly likely prostate cancer vs general poor PO with acute illness of acute renal failure. - Currently wt up +13 lbs to 169 in 1 month  Plan: 1. Continue improved regular diet, including ensure/glucerna shakes 1-2x daily 2. Continue f/u Urology for management of enlarged prostate / possible prostate CA

## 2014-07-11 NOTE — Progress Notes (Signed)
   Subjective:    Patient ID: Richard Moses, male    DOB: [birth year], 81 y.o.   MRN: [id number]  HPI  RECENT URINARY OBSTRUCTION (secondary to BPH) / AKI: - Last seen at [facility] for same complaint 06/10/14, labs showed elevated serum Cr 1.62 to 2.26 thought to be d/t urinary outflow obstruction, arranged urgent f/u within 24 hours back at [facility]. Additionally, treated for presumed UTI with Keflex (urine culture insignificant growth, symptoms improved) - Last seen at [facility] on 06/24/14 (Dr. Junious Silk) for follow-up, dx urinary retention due to BPH (complicated by acute renal failure with bilateral hydronephrosis), treated with foley catheter (indwelling), increased Flomax to 0.8mg  daily, started Finasteride. Overall improved with Creatinine down to 1.1, discussed chronic vs intermittent cath and prostate surgeries. Suspected high likelihood of prostate CA, to be addressed at future visits once kidney function improved. - Today patient reports overall doing "much better", has foley catheter in place today with leg bag in place. Scheduled for next foley change on 07/15/14 (prior to trip to [place] in March 2016), understands he will need monthly cath changes, but hopeful it can be removed eventually. - Regarding his prostate, he has checked it himself and "thinks it is a little smaller", continues on medicines as prescribed - Currently not interested in pursuing any surgical therapy for prostate. States that he would travel to [place] to see [facility] before doing any surgery" - Admits passing regular volume urine - Denies dysuria or fevers, hematuria or clots, abdominal or flank pain  WEIGHT LOSS: - Currently wt gain +13 lbs up to 169, overall remains 10-12 lbs down from 04/2014 - Reports "feeling good" today, admits to improved appetite as previously had poor appetite about 1-2 month ago. - Resumed taking daily MVI and supplements. Takes Ensure  protein shake 1-2x daily, admits regular 3 meals daily  I have reviewed and updated the following as appropriate: allergies and current medications  Social Hx: - Never smoker  Review of Systems  See above HPI    Objective:   Physical Exam  BP 160/74 mmHg  Pulse 90  Temp(Src) 98 F (36.7 C) (Oral)  Ht 5\' 11"  (1.803 m)  Wt 169 lb (76.658 kg)  BMI 23.58 kg/m2  Gen - well-appearing, improved weight gain, comfortable, NAD HEENT - oropharynx clear, MMM Heart - RRR, no murmurs heard Abd - soft, non-tender or distended (improved previously mildly distended bladder), active +BS Skin - warm, dry GU - Foley catheter in place, urine in bag appears yellow and mostly clear Neuro - awake, alert, oriented     Assessment & Plan:   See specific A&P problem list for details.

## 2014-09-03 ENCOUNTER — Encounter: Payer: Self-pay | Admitting: Family Medicine

## 2014-09-03 ENCOUNTER — Ambulatory Visit (INDEPENDENT_AMBULATORY_CARE_PROVIDER_SITE_OTHER): Payer: Medicare HMO | Admitting: Family Medicine

## 2014-09-03 VITALS — BP 146/72 | HR 102 | Temp 98.4°F | Wt 167.0 lb

## 2014-09-03 DIAGNOSIS — B029 Zoster without complications: Secondary | ICD-10-CM

## 2014-09-03 MED ORDER — VALACYCLOVIR HCL 1 G PO TABS: 1000.0000 mg | ORAL_TABLET | Freq: Three times a day (TID) | ORAL | Status: DC

## 2014-09-03 NOTE — Patient Instructions (Signed)
It was great seeing you today.   1. Valtrex 1 pill - three times a day for 7 days   If you have any questions or concerns before then, please call the clinic at [phone].  Take Care,   Dr Phill Myron

## 2014-09-03 NOTE — Progress Notes (Signed)
   Subjective:    Patient ID: Richard Moses, male    DOB: May 22, 1933, 81 y.o.   MRN: [id number]  Seen for Same day visit for   CC: rash  He reports itchy, burning rash on right upper back to right chest x 1 week. He denies any contact with poison ivy. Can't recall having chicken pox before. Denies fevers, chills, confusion. Denies having HIV or CA.   Review of Systems   See HPI for ROS. Objective:  BP 146/72 mmHg  Pulse 102  Temp(Src) 98.4 F (36.9 C) (Oral)  Wt 167 lb (75.751 kg)  General: NAD Cardiac: RRR, normal heart sounds, no murmurs. 2+ radial and PT pulses bilaterally Respiratory: CTAB, normal effort Skin: Vesicular rash on right upper back stretching down posterior right upper arm and right chest.     Assessment & Plan:  See Problem List Documentation

## 2014-09-19 ENCOUNTER — Ambulatory Visit (INDEPENDENT_AMBULATORY_CARE_PROVIDER_SITE_OTHER): Payer: Medicare HMO | Admitting: Family Medicine

## 2014-09-19 ENCOUNTER — Encounter: Payer: Self-pay | Admitting: Family Medicine

## 2014-09-19 VITALS — BP 140/70 | HR 84 | Temp 98.8°F | Ht 71.0 in | Wt 168.0 lb

## 2014-09-19 DIAGNOSIS — Z8619 Personal history of other infectious and parasitic diseases: Secondary | ICD-10-CM | POA: Diagnosis not present

## 2014-09-19 DIAGNOSIS — M79671 Pain in right foot: Secondary | ICD-10-CM

## 2014-09-19 DIAGNOSIS — B0229 Other postherpetic nervous system involvement: Secondary | ICD-10-CM | POA: Diagnosis not present

## 2014-09-19 MED ORDER — GABAPENTIN 100 MG PO CAPS: 100.0000 mg | ORAL_CAPSULE | Freq: Every day | ORAL | Status: DC

## 2014-09-19 NOTE — Assessment & Plan Note (Addendum)
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 Chronic R-toe / foot pain >1 year following traumatic injury. Possible metatarsalgia - Previously worked up with R-foot X-ray 04/2014 (negative with mild OA 1st MTP)  Plan: 1. Referral to Orthopedics for further evaluation of possible R-3rd hammertoe 2. Continue conservative therapy, encouraged trial OTC orthotics to see if relief 3. Consider future referral to Sports Medicine for evaluation and possible custom orthotics vs insole pads (maybe component of metatarsalgia)  if no improvement

## 2014-09-19 NOTE — Progress Notes (Signed)
   Subjective:    Patient ID: FURIOUS WRITER, male    DOB: 1933-02-14, 81 y.o.   MRN: [id number]  HPI  SHINGLES FOLLOW-UP: - Last seen [facility] 3/29, dx Shingles, given Valtrex 1000mg  TID x 7 days - Today reports that symptoms overall continue to gradually improve over past 5 days, still having some residual itching as rash is healing. Describes some "discomfort" with movement of Right arm or deep breath on Right side, worse at night when lays on back with different "sharp pain". Stated that pain was worse at night when trying to sleep - Taking Tylenol PM for some relief - Denies any fevers/chills, worsening / spreading or new rash, CP, SOB, HA, swelling  RIGHT, MIDDLE TOE PAIN / HAMMERTOE: - Last seen for same complaint 04/2014 - Reports chronic hx >1 year of right front foot pain localized to 3rd toe, suspected inciting injury 1 year ago when he "stubbed toe" on a table leg with significant pain at that time, never treated and never had X-rays. - Today repeats same complaints as last visit without improvement. Continues to have aching on some days worse with ambulation and weight bearing, otherwise other days / weeks he does well. Due to deformity with toe he now feels like he has "rubbing" on top of toe while foot is in shoe. - Denies any swelling, redness, numbness, weakness, or tingling  I have reviewed and updated the following as appropriate: allergies and current medications  Social Hx: - Never smoker  Review of Systems  See above HPI    Objective:   Physical Exam  BP 140/70 mmHg  Pulse 84  Temp(Src) 98.8 F (37.1 C) (Oral)  Ht 5\' 11"  (1.803 m)  Wt 168 lb (76.204 kg)  BMI 23.44 kg/m2  Gen - well-appearing, comfortable, NAD HEENT - MMM Heart - RRR, no murmurs heard MSK - Right 3rd toe with hammertoe deformity appears curved down with exaggerated flexion at rest, muscle strength intact, no significant metatarsal pain on palpation. Skin - Mostly healed mildly erythematous  patchy rash consistent with prior VZV shingles following right sided only dermatomal pattern mid T-spine across back and ribcage and down posterior R-arm Neuro - awake, alert, oriented. Strength grossly intact 5/5 bilateral grip, distal sensation to touch intact.     Assessment & Plan:   See specific A&P problem list for details.

## 2014-09-19 NOTE — Patient Instructions (Signed)
Dear Richard Moses, Thank you for coming in to clinic today. It was good to see you!  1. For your Shingles - it looks like it is healing. Start taking Gabapentin at night for pain relief, you can continue Tylenol PM - for Gabapentin - take 1 capsule (100mg ) at night before bed for 1 week, then you may increase to 2 capsules for 1 week, then up to 3 capsules (300mg ) until next follow-up, you may need this medicine for weeks or months, possibly long-term 2. Referral placed to orthopedics for evaluation of your Toe  Please schedule a follow-up appointment with Dr. Parks Ranger in 1-3 months for next Diabetes Follow-up  If you have any other questions or concerns, please feel free to call the clinic to contact me. You may also schedule an earlier appointment if necessary.  However, if your symptoms get significantly worse, please go to the Emergency Department to seek immediate medical attention.  Nobie Putnam, DO [facility]   Postherpetic Neuralgia Postherpetic neuralgia (PHN) is nerve pain that occurs after a shingles infection. Shingles is a painful rash that appears on one side of the body, usually on your trunk or face. Shingles is caused by the varicella-zoster virus. This is the same virus that causes chickenpox. In people who have had chickenpox, the virus can resurface years later and cause shingles. You may have PHN if you continue to have pain for 3 months after your shingles rash has gone away. PHN appears in the same area where you had the shingles rash. For most people, PHN goes away within 1 year.  Getting a vaccination for shingles can prevent PHN. This vaccine is recommended for people older than 50. It may prevent shingles and may also lower your risk of PHN if you do get shingles. CAUSES PHN is caused by damage to your nerves from the varicella-zoster virus. This damage makes your nerves overly sensitive.  RISK FACTORS Aging is the biggest risk factor for  developing PHN. Most people who get PHN are older than 63. Other risk factors include:  Having very bad pain before your shingles rash starts.  Having a very bad rash.  Having shingles in the nerve that supplies your face and eye (trigeminal nerve). SIGNS AND SYMPTOMS Pain is the main symptom of PHN. The pain is often very bad and may be described as stabbing, burning, or feeling like an electric shock. The pain may come and go or may be there all the time. Pain may be triggered by light touches on the skin or changes in temperature. You may have itching along with the pain. DIAGNOSIS  Your health care provider may diagnose PHN based on your symptoms and your history of shingles. Lab studies and other diagnostic tests are usually not needed. TREATMENT  There is no cure for PHN. Treatment for PHN will focus on pain relief. Over-the-counter pain relievers do not usually relieve PHN pain. You may need to work with a pain specialist. Treatment may include:  Antidepressant medicines to help with pain and improve sleep.  Antiseizure medicines to relieve nerve pain.  Strong pain relievers (opioids).  A numbing patch worn on the skin (lidocaine patch). HOME CARE INSTRUCTIONS It may take a long time to recover from PHN. Work closely with your health care provider, and have a good support system at home.   Take all medicines as directed by your health care provider.  Wear loose, comfortable clothing.  Cover sensitive areas with a dressing to reduce  friction from clothing rubbing on the area.  If cold does not make your pain worse, try applying a cool compress or cooling gel pack to the area.  Talk to your health care provider if you feel depressed or desperate. Living with long-term pain can be depressing. SEEK MEDICAL CARE IF:  Your medicine is not helping.  You are struggling to manage your pain at home. Document Released: 08/14/2002 Document Revised: 10/08/2013 Document Reviewed:  05/15/2013 [facility] Patient Information 2015 [place], [place]. This information is not intended to replace advice given to you by your health care provider. Make sure you discuss any questions you have with your health care provider.

## 2014-09-19 NOTE — Assessment & Plan Note (Signed)
Resolving VZV shingles (s/p Valtrex x 7 days), 1st episode. Now with still healing rash (significantly improved) and post-herpetic neuralgia - Did not receive Zostavax  Plan: 1. Start Gabapentin 100mg  qhs with instructions to titrate weekly up to 300mg  qhs for post-herpetic neuralgia and help with sleep, cautioned on sedation. Discussed with preceptor Dr. Andria Frames, decision to select Gabapentin as best agent, patient would not be candidate for amitriptyline given age 37 with cardiac side-effects. 2. Recommend may need to take Gabapentin for weeks to months, can refill at 90 day supply if continues to take long-term

## 2014-12-06 ENCOUNTER — Ambulatory Visit (INDEPENDENT_AMBULATORY_CARE_PROVIDER_SITE_OTHER): Payer: Medicare HMO | Admitting: Family Medicine

## 2014-12-06 ENCOUNTER — Encounter: Payer: Self-pay | Admitting: Family Medicine

## 2014-12-06 VITALS — BP 138/64 | HR 103 | Temp 98.1°F | Ht 71.0 in | Wt 160.9 lb

## 2014-12-06 DIAGNOSIS — N139 Obstructive and reflux uropathy, unspecified: Secondary | ICD-10-CM | POA: Diagnosis not present

## 2014-12-06 DIAGNOSIS — R5382 Chronic fatigue, unspecified: Secondary | ICD-10-CM

## 2014-12-06 DIAGNOSIS — R5383 Other fatigue: Secondary | ICD-10-CM | POA: Insufficient documentation

## 2014-12-06 DIAGNOSIS — E119 Type 2 diabetes mellitus without complications: Secondary | ICD-10-CM | POA: Diagnosis not present

## 2014-12-06 DIAGNOSIS — R531 Weakness: Secondary | ICD-10-CM | POA: Insufficient documentation

## 2014-12-06 LAB — CBC
HCT: 29 % — ABNORMAL LOW (ref 39.0–52.0)
Hemoglobin: 9.7 g/dL — ABNORMAL LOW (ref 13.0–17.0)
MCH: 32.8 pg (ref 26.0–34.0)
MCHC: 33.4 g/dL (ref 30.0–36.0)
MCV: 98 fL (ref 78.0–100.0)
MPV: 10.2 fL (ref 8.6–12.4)
Platelets: 214 10*3/uL (ref 150–400)
RBC: 2.96 MIL/uL — ABNORMAL LOW (ref 4.22–5.81)
RDW: 14.1 % (ref 11.5–15.5)
WBC: 5.6 10*3/uL (ref 4.0–10.5)

## 2014-12-06 LAB — BASIC METABOLIC PANEL WITH GFR
BUN: 30 mg/dL — ABNORMAL HIGH (ref 6–23)
CHLORIDE: 103 meq/L (ref 96–112)
CO2: 22 mEq/L (ref 19–32)
CREATININE: 1.76 mg/dL — AB (ref 0.50–1.35)
Calcium: 8.6 mg/dL (ref 8.4–10.5)
GFR, EST AFRICAN AMERICAN: 41 mL/min — AB
GFR, EST NON AFRICAN AMERICAN: 36 mL/min — AB
Glucose, Bld: 227 mg/dL — ABNORMAL HIGH (ref 70–99)
Potassium: 4.5 mEq/L (ref 3.5–5.3)
Sodium: 139 mEq/L (ref 135–145)

## 2014-12-06 LAB — LDL CHOLESTEROL, DIRECT: LDL DIRECT: 95 mg/dL

## 2014-12-06 LAB — POCT GLYCOSYLATED HEMOGLOBIN (HGB A1C): Hemoglobin A1C: 7.3

## 2014-12-06 NOTE — Patient Instructions (Addendum)
Dear Richard Moses, Thank you for coming in to clinic today. It was good to see you!  1. Your Diabetes is stable and blood sugars controlled. A1c 7.3 keep up the good work (last time 7.0). Eat healthy carb reduced diet. Plenty of water, avoid sugary drinks. 2. For your Fatigue - we will check blood work today, mail results to you. Keep eating regular diet, continue meal supplement 1 to 2 times daily - Discontinue Hydroxyzine this can worsen your fatigue 3. Keep following with Dr. Amalia Ha[facility]  Some important numbers from today's visit: HgA1C - 7.3 BP - BP 138/64 mmHg  Pulse 103  Temp(Src) 98.1 F (36.7 C) (Oral)  Ht 5\' 11"  (1.803 m)  Wt 160 lb 14.4 oz (72.984 kg)  BMI 22.45 kg/m2 Results -  Please schedule a follow-up appointment with Dr. Parks Ranger in 1-3 months for Annual Physical  If you have any other questions or concerns, please feel free to call the clinic to contact me. You may also schedule an earlier appointment if necessary.  However, if your symptoms get significantly worse, please go to the Emergency Department to seek immediate medical attention.  Richard Moses, [place]

## 2014-12-06 NOTE — Assessment & Plan Note (Addendum)
Remains well diet-controlled overall. Slight inc A1c (within goal range 7-8) - No medications. Current HgbA1c 7.3 (7/1), last HgbA1c 7.0 (04/2014). No complications or hypoglycemia.  Plan:  1. Continue diet / lifestyle control - continue to improve regular exercise 2. No medical therapy. 3. Previously declined ACE/ARB. 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 4. Check BMET, direct LDL 5. RTC 6 mo for DM f/u

## 2014-12-06 NOTE — Progress Notes (Signed)
   Subjective:    Patient ID: Richard Moses, male    DOB: Jun 26, 1932, 81 y.o.   MRN: [id number]  HPI  CHRONIC DM, Type 2: Reports no concerns - brought consumer's report regarding blood sugar control. CBGs: Has not checked CBG in about 2 weeks - out of test strips. From prior recall few months ago, Avg 120-150, Hi Not >200 Checks CBGs - once every other week Meds: supplements only Reports good compliance. Tolerating well w/o side-effects Not on ACEi/ARB Lifestyle: Diet (avoids sugars, no sodas and teas) / Exercise (not regular, plan to get back to [facility], some stationary bike riding helps knees) Previously followed by Ophthalmology, has not been in a while, request referral Denies hypoglycemia, polyuria, visual changes, numbness or tingling.  FATIGUE: - Reports symptoms for about past 2 months. Seems to be worse recently after starting Hydroxyzine. Some days feels really good, other days feels exhausted. Thinks that symptoms worse in summer with heat, similar to previous. - Admits to drinking plenty of water. Appetite seems to fluctuate, trying ensure supplement 1x daily - Takes extensive multivitamins and natural products - Denies any fevers/chills, nightsweats, bloody stools  BPH with urinary obstruction: - Previously followed by [facility] (Dr. Junious Moses) for past 6 months. Recently switched to [facility] with Dr. Alona Moses in 11/2014, due to patient preference. He declined surgical options previously. - Continues with obstruction requiring clean intermittent catheterization (CIC), does at home up to TID, however can void small amounts spontaneously throughout the day. Occasionally urinary itching associated with foley, previously on hydroxyzine and lidocaine gel for this. - Continues on Flomax 0.4mg  daily, Finasteride 5mg  daily. Off macrobid (took for UTI x 10 days) - Next f/u apt with Dr. Amalia Hailey in 2 months - Denies any dysuria, hematuria, fevers/chills, abd / flank  pain  I have reviewed and updated the following as appropriate: allergies and current medications  Social Hx: - Never smoker  Review of Systems  See above HPI    Objective:   Physical Exam  BP 138/64 mmHg  Pulse 103  Temp(Src) 98.1 F (36.7 C) (Oral)  Ht 5\' 11"  (1.803 m)  Wt 160 lb 14.4 oz (72.984 kg)  BMI 22.45 kg/m2  Gen - well-appearing elderly M, pleasant and comfortable, NAD HEENT - oropharynx clear, MMM Neck - supple, non-tender, no LAD, no thyromegaly Heart - RRR, no murmurs heard Lungs - CTAB, no wheezing, crackles, or rhonchi. Normal work of breathing. Abd - soft, NTND, no palpable distended bladder, no masses, +active BS Ext - non-tender, no edema, peripheral pulses intact +2 b/l Skin - warm, dry, no rashes     Assessment & Plan:   See specific A&P problem list for details.

## 2014-12-06 NOTE — Assessment & Plan Note (Addendum)
Stable and significantly improved from previous. - Continues to tolerate CIC, no evidence of UTI  Plan: 1. Continue f/u with [facility] (Dr. Amalia Hailey) after transferring care - request records 2. Continue CIC, can use lidogel if improved discomfort 3. Discontinue Hydroxyzine for itching - concern sedation 4. Continue Flomax 0.4mg  daily and Finasteride 5mg  daily for BPH 5. Check BMET

## 2014-12-06 NOTE — Assessment & Plan Note (Signed)
Intermittent fatigue, previous history of unintentional wt loss, recently 6 lb in 3 months. Fluctuating appetite. May be due to possible prostate CA. - No associated symptoms, afebrile, no chills/nightsweats. - Consider broad differential, include hypothyroidism, anemia, poor PO intake, general deconditioning, malignancy  Plan: 1. Continue improved regular diet. Increase ensure protein shake to BID 2. Continue f/u Urology - previously had advised that patient is high risk for prostate CA with elevated PSA and worsening obstruction, however declines any surgical therapy. 3. Check TSH, BMET, CBC - mail results and f/u 4. RTC 1-3 months

## 2014-12-07 LAB — TSH: TSH: 3.031 u[IU]/mL (ref 0.350–4.500)

## 2014-12-10 ENCOUNTER — Other Ambulatory Visit: Payer: Self-pay | Admitting: Family Medicine

## 2014-12-10 ENCOUNTER — Encounter: Payer: Self-pay | Admitting: Family Medicine

## 2014-12-10 DIAGNOSIS — E119 Type 2 diabetes mellitus without complications: Secondary | ICD-10-CM

## 2014-12-10 MED ORDER — GLUCOSE BLOOD VI STRP: ORAL_STRIP | Status: DC

## 2014-12-17 ENCOUNTER — Other Ambulatory Visit: Payer: Self-pay | Admitting: Family Medicine

## 2014-12-17 ENCOUNTER — Telehealth: Payer: Self-pay | Admitting: Family Medicine

## 2014-12-17 DIAGNOSIS — E119 Type 2 diabetes mellitus without complications: Secondary | ICD-10-CM

## 2014-12-17 MED ORDER — SURECHEK CONTROL SOLUTION HIGH VI LIQD
Status: DC
Start: 2014-12-17 — End: 2016-04-02

## 2014-12-17 MED ORDER — SURECHEK CONTROL SOLUTION LOW VI LIQD: Status: DC

## 2014-12-17 NOTE — Telephone Encounter (Signed)
Patient request prescription for control solution bar to be send to [place] at [facility]. Please, follow up with Patient.

## 2014-12-17 NOTE — Progress Notes (Signed)
Sent e-script DM testing supply Control Solution (sent SureCheck Low and Hi) to pharmacy, requested to Please dispense brand / meter appropriate control test solution. Call if problems or need to switch to alternate order [phone].  Nobie Putnam, [place], PGY-3

## 2014-12-17 NOTE — Telephone Encounter (Signed)
Filled

## 2014-12-18 ENCOUNTER — Telehealth: Payer: Self-pay | Admitting: *Deleted

## 2014-12-18 NOTE — Telephone Encounter (Signed)
Pt in nurse clinic stating that [facility] did not receive Rx for test control solutions.  Per [facility] they don't carry any test control solutions. [facility] was called; patient is able to pick up the solutions with a prescription.  Derl Barrow, RN

## 2014-12-25 ENCOUNTER — Other Ambulatory Visit: Payer: Self-pay | Admitting: Family Medicine

## 2014-12-25 DIAGNOSIS — B0229 Other postherpetic nervous system involvement: Secondary | ICD-10-CM

## 2015-03-24 ENCOUNTER — Telehealth: Payer: Self-pay | Admitting: Family Medicine

## 2015-03-24 NOTE — Telephone Encounter (Signed)
Patient dropped off papers for dr to see regarding referrals he needs at [place] papers in dr's box.

## 2015-03-25 NOTE — Telephone Encounter (Signed)
Reviewed documents from [facility], appointment reminders for upcoming Urology apts: 1. Renal US on 04/08/15 at 10:00am (at [place] location) 2. Urology follow-up with Dr. Alona Bene on 04/08/15 at 10:45am (same location)  Called patient, and he reports some confusion about needing a referral for this [place] and appointment, the papers state it may need to be authorized. I spoke with [facility] referral coordinator and confirmed he has [employer], and should not need repeat referral. I spoke with [facility] office, and they confirmed that his appointments are good, and his referral was already sent to his ins company, pending waiting final approval but he should attend the appointments. I relayed this information to him. No further questions.  Nobie Putnam, [place], PGY-3

## 2015-04-23 ENCOUNTER — Ambulatory Visit (INDEPENDENT_AMBULATORY_CARE_PROVIDER_SITE_OTHER): Payer: Medicare HMO | Admitting: Family Medicine

## 2015-04-23 ENCOUNTER — Encounter: Payer: Self-pay | Admitting: Family Medicine

## 2015-04-23 VITALS — BP 128/78 | HR 104 | Temp 98.0°F | Ht 71.0 in | Wt 158.3 lb

## 2015-04-23 DIAGNOSIS — R339 Retention of urine, unspecified: Secondary | ICD-10-CM

## 2015-04-23 DIAGNOSIS — N183 Chronic kidney disease, stage 3 unspecified: Secondary | ICD-10-CM

## 2015-04-23 DIAGNOSIS — B0229 Other postherpetic nervous system involvement: Secondary | ICD-10-CM

## 2015-04-23 DIAGNOSIS — Z0000 Encounter for general adult medical examination without abnormal findings: Secondary | ICD-10-CM

## 2015-04-23 DIAGNOSIS — I10 Essential (primary) hypertension: Secondary | ICD-10-CM

## 2015-04-23 DIAGNOSIS — N138 Other obstructive and reflux uropathy: Secondary | ICD-10-CM

## 2015-04-23 DIAGNOSIS — N401 Enlarged prostate with lower urinary tract symptoms: Secondary | ICD-10-CM

## 2015-04-23 DIAGNOSIS — Z23 Encounter for immunization: Secondary | ICD-10-CM | POA: Diagnosis not present

## 2015-04-23 DIAGNOSIS — N3001 Acute cystitis with hematuria: Secondary | ICD-10-CM

## 2015-04-23 DIAGNOSIS — E1122 Type 2 diabetes mellitus with diabetic chronic kidney disease: Secondary | ICD-10-CM | POA: Diagnosis not present

## 2015-04-23 NOTE — Assessment & Plan Note (Signed)
Remains well diet-controlled overall - No medications. Last A1c 7.6 in [id number] No complications or hypoglycemia.  Plan:  1. Continue diet / lifestyle control - continue to improve regular exercise 2. No medical therapy. 3. No ACE/ARB, previously due to declined meds. Check urine microalbumin today (no prior testing, had some trace to 30 protein on prior UAs, but prior UTIs frequently). Unable to give urine sample today, ordered future POCT Microalbumin, to schedule lab only apt soon. If elevated, will re-evaluate need for ACE/ARB, consider consult to Nephrology given complicated Urology problem with urinary retention. 4. Foot exam done today 5. Advised to call us with info or have ophtho records sent, for annual DM eye exam 6. Receive Flu shot today 7. RTC 3 mo for A1c

## 2015-04-23 NOTE — Progress Notes (Signed)
Subjective:    Patient ID: Richard Moses, male    DOB: Oct 28, 1932, 81 y.o.   MRN: [id number]  Richard Moses is a 81 y.o. male presenting on 04/23/2015 for Annual Exam  HPI  CHRONIC DM, Type 2: Reports no concerns, feels like DM is doing well since last visit 12/2014. History of weight fluctuations - currently 158 lb similar to last visit 12/2014 at 160 lb, home scales have run lower, like 153 lb today. CBGs: not reviewed today Meds: No DM medications. Diet controlled. Reports good compliance. Tolerating well w/o side-effects Not on ACE/ARB Lifestyle: Diet (reduced carbs / sugar, avoiding fried foods) / Exercise (limited walking occasionally, plans to return to [facility]) Last Ophthalmology - Dr Phineas Real (@ Dr Digby's office, last apt 11/18/14) - Requesting to take ASA 81mg  daily for occasional headaches, interested in taking this at bedtime. Denies hypoglycemia, polyuria, visual changes, numbness or tingling.  CHRONIC HTN: Reports no concerns Current Meds - none   Denies CP, dyspnea, HA, edema, dizziness / lightheadedness  URINARY RETENTION, with BPH urinary obstruction: - Followed by Dr. Alona Bene ([facility]) (transferred from [facility]), last apt 04/08/2015. Had Renal US showing "moderate to severe Left hydronephrosis, bladder wall thickening and trabeculation with chronic outlet obstruction, likely from enlarged prostate", labs showed PSA up from 5.1 to 6.89, elevated SCr to 1.96 - Continues to do CIC nightly and now once during day PRN, otherwise he does void some during the day spontaneously. Occasionally urinary itching associated with foley, previously on lidocaine gel, some relief - Continues on Flomax 0.4mg  daily, Finasteride 5mg  daily - Admits to cloudy urine, was treated with Bactrim-DS x 7 days (per Dr Mancel Bale for itching with foley) which improved, now returning after few days - Denies any dysuria, hematuria, fevers/chills, abd / flank pain  CKD, Stage-III - In  setting of known chronic well controlled Diabetes. Also with underlying BPH and urinary obstruction with prior AKI and bilateral chronic hydronephrosis. - Now Left hydro on renal US. Elevated SCr - Not on ACE/ ARB, not on NSAIDs, except topical Voltaren on bilateral knees, advised to use with caution - Voiding well, but still uses CIC pm and PRN daytime  ONYCHOMYCOSIS, bilateral great toes - Using topical OTC anti-fungal medicine for several months now intermittent improvement - Not interested in taking any PO medications for this - Takes good care of feet, daily cleaning, no ulcerations or erythema  Health Maintenance: - Due for TDap and influenza vaccine today (agrees to both) - Last Colonoscopy 2007 with 3 polyps, has not returned, and he is not interested in future colonoscopy or interventions at this time. Admits to some prior history of dark stools due to taking Berberine supplement - Lives at home alone. His male partner passed away about 3 years ago 02-08-12), and most of his family lives in [place]. He occasionally experiences some sadness around holidays. PHQ9: score 1, not very difficult.  Past Medical History  Diagnosis Date  . Diabetes mellitus   . Hypertension   . Hyperlipidemia   . Neck pain on left side 2012  . Chronic left-sided headaches   . MRI of brain abnormal 09/29/10    Abnormal MRI of brain, demonstrating mild atrophy  . 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 Abnormal MRI, cervical spine 09/29/10    Mildly abnormal MRI cervical spine demonstrating mild spondylosis and disc bulging from C4-5 down to C7-T1.  No spinal stenosis or foraminal narrowing  . Hypothyroidism  09/29/10    Untreated. Patient not interested in Rx.   . Colon polyp 2007  . External hemorrhoids without mention of complication [id number]   Social History   Social History  . Marital Status: Single    Spouse Name: N/A  . Number of Children: 0  . Years of Education: N/A    Occupational History  .     Social History Main Topics  . Smoking status: Never Smoker   . Smokeless tobacco: Never Used  . Alcohol Use: No     Comment: occasionally  . Drug Use: No  . Sexual Activity: No   Other Topics Concern  . Not on file   Social History Narrative   Lives with boarder (young [place] man)   Works out 3x per week at [employer] lives in [place] where he is from originally    Family History  Problem Relation Age of Onset  . Cancer Father 88    Renal CA  . Cancer Sister     Brain   . Cancer Brother     Brain   Current Outpatient Prescriptions on File Prior to Visit  Medication Sig  . Alpha-Lipoic Acid 300 MG TABS Take 600 mg by mouth 3 (three) times daily.  . B Complex-C (B-COMPLEX WITH VITAMIN C) tablet Take 1 tablet by mouth daily.  Jolyne Loa Grape-Goldenseal (BERBERINE COMPLEX PO) Take 500 mg by mouth 3 (three) times daily with meals.  . Blood Glucose Calibration (SURECHEK CONTROL SOLUTION) HIGH LIQD Use to test glucometer  . Blood Glucose Calibration (SURECHEK CONTROL SOLUTION) LOW LIQD Use to test glucometer  . cholecalciferol (VITAMIN D) 400 UNITS TABS tablet Take 1,000 Units by mouth 3 (three) times daily with meals.  . Chromium Picolinate 200 MCG CAPS Take 66 mcg by mouth 3 (three) times daily with meals.  Marland Kitchen DHEA 25 MG CAPS Take 1 capsule by mouth daily.  . diclofenac sodium (VOLTAREN) 1 % GEL Apply 2 g topically 4 (four) times daily.  . finasteride (PROSCAR) 5 MG tablet   . Flaxseed, Linseed, (FLAXSEED OIL) 1000 MG CAPS Take 1 capsule by mouth daily.  Marland Kitchen gabapentin (NEURONTIN) 100 MG capsule TAKE 1 CAPSULE(100 MG) BY MOUTH AT BEDTIME  . glucose blood test strip Check blood sugar once daily as instructed.  . Grape Seed Extract 100 MG CAPS Take 150 mg by mouth daily.  . Iodine, Kelp, 0.15 MG TABS Take 1 tablet by mouth daily.  Marland Kitchen lidocaine (XYLOCAINE) 2 % jelly APPLY A THIN LAYER TO CATHETER BEFORE EACH INSERTION  . Lutein  20 MG TABS Take 1 tablet by mouth daily.  . Mag Oxide-Vit D3-Turmeric (MAGNESIUM-VITAMIN D3-TURMERIC PO) Take 2 tablets by mouth daily.  . Melatonin 3 MG TABS Take 1 tablet by mouth at bedtime as needed.  . Methylcobalamin (B-12) 5000 MCG TBDP Take 1 tablet by mouth daily.  . Misc Natural Products (OSTEO BI-FLEX ADV TRIPLE ST PO) Take 1 tablet by mouth 2 (two) times daily.  . Misc Natural Products (PROSTATE SUPPORT PO) Take 1 capsule by mouth daily.  . Multiple Vitamin (MULTIVITAMIN) tablet Take 1 tablet by mouth daily.  . Multiple Vitamins-Minerals (MENS MULTIVITAMIN PLUS) TABS Take 1 tablet by mouth daily after breakfast.  . nitroGLYCERIN (NITRO-DUR) 0.1 mg/hr patch Cut patch into quarters and apply one quarter patch toaffected area. Remove and repeat daily  . saw palmetto 80 MG capsule Take 80 mg by mouth daily.  . tamsulosin (FLOMAX) 0.4 MG CAPS capsule   .  vitamin A 10000 UNIT capsule Take 40,000 Units by mouth daily.  . Vitamin D, Ergocalciferol, (DRISDOL) 50000 UNITS CAPS capsule Take 50,000 Units by mouth daily.  Marland Kitchen VITAMIN D-VITAMIN K PO Take 2 tablets by mouth daily.  . Zeaxanthin POWD Take 4 mg by mouth daily.   No current facility-administered medications on file prior to visit.    Review of Systems  Constitutional: Negative for fever, chills, diaphoresis, activity change, appetite change, fatigue and unexpected weight change.  HENT: Positive for hearing loss (chronic, bilateral hearing aids).   Respiratory: Negative for cough and shortness of breath.   Cardiovascular: Negative for leg swelling.  Gastrointestinal: Positive for constipation (occasional, resolves with coffee usually). Negative for nausea, vomiting, abdominal pain, diarrhea, blood in stool and anal bleeding.  Genitourinary: Negative for dysuria, urgency, frequency, hematuria, decreased urine volume and difficulty urinating.  Musculoskeletal: Negative for arthralgias.  Skin: Negative for rash.  Neurological:  Negative for dizziness and weakness. Headaches: occasional, PM, resolves with baby aspirin, not daily, no complicating symptoms.   Per HPI unless specifically indicated above     Objective:    BP 128/78 mmHg  Pulse 104  Temp(Src) 98 F (36.7 C) (Oral)  Ht 5\' 11"  (1.803 m)  Wt 158 lb 4.8 oz (71.804 kg)  BMI 22.09 kg/m2  Wt Readings from Last 3 Encounters:  04/23/15 158 lb 4.8 oz (71.804 kg)  12/06/14 160 lb 14.4 oz (72.984 kg)  09/19/14 168 lb (76.204 kg)    Physical Exam  Constitutional: He is oriented to person, place, and time. He appears well-developed and well-nourished. No distress.  Pleasant, elderly male  HENT:  Head: Normocephalic and atraumatic.  Mouth/Throat: Oropharynx is clear and moist.  Eyes: Conjunctivae and EOM are normal. Pupils are equal, round, and reactive to light.  Chronic R-sided ptosis  Neck: Normal range of motion. Neck supple.  Cardiovascular: Normal rate, regular rhythm, normal heart sounds and intact distal pulses.   No murmur heard. Pulmonary/Chest: Effort normal and breath sounds normal. No respiratory distress. He has no wheezes. He has no rales.  Abdominal: Soft. Bowel sounds are normal. He exhibits no distension. There is no tenderness.  Musculoskeletal: Normal range of motion. He exhibits no edema.  Neurological: He is alert and oriented to person, place, and time.  Skin: Skin is warm and dry. No rash (resolved HZV rash on chest wall, with residual itching but non-tender) noted. He is not diaphoretic.  Psychiatric: He has a normal mood and affect. His behavior is normal.  Nursing note and vitals reviewed.  Results for orders placed or performed in visit on [id number]  BASIC METABOLIC PANEL WITH GFR  Result Value Ref Range   Sodium 139 135 - 145 mEq/L   Potassium 4.5 3.5 - 5.3 mEq/L   Chloride 103 96 - 112 mEq/L   CO2 22 19 - 32 mEq/L   Glucose, Bld 227 (H) 70 - 99 mg/dL   BUN 30 (H) 6 - 23 mg/dL   Creat 1.76 (H) 0.50 - 1.35 mg/dL    Calcium 8.6 8.4 - 10.5 mg/dL   GFR, Est African American 41 (L) mL/min   GFR, Est Non African American 36 (L) mL/min  TSH  Result Value Ref Range   TSH 3.031 0.350 - 4.500 uIU/mL  CBC  Result Value Ref Range   WBC 5.6 4.0 - 10.5 K/uL   RBC 2.96 (L) 4.22 - 5.81 MIL/uL   Hemoglobin 9.7 (L) 13.0 - 17.0 g/dL   HCT 29.0 (L) 39.0 -  52.0 %   MCV 98.0 78.0 - 100.0 fL   MCH 32.8 26.0 - 34.0 pg   MCHC 33.4 30.0 - 36.0 g/dL   RDW 14.1 11.5 - 15.5 %   Platelets 214 150 - 400 K/uL   MPV 10.2 8.6 - 12.4 fL  LDL cholesterol, direct  Result Value Ref Range   Direct LDL 95 mg/dL  HgB A1c  Result Value Ref Range   Hemoglobin A1C 7.3       Assessment & Plan:   Problem List Items Addressed This Visit      Cardiovascular and Mediastinum   Essential hypertension    Well-controlled HTN, not on anti-HTN therapy No complications   Plan:  1. No intervention 2. Last SCr 12/2014 elevated but had improved, with AoCKD followed by Urology 3. Lifestyle Mods - Exercise, Dec salt intake, inc K+ rich vegs        Endocrine   Diabetes mellitus type 2, controlled ([place])    Remains well diet-controlled overall - No medications. Last A1c 7.6 in [id number] No complications or hypoglycemia.  Plan:  1. Continue diet / lifestyle control - continue to improve regular exercise 2. No medical therapy. 3. No ACE/ARB, previously due to declined meds. Check urine microalbumin today (no prior testing, had some trace to 30 protein on prior UAs, but prior UTIs frequently). Unable to give urine sample today, ordered future POCT Microalbumin, to schedule lab only apt soon. If elevated, will re-evaluate need for ACE/ARB, consider consult to Nephrology given complicated Urology problem with urinary retention. 4. Foot exam done today 5. Advised to call us with info or have ophtho records sent, for annual DM eye exam 6. Receive Flu shot today 7. RTC 3 mo for A1c      Relevant Orders   Microalbumin, urine     Nervous and  Auditory   HZV (herpes zoster virus) post herpetic neuralgia    Residual itching on chest where rash was. No significant pain.        Genitourinary   BPH (benign prostatic hypertrophy) with urinary obstruction    Likely progressive with inadequate voiding and only 1-2x daily CIC, and BPH.  Plan: 1. Follow-up with Urology as scheduled. Already instructed to CIC up to four times a day, with increasing hydro on Left kidney 04/08/15. 2. Already on flomax and finasteride - still refusing surgery per Urology      CKD stage 3 due to type 2 diabetes mellitus ([place])    SCr gradual increasing trend 1.76 (12/2014) > 1.96 (04/08/15, [facility]), likely from urinary retention also found to have some hydro on Left Renal US. Complicated by DM2.  Plan: 1. Check microalbumin - patient unable to void in clinic, ordered future order patient to return without AM catheter to leave urine sample. Future order placed. 2. Considered ACEi/ARB, however Cr seems rising, not good candidate      Urinary retention    Likely progressive with inadequate voiding and only 1-2x daily CIC, and BPH.  Plan: 1. Follow-up with Urology as scheduled. Already instructed to CIC up to four times a day, with increasing hydro on Left kidney 04/08/15. 2. Already on flomax and finasteride - still refusing surgery per Urology      UTI (urinary tract infection)    Unlikely to be actual UTI, complicated by DM patient with frequent CIC daily. Symptoms with cloudy urine but no other objective findings. Completed bactrim-DS x 7 days.  Plan: 1. No repeat UA. No further antibiotics -  review of Urology note treating itching with CIC, not UTI 2. Follow-up [facility] if worsening symptoms       Other Visit Diagnoses    Encounter for annual physical exam    -  Primary    Need for Tdap vaccination        Relevant Orders    Tdap vaccine greater than or equal to 7yo IM (Completed)       Meds ordered this encounter  Medications  .  DISCONTD: sulfamethoxazole-trimethoprim (BACTRIM DS,SEPTRA DS) 800-160 MG tablet    Sig: TK 1 T PO BID FOR 7 DAYS    Refill:  0      Follow up plan: Return in about 3 months (around 07/24/2015) for diabetes.  Nobie Putnam, [place], PGY-3

## 2015-04-23 NOTE — Patient Instructions (Signed)
Thank you for coming in to clinic today.  1. You are doing well overall for your annual physical. 2. For your Kidneys we will check urine today to look at [facility] or Protein. This will help [place] determine if your diabetes is harming your kidneys. Also this could be due to your enlarged prostate causing blockage of urine 3. Keep following with Dr Amalia Ha[facility] - If your urine stays cloudy or gets worse, and you develop fevers, abdominal pain, blood in urine, nausea / vomiting, please call Dr Amalia Hailey promptly in next few days to ask if you need to be seen again or to extend your prior Antibiotic Course 4. Stay active to keep at a healthy blood sugar level 5. Flu shot and Tetanus shot today  Please schedule a follow-up appointment with Dr. Parks Ranger in 3 months for Diabetes follow-up  If you have any other questions or concerns, please feel free to call the clinic to contact me. You may also schedule an earlier appointment if necessary.  However, if your symptoms get significantly worse, please go to the Emergency Department to seek immediate medical attention.  Richard Moses, [place]

## 2015-04-23 NOTE — Assessment & Plan Note (Signed)
Well-controlled HTN, not on anti-HTN therapy No complications   Plan:  1. No intervention 2. Last SCr 12/2014 elevated but had improved, with AoCKD followed by Urology 3. Lifestyle Mods - Exercise, Dec salt intake, inc K+ rich vegs

## 2015-04-24 ENCOUNTER — Other Ambulatory Visit: Payer: Medicare HMO

## 2015-04-24 DIAGNOSIS — E1122 Type 2 diabetes mellitus with diabetic chronic kidney disease: Secondary | ICD-10-CM

## 2015-04-24 DIAGNOSIS — N183 Chronic kidney disease, stage 3 unspecified: Secondary | ICD-10-CM

## 2015-04-24 NOTE — Assessment & Plan Note (Addendum)
SCr gradual increasing trend 1.76 (12/2014) > 1.96 (04/08/15, [facility]), likely from urinary retention also found to have some hydro on Left Renal US. Complicated by DM2.  Plan: 1. Check microalbumin - patient unable to void in clinic, ordered future order patient to return without AM catheter to leave urine sample. Future order placed. 2. Considered ACEi/ARB, however Cr seems rising, not good candidate

## 2015-04-24 NOTE — Progress Notes (Signed)
microalbumin done today [facility] Ashok Sawaya

## 2015-04-25 LAB — MICROALBUMIN, URINE: Microalb, Ur: 3 mg/dL

## 2015-04-25 NOTE — Assessment & Plan Note (Signed)
Unlikely to be actual UTI, complicated by DM patient with frequent CIC daily. Symptoms with cloudy urine but no other objective findings. Completed bactrim-DS x 7 days.  Plan: 1. No repeat UA. No further antibiotics - review of Urology note treating itching with CIC, not UTI 2. Follow-up [facility] if worsening symptoms

## 2015-04-25 NOTE — Assessment & Plan Note (Signed)
Likely progressive with inadequate voiding and only 1-2x daily CIC, and BPH.  Plan: 1. Follow-up with Urology as scheduled. Already instructed to CIC up to four times a day, with increasing hydro on Left kidney 04/08/15. 2. Already on flomax and finasteride - still refusing surgery per Urology

## 2015-04-25 NOTE — Assessment & Plan Note (Signed)
Residual itching on chest where rash was. No significant pain.

## 2015-07-11 ENCOUNTER — Telehealth: Payer: Self-pay | Admitting: Family Medicine

## 2015-07-11 NOTE — Telephone Encounter (Signed)
Urinary collection system from [facility] has been ordered by the pt.  Dr will be receiving either fax or email from [facility] to verify pt needs this. Pt wanted dr to be aware

## 2015-07-18 ENCOUNTER — Encounter: Payer: Self-pay | Admitting: Family Medicine

## 2015-07-18 NOTE — Telephone Encounter (Signed)
Completed documentation along with separate Patient Letter detailing the diagnosis, and indication for the equipment (letter dated 07/18/15). Placed to be faxed today 07/18/15.  Nobie Putnam, [place], PGY-3

## 2015-07-29 ENCOUNTER — Ambulatory Visit (INDEPENDENT_AMBULATORY_CARE_PROVIDER_SITE_OTHER): Payer: Medicare HMO | Admitting: Family Medicine

## 2015-07-29 ENCOUNTER — Encounter: Payer: Self-pay | Admitting: Family Medicine

## 2015-07-29 VITALS — BP 149/77 | HR 99 | Temp 98.0°F | Ht 71.0 in | Wt 163.0 lb

## 2015-07-29 DIAGNOSIS — E1122 Type 2 diabetes mellitus with diabetic chronic kidney disease: Secondary | ICD-10-CM

## 2015-07-29 DIAGNOSIS — N183 Chronic kidney disease, stage 3 unspecified: Secondary | ICD-10-CM

## 2015-07-29 DIAGNOSIS — G5701 Lesion of sciatic nerve, right lower limb: Secondary | ICD-10-CM

## 2015-07-29 DIAGNOSIS — N138 Other obstructive and reflux uropathy: Secondary | ICD-10-CM

## 2015-07-29 DIAGNOSIS — N401 Enlarged prostate with lower urinary tract symptoms: Secondary | ICD-10-CM | POA: Diagnosis not present

## 2015-07-29 LAB — POCT GLYCOSYLATED HEMOGLOBIN (HGB A1C): Hemoglobin A1C: 6.3

## 2015-07-29 NOTE — Progress Notes (Signed)
Subjective:    Patient ID: TYMIRE BIVINS, male    DOB: 1932/11/17, 82 y.o.   MRN: [id number]  MANJINDER CARAWAY is a 82 y.o. male presenting on 07/29/2015 for Diabetes  HPI   CHRONIC DM, Type 2: Reports thinks he is not doing as well on sugars as he can. But does admit trying to reduce sugar in diet, and eat more vegetables. CBGs: Check 3x weekly, has not checked within past 1-2 weeks. High 143 Low 102. Meds: No DM medications. Diet controlled. Reports good compliance. Tolerating well w/o side-effects Not on ACE/ARB. Taking ASA 81 daily. Denies hypoglycemia, polyuria, visual changes, numbness or tingling.  URINARY RETENTION, with BPH urinary obstruction: - Followed by Dr. Alona Bene ([facility]) - Using Flomax 0.4mg  x 2 daily, Finasteride 5mg  daily - Using new self catheter device system, which I had completed the paperwork for, no other needs - He feels that his prostate is smaller, with urine flow increased. Decreased frequency - Denies any dysuria, hematuria, fevers/chills  Right Hip, Arthritis: - History of prior arthritis in multiple joints including R-hip - Symptoms started with Right Hip / Buttocks region about 1 week ago - Saw chiropractor earlier today 07/29/15, had similar problem few years back, which helped, advised "pinched sciatic nerve" from sitting on wallet - Occasionally takes Tylenol PM with good relief. Does not like other medicines - Admits occasional tingling in Left leg only at night, sometimes radiating pain in Right leg - Denies any numbness, weakness  Social History  Substance Use Topics  . Smoking status: Never Smoker   . Smokeless tobacco: Never Used  . Alcohol Use: No     Comment: occasionally    Review of Systems Per HPI unless specifically indicated above     Objective:    BP 149/77 mmHg  Pulse 99  Temp(Src) 98 F (36.7 C) (Oral)  Ht 5\' 11"  (1.803 m)  Wt 163 lb (73.936 kg)  BMI 22.74 kg/m2  Wt Readings from Last 3 Encounters:    07/29/15 163 lb (73.936 kg)  04/23/15 158 lb 4.8 oz (71.804 kg)  12/06/14 160 lb 14.4 oz (72.984 kg)    Physical Exam  Constitutional: He appears well-developed and well-nourished. No distress.  Well-appearing, comfortable, hearing aids in place  HENT:  Mouth/Throat: Oropharynx is clear and moist.  Eyes:  Chronic R-sided ptosis  Cardiovascular: Normal rate, regular rhythm, normal heart sounds and intact distal pulses.   No murmur heard. Pulmonary/Chest: Effort normal and breath sounds normal. No respiratory distress. He has no rales.  Musculoskeletal:       Right hip: He exhibits normal range of motion, normal strength, no tenderness, no bony tenderness (hip compression without pain), no swelling, no crepitus and no deformity.       Legs: Right hip FABER / FADIR intact without significant pain  Neurological: He is alert.  Skin: Skin is warm and dry. He is not diaphoretic.  Nursing note and vitals reviewed.  Results for orders placed or performed in visit on 07/29/15  POCT HgB A1C (CPT 83036)  Result Value Ref Range   Hemoglobin A1C 6.3       Assessment & Plan:   Problem List Items Addressed This Visit    BPH (benign prostatic hypertrophy) with urinary obstruction    Stable, reportedly improving  Plan: 1. No new plans today. Continue follow-up [facility] 2. Continue self CIC catheters, flomax 0.8 daily, finasteride      Diabetes mellitus type 2, controlled ([place]) - Primary  Well controlled. Diet no meds. A1c 7.3 to 6.3 today Last microalbumin 3.0 normal, not on ACE/ARB. UTD eye exam, due [id number], on ASA No complications or hypoglycemia.  Plan:  1. Continue diet / lifestyle control 2. Continue to hold ACE/ARB with CKD and no microalbuminuria previously. 3. RTC 6 months A1c, BMET, Lipids, if stable can go 6-12 months      Relevant Orders   POCT HgB A1C (CPT 83036) (Completed)   Piriformis syndrome of right side    Improving. Suspected cause of R-hip / buttocks  pain given history, mild symptoms with infrequent R-sciatica. Considered R-hip arthritis, exam not supportive today.  Plan: 1. Reassurance, changes with removing wallet from R-pocket, avoid prolonged sitting 2. Start regular Tylenol PRN and may use Tylenol PM 3. Follow-up chiropractor if helping 4. Return criteria if worsening         No orders of the defined types were placed in this encounter.      Follow up plan: Return in about 6 months (around 01/26/2016) for diabetes.  Nobie Putnam, [place], PGY-3

## 2015-07-29 NOTE — Patient Instructions (Signed)
Thank you for coming in to clinic today.  1. You are doing well 2. Congratulations on the Diabetes control - A1c down to 6.3, from 7.3. - May check sugars up to 3 times weekly, not every week - Keep up good work on diet  3. For Right Hip - may have Piriformis Syndrome (or muscle spasm, causing nerve irritation, also could be some chronic arthritis flare) Recommend to start taking Tylenol Extra Strength 500mg  tabs - take 1 to 2 tabs (max 1000mg  per dose) every 6 hours for pain (take regularly, don't skip a dose for next 3-7 days), max 24 hour daily dose is 6 to 8 tablets or 3000 to 4000mg  - Stay active, continue chiropractor if helping - use heating pad on your low back / hip - if pain starts lasting longer or running down leg with numbness, tingling, then return for re-evaluation  Please schedule a follow-up appointment in July 2017 for Physical / Diabetes follow-up  If you have any other questions or concerns, please feel free to call the clinic to contact me. You may also schedule an earlier appointment if necessary.  However, if your symptoms get significantly worse, please go to the Emergency Department to seek immediate medical attention.  Nobie Putnam, [place]

## 2015-07-30 NOTE — Assessment & Plan Note (Addendum)
Well controlled. Diet no meds. A1c 7.3 to 6.3 today Last microalbumin 3.0 normal, not on ACE/ARB. UTD eye exam, due [id number], on ASA No complications or hypoglycemia.  Plan:  1. Continue diet / lifestyle control 2. Continue to hold ACE/ARB with CKD and no microalbuminuria previously. 3. RTC 6 months A1c, BMET, Lipids, if stable can go 6-12 months

## 2015-07-30 NOTE — Assessment & Plan Note (Signed)
Improving. Suspected cause of R-hip / buttocks pain given history, mild symptoms with infrequent R-sciatica. Considered R-hip arthritis, exam not supportive today.  Plan: 1. Reassurance, changes with removing wallet from R-pocket, avoid prolonged sitting 2. Start regular Tylenol PRN and may use Tylenol PM 3. Follow-up chiropractor if helping 4. Return criteria if worsening

## 2015-07-30 NOTE — Assessment & Plan Note (Signed)
Stable, reportedly improving  Plan: 1. No new plans today. Continue follow-up [facility] 2. Continue self CIC catheters, flomax 0.8 daily, finasteride

## 2015-08-05 ENCOUNTER — Encounter: Payer: Self-pay | Admitting: Family Medicine

## 2015-08-14 NOTE — Telephone Encounter (Signed)
Update - received return of fax documents from [facility], with attending physician Dr Nedra Hai name listed requesting his original signature NPI and date, also edited letter of medical necessity to specifications requested.  Placed in to be faxed pile at [facility] 08/14/15.  Nobie Putnam, [place], PGY-3

## 2015-11-25 ENCOUNTER — Ambulatory Visit (INDEPENDENT_AMBULATORY_CARE_PROVIDER_SITE_OTHER): Payer: Medicare HMO | Admitting: Family Medicine

## 2015-11-25 ENCOUNTER — Encounter: Payer: Self-pay | Admitting: Family Medicine

## 2015-11-25 VITALS — BP 158/81 | HR 89 | Temp 98.3°F | Ht 71.0 in | Wt 160.0 lb

## 2015-11-25 DIAGNOSIS — N138 Other obstructive and reflux uropathy: Secondary | ICD-10-CM

## 2015-11-25 DIAGNOSIS — N3001 Acute cystitis with hematuria: Secondary | ICD-10-CM

## 2015-11-25 DIAGNOSIS — R300 Dysuria: Secondary | ICD-10-CM | POA: Diagnosis not present

## 2015-11-25 DIAGNOSIS — N401 Enlarged prostate with lower urinary tract symptoms: Secondary | ICD-10-CM | POA: Diagnosis not present

## 2015-11-25 LAB — POCT URINALYSIS DIPSTICK
Bilirubin, UA: NEGATIVE
Glucose, UA: NEGATIVE
Ketones, UA: NEGATIVE
NITRITE UA: NEGATIVE
PH UA: 6.5
PROTEIN UA: 100
SPEC GRAV UA: 1.02
UROBILINOGEN UA: 0.2

## 2015-11-25 LAB — POCT UA - MICROSCOPIC ONLY

## 2015-11-25 MED ORDER — CEPHALEXIN 500 MG PO CAPS: 500.0000 mg | ORAL_CAPSULE | Freq: Two times a day (BID) | ORAL | Status: DC

## 2015-11-25 NOTE — Progress Notes (Signed)
Subjective:    Patient ID: Richard Moses, male    DOB: May 23, 1933, 82 y.o.   MRN: [id number]  Richard Moses is a 82 y.o. male presenting on 11/25/2015 for Dysuria   Patient presents for a same day appointment.    HPI   DYSURIA / UTI SYMPTOMS / H/o URINARY RETENTION, with BPH urinary obstruction: - Followed by Dr. Alona Bene ([facility]), currently taking Flomax 0.4mg  x 2 daily, Finasteride 5mg  daily - Reports symptoms with "terrible itching and burning sensation" inside urethra and tip of penis over past 1 month, suspected this is caused by the intermittent catheters he is using. Previously was on larger catheter over past yr this caused similar problem, his Urologist Dr Amalia Hailey had arranged to get him a smaller self lubricating catheter. No longer using catheter for past 2-3 weeks. He is able to void spontaneously, with some urinary frequency but overall voiding less, admits nocturia - Next apt with Dr Amalia Hailey on 01/13/16 - Admits occasional thicker greyish urine at times, describes that he collects urine in a cup and when it settles later sees a layer - Admits dysuria - Denies hematuria, fevers/chills, abdominal pain / flank pain, sexual activity or STD exposure, penile discharge   Social History  Substance Use Topics  . Smoking status: Never Smoker   . Smokeless tobacco: Never Used  . Alcohol Use: No     Comment: occasionally    Review of Systems Per HPI unless specifically indicated above     Objective:    BP 158/81 mmHg  Pulse 89  Temp(Src) 98.3 F (36.8 C) (Oral)  Ht 5\' 11"  (1.803 m)  Wt 160 lb (72.576 kg)  BMI 22.33 kg/m2  Wt Readings from Last 3 Encounters:  11/25/15 160 lb (72.576 kg)  07/29/15 163 lb (73.936 kg)  04/23/15 158 lb 4.8 oz (71.804 kg)    Physical Exam  Constitutional: He appears well-developed and well-nourished. No distress.  Well-appearing, comfortable, hearing aids in place  HENT:  Mouth/Throat: Oropharynx is clear and moist.    Cardiovascular: Normal rate.   Abdominal: Soft. Bowel sounds are normal. He exhibits no distension. There is no tenderness.  Musculoskeletal: He exhibits no edema.  Neurological: He is alert.  Skin: Skin is warm and dry. He is not diaphoretic.  Nursing note and vitals reviewed.      Assessment & Plan:   Problem List Items Addressed This Visit    UTI (urinary tract infection) - Primary    Clinically consistent with UTI and confirmed on UA with TNTC WBC, patient is high risk with DM2 and BPH with urinary obstruction using clean intermittent catheterization (however thought catheter was causing irritation or problem and since stopped for few weeks). No concern for pyelo today (no systemic symptoms, neg fever, back pain, n/v).  Plan: 1. Ordered Urine culture - follow-up results (did not see yeast on micro) 2. Keflex 500mg  BID x 7 days (reduced dose max 1000mg  daily given reduced GFR CKD-III) 3. Improve PO hydration 4. 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  Start empiric Keflex, pending urine culture. No symptoms of prostatitis. No evidence acute urinary retention, still voiding even without catheter. Follow-up with Urology      Relevant Orders   POCT UA - Microscopic Only (Completed)    Other Visit Diagnoses    Dysuria        Relevant Orders    Urinalysis Dipstick (Completed)    POCT UA - Microscopic Only (Completed)       Meds ordered this encounter  Medications  . cephALEXin (KEFLEX) 500 MG capsule    Sig: Take 1 capsule (500 mg  total) by mouth 2 (two) times daily. For 7 days    Dispense:  14 capsule    Refill:  0      Follow up plan: Return in about 1 week (around 12/02/2015) for physical.  Nobie Putnam, [place], PGY-3

## 2015-11-25 NOTE — Assessment & Plan Note (Signed)
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 Start empiric Keflex, pending urine culture. No symptoms of prostatitis. No evidence acute urinary retention, still voiding even without catheter. Follow-up with Urology

## 2015-11-25 NOTE — Patient Instructions (Signed)
Thank you for coming in to clinic today.  1. You have a Urinary Tract Infection - you should get better within 1 week on the antibiotics - Start Keflex 500mg  2 times daily for next 7 days, complete entire course, even if feeling better - We sent urine for a culture, we will call you within next few days if we need to change antibiotics - Also, we will check urine for Yeast (if this is positive, then I will send in a different antibiotic for you to take) will notify you later this week if we need to make this change - Please drink plenty of fluids, improve hydration over next 1 week  If symptoms worsening, developing nausea / vomiting, worsening back pain, fevers / chills / sweats, then please return for re-evaluation sooner.  Recommend close follow-up with Dr Amalia Hailey to discuss Catheters. If you are unable to pee at all for >24 hours, it is an emergency you need to go to ED or Urologist office.  Follow-up as scheduled in 1 week for physical  If you have any other questions or concerns, please feel free to call the clinic to contact me. You may also schedule an earlier appointment if necessary.  However, if your symptoms get significantly worse, please go to the Emergency Department to seek immediate medical attention.  Richard Moses, [place]

## 2015-11-25 NOTE — Assessment & Plan Note (Signed)
Clinically consistent with UTI and confirmed on UA with TNTC WBC, patient is high risk with DM2 and BPH with urinary obstruction using clean intermittent catheterization (however thought catheter was causing irritation or problem and since stopped for few weeks). No concern for pyelo today (no systemic symptoms, neg fever, back pain, n/v).  Plan: 1. Ordered Urine culture - follow-up results (did not see yeast on micro) 2. Keflex 500mg  BID x 7 days (reduced dose max 1000mg  daily given reduced GFR CKD-III) 3. Improve PO hydration 4. Follow-up within 1 week as planned for physical, also close follow-up with Urology to discuss catheterizations (patient has declined BPH surgery in past, and seems refractory to medications)

## 2015-11-26 LAB — URINE CULTURE
COLONY COUNT: NO GROWTH
ORGANISM ID, BACTERIA: NO GROWTH

## 2015-12-02 ENCOUNTER — Ambulatory Visit (INDEPENDENT_AMBULATORY_CARE_PROVIDER_SITE_OTHER): Payer: Medicare HMO | Admitting: Family Medicine

## 2015-12-02 ENCOUNTER — Encounter: Payer: Self-pay | Admitting: Family Medicine

## 2015-12-02 VITALS — BP 145/84 | HR 87 | Temp 97.8°F | Ht 71.0 in | Wt 162.0 lb

## 2015-12-02 DIAGNOSIS — I10 Essential (primary) hypertension: Secondary | ICD-10-CM | POA: Diagnosis not present

## 2015-12-02 DIAGNOSIS — E785 Hyperlipidemia, unspecified: Secondary | ICD-10-CM

## 2015-12-02 DIAGNOSIS — N183 Chronic kidney disease, stage 3 unspecified: Secondary | ICD-10-CM

## 2015-12-02 DIAGNOSIS — N138 Other obstructive and reflux uropathy: Secondary | ICD-10-CM

## 2015-12-02 DIAGNOSIS — B3741 Candidal cystitis and urethritis: Secondary | ICD-10-CM

## 2015-12-02 DIAGNOSIS — E1122 Type 2 diabetes mellitus with diabetic chronic kidney disease: Secondary | ICD-10-CM | POA: Diagnosis not present

## 2015-12-02 DIAGNOSIS — N401 Enlarged prostate with lower urinary tract symptoms: Secondary | ICD-10-CM | POA: Diagnosis not present

## 2015-12-02 LAB — POCT GLYCOSYLATED HEMOGLOBIN (HGB A1C): HEMOGLOBIN A1C: 6.8

## 2015-12-02 MED ORDER — FLUCONAZOLE 150 MG PO TABS: ORAL_TABLET | ORAL | Status: DC

## 2015-12-02 NOTE — Assessment & Plan Note (Signed)
Likely secondary to DM / HTN and BPH with urinary obstruction/retention. Check BMET to follow Cr Control DM/HTN, and now self caths for avoiding urinary retention Follow-up Urology as planned

## 2015-12-02 NOTE — Progress Notes (Signed)
Subjective:    Patient ID: Richard Moses, male    DOB: 10-31-1932, 82 y.o.   MRN: [id number]  Richard Moses is a 82 y.o. male presenting on 12/02/2015 for Diabetes  HPI   CHRONIC DM, Type 2: Reports not always adhering to DM diet, inc sugar consumed. CBGs: Has not checked recently, does not recall numbers, previously was checking 1-3x weekly, reports < 200 Meds: No DM medications. Diet controlled. Not on ACE/ARB. Taking ASA 81 daily. - Previously followed by Podiatry for variety of issues, no issues with peripheral neuropathy - Exercise with elastic pull straps for stretching upper extremities - Complication with CKD-III also with urinary obstruction as contributing etiology Denies hypoglycemia, polyuria, visual changes, numbness or tingling.  URINARY IRRITATION / UTI SYMPTOMS / URINARY RETENTION, with BPH urinary obstruction: - Last seen 11/25/15, had UTI symptoms, treated with Keflex empirically, checked Urine Culture which showed No Growth - Now today presents with persistent urinary irritation at times, but overall states his urine has cleared significantly since taking the antibiotic. Not regularly doing self catheterizations, able to void spontaneously. - Followed by Dr. Alona Bene ([facility]) - Using Flomax 0.4mg  x 2 daily, Finasteride 5mg  daily - Denies any dysuria, hematuria, fevers/chills  CHRONIC HTN: Reports checking BP outside office within normal range, not recording readings. Today initially elevated. Current Meds - none   Denies CP, dyspnea, HA, edema, dizziness / lightheadedness  HLD: - Prior history of elevated TG in setting of DM, otherwise appropriate HDL and elevated LDL. Last year LDL improved to 95. Currently not fasting today. Not treated with statin therapy, on variety of supplements including omega 3 fish oil. Limited cardiovascular exercise.  Social History  Substance Use Topics  . Smoking status: Never Smoker   . Smokeless tobacco: Never Used  .  Alcohol Use: No     Comment: occasionally    Review of Systems Per HPI unless specifically indicated above     Objective:    BP 145/84 mmHg  Pulse 87  Temp(Src) 97.8 F (36.6 C) (Oral)  Ht 5\' 11"  (1.803 m)  Wt 162 lb (73.483 kg)  BMI 22.60 kg/m2  Wt Readings from Last 3 Encounters:  12/02/15 162 lb (73.483 kg)  11/25/15 160 lb (72.576 kg)  07/29/15 163 lb (73.936 kg)    Physical Exam  Constitutional: He appears well-developed and well-nourished. No distress.  Well-appearing, comfortable, hearing aids in place  HENT:  Mild dry mucus membranes  Eyes:  Chronic R-sided ptosis  Cardiovascular: Normal rate, regular rhythm, normal heart sounds and intact distal pulses.   No murmur heard. Pulmonary/Chest: Effort normal and breath sounds normal. No respiratory distress. He has no rales.  Neurological: He is alert.  Skin: Skin is warm and dry. He is not diaphoretic.  Nursing note and vitals reviewed.  Results for orders placed or performed in visit on 12/02/15  HgB A1c  Result Value Ref Range   Hemoglobin A1C 6.8       Assessment & Plan:   Problem List Items Addressed This Visit    Hyperlipidemia    Check direct LDL Consider future statin use if worsening along with DM and inc ASCVD risk      Relevant Orders   LDL cholesterol, direct   Essential hypertension    Mildly elevated initially, still remains near goal < 140/90 for elderly DM patient H/o CKD-III  Plan: 1. No anti-HTN therapy today 2. Check BMET to monitor SCr 3. Monitor BP outside office 4. Follow-up  Relevant Orders   BASIC METABOLIC PANEL WITH GFR   Diabetes mellitus type 2, controlled (HCC) - Primary    Controlled, gradual A1c inc from 6.3 to 6.8 Not on ACEi/ARB, last microalbumin normal Complicated with CKD-III in setting of BPH with urinary obstruction as likely factor  Plan: 1. Encouraged improve DM diet / exercise to continue off medications for DM / HTN 2. Check direct LDL today, not  on statin therapy 3. RTC 3 months A1c      Relevant Orders   HgB A1c (Completed)   BASIC METABOLIC PANEL WITH GFR   CKD stage 3 due to type 2 diabetes mellitus ([place])    Likely secondary to DM / HTN and BPH with urinary obstruction/retention. Check BMET to follow Cr Control DM/HTN, and now self caths for avoiding urinary retention Follow-up Urology as planned      Relevant Orders   BASIC METABOLIC PANEL WITH GFR   BPH (benign prostatic hypertrophy) with urinary obstruction    Stable persistent problem, overall seems gradual improved on med treatment with Flomax and Finasteride, no longer self catheterizing daily basis but no evidence acute urinary retention - Likely irritation from catheters and possible now residual yeast cystitis following antibiotic  Plan: 1. Follow-up with Urology Dr Amalia Hailey regarding future catheterization plan, given various issues with this 2. Continue Flomax, Finasteride       Other Visit Diagnoses    Yeast cystitis        Relevant Medications    fluconazole (DIFLUCAN) 150 MG tablet       Meds ordered this encounter  Medications  . fluconazole (DIFLUCAN) 150 MG tablet    Sig: Take one tablet by mouth on Day 1. Repeat dose 2nd tablet on Day 3.    Dispense:  2 tablet    Refill:  0      Follow up plan: Return in about 3 months (around 03/03/2016) for diabetes, A1c.  Nobie Putnam, [place], PGY-3

## 2015-12-02 NOTE — Assessment & Plan Note (Signed)
Stable persistent problem, overall seems gradual improved on med treatment with Flomax and Finasteride, no longer self catheterizing daily basis but no evidence acute urinary retention - Likely irritation from catheters and possible now residual yeast cystitis following antibiotic  Plan: 1. Follow-up with Urology Dr Amalia Hailey regarding future catheterization plan, given various issues with this 2. Continue Flomax, Finasteride

## 2015-12-02 NOTE — Patient Instructions (Addendum)
Thank you for coming in to clinic today.  1. Today your blood sugar reading is elevated to 6.8 (last one was 6.3), this is still at goal less than 7.0, but gradual increase has me a little concerned. I recommend keeping up with exercise and trying to reduce carbs with diet, since you are not on diabetes medications. Recommend re-check A1c in 3 months, if still increasing >7 then may need to discuss other options for treatment. 2. Blood pressure mildly elevated today, seems to be stable within goal < 150 / 90, keep checking outside of office to monitor it 3. For urinary irritation, may be yeast infection, will go ahead and treat with Diflucan yeast pill 1 dose and then take 2nd dose on Day 3, otherwise if still not fully resolved it may be due to the catheter, would recommend follow-up with Dr Amalia Hailey Urology but important to still empty bladder, if you are not voiding on your own need to use catheter  Recommend scheduling an Annual Medicare Wellness visit with one of our FM Nurse Staff - ask front desk about scheduling this FREE visit, not to see doctor but to update your chart and prevention.  Please schedule a follow-up appointment with Dr Lorenso Courier in 3 months for Diabetes A1c follow-up and meet new doctor. Remember you may switch to doctor of your choice at our residency program by speaking with front office.  If you have any other questions or concerns, please feel free to call the clinic to contact me. You may also schedule an earlier appointment if necessary.  However, if your symptoms get significantly worse, please go to the Emergency Department to seek immediate medical attention.  Nobie Putnam, [place]

## 2015-12-02 NOTE — Assessment & Plan Note (Addendum)
Check direct LDL Consider future statin use if worsening along with DM and inc ASCVD risk

## 2015-12-02 NOTE — Assessment & Plan Note (Signed)
Controlled, gradual A1c inc from 6.3 to 6.8 Not on ACEi/ARB, last microalbumin normal Complicated with CKD-III in setting of BPH with urinary obstruction as likely factor  Plan: 1. Encouraged improve DM diet / exercise to continue off medications for DM / HTN 2. Check direct LDL today, not on statin therapy 3. RTC 3 months A1c

## 2015-12-02 NOTE — Assessment & Plan Note (Signed)
Mildly elevated initially, still remains near goal < 140/90 for elderly DM patient H/o CKD-III  Plan: 1. No anti-HTN therapy today 2. Check BMET to monitor SCr 3. Monitor BP outside office 4. Follow-up

## 2015-12-03 ENCOUNTER — Telehealth: Payer: Self-pay | Admitting: Family Medicine

## 2015-12-03 ENCOUNTER — Ambulatory Visit: Payer: Medicare HMO | Admitting: Family Medicine

## 2015-12-03 LAB — BASIC METABOLIC PANEL WITH GFR
BUN: 41 mg/dL — ABNORMAL HIGH (ref 7–25)
CALCIUM: 8.7 mg/dL (ref 8.6–10.3)
CHLORIDE: 107 mmol/L (ref 98–110)
CO2: 22 mmol/L (ref 20–31)
Creat: 1.9 mg/dL — ABNORMAL HIGH (ref 0.70–1.11)
GFR, EST NON AFRICAN AMERICAN: 32 mL/min — AB (ref >=60)
GFR, Est African American: 37 mL/min — ABNORMAL LOW (ref >=60)
GLUCOSE: 107 mg/dL — AB (ref 65–99)
Potassium: 4.6 mmol/L (ref 3.5–5.3)
SODIUM: 140 mmol/L (ref 135–146)

## 2015-12-03 LAB — LDL CHOLESTEROL, DIRECT: Direct LDL: 101 mg/dL (ref <130)

## 2015-12-03 NOTE — Telephone Encounter (Signed)
Last seen by me 12/02/15 for annual physical, checked screening labs with BMET, direct LDL. Results as follows:  1. BMET - Normal electrolytes, BUN 41 and Cr up to 1.90 (from 1.76), mild elevated glucose at 107. Known DM2, A1c 6.8, remains at goal < 7.0  2. Direct LDL 101 - Stable  Called patient, discussed above results. Plan: - Discussed gradual rising Cr, consistent with known CKD-III, likely with urinary retention, however he reports spontaneous voiding has improved, no longer using self catheters still due to urethra irritation. Tried Diflucan as rx by me with mild improvement, will try 2nd dose to see if still improving, prior history failure on lidocaine gel for numbing of urethra. He has not followed-up or contacted Dr Amalia Hailey office on this concern - If still urethra irritation after finishing Diflucan, notify us within 1-2 weeks, can consider longer course 1-2 week of 150-200mg  daily as possibility, otherwise i advised him limited options as this most likely is mechanical irritation from foley catheter, next step is to follow-up with Urology Dr Amalia Hailey - Strict return criteria if unable to void on own, distended bladder, no voiding >24 hours, needs to go to ED - Follow-up as planned monitor Cr q 6 months  Nobie Putnam, [place], PGY-3

## 2015-12-11 ENCOUNTER — Ambulatory Visit (INDEPENDENT_AMBULATORY_CARE_PROVIDER_SITE_OTHER): Payer: Medicare HMO | Admitting: *Deleted

## 2015-12-11 ENCOUNTER — Encounter: Payer: Self-pay | Admitting: *Deleted

## 2015-12-11 VITALS — BP 119/66 | HR 84 | Ht 72.0 in | Wt 162.0 lb

## 2015-12-11 DIAGNOSIS — Z0000 Encounter for general adult medical examination without abnormal findings: Secondary | ICD-10-CM

## 2015-12-11 NOTE — Patient Instructions (Addendum)
Thank you so much for coming in today! I've given you a separate handout with upcoming screenings.  Your depression screening score was 9. You may want to schedule an appointment with Dr. Lorenso Courier to talk about ways to address this.  Please come back in a year for another annual wellness visit. It was great meeting you. Have a great day!   Richard Moses    Screening for Type 2 Diabetes Screening is a way to check for type 2 diabetes in people who do not have symptoms of the disease, but who may likely develop diabetes in the future. Diabetes can lead to serious health problems, but finding diabetes early allows for early treatment. DIABETES RISK FACTORS   Family history of diabetes.  Diseases of the pancreas.  Obesity or being overweight.  Certain racial or ethnic groups:  American [place].  Pacific Islander.  Hispanic.  Asian.  African American.  High blood pressure (hypertension).  History of diabetes while pregnant (gestational diabetes).  Delivering a baby that weighed over 9 pounds.  Being inactive.  High cholesterol or triglycerides.  Age, especially over 53 years of age.  Other diseases or conditions.  Diseases of the pancreas.  Cardiovascular disease.  Disorders of the endocrine system.  Certain medicines, such as those that treat high blood cholesterol levels. WHO IS SCREENED Adults  Adults who have no risk factors and no symptoms should be screened starting at age 39. If the screening tests are normal, they should be repeated every 3 years.  Adults who do not have symptoms, but have 1 or more risk factors, should be screened.  Adults who have 2 or more risk factors may be screened every year.  Adults who have an A1c (3 month average of blood glucose) greater than 5.7% or who had an impaired glucose tolerance (IGT) or impaired fasting glucose (IFG) on a previous test should be screened.  Pregnant women who have risk factors should be screened at their  first prenatal visit.  Women who have given birth and had gestational diabetes should be screened 6-12 weeks after the child is born. This screening should be repeated every 1-3 years after the first test. Children or Adolescents  Children and adolescents should be screened for type 2 diabetes if they are overweight and have 2 of the following risk factors:  Having a family history of type 2 diabetes.  Being a member of a high risk race or ethnic group.  Having signs of insulin resistance or conditions associated with insulin resistance.  Having a mother who had gestational diabetes while pregnant with him or her.  Screening should start at age 28 or at the onset of puberty, whichever comes first. This should be repeated every 2 years. SCREENING In a screening, your caregiver may:  Ask questions about your overall health. This will include questions about the health of close family members, too.  Ask about any diabetes-like symptoms you may have.  Perform a physical exam.  Order some tests that may include:  A fasting plasma glucose test. This measures the level of glucose in your blood. It is done after you have had nothing to eat but water (fasted) for 8 hours.  A random blood glucose test. This test is done without the need to fast.  An oral glucose tolerance test. This is a blood test done in 2 parts. First, a blood sample is taken after you have fasted. Then, another sample is taken after you drink a liquid that contains a lot  of sugar.  An A1c test. This test shows how much glucose has been in your blood over the past 2 to 3 months.   This information is not intended to replace advice given to you by your health care provider. Make sure you discuss any questions you have with your health care provider.   Document Released: 03/20/2009 Document Revised: 06/14/2014 Document Reviewed: 12/30/2010 Elsevier Interactive Patient Education 2016 [place].  Diabetes and Foot  Care Diabetes may cause you to have problems because of poor blood supply (circulation) to your feet and legs. This may cause the skin on your feet to become thinner, break easier, and heal more slowly. Your skin may become dry, and the skin may peel and crack. You may also have nerve damage in your legs and feet causing decreased feeling in them. You may not notice minor injuries to your feet that could lead to infections or more serious problems. Taking care of your feet is one of the most important things you can do for yourself.  HOME CARE INSTRUCTIONS  Wear shoes at all times, even in the house. Do not go barefoot. Bare feet are easily injured.  Check your feet daily for blisters, cuts, and redness. If you cannot see the bottom of your feet, use a mirror or ask someone for help.  Wash your feet with warm water (do not use hot water) and mild soap. Then pat your feet and the areas between your toes until they are completely dry. Do not soak your feet as this can dry your skin.  Apply a moisturizing lotion or petroleum jelly (that does not contain alcohol and is unscented) to the skin on your feet and to dry, brittle toenails. Do not apply lotion between your toes.  Trim your toenails straight across. Do not dig under them or around the cuticle. File the edges of your nails with an emery board or nail file.  Do not cut corns or calluses or try to remove them with medicine.  Wear clean socks or stockings every day. Make sure they are not too tight. Do not wear knee-high stockings since they may decrease blood flow to your legs.  Wear shoes that fit properly and have enough cushioning. To break in new shoes, wear them for just a few hours a day. This prevents you from injuring your feet. Always look in your shoes before you put them on to be sure there are no objects inside.  Do not cross your legs. This may decrease the blood flow to your feet.  If you find a minor scrape, cut, or break in  the skin on your feet, keep it and the skin around it clean and dry. These areas may be cleansed with mild soap and water. Do not cleanse the area with peroxide, alcohol, or iodine.  When you remove an adhesive bandage, be sure not to damage the skin around it.  If you have a wound, look at it several times a day to make sure it is healing.  Do not use heating pads or hot water bottles. They may burn your skin. If you have lost feeling in your feet or legs, you may not know it is happening until it is too late.  Make sure your health care provider performs a complete foot exam at least annually or more often if you have foot problems. Report any cuts, sores, or bruises to your health care provider immediately. SEEK MEDICAL CARE IF:   You have an  injury that is not healing.  You have cuts or breaks in the skin.  You have an ingrown nail.  You notice redness on your legs or feet.  You feel burning or tingling in your legs or feet.  You have pain or cramps in your legs and feet.  Your legs or feet are numb.  Your feet always feel cold. SEEK IMMEDIATE MEDICAL CARE IF:   There is increasing redness, swelling, or pain in or around a wound.  There is a red line that goes up your leg.  Pus is coming from a wound.  You develop a fever or as directed by your health care provider.  You notice a bad smell coming from an ulcer or wound.   This information is not intended to replace advice given to you by your health care provider. Make sure you discuss any questions you have with your health care provider.   Document Released: 05/21/2000 Document Revised: 01/24/2013 Document Reviewed: 10/31/2012 Elsevier Interactive Patient Education 2016 [place] Maintenance, Male A healthy lifestyle and preventative care can promote health and wellness.  Maintain regular health, dental, and eye exams.  Eat a healthy diet. Foods like vegetables, fruits, whole grains, low-fat dairy  products, and lean protein foods contain the nutrients you need and are low in calories. Decrease your intake of foods high in solid fats, added sugars, and salt. Get information about a proper diet from your health care provider, if necessary.  Regular physical exercise is one of the most important things you can do for your health. Most adults should get at least 150 minutes of moderate-intensity exercise (any activity that increases your heart rate and causes you to sweat) each week. In addition, most adults need muscle-strengthening exercises on 2 or more days a week.   Maintain a healthy weight. The body mass index (BMI) is a screening tool to identify possible weight problems. It provides an estimate of body fat based on height and weight. Your health care provider can find your BMI and can help you achieve or maintain a healthy weight. For males 20 years and older:  A BMI below 18.5 is considered underweight.  A BMI of 18.5 to 24.9 is normal.  A BMI of 25 to 29.9 is considered overweight.  A BMI of 30 and above is considered obese.  Maintain normal blood lipids and cholesterol by exercising and minimizing your intake of saturated fat. Eat a balanced diet with plenty of fruits and vegetables. Blood tests for lipids and cholesterol should begin at age 67 and be repeated every 5 years. If your lipid or cholesterol levels are high, you are over age 4, or you are at high risk for heart disease, you may need your cholesterol levels checked more frequently.Ongoing high lipid and cholesterol levels should be treated with medicines if diet and exercise are not working.  If you smoke, find out from your health care provider how to quit. If you do not use tobacco, do not start.  Lung cancer screening is recommended for adults aged 34-80 years who are at high risk for developing lung cancer because of a history of smoking. A yearly low-dose CT scan of the lungs is recommended for people who have at  least a 30-pack-year history of smoking and are current smokers or have quit within the past 15 years. A pack year of smoking is smoking an average of 1 pack of cigarettes a day for 1 year (for example, a 30-pack-year history  of smoking could mean smoking 1 pack a day for 30 years or 2 packs a day for 15 years). Yearly screening should continue until the smoker has stopped smoking for at least 15 years. Yearly screening should be stopped for people who develop a health problem that would prevent them from having lung cancer treatment.  If you choose to drink alcohol, do not have more than 2 drinks per day. One drink is considered to be 12 oz (360 mL) of beer, 5 oz (150 mL) of wine, or 1.5 oz (45 mL) of liquor.  Avoid the use of street drugs. Do not share needles with anyone. Ask for help if you need support or instructions about stopping the use of drugs.  High blood pressure causes heart disease and increases the risk of stroke. High blood pressure is more likely to develop in:  People who have blood pressure in the end of the normal range (100-139/85-89 mm Hg).  People who are overweight or obese.  People who are African American.  If you are 24-70 years of age, have your blood pressure checked every 3-5 years. If you are 9 years of age or older, have your blood pressure checked every year. You should have your blood pressure measured twice--once when you are at a hospital or clinic, and once when you are not at a hospital or clinic. Record the average of the two measurements. To check your blood pressure when you are not at a hospital or clinic, you can use:  An automated blood pressure machine at a pharmacy.  A home blood pressure monitor.  If you are 71-8 years old, ask your health care provider if you should take aspirin to prevent heart disease.  Diabetes screening involves taking a blood sample to check your fasting blood sugar level. This should be done once every 3 years after age  45 if you are at a normal weight and without risk factors for diabetes. Testing should be considered at a younger age or be carried out more frequently if you are overweight and have at least 1 risk factor for diabetes.  Colorectal cancer can be detected and often prevented. Most routine colorectal cancer screening begins at the age of 43 and continues through age 41. However, your health care provider may recommend screening at an earlier age if you have risk factors for colon cancer. On a yearly basis, your health care provider may provide home test kits to check for hidden blood in the stool. A small camera at the end of a tube may be used to directly examine the colon (sigmoidoscopy or colonoscopy) to detect the earliest forms of colorectal cancer. Talk to your health care provider about this at age 66 when routine screening begins. A direct exam of the colon should be repeated every 5-10 years through age 32, unless early forms of precancerous polyps or small growths are found.  People who are at an increased risk for hepatitis B should be screened for this virus. You are considered at high risk for hepatitis B if:  You were born in a country where hepatitis B occurs often. Talk with your health care provider about which countries are considered high risk.  Your parents were born in a high-risk country and you have not received a shot to protect against hepatitis B (hepatitis B vaccine).  You have HIV or AIDS.  You use needles to inject street drugs.  You live with, or have sex with, someone who has hepatitis B.  You are a man who has sex with other men (MSM).  You get hemodialysis treatment.  You take certain medicines for conditions like cancer, organ transplantation, and autoimmune conditions.  Hepatitis C blood testing is recommended for all people born from [birth year] through 1963 and any individual with known risk factors for hepatitis C.  Healthy men should no longer receive  prostate-specific antigen (PSA) blood tests as part of routine cancer screening. Talk to your health care provider about prostate cancer screening.  Testicular cancer screening is not recommended for adolescents or adult males who have no symptoms. Screening includes self-exam, a health care provider exam, and other screening tests. Consult with your health care provider about any symptoms you have or any concerns you have about testicular cancer.  Practice safe sex. Use condoms and avoid high-risk sexual practices to reduce the spread of sexually transmitted infections (STIs).  You should be screened for STIs, including gonorrhea and chlamydia if:  You are sexually active and are younger than 24 years.  You are older than 24 years, and your health care provider tells you that you are at risk for this type of infection.  Your sexual activity has changed since you were last screened, and you are at an increased risk for chlamydia or gonorrhea. Ask your health care provider if you are at risk.  If you are at risk of being infected with HIV, it is recommended that you take a prescription medicine daily to prevent HIV infection. This is called pre-exposure prophylaxis (PrEP). You are considered at risk if:  You are a man who has sex with other men (MSM).  You are a heterosexual man who is sexually active with multiple partners.  You take drugs by injection.  You are sexually active with a partner who has HIV.  Talk with your health care provider about whether you are at high risk of being infected with HIV. If you choose to begin PrEP, you should first be tested for HIV. You should then be tested every 3 months for as long as you are taking PrEP.  Use sunscreen. Apply sunscreen liberally and repeatedly throughout the day. You should seek shade when your shadow is shorter than you. Protect yourself by wearing long sleeves, pants, a wide-brimmed hat, and sunglasses year round whenever you are  outdoors.  Tell your health care provider of new moles or changes in moles, especially if there is a change in shape or color. Also, tell your health care provider if a mole is larger than the size of a pencil eraser.  A one-time screening for abdominal aortic aneurysm (AAA) and surgical repair of large AAAs by ultrasound is recommended for men aged 15-75 years who are current or former smokers.  Stay current with your vaccines (immunizations).   This information is not intended to replace advice given to you by your health care provider. Make sure you discuss any questions you have with your health care provider.   Document Released: 11/20/2007 Document Revised: 06/14/2014 Document Reviewed: 10/19/2010 Elsevier Interactive Patient Education [employer].
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
## 2016-03-03 ENCOUNTER — Ambulatory Visit (INDEPENDENT_AMBULATORY_CARE_PROVIDER_SITE_OTHER): Payer: Medicare HMO | Admitting: Family Medicine

## 2016-03-03 ENCOUNTER — Encounter: Payer: Self-pay | Admitting: Family Medicine

## 2016-03-03 VITALS — BP 150/84 | HR 89 | Temp 98.3°F | Ht 72.0 in | Wt 165.0 lb

## 2016-03-03 DIAGNOSIS — Z23 Encounter for immunization: Secondary | ICD-10-CM

## 2016-03-03 DIAGNOSIS — N183 Chronic kidney disease, stage 3 (moderate): Secondary | ICD-10-CM | POA: Diagnosis not present

## 2016-03-03 DIAGNOSIS — B353 Tinea pedis: Secondary | ICD-10-CM | POA: Insufficient documentation

## 2016-03-03 DIAGNOSIS — E1122 Type 2 diabetes mellitus with diabetic chronic kidney disease: Secondary | ICD-10-CM | POA: Diagnosis not present

## 2016-03-03 LAB — POCT GLYCOSYLATED HEMOGLOBIN (HGB A1C): Hemoglobin A1C: 5.9

## 2016-03-03 NOTE — Progress Notes (Signed)
Subjective:   Patient ID: Richard Moses    DOB: [birth year], 82 y.o. male   MRN: [id number]  CC: Right Big Toe Nail Problem  HPI: Richard Moses is a 82 y.o. male who presents to clinic today for DM f/u and assessment of right big toe concerning for fungal infection. Problems discussed today are as follows:  1. Diabetes Mellitus Type 2: seen 3 months ago for A1C of 6.8. Patient is not taking prescribed diabetes medications, only OTC herbal remedies and supplements. Denies symptoms of polyuria, polydipsia, polyphagia, paresthesia, nausea or vomiting.  2. Fungal Infection of Right Big Toe Nail: patient noticed bleeding and skin breakdown of right great toe over past week. Denies trauma. Does involve the medial aspect of 2nd toe too. Denies paresthesia, pain, decreased motor function. Sees a podiatrist in town but is due for follow up.  ROS: See HPI for pertinent ROS.  Richard Moses: Pertinent past medical, surgical, family, and social history were reviewed and updated as appropriate. Smoking status reviewed.  Medications reviewed. Current Outpatient Prescriptions  Medication Sig Dispense Refill  . Alpha-Lipoic Acid 300 MG TABS Take 600 mg by mouth 3 (three) times daily.    Jolyne Loa Grape-Goldenseal (BERBERINE COMPLEX PO) Take 500 mg by mouth 3 (three) times daily with meals. Reported on 12/11/2015    . Blood Glucose Calibration (SURECHEK CONTROL SOLUTION) HIGH LIQD Use to test glucometer 1 each 3  . Blood Glucose Calibration (SURECHEK CONTROL SOLUTION) LOW LIQD Use to test glucometer 1 each 3  . cholecalciferol (VITAMIN D) 400 UNITS TABS tablet Take 1,000 Units by mouth 3 (three) times daily with meals.    . Chromium Picolinate 200 MCG CAPS Take 66 mcg by mouth 3 (three) times daily with meals.    . finasteride (PROSCAR) 5 MG tablet   3  . glucose blood test strip Check blood sugar once daily as instructed. 100 each 4  . Grape Seed Extract 100 MG CAPS Take 150 mg by mouth daily.    .  Iodine, Kelp, 0.15 MG TABS Take 1 tablet by mouth daily.    . Lutein 20 MG TABS Take 1 tablet by mouth daily.    . Mag Oxide-Vit D3-Turmeric (MAGNESIUM-VITAMIN D3-TURMERIC PO) Take 2 tablets by mouth daily.    . Melatonin 3 MG TABS Take 1 tablet by mouth at bedtime as needed.    . Methylcobalamin (B-12) 5000 MCG TBDP Take 1 tablet by mouth daily.    . Misc Natural Products (OSTEO BI-FLEX ADV TRIPLE ST PO) Take 1 tablet by mouth 2 (two) times daily.    . Misc Natural Products (PROSTATE SUPPORT PO) Take 1 capsule by mouth daily.    . Multiple Vitamin (MULTIVITAMIN) tablet Take 1 tablet by mouth daily.    . Multiple Vitamins-Minerals (MENS MULTIVITAMIN PLUS) TABS Take 1 tablet by mouth daily after breakfast.    . saw palmetto 80 MG capsule Take 80 mg by mouth daily.    . vitamin A 10000 UNIT capsule Take 40,000 Units by mouth daily.    . Vitamin D, Ergocalciferol, (DRISDOL) 50000 UNITS CAPS capsule Take 50,000 Units by mouth daily.    Marland Kitchen VITAMIN D-VITAMIN K PO Take 2 tablets by mouth daily.    . Zeaxanthin POWD Take 4 mg by mouth daily.    Marland Kitchen DHEA 25 MG CAPS Take 1 capsule by mouth daily.    . Flaxseed, Linseed, (FLAXSEED OIL) 1000 MG CAPS Take 1 capsule by mouth daily.    Marland Kitchen  fluconazole (DIFLUCAN) 150 MG tablet Take one tablet by mouth on Day 1. Repeat dose 2nd tablet on Day 3. (Patient not taking: Reported on 03/03/2016) 2 tablet 0  . gabapentin (NEURONTIN) 100 MG capsule TAKE 1 CAPSULE(100 MG) BY MOUTH AT BEDTIME (Patient not taking: Reported on 03/03/2016) 90 capsule 3  . lidocaine (XYLOCAINE) 2 % jelly Reported on 12/11/2015  0  . nitroGLYCERIN (NITRO-DUR) 0.1 mg/hr patch Cut patch into quarters and apply one quarter patch toaffected area. Remove and repeat daily (Patient not taking: Reported on 03/03/2016) 30 patch 12  . tamsulosin (FLOMAX) 0.4 MG CAPS capsule   3   No current facility-administered medications for this visit.     Objective:   BP (!) 150/84   Pulse 89   Temp 98.3 F (36.8 C)  (Oral)   Ht 6' (1.829 m)   Wt 165 lb (74.8 kg)   SpO2 98%   BMI 22.38 kg/m  Vitals and nursing note reviewed.  General: well nourished, well developed, in no acute distress with non-toxic appearance HEENT: normocephalic, atraumatic, moist mucous membranes Neck: supple, non-tender without lymphadenopathy CV: regular rate and rhythm without murmurs, rubs, or gallops Lungs: clear to auscultation bilaterally with normal work of breathing Abdomen: soft, non-tender, non-distended, no masses or organomegaly palpable, normoactive bowel sounds Skin: warm, dry, no rashes, cap refill < 2 seconds Extremities: warm and well perfused with string pedal pulse bilaterally, normal tone with no decreased warmth at toes or cyanosis, right big toe tail separating with thickening tissue and dry blood       Assessment & Plan:   Diabetes mellitus type 2, controlled ([place]) Controlled without prescribed medications. A1C 5.9 in office. Asymptomatic for diabetes. Due for eye exam. - Will continue to monitor glucose control depsite no medications - Encouraged patient to continue avoiding excessive carb intake - Patient needs to increase exercise  Fungal infection right foot New onset right big toe fungal infection. OTC antifungal w/o effect. Appears to be more progressive than stated by patient's history. Due for podiatry exam. Strong pulses bilaterally without change in warmth reassuring for concerns of arterial insufficiency. - Discussed need for patient to call and follow up with podiatry office this week for examination of the toe as this seems to be concerning for loss of the toe nail - Patient is to return in 1 month for recheck of the toe and to go over exhaustive herbal and supplemental OTC medications - Encouraged patient to keep foot dry to avoid bacterial infection  Orders Placed This Encounter  Procedures  . Flu Vaccine QUAD 36+ mos IM  . POCT glycosylated hemoglobin (Hb A1C)   No orders of the  defined types were placed in this encounter.   Richard Moses, [place], PGY-1 03/03/2016 7:05 PM

## 2016-03-03 NOTE — Assessment & Plan Note (Signed)
New onset right big toe fungal infection. OTC antifungal w/o effect. Appears to be more progressive than stated by patient's history. Due for podiatry exam. Strong pulses bilaterally without change in warmth reassuring for concerns of arterial insufficiency. - Discussed need for patient to call and follow up with podiatry office this week for examination of the toe as this seems to be concerning for loss of the toe nail - Patient is to return in 1 month for recheck of the toe and to go over exhaustive herbal and supplemental OTC medications - Encouraged patient to keep foot dry to avoid bacterial infection

## 2016-03-03 NOTE — Assessment & Plan Note (Addendum)
Controlled without prescribed medications. A1C 5.9 in office. Asymptomatic for diabetes. Due for eye exam. - Will continue to monitor glucose control depsite no medications - Encouraged patient to continue avoiding excessive carb intake - Patient needs to increase exercise

## 2016-03-03 NOTE — Patient Instructions (Signed)
It was a pleasure to meet you today. Please see below to review our plan for today's visit.  1. Please call your podiatrist and schedule an appointment in 1 week 2. Bring your medications when you come back in 1 month so we can accurately go over your medications. Please consider the effects of taking these medications as they could damage your kidneys 3. Your A1C is well controlled at 5.9 today. Continue your diet choices and try to exercise if you can!  Please call the clinic at [phone] if your symptoms worsen or you have any concerns. It was my pleasure to see you. -- Harriet [place], [place], PGY-1

## 2016-04-02 ENCOUNTER — Ambulatory Visit (INDEPENDENT_AMBULATORY_CARE_PROVIDER_SITE_OTHER): Payer: Medicare HMO | Admitting: Family Medicine

## 2016-04-02 ENCOUNTER — Encounter: Payer: Self-pay | Admitting: Family Medicine

## 2016-04-02 DIAGNOSIS — I10 Essential (primary) hypertension: Secondary | ICD-10-CM

## 2016-04-02 DIAGNOSIS — E1122 Type 2 diabetes mellitus with diabetic chronic kidney disease: Secondary | ICD-10-CM

## 2016-04-02 DIAGNOSIS — B353 Tinea pedis: Secondary | ICD-10-CM | POA: Diagnosis not present

## 2016-04-02 DIAGNOSIS — N183 Chronic kidney disease, stage 3 (moderate): Secondary | ICD-10-CM

## 2016-04-02 MED ORDER — ZOSTER VACCINE LIVE 19400 UNT/0.65ML ~~LOC~~ SUSR: 0.6500 mL | Freq: Once | SUBCUTANEOUS | 0 refills | Status: AC

## 2016-04-02 NOTE — Assessment & Plan Note (Addendum)
Controlled. Last A1c 5.9 on 03/03/16. Asymptomatic at present. UTD on foot and eye exam. Needs zosters vaccine. Avoiding some carb rich food but does not exercise due to foot.  - Zostavax Rx given to patient to fill at pharmacy - Patient told to avoid excessive carb intake - Encouraged to exercise 150 mins moderate to high intensity per week

## 2016-04-02 NOTE — Assessment & Plan Note (Addendum)
Improved. S/p nail avulsion by podiatrist. Healing scab w/o bleeding or signs of infection. Asymptomatic per patient. - Continue keeping feet dry to prevent infection - See podiatry fo f/u PRN

## 2016-04-02 NOTE — Progress Notes (Signed)
Subjective:   Patient ID: Richard Moses    DOB: 1932-12-08, 82 y.o. male   MRN: [id number]  CC: Right Big Toe Pain  HPI: Richard Moses is a 82 y.o. male who presents to clinic today for f/u concerning right big toe fungal infection. Problems discussed today are as follows:  1. Right Big Toe Fungal Infection: skin breakdown and bleeding 1 month ago but resolved since nail was removed by podiatrist 2 weeks ago without complications. Denies fevers, chills, redness, pain at toe, nausea, vomiting.  2. [place]: last A1C of 5.9. Patient is not taking prescribed diabetes medications, only OTC herbal remedies and supplements. Says he has been watching what he eats and avoiding excessive sugars. Does not exercise because of his right big toe. Denies symptoms of polyuria, polydipsia, polyphagia, paresthesia, nausea or vomiting.  3. Hypertension: says he does not need meds to control his BP because they have "unatural substances." Patient acknowledges he has chronic kidney disease but not concerned about his herbal supplements.  ROS: Complete ROS performed, see HPI for pertinent ROS.  [place]: Pertinent past medical, surgical, family, and social history were reviewed and updated as appropriate. Smoking status reviewed.  Medications reviewed. Current Outpatient Prescriptions  Medication Sig Dispense Refill  . aspirin EC 81 MG tablet Take 81 mg by mouth daily.    Richard Moses (BERBERINE COMPLEX PO) Take 500 mg by mouth 3 (three) times daily with meals. Reported on 12/11/2015    . ferrous sulfate 324 (65 Fe) MG TBEC Take by mouth.    . Multiple Vitamins-Minerals (MENS MULTIVITAMIN PLUS) TABS Take 1 tablet by mouth daily after breakfast.    . Zoster Vaccine Live, PF, (ZOSTAVAX) 08657 UNT/0.65ML injection Inject 19,400 Units into the skin once. 1 each 0   No current facility-administered medications for this visit.     Objective:   BP (!) 155/84   Pulse 100   Temp 98.1 F (36.7  C) (Oral)   Wt 164 lb (74.4 kg)   SpO2 99%   BMI 22.24 kg/m  Vitals and nursing note reviewed.  General: well nourished, well developed, in no acute distress with non-toxic appearance HEENT: normocephalic, atraumatic, moist mucous membranes Neck: supple, non-tender without lymphadenopathy CV: regular rate and rhythm without murmurs, rubs, or gallops Lungs: clear to auscultation bilaterally with normal work of breathing Abdomen: soft, non-tender, non-distended, no masses or organomegaly palpable, normoactive bowel sounds Skin: warm, dry, no rashes or lesions, cap refill < 2 seconds Extremities: warm and well perfused, normal tone, right big toe with dry scab s/p nail avulsion without purulence or hemorrhaging    Assessment & Plan:   Diabetes mellitus type 2, controlled ([place]) Controlled. Last A1c 5.9 on 03/03/16. Asymptomatic at present. UTD on foot and eye exam. Needs zosters vaccine. Avoiding some carb rich food but does not exercise due to foot.  - Zostavax Rx given to patient to fill at pharmacy - Patient told to avoid excessive carb intake - Encouraged to exercise 150 mins moderate to high intensity per week  Essential hypertension Uncontrolled. BP elevated at 155/84. Not on antihypertensives and refusing to at present. Does have stage 3 CKD w/ last creatinine 1.9 four months ago. - Discouraged patient from taking herbal and supplements OTC as this could further damage kidneys - Does not seem to want to take medications to control BP at this time  Fungal infection right foot Improved. S/p nail avulsion by podiatrist. Healing scab w/o bleeding or signs of infection. Asymptomatic  per patient. - Continue keeping feet dry to prevent infection - See podiatry fo f/u PRN  No orders of the defined types were placed in this encounter.  Meds ordered this encounter  Medications  . aspirin EC 81 MG tablet    Sig: Take 81 mg by mouth daily.  . ferrous sulfate 324 (65 Fe) MG TBEC     Sig: Take by mouth.  . Zoster Vaccine Live, PF, (ZOSTAVAX) [id number] UNT/0.65ML injection    Sig: Inject 19,400 Units into the skin once.    Dispense:  1 each    Refill:  0    Richard Moses, [place], PGY-1 04/02/2016 2:28 PM

## 2016-04-02 NOTE — Patient Instructions (Signed)
It was a pleasure to meet you today. Please see below to review our plan for today's visit.  1. I have given you a script for the shingles vaccine. Please get this to prevent singles infection. 2. Your diabetes is controlled. We will recheck your A1c around December 3. Your right big toe looks improved. Please keep the toe dry and see podiatry if needed. 4. Take aspirin every day to help prevent heart disease. 5. We will see you on 04/23/16 for your physical.  Please call the clinic at [phone] if your symptoms worsen or you have any concerns. It was my pleasure to see you. -- Harriet Butte, [place], PGY-1

## 2016-04-02 NOTE — Assessment & Plan Note (Addendum)
Uncontrolled. BP elevated at 155/84. Not on antihypertensives and refusing to at present. Does have stage 3 CKD w/ last creatinine 1.9 four months ago. - Discouraged patient from taking herbal and supplements OTC as this could further damage kidneys - Does not seem to want to take medications to control BP at this time

## 2016-04-15 ENCOUNTER — Encounter: Payer: Medicare HMO | Admitting: Family Medicine

## 2016-04-21 ENCOUNTER — Ambulatory Visit (INDEPENDENT_AMBULATORY_CARE_PROVIDER_SITE_OTHER): Payer: Medicare HMO | Admitting: Podiatry

## 2016-04-21 ENCOUNTER — Ambulatory Visit (INDEPENDENT_AMBULATORY_CARE_PROVIDER_SITE_OTHER): Payer: Medicare HMO

## 2016-04-21 ENCOUNTER — Encounter: Payer: Self-pay | Admitting: Podiatry

## 2016-04-21 VITALS — BP 175/86 | HR 97 | Resp 16 | Ht 72.0 in | Wt 159.0 lb

## 2016-04-21 DIAGNOSIS — M79671 Pain in right foot: Secondary | ICD-10-CM

## 2016-04-21 DIAGNOSIS — L84 Corns and callosities: Secondary | ICD-10-CM | POA: Diagnosis not present

## 2016-04-21 DIAGNOSIS — M2041 Other hammer toe(s) (acquired), right foot: Secondary | ICD-10-CM | POA: Diagnosis not present

## 2016-04-21 NOTE — Progress Notes (Signed)
   Subjective:    Patient ID: Richard Moses, male    DOB: September 15, 1932, 82 y.o.   MRN: [id number]  HPI Chief Complaint  Patient presents with  . Foot Pain    Right foot; plantar forefoot; pt stated, "In 2014, injured 3rd toe; feels like walking on a marble"; x3 weeks  . Callouses    Right foot; 3rd toe-bottom of toe      Review of Systems  Constitutional: Positive for appetite change, fatigue and unexpected weight change.  HENT: Positive for hearing loss and tinnitus.   Eyes: Positive for visual disturbance.  Endocrine: Positive for polyuria.  Genitourinary: Positive for frequency and urgency.  Musculoskeletal: Positive for arthralgias and gait problem.  Neurological: Positive for weakness.  All other systems reviewed and are negative.      Objective:   Physical Exam        Assessment & Plan:

## 2016-04-22 NOTE — Progress Notes (Signed)
Subjective:     Patient ID: Richard Moses, male   DOB: [birth year], 82 y.o.   MRN: [id number]  HPI patient presents that the third digit right has become very painful and makes it hard for him to wear shoe gear comfortably. Does not remember specific injury but does think he may have jammed at one time   Review of Systems  All other systems reviewed and are negative.      Objective:   Physical Exam  Constitutional: He is oriented to person, place, and time.  Cardiovascular: Intact distal pulses.   Musculoskeletal: Normal range of motion.  Neurological: He is oriented to person, place, and time.  Skin: Skin is warm and dry.  Nursing note and vitals reviewed.  neurovascular status intact muscle strength adequate with distal keratotic lesion digit 3 right that's very painful when pressed with a deformity of the toe noted and patient's also noted to have mild edema in the ankles bilateral with varicosities noted. Patient's found have good digital perfusion and is well oriented 3     Assessment:     Severe distal keratotic lesion third right secondary to foot structure with hammertoe type deformity    Plan:     H&P conditions reviewed and explained the position of the toe and it's relationship to pain. Today debridement of lesion accomplished along with buttress padding and I instructed on wearing comfortable shoes and if symptoms recur we may have to do digital type procedure  X-ray did indicate there is structural malalignment of the forefoot right

## 2016-04-23 ENCOUNTER — Encounter: Payer: Self-pay | Admitting: Family Medicine

## 2016-04-23 ENCOUNTER — Ambulatory Visit (INDEPENDENT_AMBULATORY_CARE_PROVIDER_SITE_OTHER): Payer: Medicare HMO | Admitting: Family Medicine

## 2016-04-23 DIAGNOSIS — Z0000 Encounter for general adult medical examination without abnormal findings: Secondary | ICD-10-CM

## 2016-04-23 DIAGNOSIS — Z9114 Patient's other noncompliance with medication regimen: Secondary | ICD-10-CM

## 2016-04-23 DIAGNOSIS — Z7189 Other specified counseling: Secondary | ICD-10-CM | POA: Insufficient documentation

## 2016-04-23 NOTE — Progress Notes (Signed)
   Subjective:   Patient ID: Richard Moses    DOB: [birth year], 82 y.o. male   MRN: [id number]  CC: "Annual wellness visit"  HPI: Richard Moses is a 82 y.o. male who presents to clinic today for physical examination and evaluation. Problems discussed today are as follows:  Annual Wellness Exam: Patient presents for wellness visit for insurance. Says he feels well without complaints. Patient is very careful about what he eats. Says he avoids fatty and sugar rich foods. Eats vegetables regularly. Has a friend who cooks for him at home regularly. Exercises by strength resistance training and walking daily. Says he does not like taking "unnatural medications." Does take aspirin daily and iron supplement. Does not smoke.  ROS: Complete ROS performed, see HPI for pertinent ROS.  [place]: Pertinent past medical, surgical, family, and social history were reviewed and updated as appropriate. Smoking status reviewed.  Medications reviewed. Current Outpatient Prescriptions  Medication Sig Dispense Refill  . aspirin EC 81 MG tablet Take 81 mg by mouth daily.    Jolyne Loa Grape-Goldenseal (BERBERINE COMPLEX PO) Take 500 mg by mouth 3 (three) times daily with meals. Reported on 12/11/2015    . ferrous sulfate 324 (65 Fe) MG TBEC Take by mouth.    . Multiple Vitamins-Minerals (MENS MULTIVITAMIN PLUS) TABS Take 1 tablet by mouth daily after breakfast.     No current facility-administered medications for this visit.     Objective:   BP (!) 159/86 (BP Location: Right Arm, Patient Position: Sitting, Cuff Size: Normal)   Pulse 83   Temp 97.5 F (36.4 C) (Oral)   Ht 6' (1.829 m)   Wt 159 lb 9.6 oz (72.4 kg)   BMI 21.65 kg/m  Vitals and nursing note reviewed.  General: well nourished, well developed, in no acute distress with non-toxic appearance HEENT: normocephalic, atraumatic, moist mucous membranes Neck: supple, non-tender without lymphadenopathy CV: regular rate and rhythm without murmurs,  rubs, or gallops, no lower extremity edema Lungs: clear to auscultation bilaterally with normal work of breathing Abdomen: soft, non-tender, non-distended, no masses or organomegaly palpable, normoactive bowel sounds Skin: warm, dry, no rashes or lesions, cap refill < 2 seconds Extremities: warm and well perfused, normal tone  Assessment & Plan:   Encounter for annual physical exam No complaints today. Annual wellness. BP acceptable without meds. Diabetes A1c 6.8 11/2015 but pt declined medication treatment.  --Continue ASA 81 mg daily. --Continue iron supplement daily. --Continue exercise with walking and strength resistance training daily. --Avoid excessive carbs and maintain adequate vegetable intake. --Patient given Zostavax Rx but cannot afford 300 payment due to insurance. --F/u in 6 months.  No orders of the defined types were placed in this encounter.  No orders of the defined types were placed in this encounter.   Richard Moses, [place], PGY-1 04/23/2016 5:50 PM

## 2016-04-23 NOTE — Assessment & Plan Note (Deleted)
A 

## 2016-04-23 NOTE — Assessment & Plan Note (Addendum)
No complaints today. Annual wellness. BP acceptable without meds. Diabetes A1c 6.8 11/2015 but pt declined medication treatment.  --Continue ASA 81 mg daily. --Continue iron supplement daily. --Continue exercise with walking and strength resistance training daily. --Avoid excessive carbs and maintain adequate vegetable intake. --Patient given Zostavax Rx but cannot afford 300 payment due to insurance. --F/u in 6 months.

## 2016-04-23 NOTE — Patient Instructions (Signed)
It was a pleasure to meet you today. Please see below to review our plan for today's visit.  1. You are doing well with your eating and exercising habits. 2. Your blood pressure looks appropriate and I do not see a need for medication. 3. Please return in 6 months or sooner if needed.  Please call the clinic at [phone] if your symptoms worsen or you have any concerns. It was my pleasure to see you. -- Harriet Butte, [place], PGY-1

## 2016-05-29 ENCOUNTER — Encounter (HOSPITAL_COMMUNITY): Payer: Self-pay | Admitting: Emergency Medicine

## 2016-05-29 ENCOUNTER — Emergency Department (HOSPITAL_COMMUNITY): Payer: Medicare HMO

## 2016-05-29 ENCOUNTER — Emergency Department (HOSPITAL_COMMUNITY)
Admission: EM | Admit: 2016-05-29 | Discharge: 2016-05-29 | Disposition: A | Discharge status: Home / Self Care | Payer: Medicare HMO | Attending: Emergency Medicine | Admitting: Emergency Medicine

## 2016-05-29 DIAGNOSIS — I10 Essential (primary) hypertension: Secondary | ICD-10-CM | POA: Diagnosis not present

## 2016-05-29 DIAGNOSIS — Z87891 Personal history of nicotine dependence: Secondary | ICD-10-CM | POA: Insufficient documentation

## 2016-05-29 DIAGNOSIS — M25511 Pain in right shoulder: Secondary | ICD-10-CM | POA: Diagnosis present

## 2016-05-29 DIAGNOSIS — E039 Hypothyroidism, unspecified: Secondary | ICD-10-CM | POA: Insufficient documentation

## 2016-05-29 DIAGNOSIS — Z7982 Long term (current) use of aspirin: Secondary | ICD-10-CM | POA: Diagnosis not present

## 2016-05-29 DIAGNOSIS — Z79899 Other long term (current) drug therapy: Secondary | ICD-10-CM | POA: Diagnosis not present

## 2016-05-29 DIAGNOSIS — Z5321 Procedure and treatment not carried out due to patient leaving prior to being seen by health care provider: Secondary | ICD-10-CM | POA: Diagnosis not present

## 2016-05-29 DIAGNOSIS — E119 Type 2 diabetes mellitus without complications: Secondary | ICD-10-CM | POA: Diagnosis not present

## 2016-05-29 NOTE — ED Notes (Signed)
Patient called for room, no answer. 

## 2016-05-29 NOTE — ED Triage Notes (Signed)
Pt states "I have a problem with my left shoulder and its moved up to the left side of my neck for 4-5 days." pt states he fell in 2014 and it messed up his rotator cuff in both shoulders. Pt states 4 days ago he noticed light pain in his right shoulder, states it hurts to raise his right arm. Pain travels up to his neck and to his right collar bone. Pt in nad.

## 2016-06-02 ENCOUNTER — Encounter: Payer: Self-pay | Admitting: Family Medicine

## 2016-06-03 ENCOUNTER — Telehealth: Payer: Self-pay | Admitting: Family Medicine

## 2016-06-03 NOTE — Telephone Encounter (Signed)
Pt called because he was seen in the ER on the 23rd. They did x-rays and told him that his PCP will call him with the results. He has not heard anything as of yet. jw

## 2016-06-04 ENCOUNTER — Ambulatory Visit (INDEPENDENT_AMBULATORY_CARE_PROVIDER_SITE_OTHER): Payer: Medicare HMO | Admitting: Family Medicine

## 2016-06-04 ENCOUNTER — Other Ambulatory Visit: Payer: Self-pay | Admitting: Family Medicine

## 2016-06-04 ENCOUNTER — Encounter: Payer: Self-pay | Admitting: Family Medicine

## 2016-06-04 ENCOUNTER — Telehealth: Payer: Self-pay | Admitting: Family Medicine

## 2016-06-04 VITALS — BP 124/70 | HR 79 | Temp 98.0°F | Ht 71.0 in | Wt 150.0 lb

## 2016-06-04 DIAGNOSIS — E46 Unspecified protein-calorie malnutrition: Secondary | ICD-10-CM | POA: Insufficient documentation

## 2016-06-04 DIAGNOSIS — E43 Unspecified severe protein-calorie malnutrition: Secondary | ICD-10-CM | POA: Insufficient documentation

## 2016-06-04 DIAGNOSIS — R634 Abnormal weight loss: Secondary | ICD-10-CM | POA: Diagnosis not present

## 2016-06-04 DIAGNOSIS — M25511 Pain in right shoulder: Secondary | ICD-10-CM | POA: Diagnosis not present

## 2016-06-04 DIAGNOSIS — M7989 Other specified soft tissue disorders: Secondary | ICD-10-CM | POA: Insufficient documentation

## 2016-06-04 DIAGNOSIS — M799 Soft tissue disorder, unspecified: Secondary | ICD-10-CM

## 2016-06-04 DIAGNOSIS — M542 Cervicalgia: Secondary | ICD-10-CM | POA: Diagnosis not present

## 2016-06-04 LAB — CBC
HCT: 29.3 % — ABNORMAL LOW (ref 38.5–50.0)
Hemoglobin: 9.4 g/dL — ABNORMAL LOW (ref 13.2–17.1)
MCH: 31.6 pg (ref 27.0–33.0)
MCHC: 32.1 g/dL (ref 32.0–36.0)
MCV: 98.7 fL (ref 80.0–100.0)
MPV: 9.4 fL (ref 7.5–12.5)
PLATELETS: 430 10*3/uL — AB (ref 140–400)
RBC: 2.97 MIL/uL — ABNORMAL LOW (ref 4.20–5.80)
RDW: 13 % (ref 11.0–15.0)
WBC: 13.1 10*3/uL — AB (ref 3.8–10.8)

## 2016-06-04 LAB — POCT UA - MICROSCOPIC ONLY: WBC, Ur, HPF, POC: >20

## 2016-06-04 LAB — POCT URINALYSIS DIPSTICK
Bilirubin, UA: NEGATIVE
Glucose, UA: NEGATIVE
Ketones, UA: NEGATIVE
NITRITE UA: NEGATIVE
PH UA: 6.5
Protein, UA: 100
Spec Grav, UA: 1.01
UROBILINOGEN UA: 0.2

## 2016-06-04 LAB — BASIC METABOLIC PANEL WITH GFR
BUN: 76 mg/dL — ABNORMAL HIGH (ref 7–25)
CALCIUM: 8.8 mg/dL (ref 8.6–10.3)
CO2: 17 mmol/L — AB (ref 20–31)
Chloride: 103 mmol/L (ref 98–110)
Creat: 4.4 mg/dL — ABNORMAL HIGH (ref 0.70–1.11)
GFR, EST AFRICAN AMERICAN: 14 mL/min — AB (ref >=60)
GFR, EST NON AFRICAN AMERICAN: 12 mL/min — AB (ref >=60)
Glucose, Bld: 153 mg/dL — ABNORMAL HIGH (ref 65–99)
Potassium: 3.9 mmol/L (ref 3.5–5.3)
Sodium: 137 mmol/L (ref 135–146)

## 2016-06-04 MED ORDER — BACLOFEN 10 MG PO TABS: 5.0000 mg | ORAL_TABLET | Freq: Two times a day (BID) | ORAL | 0 refills | Status: DC | PRN

## 2016-06-04 NOTE — Progress Notes (Signed)
HPI  CC: right shoulder pain Patient is here with complaints of right-sided shoulder pain. He had presented to the ED on 12/23 for similar complaint. At that time x-rays were obtained and were negative for any fracture or dislocation. Patient states that approximately 9 days ago he had some pain in the right shoulder that he had appreciated prior to bed. The following morning he woke up and he was in significant pain on the right side. Pain seems to be most severe at the base of his neck and proximal posterior shoulder. He endorses increased discomfort with overhead movements of the shoulder and severe ROM of the neck. Patient also endorses some headaches that he believes is "from the pain in my neck". Pain has not progressed since onset but it has not improved either. He denies any rash development or radiation of pain. Patient also endorses a painful soft tissue mass just proximal to his mediastinum that he first noticed about the same time as the onset of his shoulder and neck pain.  History patient states that he had fallen and injured both shoulders back in 2014. At that time he was told that he injured/tore both rotator cuffs. He denies any surgical intervention at that time. He denies any other injuries or trauma to either shoulder.  Patient denies any fever, chills, numbness, paresthesias, dizziness, confusion, vision changes, shortness breath, chest pain, nausea, vomiting, or diarrhea. Patient endorses recent fatigue, weight loss, weakness of the affected shoulder (he thinks is likely due to pain).  Of Note: After personal review of right upper extremity x-rays taken in the ED there is a small cystlike mid-diaphyseal bony lesion best seen on the axillary view.  Review of Systems    See HPI for ROS. All other systems reviewed and are negative.  CC, SH/smoking status, and VS noted  Objective: BP 124/70   Pulse 79   Temp 98 F (36.7 C) (Oral)   Ht '5\' 11"'  (1.803 m)   Wt 150 lb (68 kg)    SpO2 96%   BMI 20.92 kg/m  Gen: NAD, alert, cooperative, thin and pleasant elderly caucasian male. Neck: 2 cm diameter soft tissue mass immediately superior to the mediastinum noted. Lesion is significantly tender to palpation. No evidence of erythema or induration. No ulceration, pustule, or vesicles noted. CV: Well-perfused, good capillary refill Resp: non-labored Neuro: Alert and oriented, Speech clear, No gross deficits Right Shoulder: negative evidence of bony deformity. Some evidence of scapular asymmetry (?muscle atrophy) bilaterally; significant TTP along the right cervical paraspinal muscles, and trapezius muscle body; negative for tenderness over long head of biceps (bicipital groove). positive for [facility] Joint tenderness. Limited active and passive range of motion (120 flex Huel Cote /130Abd /90ER /70IR). Moderate scapular winging observed of right shoulder compared to left. Sensation intact. Peripheral pulses intact. Crossarm test positive. Empty can positive. Hawkins, Neer, and Obrien's difficult to perform secondary to limited ROM. Yergason's/Speeds test negative.   Assessment and plan:  Acute pain of right shoulder Patient is here with new onset of right shoulder pain. Etiology currently unknown however likely multifactorial in nature. Physical exam yielded evidence consistent with chronic rotator cuff pathology/tear. Scapular winging and muscle atrophy is consistent with this diagnosis. Patient may also have some initial onset of adhesive capsulitis as his active and passive range of motion was significantly limited. There is also concern the patient had approximately 15 pounds weight loss over the past 6 months. A humeral diaphyseal bony lesion was noted on recent plain x-ray.  This bony lesion as well as the weight loss is concerning for possible newly diagnosed metastasis versus multiple myeloma, however simple bony cyst is more likely. Patient also had a new painful soft tissue  lesion immediately superior to his mediastinum. He first noticed this with the onset of shoulder pain. With all of patient's symptoms there is concern that this may be additional evidence in favor of a metastatic diagnosis. - CT neck and chest - X-ray chest and right humerus - UA ordered (previous UA showed increasing proteinuria) - BMP and CBC ordered - Patient has to follow-up 2-5 days after obtaining additional imaging. - Baclofen 5 mg twice a day when necessary for muscle spasms of the neck - Tylenol as needed for pain - ROM exercises for shoulder - Encouraged icing    Orders Placed This Encounter  Procedures  . Urine culture  . CT SOFT TISSUE NECK WO CONTRAST    Standing Status:   Future    Standing Expiration Date:   09/02/2017    Order Specific Question:   Reason for Exam (SYMPTOM  OR DIAGNOSIS REQUIRED)    Answer:   Sternoclavicular soft tissue mass    Order Specific Question:   Preferred imaging location?    Answer:   [facility]  . CT CHEST WO CONTRAST    150 lbs /no needs / [employer] medicare/ asha / mdc /    Standing Status:   Future    Standing Expiration Date:   08/04/2017    Order Specific Question:   Reason for Exam (SYMPTOM  OR DIAGNOSIS REQUIRED)    Answer:   Weight loss, neck mass, and possible bony lesion on humerus.    Order Specific Question:   Preferred imaging location?    Answer:   [facility]  . DG Chest 2 View    Standing Status:   Future    Standing Expiration Date:   08/04/2017    Order Specific Question:   Reason for Exam (SYMPTOM  OR DIAGNOSIS REQUIRED)    Answer:   possible bony lesion on humerus    Order Specific Question:   Preferred imaging location?    Answer:   [facility]  . DG Humerus Right    Standing Status:   Future    Standing Expiration Date:   08/04/2017    Order Specific Question:   Reason for Exam (SYMPTOM  OR DIAGNOSIS REQUIRED)    Answer:   possible bony lesion on humerus    Order Specific  Question:   Preferred imaging location?    Answer:   [facility]  . BASIC METABOLIC PANEL WITH GFR  . CBC  . POCT urinalysis dipstick  . POCT UA - Microscopic Only    Meds ordered this encounter  Medications  . baclofen (LIORESAL) 10 MG tablet    Sig: Take 0.5 tablets (5 mg total) by mouth 2 (two) times daily as needed for muscle spasms.    Dispense:  30 each    Refill:  0     Elberta Leatherwood, MD,MS,  PGY3 06/04/2016 5:23 PM

## 2016-06-04 NOTE — Assessment & Plan Note (Signed)
Patient is here with new onset of right shoulder pain. Etiology currently unknown however likely multifactorial in nature. Physical exam yielded evidence consistent with chronic rotator cuff pathology/tear. Scapular winging and muscle atrophy is consistent with this diagnosis. Patient may also have some initial onset of adhesive capsulitis as his active and passive range of motion was significantly limited. There is also concern the patient had approximately 15 pounds weight loss over the past 6 months. A humeral diaphyseal bony lesion was noted on recent plain x-ray. This bony lesion as well as the weight loss is concerning for possible newly diagnosed metastasis versus multiple myeloma, however simple bony cyst is more likely. Patient also had a new painful soft tissue lesion immediately superior to his mediastinum. He first noticed this with the onset of shoulder pain. With all of patient's symptoms there is concern that this may be additional evidence in favor of a metastatic diagnosis. - CT neck and chest - X-ray chest and right humerus - UA ordered (previous UA showed increasing proteinuria) - BMP and CBC ordered - Patient has to follow-up 2-5 days after obtaining additional imaging. - Baclofen 5 mg twice a day when necessary for muscle spasms of the neck - Tylenol as needed for pain - ROM exercises for shoulder - Encouraged icing

## 2016-06-04 NOTE — Telephone Encounter (Signed)
Discussed recent R-shoulder xray results on 05/29/16. Patient was seen earlier today at clinic by Dr. Alease Frame and reviewed the images. Patient states he is to get an MRI for better visualization of soft tissue. Patient was instructed to call the clinic if he has further questions. -- Harriet Butte, [place], PGY-1

## 2016-06-04 NOTE — Patient Instructions (Signed)
It was a pleasure seeing you today in our clinic. Today we discussed your right shoulder pain. Here is the treatment plan we have discussed and agreed upon together:   - I would like to get some additional imaging of your right shoulder and chest. I've placed this order and my nursing staff will schedule this before you leave today. - I have prescribed you a medication called baclofen. Take one half tablet up to 2 times a day as needed for the muscle spasms in your neck and shoulder. Do not exceed 2 full tablets a day. - Continue taking Tylenol as needed for pain. - Icing this region may help. - Try to keep this shoulder active. If you do not use this shoulder over the next few weeks my fear is that he may have issues with your range of motion in the future. - I would like to have you follow-up in our clinic to discuss the results of your labs and imaging studies next week. Please allow at least 2 full days to pass after getting the imaging studies before you are seen in the clinic as it may take some additional time to get these results.

## 2016-06-06 LAB — URINE CULTURE: Organism ID, Bacteria: NO GROWTH

## 2016-06-07 HISTORY — PX: OTHER SURGICAL HISTORY: SHX169

## 2016-06-08 ENCOUNTER — Telehealth: Payer: Self-pay | Admitting: Family Medicine

## 2016-06-08 DIAGNOSIS — R319 Hematuria, unspecified: Secondary | ICD-10-CM

## 2016-06-08 NOTE — Telephone Encounter (Signed)
Called patient to discuss recent lab results on 06/04/2016. Have concerns for gradual elevations in hematuria and proteinuria over the past year. Creatinine has also spiked since last visit 6 months ago (1.90>4.40). Xray for right shoulder pain negative for fx or dislocation but there was a cyst identified. These constellation of findings are concerning for multiple myeloma. Patient denies having issues with urination and back pain, or fatigue. Recommended patient be either directly admitted or go to the ED for evaluation but patient was resistant. Told patient to call clinic tomorrow first thing (closed 06/09/2015) given no openings for tomorrow for a walk-in visit. Patient was urgently referred to Nephrology for further evaluation. Patient was in agreement and had no further questions. -- Harriet Butte, [place], PGY-1

## 2016-06-09 ENCOUNTER — Other Ambulatory Visit: Payer: Medicare HMO

## 2016-06-22 ENCOUNTER — Telehealth: Payer: Self-pay | Admitting: *Deleted

## 2016-06-22 DIAGNOSIS — D509 Iron deficiency anemia, unspecified: Secondary | ICD-10-CM | POA: Diagnosis not present

## 2016-06-22 DIAGNOSIS — R69 Illness, unspecified: Secondary | ICD-10-CM | POA: Diagnosis not present

## 2016-06-22 DIAGNOSIS — M7581 Other shoulder lesions, right shoulder: Secondary | ICD-10-CM | POA: Diagnosis not present

## 2016-06-22 DIAGNOSIS — R1915 Other abnormal bowel sounds: Secondary | ICD-10-CM | POA: Diagnosis not present

## 2016-06-22 DIAGNOSIS — M170 Bilateral primary osteoarthritis of knee: Secondary | ICD-10-CM | POA: Diagnosis not present

## 2016-06-22 DIAGNOSIS — Z0000 Encounter for general adult medical examination without abnormal findings: Secondary | ICD-10-CM | POA: Diagnosis not present

## 2016-06-22 DIAGNOSIS — G4700 Insomnia, unspecified: Secondary | ICD-10-CM | POA: Diagnosis not present

## 2016-06-22 DIAGNOSIS — N401 Enlarged prostate with lower urinary tract symptoms: Secondary | ICD-10-CM | POA: Diagnosis not present

## 2016-06-22 DIAGNOSIS — M542 Cervicalgia: Secondary | ICD-10-CM | POA: Diagnosis not present

## 2016-06-22 DIAGNOSIS — H9113 Presbycusis, bilateral: Secondary | ICD-10-CM | POA: Diagnosis not present

## 2016-06-22 NOTE — Telephone Encounter (Signed)
Nurse practitioner from [facility] called to report these findings for pt who has appt with you next week. pcp not available.  Swollen lyph nodes, weight loss, BMI =19, check tsh, nodles in neck, pulse of 92 (resting) 1+ edema in ankles. Deseree Kennon Holter, CMA

## 2016-07-01 ENCOUNTER — Encounter: Payer: Self-pay | Admitting: Internal Medicine

## 2016-07-01 ENCOUNTER — Ambulatory Visit (INDEPENDENT_AMBULATORY_CARE_PROVIDER_SITE_OTHER): Payer: Medicare HMO | Admitting: Internal Medicine

## 2016-07-01 VITALS — BP 148/80 | HR 93 | Temp 97.9°F | Ht 71.0 in | Wt 149.0 lb

## 2016-07-01 DIAGNOSIS — R7989 Other specified abnormal findings of blood chemistry: Secondary | ICD-10-CM | POA: Diagnosis not present

## 2016-07-01 DIAGNOSIS — N183 Chronic kidney disease, stage 3 (moderate): Secondary | ICD-10-CM | POA: Diagnosis not present

## 2016-07-01 DIAGNOSIS — D649 Anemia, unspecified: Secondary | ICD-10-CM | POA: Diagnosis not present

## 2016-07-01 DIAGNOSIS — R21 Rash and other nonspecific skin eruption: Secondary | ICD-10-CM | POA: Insufficient documentation

## 2016-07-01 DIAGNOSIS — E1122 Type 2 diabetes mellitus with diabetic chronic kidney disease: Secondary | ICD-10-CM

## 2016-07-01 DIAGNOSIS — R634 Abnormal weight loss: Secondary | ICD-10-CM | POA: Diagnosis not present

## 2016-07-01 LAB — COMPLETE METABOLIC PANEL WITH GFR
ALBUMIN: 3.6 g/dL (ref 3.6–5.1)
ALK PHOS: 117 U/L — AB (ref 40–115)
ALT: 21 U/L (ref 9–46)
AST: 24 U/L (ref 10–35)
BUN: 64 mg/dL — AB (ref 7–25)
CALCIUM: 9.1 mg/dL (ref 8.6–10.3)
CHLORIDE: 111 mmol/L — AB (ref 98–110)
CO2: 18 mmol/L — ABNORMAL LOW (ref 20–31)
Creat: 3.79 mg/dL — ABNORMAL HIGH (ref 0.70–1.11)
GFR, Est African American: 16 mL/min — ABNORMAL LOW (ref >=60)
GFR, Est Non African American: 14 mL/min — ABNORMAL LOW (ref >=60)
Glucose, Bld: 116 mg/dL — ABNORMAL HIGH (ref 65–99)
Potassium: 4 mmol/L (ref 3.5–5.3)
Sodium: 141 mmol/L (ref 135–146)
Total Bilirubin: 0.4 mg/dL (ref 0.2–1.2)
Total Protein: 7.3 g/dL (ref 6.1–8.1)

## 2016-07-01 LAB — CBC WITH DIFFERENTIAL/PLATELET
BASOS ABS: 0 {cells}/uL (ref 0–200)
Basophils Relative: 0 %
Eosinophils Absolute: 154 cells/uL (ref 15–500)
Eosinophils Relative: 2 %
HEMATOCRIT: 27.1 % — AB (ref 38.5–50.0)
Hemoglobin: 8.6 g/dL — ABNORMAL LOW (ref 13.2–17.1)
LYMPHS PCT: 22 %
Lymphs Abs: 1694 cells/uL (ref 850–3900)
MCH: 30.7 pg (ref 27.0–33.0)
MCHC: 31.7 g/dL — AB (ref 32.0–36.0)
MCV: 96.8 fL (ref 80.0–100.0)
MONO ABS: 847 {cells}/uL (ref 200–950)
MPV: 9.5 fL (ref 7.5–12.5)
Monocytes Relative: 11 %
Neutro Abs: 5005 cells/uL (ref 1500–7800)
Neutrophils Relative %: 65 %
Platelets: 310 10*3/uL (ref 140–400)
RBC: 2.8 MIL/uL — AB (ref 4.20–5.80)
RDW: 13.7 % (ref 11.0–15.0)
WBC: 7.7 10*3/uL (ref 3.8–10.8)

## 2016-07-01 LAB — POCT GLYCOSYLATED HEMOGLOBIN (HGB A1C): Hemoglobin A1C: 6.2

## 2016-07-01 NOTE — Patient Instructions (Addendum)
It was nice meeting you today Richard Moses!  Please be sure to go to your CT scan appointments. It is also important to call the nephrologist office back as soon as possible to schedule this appointment.   We will see you back in clinic in two weeks to discuss your results.   If you have any questions or concerns, please feel free to call the clinic.   Be well,  Dr. Avon Gully

## 2016-07-01 NOTE — Progress Notes (Signed)
Subjective:   Patient: Richard Moses       Birthdate: 23-Feb-1933       MRN: [id number]      HPI  Richard Moses is a 83 y.o. male presenting for rash and f/u of prior labs/imaging.   Patient was seen in December for R shoulder pain. Xrays taken at that time showed a cyst that was concerning for malignancy. Patient also had labs obtained at that time which showed unexplained anemia as well as a sharp increase in creatinine. Patient was then assessed by a home health nurse practitioner who noted weight loss, nodules present on his neck, and enlarged lymph nodes. Constellation of symptoms concerning for malignancy, possibly multiple myeloma. Patient was informed of this by his PCP Dr. Yisroel Ramming, however patient does not seen to have fully understood that conversation. Patient was previously seen by Dr. Alease Frame who ordered CT soft tissue and CT chest out of concern for imaging, but patient has been under the impression that he is not currently covered by insurance, so he did not proceed with this imaging. Patient was also urgently referred to nephrology by Dr. Yisroel Ramming given his worsening creatinine, however patient did not schedule this appointment either due to insurance concerns.  Today patient reports that his shoulder pain has resolved. He acknowledges the nodules on his neck but denies pain or tenderness. He denies any urinary symptoms. He denies any pain or issues at this time other than rash (described below).   Rash Present for the past two weeks.  Very pruritic. Has not gotten worse but has not improved. Located on his torso and back. Has not tried anything, either topically or by mouth, to help with symptoms. Denies trying any new lotions, soaps, or detergents.   Smoking status reviewed. Patient is former smoker.   Review of Systems See HPI.     Objective:  Physical Exam  Constitutional: He is oriented to person, place, and time.  Thin man in NAD  HENT:  Head: Normocephalic and  atraumatic.  Eyes: Conjunctivae and EOM are normal. Right eye exhibits no discharge. Left eye exhibits no discharge.  Neck:  Non-tender swollen lymph nodes present bilaterally primarily in submandibular regions  Pulmonary/Chest: Effort normal. No respiratory distress.  Neurological: He is alert and oriented to person, place, and time. Gait normal.  Skin:  Erythematous macular lesions present on torso and back with prominent excoriations. No vesicles, papules, or signs of tunneling. No lesions elsewhere, including on hands.   Psychiatric: Affect and judgment normal.      Assessment & Plan:  Loss of weight Patient with constellation of symptoms including sudden sharp decline in kidney function, bone pain with lesion noted on xray, anemia, weight loss, and enlarged lymph nodes concerning for malignancy, possible multiple myeloma. Explained to patient, but concerned he does not fully understand. After discussing with patient that he does indeed have insurance, patient willing to proceed with imaging and labs.  - CT chest and soft tissue rescheduled (will occur on 1/29) - Labs ordered today: SPEP/UPEP, CBC with diff and smear, CMP - Referral for nephrology placed again as patient has lost phone number from previous call - F/u in two weeks to discuss results and next steps   Rash and nonspecific skin eruption Most consistent in appearance with dry skin. No new lotions, soaps, or detergents to suggest contact dermatitis. Location of rash on torso also not consistent with contact dermatitis associated with plants/outdoor exposures. Not consistent with scabies or similar etiologies.  -  Moisturize with Vaseline or other thick emollient (Aquaphor, Eucerin) - Can mix 1% hydrocortisone cream with moisturizer if needed for itching  Precepted with Dr. Ardelia Mems.   Adin Hector, MD, MPH PGY-2 [facility] Pager [phone]

## 2016-07-01 NOTE — Assessment & Plan Note (Signed)
Most consistent in appearance with dry skin. No new lotions, soaps, or detergents to suggest contact dermatitis. Location of rash on torso also not consistent with contact dermatitis associated with plants/outdoor exposures. Not consistent with scabies or similar etiologies.  - Moisturize with Vaseline or other thick emollient (Aquaphor, Eucerin) - Can mix 1% hydrocortisone cream with moisturizer if needed for itching

## 2016-07-01 NOTE — Assessment & Plan Note (Signed)
Patient with constellation of symptoms including sudden sharp decline in kidney function, bone pain with lesion noted on xray, anemia, weight loss, and enlarged lymph nodes concerning for malignancy, possible multiple myeloma. Explained to patient, but concerned he does not fully understand. After discussing with patient that he does indeed have insurance, patient willing to proceed with imaging and labs.  - CT chest and soft tissue rescheduled (will occur on 1/29) - Labs ordered today: SPEP/UPEP, CBC with diff and smear, CMP - Referral for nephrology placed again as patient has lost phone number from previous call - F/u in two weeks to discuss results and next steps

## 2016-07-02 ENCOUNTER — Other Ambulatory Visit: Payer: Self-pay | Admitting: Internal Medicine

## 2016-07-02 ENCOUNTER — Telehealth: Payer: Self-pay | Admitting: Family Medicine

## 2016-07-02 DIAGNOSIS — R591 Generalized enlarged lymph nodes: Secondary | ICD-10-CM

## 2016-07-02 NOTE — Telephone Encounter (Signed)
Peer-to-peer has been requested to ordering MD back in December. Dr. Alease Frame, I have placed the patient's information in your box. Please call [employer] and proceed with the peer to peer to get patient's CTs approved.   [place]

## 2016-07-02 NOTE — Telephone Encounter (Signed)
Call placed. Peer to peer unable to be done in the necessary window of time.  New CT orders placed by physician to most recently evaluate patient. New data at that visit will most likely provide plenty of evidence for the necessity of these orders.  I apologize for the delay on my side.

## 2016-07-02 NOTE — Telephone Encounter (Signed)
Contacted Richard Moses concerning recent [facility] visit on 07/01/2016. Advised patient to follow-up with nephrologist with scheduled appointment when they contact him. Also discussed results from previous office visit. Patient has image studies on 07/05/2016. Patient was told to call and schedule [facility] appointment within the next 2 weeks to go over results and to assure he has a nephrology appointment. I carefully went over these tasks with the patient given his recent confusion on the situation. He states he understands now and has his insurance card with him. No other concerns or complaints per patient. Patient instructed to contact [facility] if questions arise. -- Harriet Butte, [place], PGY-1

## 2016-07-02 NOTE — Telephone Encounter (Signed)
[place] [facility] needs an authorization for the pt, pt has CT chest w/o and CT soft tissue neck w/o. Appointment is Monday @ 1:10pm. ep

## 2016-07-05 ENCOUNTER — Other Ambulatory Visit: Payer: Medicare HMO

## 2016-07-05 ENCOUNTER — Inpatient Hospital Stay: Admission: RE | Admit: 2016-07-05 | Payer: Medicare HMO | Source: Ambulatory Visit

## 2016-07-05 LAB — PROTEIN ELECTROPHORESIS, SERUM
ALPHA-1-GLOBULIN: 0.5 g/dL — AB (ref 0.2–0.3)
Albumin ELP: 3.7 g/dL — ABNORMAL LOW (ref 3.8–4.8)
Alpha-2-Globulin: 1.2 g/dL — ABNORMAL HIGH (ref 0.5–0.9)
BETA 2: 0.5 g/dL (ref 0.2–0.5)
Beta Globulin: 0.4 g/dL (ref 0.4–0.6)
GAMMA GLOBULIN: 1 g/dL (ref 0.8–1.7)
TOTAL PROTEIN, SERUM ELECTROPHOR: 7.3 g/dL (ref 6.1–8.1)

## 2016-07-05 LAB — PATHOLOGIST SMEAR REVIEW

## 2016-07-05 NOTE — Telephone Encounter (Signed)
Office notes from both Dr. Avon Gully and Dr. Alease Frame have been resubmitted to [facility] for review. Due to patient having Medicare, review can take up to 14 days per [employer]. Patient will likely need to be reschedule again. I will continue to check on status of auth request.

## 2016-07-08 DIAGNOSIS — R69 Illness, unspecified: Secondary | ICD-10-CM | POA: Diagnosis not present

## 2016-07-12 ENCOUNTER — Telehealth: Payer: Self-pay | Admitting: Family Medicine

## 2016-07-12 NOTE — Telephone Encounter (Signed)
Patient came in with notice of Denial of Medical Coverage.   Would like for  CT order to be resubmitted with new policy number [id number]    Please follow up with patient. He would like to know when it has been resubmitted.

## 2016-07-13 ENCOUNTER — Telehealth: Payer: Self-pay | Admitting: Family Medicine

## 2016-07-13 DIAGNOSIS — R221 Localized swelling, mass and lump, neck: Secondary | ICD-10-CM

## 2016-07-13 NOTE — Telephone Encounter (Signed)
[facility] reviewed pt's records and states pt needs an urgent renal u/s. They will schedule him there within the week. ep

## 2016-07-13 NOTE — Telephone Encounter (Signed)
Ultrasound scheduled for 2/12 at 2:25pm at [place] ([address]). Patient aware.

## 2016-07-13 NOTE — Telephone Encounter (Signed)
Called to obtain a Peer-to-Peer for the CT neck/soft tissue.  They would prefer a neck/soft tissue US as initial evaluation. I was anticipating this but do not feel this is likely to be from a thyroid source. That said, if the neck US is negative then we should have easy clearance for the CT if still felt is necessary.  1. US Neck ordered >> I will forward to staff to get this set up. 2. CT Neck >> unable to cancel order. Will ask staff to cancel this.  Once [place] is set up, please forward all additional care and questions to patient's PCP.  Elberta Leatherwood, MD,MS,  PGY3 07/13/2016 2:57 PM

## 2016-07-13 NOTE — Telephone Encounter (Signed)
See previous phone note.  

## 2016-07-14 ENCOUNTER — Ambulatory Visit (INDEPENDENT_AMBULATORY_CARE_PROVIDER_SITE_OTHER): Payer: Medicare HMO | Admitting: Family Medicine

## 2016-07-14 ENCOUNTER — Encounter: Payer: Self-pay | Admitting: Family Medicine

## 2016-07-14 VITALS — BP 146/58 | HR 113 | Temp 98.0°F | Ht 71.0 in | Wt 148.8 lb

## 2016-07-14 DIAGNOSIS — L299 Pruritus, unspecified: Secondary | ICD-10-CM | POA: Diagnosis not present

## 2016-07-14 DIAGNOSIS — R21 Rash and other nonspecific skin eruption: Secondary | ICD-10-CM

## 2016-07-14 DIAGNOSIS — N185 Chronic kidney disease, stage 5: Secondary | ICD-10-CM

## 2016-07-14 DIAGNOSIS — D649 Anemia, unspecified: Secondary | ICD-10-CM | POA: Diagnosis not present

## 2016-07-14 DIAGNOSIS — D631 Anemia in chronic kidney disease: Secondary | ICD-10-CM | POA: Insufficient documentation

## 2016-07-14 LAB — CBC
HCT: 25.4 % — ABNORMAL LOW (ref 38.5–50.0)
Hemoglobin: 8 g/dL — ABNORMAL LOW (ref 13.2–17.1)
MCH: 30.9 pg (ref 27.0–33.0)
MCHC: 31.5 g/dL — ABNORMAL LOW (ref 32.0–36.0)
MCV: 98.1 fL (ref 80.0–100.0)
MPV: 9.3 fL (ref 7.5–12.5)
Platelets: 311 10*3/uL (ref 140–400)
RBC: 2.59 MIL/uL — ABNORMAL LOW (ref 4.20–5.80)
RDW: 13.7 % (ref 11.0–15.0)
WBC: 7.1 10*3/uL (ref 3.8–10.8)

## 2016-07-14 LAB — COMPLETE METABOLIC PANEL WITH GFR
ALBUMIN: 3.5 g/dL — AB (ref 3.6–5.1)
ALK PHOS: 150 U/L — AB (ref 40–115)
ALT: 17 U/L (ref 9–46)
AST: 18 U/L (ref 10–35)
BILIRUBIN TOTAL: 0.4 mg/dL (ref 0.2–1.2)
BUN: 62 mg/dL — AB (ref 7–25)
CALCIUM: 9.6 mg/dL (ref 8.6–10.3)
CO2: 18 mmol/L — ABNORMAL LOW (ref 20–31)
CREATININE: 4.65 mg/dL — AB (ref 0.70–1.11)
Chloride: 109 mmol/L (ref 98–110)
GFR, Est African American: 13 mL/min — ABNORMAL LOW (ref >=60)
GFR, Est Non African American: 11 mL/min — ABNORMAL LOW (ref >=60)
GLUCOSE: 117 mg/dL — AB (ref 65–99)
Potassium: 4.2 mmol/L (ref 3.5–5.3)
SODIUM: 141 mmol/L (ref 135–146)
TOTAL PROTEIN: 6.9 g/dL (ref 6.1–8.1)

## 2016-07-14 LAB — TSH: TSH: 4.2 mIU/L (ref 0.40–4.50)

## 2016-07-14 MED ORDER — TRIAMCINOLONE ACETONIDE 0.1 % EX OINT: 1.0000 "application " | TOPICAL_OINTMENT | Freq: Two times a day (BID) | CUTANEOUS | 1 refills | Status: DC

## 2016-07-14 NOTE — Patient Instructions (Addendum)
It was a pleasure seeing you today in our clinic. Today we discussed your rash. Here is the treatment plan we have discussed and agreed upon together:   - I am obtaining blood tests today. I will mail you these results or contact you with any recommendations from their findings. - I prescribed you triamcinolone ointment. Apply this twice a day over the next 2 weeks. After 2 weeks apply it as needed. - I would strongly encourage you to wash all of your bed sheets and blankets in hot water before sleeping in bed again. - Follow-up with [place] in 2 weeks if your symptoms persist. Or sooner if they worsen.

## 2016-07-14 NOTE — Assessment & Plan Note (Signed)
Patient is here with signs and symptoms of a pruritic rash. Etiology currently unknown. At this time I believe the most likely diagnosis is a viral exanthem. Differential includes bedbugs, scabies, medication reaction, or other allergy. Will treat symptoms at this time. We'll obtain labs to rule out more serious causes of pruritic rash (thyroid, bilirubin) - Triamcinolone ointment twice a day 2 weeks then as needed after that. - Obtain CMP and TSH.

## 2016-07-14 NOTE — Assessment & Plan Note (Signed)
While looking through the patient's labs today it was noted that patient's hemoglobin had been down trending recently. Most recent value was 8.6. This was 2 weeks ago. Prior to that of value patient's hemoglobin was 9.4 nearly a month and a half ago. Will recheck today. - obtain CBC

## 2016-07-14 NOTE — Telephone Encounter (Signed)
Do you mean [facility] will schedule the renal ultrasound or do I need to do that? I don't see it ordered yet.

## 2016-07-14 NOTE — Progress Notes (Signed)
RASH  Had rash for ~7 days. Location: chest and back Medications tried: moisturizing lotion Similar rash in past: no New medications or antibiotics: no Tick, Insect or new pet exposure: no Recent travel: no New detergent or soap: no Immunocompromised: no  Symptoms Itching: yes Pain over rash: no Feeling ill all over: no Fever: no Mouth sores: no Face or tongue swelling: no Trouble breathing: no Joint swelling or pain: no  Patient states that he'll be thought of possible bedbugs as the etiology. Since that time he has done a "full clean" of his sheets and clothes. He had no improvement after this.  Review of Symptoms - see HPI PMH - Smoking status noted.    CC, SH/smoking status, and VS noted  Objective: BP (!) 146/58   Pulse (!) 113   Temp 98 F (36.7 C) (Oral)   Ht 5\' 11"  (1.803 m)   Wt 148 lb 12.8 oz (67.5 kg)   SpO2 100%   BMI 20.75 kg/m  Gen: NAD, alert, cooperative. CV: Well-perfused. Resp: Non-labored. Neuro: Sensation intact throughout. Integument: Numerous papules over his chest and back. Many of these seemed already scabbed over. [See photo below]         Assessment and plan:  Rash and nonspecific skin eruption Patient is here with signs and symptoms of a pruritic rash. Etiology currently unknown. At this time I believe the most likely diagnosis is a viral exanthem. Differential includes bedbugs, scabies, medication reaction, or other allergy. Will treat symptoms at this time. We'll obtain labs to rule out more serious causes of pruritic rash (thyroid, bilirubin) - Triamcinolone ointment twice a day 2 weeks then as needed after that. - Obtain CMP and TSH.  Anemia While looking through the patient's labs today it was noted that patient's hemoglobin had been down trending recently. Most recent value was 8.6. This was 2 weeks ago. Prior to that of value patient's hemoglobin was 9.4 nearly a month and a half ago. Will recheck today. - obtain  CBC   Orders Placed This Encounter  Procedures  . COMPLETE METABOLIC PANEL WITH GFR  . TSH  . CBC    Meds ordered this encounter  Medications  . DISCONTD: triamcinolone ointment (KENALOG) 0.1 %    Sig: Apply 1 application topically 2 (two) times daily. For 2 weeks. Then as needed.    Dispense:  80 g    Refill:  1  . triamcinolone ointment (KENALOG) 0.1 %    Sig: Apply 1 application topically 2 (two) times daily. For 2 weeks. Then as needed.    Dispense:  80 g    Refill:  1     Elberta Leatherwood, MD,MS,  PGY3 07/14/2016 6:17 PM

## 2016-07-15 ENCOUNTER — Encounter: Payer: Self-pay | Admitting: Family Medicine

## 2016-07-15 ENCOUNTER — Telehealth: Payer: Self-pay | Admitting: Family Medicine

## 2016-07-15 DIAGNOSIS — N189 Chronic kidney disease, unspecified: Secondary | ICD-10-CM

## 2016-07-15 NOTE — Telephone Encounter (Signed)
Discussed with patient over phone concerning recent [facility] visit 07/14/2016 for rash of upper back. Labs are drawn indicating continual elevated creatinine concerning for chronic kidney disease. Alkaline phos elevated also indicating possible bone involvement. Hemoglobin trending down over past couple of months. Patient does not endorse bloody stools or dark tarry stools, excessive weakness or fatigue, lightheadedness, gross blood in urine. Patient does endorse polyuria which has been chronic for him. Patient understands he is to see nephrology and will receive a renal ultrasound which I have placed an urgent referral for. Patient instructed to to call clinic for any questions or concerns or new changes. -- Harriet Butte, [place], PGY-1

## 2016-07-15 NOTE — Telephone Encounter (Signed)
Renal ultrasound order placed for [facility] imaging. Awaiting schedule. Please contact patient with appointment. Thank you. -- Harriet Butte, [place], PGY-1

## 2016-07-15 NOTE — Telephone Encounter (Signed)
According to Raquel Sarna they need for [place] to order/schedule it and once they have the results they can get him scheduled.

## 2016-07-15 NOTE — Telephone Encounter (Signed)
Scheduled renal ultrasound per nephrology recommendations concerning acute on chronic kidney disease with hematuria and proteinuria. -- Richard Moses, [place], PGY-1

## 2016-07-16 NOTE — Telephone Encounter (Signed)
Spoke with patient ultrasound will be on 2/12 at 4pm after they do his head/neck ultrasound at [place]. Patient informed.

## 2016-07-19 ENCOUNTER — Ambulatory Visit
Admission: RE | Admit: 2016-07-19 | Discharge: 2016-07-19 | Disposition: A | Discharge status: Home / Self Care | Payer: Medicare HMO | Source: Ambulatory Visit | Attending: Family Medicine | Admitting: Family Medicine

## 2016-07-19 DIAGNOSIS — M7989 Other specified soft tissue disorders: Secondary | ICD-10-CM

## 2016-07-19 DIAGNOSIS — E042 Nontoxic multinodular goiter: Secondary | ICD-10-CM | POA: Diagnosis not present

## 2016-07-19 DIAGNOSIS — R221 Localized swelling, mass and lump, neck: Secondary | ICD-10-CM

## 2016-07-19 DIAGNOSIS — N189 Chronic kidney disease, unspecified: Secondary | ICD-10-CM

## 2016-07-19 DIAGNOSIS — M542 Cervicalgia: Secondary | ICD-10-CM

## 2016-07-19 DIAGNOSIS — N1330 Unspecified hydronephrosis: Secondary | ICD-10-CM | POA: Diagnosis not present

## 2016-07-19 DIAGNOSIS — J449 Chronic obstructive pulmonary disease, unspecified: Secondary | ICD-10-CM | POA: Diagnosis not present

## 2016-07-19 DIAGNOSIS — M25511 Pain in right shoulder: Secondary | ICD-10-CM

## 2016-07-20 ENCOUNTER — Telehealth: Payer: Self-pay | Admitting: Family Medicine

## 2016-07-20 DIAGNOSIS — N1330 Unspecified hydronephrosis: Secondary | ICD-10-CM

## 2016-07-20 NOTE — Telephone Encounter (Signed)
Call report from [facility] Radiology: Spoke wit [place]. Impression:  Severe BILATERAL hydronephrosis slightly greater on RIGHT, though the ureters could not be definitely visualized ; prior CT exam from 2015 demonstrated significant prostatic enlargement, bladder wall thickening likely trabeculation, and BILATERAL Hydroureteronephrosis.  Will forward to PCP and sent message via AMION.  Derl [place], RN

## 2016-07-20 NOTE — Telephone Encounter (Signed)
Discussed with patient over phone concerning abnormal renal ultrasound results concerning for urinary obstruction likely secondary to enlarged prostate of unknown cause. Scheduled [facility] appointment on 2/15 at 3 PM for further workup and evaluation. Will place urgent referral to urology in the meantime. -- Harriet Butte, [place], PGY-1

## 2016-07-20 NOTE — Telephone Encounter (Signed)
Called patient and scheduled [facility] appointment on 2/15 at 3 PM with me. Placed urgent referral to urology. Patient seems resistant to go however we will discuss this at appointment. Thank you. -- Harriet Butte, [place], PGY-1

## 2016-07-21 NOTE — Progress Notes (Signed)
   Subjective:   Patient ID: Richard Moses    DOB: Feb 09, 1933, 83 y.o. male   MRN: [id number]  CC: "prostate issues"  HPI: Richard Moses is a 83 y.o. male who presents to clinic today for prostate enlargment. Problems discussed today are as follows:  Prostate enlargment: Patient at clinic concerning new RUS findings for b/l severe hydronephrosis and prostate enlargement with worsening creatinine and anemia. Patient says he was contacted by Nephrology but never received appt. Has appt with Urology tomorrow. Endorses chronic h/o difficulty urinating with steady stream and failure to empty at time. Denies worsening of symptoms, hematuria, pain when urinating.   ROS: complete ROS performed, see HPI for pertinent ROS.  [place]: HTN, NIDDM, CKDIII, BPH, anemia, urinary retention. Smoking status reviewed. Medications reviewed.  Objective:   BP 130/68   Pulse (!) 113   Temp 98 F (36.7 C)   Ht 5\' 11"  (1.803 m)   Wt 149 lb (67.6 kg)   SpO2 99%   BMI 20.78 kg/m  Vitals and nursing note reviewed.  General: well nourished, well developed, in no acute distress with non-toxic appearance HEENT: normocephalic, atraumatic, moist mucous membranes CV: regular rate and rhythm without murmurs, rubs, or gallops, no lower extremity edema Lungs: clear to auscultation bilaterally with normal work of breathing Abdomen: soft, non-tender, non-distended, normoactive bowel sounds Rectal: chaperone present, no external hemorrhoids or tags, rectal tone intact, symmetrically enlarged prostate without nodularity, no gross blood on exam Skin: warm, dry, no rashes or lesions, cap refill < 2 seconds Extremities: warm and well perfused, normal tone  Assessment & Plan:   Bilateral hydronephrosis Severe b/l with enlarged prostate per RUS. Worse than previous imaging. Worsening Cr and anemia concerning progressive renal failure. Likely obstructive. Prostate enlarged without irregularity. --Urgent referral for urology  placed, appt 2/16 at 9:45 AM --Urgent referral for nephrology placed, appt 2/19 at 10:45 AM --Pt without acute urinary retention, will not place cath --Emphasized importance for compliance with f/u given risk for ESRD --Hydronephrosis explained and info given to patient --RTC in 2 weeks  No orders of the defined types were placed in this encounter.  No orders of the defined types were placed in this encounter.   Harriet Butte, [place], PGY-1 07/22/2016 8:52 PM

## 2016-07-22 ENCOUNTER — Encounter: Payer: Self-pay | Admitting: Family Medicine

## 2016-07-22 ENCOUNTER — Ambulatory Visit (INDEPENDENT_AMBULATORY_CARE_PROVIDER_SITE_OTHER): Payer: Medicare HMO | Admitting: Family Medicine

## 2016-07-22 DIAGNOSIS — N1330 Unspecified hydronephrosis: Secondary | ICD-10-CM | POA: Diagnosis not present

## 2016-07-22 NOTE — Assessment & Plan Note (Addendum)
Severe b/l with enlarged prostate per RUS. Worse than previous imaging. Worsening Cr and anemia concerning progressive renal failure. Likely obstructive. Prostate enlarged without irregularity. --Urgent referral for urology placed, appt 2/16 at 9:45 AM --Urgent referral for nephrology placed, appt 2/19 at 10:45 AM --Pt without acute urinary retention, will not place cath --Emphasized importance for compliance with f/u given risk for ESRD --Hydronephrosis explained and info given to patient --RTC in 2 weeks

## 2016-07-22 NOTE — Patient Instructions (Signed)
It was a pleasure to meet you today. Please see below to review our plan for today's visit.  1. Please follow-up with urologist (bladder and prostate doctor) tomorrow with your scheduled appointment. It is very important that you make this appointment. See below for information concerning hydronephrosis. 2. We will recheck out to the nephrologist (kidney doctor) and determine when her appointment will be. Expect a phone call to notify you of your appointment time. 3. Return to clinic in 2 weeks to discuss the results of your appointments.  Please call the clinic at [phone] if your symptoms worsen or you have any concerns. It was my pleasure to see you. -- Harriet Butte, [place], PGY-1   Hydronephrosis Introduction Hydronephrosis is the enlargement of a kidney due to a blockage that stops urine from flowing out of the body. What are the causes? Common causes of this condition include:  A birth (congenital) defect of the kidney.  A congenital defect of the tube through which urine travels (ureter).  Kidney stones.  An enlarged prostate gland.  A tumor.  Cancer of the prostate, bladder, uterus, ovary, or colon.  A blood clot. What are the signs or symptoms? Symptoms of this condition include:  Pain or discomfort in your side (flank).  Swelling of the abdomen.  Pain in the abdomen.  Nausea and vomiting.  Fever.  Pain while passing urine.  Feeling of urgency to urinate.  Frequent urination.  Infection of the urinary tract. In some cases, there are no symptoms. How is this diagnosed? This condition may be diagnosed with:  A medical history.  A physical exam.  Blood and urine tests to check kidney function.  Imaging tests, such as an X-ray, ultrasound, CT scan, or MRI.  A test in which a rigid or flexible telescope (cystoscope) is used to view the site of the blockage. How is this treated? Treatment for this condition depends on  where the blockage is located, how long it has been there, and what caused it. The goal of treatment is to remove the blockage. Treatment options include:  A procedure to put in a soft tube to help drain urine.  Antibiotic medicines to treat or prevent infection.  Shock-wave therapy (lithotripsy) to help eliminate kidney stones. Follow these instructions at home:  Get lots of rest.  Drink enough fluid to keep your urine clear or pale yellow.  If you have a drain in, follow your health care provider's instructions about how to care for it.  Take medicines only as directed by your health care provider.  If you were prescribed an antibiotic medicine, finish all of it even if you start to feel better.  Keep all follow-up visits as directed by your health care provider. This is important. Contact a health care provider if:  You continue to have symptoms after treatment.  You develop new symptoms.  You have a problem with a drainage device.  Your urine becomes cloudy or bloody.  You have a fever. Get help right away if:  You have severe flank or abdominal pain.  You develop vomiting and are unable to keep fluids down. This information is not intended to replace advice given to you by your health care provider. Make sure you discuss any questions you have with your health care provider. Document Released: 03/21/2007 Document Revised: 10/30/2015 Document Reviewed: 05/20/2014  2017 Elsevier

## 2016-07-23 DIAGNOSIS — N401 Enlarged prostate with lower urinary tract symptoms: Secondary | ICD-10-CM | POA: Diagnosis not present

## 2016-07-23 DIAGNOSIS — R351 Nocturia: Secondary | ICD-10-CM | POA: Diagnosis not present

## 2016-07-23 DIAGNOSIS — R3914 Feeling of incomplete bladder emptying: Secondary | ICD-10-CM | POA: Diagnosis not present

## 2016-07-23 DIAGNOSIS — N3941 Urge incontinence: Secondary | ICD-10-CM | POA: Diagnosis not present

## 2016-07-26 DIAGNOSIS — D631 Anemia in chronic kidney disease: Secondary | ICD-10-CM | POA: Diagnosis not present

## 2016-07-26 DIAGNOSIS — N184 Chronic kidney disease, stage 4 (severe): Secondary | ICD-10-CM | POA: Diagnosis not present

## 2016-07-26 DIAGNOSIS — I129 Hypertensive chronic kidney disease with stage 1 through stage 4 chronic kidney disease, or unspecified chronic kidney disease: Secondary | ICD-10-CM | POA: Diagnosis not present

## 2016-07-26 DIAGNOSIS — N183 Chronic kidney disease, stage 3 (moderate): Secondary | ICD-10-CM | POA: Diagnosis not present

## 2016-07-26 DIAGNOSIS — N2581 Secondary hyperparathyroidism of renal origin: Secondary | ICD-10-CM | POA: Diagnosis not present

## 2016-07-26 DIAGNOSIS — N179 Acute kidney failure, unspecified: Secondary | ICD-10-CM | POA: Diagnosis not present

## 2016-08-06 DIAGNOSIS — R3914 Feeling of incomplete bladder emptying: Secondary | ICD-10-CM | POA: Diagnosis not present

## 2016-08-06 DIAGNOSIS — N1339 Other hydronephrosis: Secondary | ICD-10-CM | POA: Diagnosis not present

## 2016-08-11 DIAGNOSIS — N1339 Other hydronephrosis: Secondary | ICD-10-CM | POA: Diagnosis not present

## 2016-08-13 ENCOUNTER — Ambulatory Visit (INDEPENDENT_AMBULATORY_CARE_PROVIDER_SITE_OTHER): Payer: Medicare HMO | Admitting: Family Medicine

## 2016-08-13 ENCOUNTER — Encounter: Payer: Self-pay | Admitting: Family Medicine

## 2016-08-13 VITALS — BP 108/52 | HR 98 | Temp 97.4°F | Ht 71.0 in | Wt 124.4 lb

## 2016-08-13 DIAGNOSIS — R634 Abnormal weight loss: Secondary | ICD-10-CM

## 2016-08-13 DIAGNOSIS — D649 Anemia, unspecified: Secondary | ICD-10-CM | POA: Diagnosis not present

## 2016-08-13 LAB — POCT HEMOGLOBIN: Hemoglobin: 8.7 g/dL — AB (ref 14.1–18.1)

## 2016-08-13 NOTE — Patient Instructions (Signed)
Thank you for coming in to see [place] today. Please see below to review our plan for today's visit.  1. Please follow up with urology and nephrology as scheduled. 2. I have sent a referral for the GI doctor. They will contact you for an appointment. 3. Please try and take in food daily. You can try supplementing with ensure or carnation with each meal. 4. If you get severely weak, have feelings of passing out, or short of breath, please go to the emergency room. 4. Return to clinic in 1 week.  Please call the clinic at [phone] if your symptoms worsen or you have any concerns. It was my pleasure to see you. -- Harriet Butte, [place], PGY-1

## 2016-08-13 NOTE — Progress Notes (Signed)
   Subjective:   Patient ID: Richard Moses    DOB: [birth year], 83 y.o. male   MRN: [id number]  CC: "follow up"  HPI: Richard Moses is a 83 y.o. male who presents to clinic today follow up concerning recent AoCKD. Problems discussed today are as follows:  Chronic kidney disease: Pt seen by nephrology last month with 1 month f/u. Has also seen urology last week and now has a foley cath in place for this moth with clear yellow output. No concerns with cath. Has f/u in April. Denies hematuria.   Fatigue: Pt has been more tired feeling tired by walking from living room to kitchen. Pt denies SOB or CP. Has "no energy" and has been eating little to nothing (see below). Pt has been told he has anemia and will be getting an iron transfusion.   Protein calorie malnutrition: pt has had drop in weight approximately 20 lbs over past month. Has been eating less because "food has no taste." Pt denies orodysphagia or esophogeal dysphagia. Pt has been able to maintain fluids. Does consume 1 energy shake per day as supplement. Has been more constipated over past month. Has not had colonoscopy in over 10 years. Denies nausea or vomiting, melena or hematochezia.  ROS: complete ROS performed, see HPI for pertinent ROS.  [place]: HTN, NIDDM, CKDIII, BPH, anemia, urinary retention. Smoking status reviewed. Medications reviewed.  Objective:   BP (!) 108/52   Pulse 98   Temp 97.4 F (36.3 C) (Oral)   Ht 5\' 11"  (1.803 m)   Wt 124 lb 6.4 oz (56.4 kg)   SpO2 99%   BMI 17.35 kg/m  Vitals and nursing note reviewed.  General: emaciated, well developed, tired appearing with non-toxic appearance HEENT: normocephalic, atraumatic, moist mucous membranes Neck: supple, non-tender without lymphadenopathy CV: regular rate and rhythm without murmurs, rubs, or gallops, no lower extremity edema Lungs: clear to auscultation bilaterally with normal work of breathing Abdomen: soft, non-tender, non-distended, no masses or  organomegaly palpable, normoactive bowel sounds Skin: warm, dry, no rashes or lesions, cap refill < 2 seconds Extremities: warm and well perfused, normal tone  Assessment & Plan:   Protein-calorie malnutrition (HCC) Acute. Has 20 lb drop in less then 1 month. Last colonoscopy 2007 with 3 polyps per chart review. Constipation and fatigue of concern though denies rectal bleeding. No dysphagia symptoms per pt. Likely source of fatigue given reassuring Hgb. --Urgent referral to GI --Pt told to take ensure or carnation OTC TID w/ meals --Encouraged to maintain PO intake despite lack of pleasure from no taste  Anemia of chronic disease Has anemia of chronic disease. Followed by nephrology with 1 month f/u. Hgb 8.6 in office. Pt able to ambulate. --F/u nephrology --Will need to f/u on iron studies by nephrology  Orders Placed This Encounter  Procedures  . Ambulatory referral to Gastroenterology    Referral Priority:   Urgent    Referral Type:   Consultation    Referral Reason:   Specialty Services Required    Number of Visits Requested:   1  . Hemoglobin   No orders of the defined types were placed in this encounter.   Harriet Butte, [place], PGY-1 08/15/2016 2:06 PM

## 2016-08-13 NOTE — Assessment & Plan Note (Addendum)
Acute. Has 20 lb drop in less then 1 month. Last colonoscopy 2007 with 3 polyps per chart review. Constipation and fatigue of concern though denies rectal bleeding. No dysphagia symptoms per pt. Likely source of fatigue given reassuring Hgb. --Urgent referral to GI --Pt told to take ensure or carnation OTC TID w/ meals --Encouraged to maintain PO intake despite lack of pleasure from no taste

## 2016-08-13 NOTE — Assessment & Plan Note (Addendum)
Has anemia of chronic disease. Followed by nephrology with 1 month f/u. Hgb 8.6 in office. Pt able to ambulate. --F/u nephrology --Will need to f/u on iron studies by nephrology

## 2016-08-20 ENCOUNTER — Ambulatory Visit (INDEPENDENT_AMBULATORY_CARE_PROVIDER_SITE_OTHER): Payer: Medicare HMO | Admitting: Internal Medicine

## 2016-08-20 ENCOUNTER — Encounter: Payer: Self-pay | Admitting: Internal Medicine

## 2016-08-20 VITALS — BP 108/60 | HR 104 | Temp 97.9°F | Ht 71.0 in | Wt 125.2 lb

## 2016-08-20 DIAGNOSIS — R634 Abnormal weight loss: Secondary | ICD-10-CM

## 2016-08-20 DIAGNOSIS — K5900 Constipation, unspecified: Secondary | ICD-10-CM | POA: Diagnosis not present

## 2016-08-20 MED ORDER — POLYETHYLENE GLYCOL 3350 17 G PO PACK: 17.0000 g | PACK | Freq: Every day | ORAL | 0 refills | Status: DC

## 2016-08-20 NOTE — Patient Instructions (Addendum)
Please call Dr. Shanon Brow Read [facility] Iles as soon as possible. This is the GI doctor Internal medicine  [address] [place], [place] [address]  [phone]  Try miralax daily for your constipation.   Follow up with Dr. Yisroel Ramming in about 3 weeks or sooner if you have concerns.

## 2016-08-20 NOTE — Progress Notes (Signed)
   [facility] Phone: [phone]   Date of Visit: 08/20/2016   HPI:  Patient here for follow up for weight loss, constipation, and intermittent dysphagia.  Fatigue and Weight Loss:  - he was seen in clinic on 3/9 for protein calorie malnutrition. At that time, per note patient had weight loss of ~20lb voer the past month. Also reports that for the past several months, food does not taste as good. He has been taking Vitamin B12 and Folate "for years". He has been drinking supplemental shake three times a day with meal - reports that he also has intermittent trouble swallowing for the past few months. Ne notices that this is occasional. He has the sensation of food getting stuck at times. Reports that he has a "spot" in his neck where he notices food or phlegm gets stuck; he sometimes is able to press on this area and cough and sometimes he coughs up food. No pain with swallowing. Former smoker: quit over 34 years. 1 pack every 2-3 weeks.  - he was referred to GI urgently by his PCP this month. Patient reports he has not heard back from GI. Reports he does not have the contact information.  - reports he has been constipated as well. No blood in stool.   ROS: See HPI.  [place]:  PMH: HTN External Hemorrhoids HTN DM Hypothyroidism  CKD3 BPH with hx of elevated PSA   PHYSICAL EXAM: BP 108/60   Pulse (!) 104   Temp 97.9 F (36.6 C) (Oral)   Ht 5\' 11"  (1.803 m)   Wt 125 lb 3.2 oz (56.8 kg)   SpO2 99%   BMI 17.46 kg/m  GEN: NAD, thin appearing  HEENT: pharynx normal, neck is supple CV: RRR, no murmurs, rubs, or gallops PULM: CTAB, normal effort ABD: Soft, nontender, nondistended, NABS, no organomegaly SKIN: No rash or cyanosis; warm and well-perfused EXTR: No lower extremity edema or calf tenderness PSYCH: Mood and affect euthymic, normal rate and volume of speech NEURO: Awake, alert, no focal deficits grossly, normal speech   ASSESSMENT/PLAN: Loss  of weight: need to evaluate for malignancy starting with GI. PCP made an urgent referral to GI at last visit. Per chart review, it seems that the clinic has attempted to contact patient without success. Patient given contact information for GI physician. Continue supplement shakes.  Constipation, unspecified constipation type Miralax daily    Follow up with PCP in 3 weeks or sooner if there are concerns.   Smiley Houseman, MD PGY [place]

## 2016-08-24 ENCOUNTER — Telehealth: Payer: Self-pay | Admitting: Family Medicine

## 2016-08-24 NOTE — Telephone Encounter (Signed)
Called patient in regards to recent GI referral for weight loss. Patient states he went to [place] yesterday and was told his former GI specialist has retired. Patient states he is not certain if he has an appointment. Patient also states he has rescheduled his urology appointment for different day and April. Nephrology appointment will be also in the next few weeks. Patient has been able to eat ate at a [place] buffet earlier today. Has not been supplementing with ensure or other shakes. Encouraged patient to supplement given weight loss. Patient states he has "gained a few pounds." I requested patient to contact urology and nephrology to send prior appointment notes to [facility]. -- Harriet Butte, [place], PGY-1

## 2016-08-25 ENCOUNTER — Telehealth: Payer: Self-pay | Admitting: Family Medicine

## 2016-08-25 NOTE — Telephone Encounter (Signed)
Patient came by and insisted Dr. Yisroel Ramming told him he did not know why patient had been put on anyone elses schedule, to only put him on his schedule for 'continuity of care' although I informed pt.that all information is in computer, so I moved his appt to Dr. Ericka Pontiff schedule/ls

## 2016-08-25 NOTE — Telephone Encounter (Signed)
He has been called 2x by [facility], but no appointment is scheduled. Patient should call them back to set up appointment.

## 2016-08-25 NOTE — Telephone Encounter (Signed)
Thanks. I figured that was the case. Called him back and gave him contact number and hours. Pt says he will call in morning to schedule. -- Harriet Butte, [place], PGY-1

## 2016-08-30 ENCOUNTER — Encounter: Payer: Self-pay | Admitting: Family Medicine

## 2016-09-01 ENCOUNTER — Other Ambulatory Visit (HOSPITAL_COMMUNITY): Payer: Self-pay | Admitting: *Deleted

## 2016-09-01 DIAGNOSIS — N179 Acute kidney failure, unspecified: Secondary | ICD-10-CM | POA: Diagnosis not present

## 2016-09-01 DIAGNOSIS — I129 Hypertensive chronic kidney disease with stage 1 through stage 4 chronic kidney disease, or unspecified chronic kidney disease: Secondary | ICD-10-CM | POA: Diagnosis not present

## 2016-09-01 DIAGNOSIS — N2581 Secondary hyperparathyroidism of renal origin: Secondary | ICD-10-CM | POA: Diagnosis not present

## 2016-09-01 DIAGNOSIS — D631 Anemia in chronic kidney disease: Secondary | ICD-10-CM | POA: Diagnosis not present

## 2016-09-01 DIAGNOSIS — N184 Chronic kidney disease, stage 4 (severe): Secondary | ICD-10-CM | POA: Diagnosis not present

## 2016-09-02 ENCOUNTER — Encounter (HOSPITAL_COMMUNITY)
Admission: RE | Admit: 2016-09-02 | Discharge: 2016-09-02 | Disposition: A | Discharge status: Home / Self Care | Payer: Medicare HMO | Source: Ambulatory Visit | Attending: Nephrology | Admitting: Nephrology

## 2016-09-02 DIAGNOSIS — Z79899 Other long term (current) drug therapy: Secondary | ICD-10-CM | POA: Diagnosis not present

## 2016-09-02 DIAGNOSIS — D631 Anemia in chronic kidney disease: Secondary | ICD-10-CM | POA: Insufficient documentation

## 2016-09-02 DIAGNOSIS — N184 Chronic kidney disease, stage 4 (severe): Secondary | ICD-10-CM | POA: Diagnosis present

## 2016-09-02 DIAGNOSIS — Z5181 Encounter for therapeutic drug level monitoring: Secondary | ICD-10-CM | POA: Diagnosis not present

## 2016-09-02 LAB — POCT HEMOGLOBIN-HEMACUE: Hemoglobin: 8.4 g/dL — ABNORMAL LOW (ref 13.0–17.0)

## 2016-09-02 MED ORDER — EPOETIN ALFA 10000 UNIT/ML IJ SOLN: 10000.0000 [IU] | INTRAMUSCULAR | Status: DC

## 2016-09-02 MED ORDER — EPOETIN ALFA 10000 UNIT/ML IJ SOLN
INTRAMUSCULAR | Status: AC
  Administered 2016-09-02: 10000 [IU] via SUBCUTANEOUS
  Filled 2016-09-02: qty 1

## 2016-09-02 MED ORDER — CLONIDINE HCL 0.1 MG PO TABS: 0.1000 mg | ORAL_TABLET | Freq: Once | ORAL | Status: DC | PRN

## 2016-09-02 NOTE — Discharge Instructions (Signed)
Epoetin Alfa injection °What is this medicine? °EPOETIN ALFA (e POE e tin AL fa) helps your body make more red blood cells. This medicine is used to treat anemia caused by chronic kidney failure, cancer chemotherapy, or HIV-therapy. It may also be used before surgery if you have anemia. °This medicine may be used for other purposes; ask your health care provider or pharmacist if you have questions. °COMMON BRAND NAME(S): Epogen, Procrit °What should I tell my health care provider before I take this medicine? °They need to know if you have any of these conditions: °-blood clotting disorders °-cancer patient not on chemotherapy °-cystic fibrosis °-heart disease, such as angina or heart failure °-hemoglobin level of 12 g/dL or greater °-high blood pressure °-low levels of folate, iron, or vitamin B12 °-seizures °-an unusual or allergic reaction to erythropoietin, albumin, benzyl alcohol, hamster proteins, other medicines, foods, dyes, or preservatives °-pregnant or trying to get pregnant °-breast-feeding °How should I use this medicine? °This medicine is for injection into a vein or under the skin. It is usually given by a health care professional in a hospital or clinic setting. °If you get this medicine at home, you will be taught how to prepare and give this medicine. Use exactly as directed. Take your medicine at regular intervals. Do not take your medicine more often than directed. °It is important that you put your used needles and syringes in a special sharps container. Do not put them in a trash can. If you do not have a sharps container, call your pharmacist or healthcare provider to get one. °A special MedGuide will be given to you by the pharmacist with each prescription and refill. Be sure to read this information carefully each time. °Talk to your pediatrician regarding the use of this medicine in children. While this drug may be prescribed for selected conditions, precautions do apply. °Overdosage: If you  think you have taken too much of this medicine contact a poison control center or emergency room at once. °NOTE: This medicine is only for you. Do not share this medicine with others. °What if I miss a dose? °If you miss a dose, take it as soon as you can. If it is almost time for your next dose, take only that dose. Do not take double or extra doses. °What may interact with this medicine? °Do not take this medicine with any of the following medications: °-darbepoetin alfa °This list may not describe all possible interactions. Give your health care provider a list of all the medicines, herbs, non-prescription drugs, or dietary supplements you use. Also tell them if you smoke, drink alcohol, or use illegal drugs. Some items may interact with your medicine. °What should I watch for while using this medicine? °Your condition will be monitored carefully while you are receiving this medicine. °You may need blood work done while you are taking this medicine. °What side effects may I notice from receiving this medicine? °Side effects that you should report to your doctor or health care professional as soon as possible: °-allergic reactions like skin rash, itching or hives, swelling of the face, lips, or tongue °-breathing problems °-changes in vision °-chest pain °-confusion, trouble speaking or understanding °-feeling faint or lightheaded, falls °-high blood pressure °-muscle aches or pains °-pain, swelling, warmth in the leg °-rapid weight gain °-severe headaches °-sudden numbness or weakness of the face, arm or leg °-trouble walking, dizziness, loss of balance or coordination °-seizures (convulsions) °-swelling of the ankles, feet, hands °-unusually weak or tired °  Side effects that usually do not require medical attention (report to your doctor or health care professional if they continue or are bothersome): °-diarrhea °-fever, chills (flu-like symptoms) °-headaches °-nausea, vomiting °-redness, stinging, or swelling at  site where injected °This list may not describe all possible side effects. Call your doctor for medical advice about side effects. You may report side effects to FDA at 1-800-FDA-1088. °Where should I keep my medicine? °Keep out of the reach of children. °Store in a refrigerator between 2 and 8 degrees C (36 and 46 degrees F). Do not freeze or shake. Throw away any unused portion if using a single-dose vial. Multi-dose vials can be kept in the refrigerator for up to 21 days after the initial dose. Throw away unused medicine. °NOTE: This sheet is a summary. It may not cover all possible information. If you have questions about this medicine, talk to your doctor, pharmacist, or health care provider. °© 2018 Elsevier/Gold Standard (2016-01-12 19:42:31) ° °

## 2016-09-10 ENCOUNTER — Ambulatory Visit: Payer: Medicare HMO | Admitting: Internal Medicine

## 2016-09-13 DIAGNOSIS — N1330 Unspecified hydronephrosis: Secondary | ICD-10-CM | POA: Diagnosis not present

## 2016-09-15 NOTE — Progress Notes (Signed)
Subjective:   Patient ID: Richard Moses    DOB: 1932-06-19, 83 y.o. male   MRN: [id number]  CC: "Swelling"  HPI: Richard Moses is a 83 y.o. male who presents to clinic today for follow-up concerning anemia and weight loss along with urinary retention and constipation. Problems discussed today are as follows:  Lower extremity swelling: Patient endorses new onset swelling 2-3 weeks ago with gradual onset. Patient denies history of lower extremity swelling. As he tries to avoid eating excessive salt. Drinks lots of fluids throughout the day including coffee and water. Says his legs itch sometimes from the swelling. Using steroid cream with relief of symptoms.  Anemia: Energy level is back to baseline. Patient states he received his first Epo injection earlier today. Currently taking iron supplement as instructed. Denies rectal bleeding or hematuria. Followed by nephrology with next appointment later this month.  Weight loss: Weight has improved back to baseline. Appetite has improved and patient now has a desire to eat. Patient states he can taste food better. Says he is drinking Ensure and boost 3 times per day in addition to his meals. Having bowel movements almost daily with normal consistency.  Urinary retention: Patient states his catheter discontinued and functioning well. Producing yellow urine. No decrease in urination. Denies dysuria or hematuria.  ROS: complete ROS performed, see HPI for pertinent ROS.  [place]: HTN, NIDDM, CKDIV, BPH w/ urinary retention, anemia of chronic disease, protein calorie malnutrition. Smoking status reviewed. Medications reviewed.  Objective:   BP (!) 102/58   Pulse 91   Temp 97.7 F (36.5 C) (Oral)   Ht 5\' 11"  (1.803 m)   Wt 149 lb 12.8 oz (67.9 kg)   SpO2 99%   BMI 20.89 kg/m  Vitals and nursing note reviewed.  General: emaciated elderly male, well developed, in no acute distress with non-toxic appearance HEENT: normocephalic, atraumatic,  moist mucous membranes Neck: supple, non-tender without lymphadenopathy CV: regular rate and rhythm without murmurs, rubs, or gallops, no lower extremity edema Lungs: clear to auscultation bilaterally with normal work of breathing Abdomen: soft, non-tender, non-distended, no masses or organomegaly palpable, normoactive bowel sounds GU: urinary catheter in place with clear yellow urine Skin: warm, dry, no rashes or lesions, cap refill < 2 seconds Extremities: warm and well perfused, normal tone  Assessment & Plan:   Anemia of chronic disease Chronic. Hemoccult is stable at 8.3 office. Recently received erythropoietin injection earlier today. Overall looks less pale than last visit. No rectal bleeding or hematuria. --Continue iron supplements --Follow-up nephrologist appointment --Continue to receive her erythropoietin injection as scheduled  Protein-calorie malnutrition ([place]) Weight now back up to baseline at 149 pounds. Patient has better appetite and taking supplement drinks. Looks to have much more energy than last visit --Continue to encourage eating and supplementing with Ensure and boost TID w/ meals  Urinary retention Chronic. Urinary catheter working well. No gross hematuria appreciated. --Follow-up with urology appointment 5/1 --Discussed importance of monitoring for changes in urine including bleeding and decrease  Bilateral lower extremity edema Acute. Onset 2-3 weeks. 1+ pitting. No erythema or ulceration. Distal pulses intact. Likely related to CKD. --Educated on restricting salt intake to less than 2000 mg daily --Patient started to avoid drinking more than 6 glasses of water per day and avoiding excessive caffeine --F/u with his nephrologist --Purchase over-the-counter compression socks in the meantime --RTC in 3 months  Orders Placed This Encounter  Procedures  . Hemoglobin   No orders of the defined types  were placed in this encounter.   Harriet Butte,  [place], PGY-1 09/16/2016 5:20 PM

## 2016-09-16 ENCOUNTER — Ambulatory Visit (INDEPENDENT_AMBULATORY_CARE_PROVIDER_SITE_OTHER): Payer: Medicare HMO | Admitting: Family Medicine

## 2016-09-16 ENCOUNTER — Encounter: Payer: Self-pay | Admitting: Family Medicine

## 2016-09-16 ENCOUNTER — Encounter (HOSPITAL_COMMUNITY)
Admission: RE | Admit: 2016-09-16 | Discharge: 2016-09-16 | Disposition: A | Discharge status: Home / Self Care | Payer: Medicare HMO | Source: Ambulatory Visit | Attending: Nephrology | Admitting: Nephrology

## 2016-09-16 VITALS — BP 102/58 | HR 91 | Temp 97.7°F | Ht 71.0 in | Wt 149.8 lb

## 2016-09-16 DIAGNOSIS — N184 Chronic kidney disease, stage 4 (severe): Secondary | ICD-10-CM | POA: Insufficient documentation

## 2016-09-16 DIAGNOSIS — R339 Retention of urine, unspecified: Secondary | ICD-10-CM | POA: Diagnosis not present

## 2016-09-16 DIAGNOSIS — R600 Localized edema: Secondary | ICD-10-CM | POA: Diagnosis not present

## 2016-09-16 DIAGNOSIS — Z5181 Encounter for therapeutic drug level monitoring: Secondary | ICD-10-CM | POA: Insufficient documentation

## 2016-09-16 DIAGNOSIS — E440 Moderate protein-calorie malnutrition: Secondary | ICD-10-CM | POA: Diagnosis not present

## 2016-09-16 DIAGNOSIS — D631 Anemia in chronic kidney disease: Secondary | ICD-10-CM | POA: Diagnosis not present

## 2016-09-16 DIAGNOSIS — D638 Anemia in other chronic diseases classified elsewhere: Secondary | ICD-10-CM | POA: Diagnosis not present

## 2016-09-16 DIAGNOSIS — Z79899 Other long term (current) drug therapy: Secondary | ICD-10-CM | POA: Insufficient documentation

## 2016-09-16 LAB — POCT HEMOGLOBIN-HEMACUE: HEMOGLOBIN: 9.3 g/dL — AB (ref 13.0–17.0)

## 2016-09-16 LAB — POCT HEMOGLOBIN: Hemoglobin: 8.3 g/dL — AB (ref 14.1–18.1)

## 2016-09-16 MED ORDER — CLONIDINE HCL 0.1 MG PO TABS: 0.1000 mg | ORAL_TABLET | Freq: Once | ORAL | Status: DC | PRN

## 2016-09-16 MED ORDER — EPOETIN ALFA 10000 UNIT/ML IJ SOLN
INTRAMUSCULAR | Status: AC
  Filled 2016-09-16: qty 1

## 2016-09-16 MED ORDER — EPOETIN ALFA 10000 UNIT/ML IJ SOLN
10000.0000 [IU] | INTRAMUSCULAR | Status: DC
  Administered 2016-09-16: 10000 [IU] via SUBCUTANEOUS

## 2016-09-16 NOTE — Assessment & Plan Note (Signed)
Chronic. Urinary catheter working well. No gross hematuria appreciated. --Follow-up with urology appointment 5/1 --Discussed importance of monitoring for changes in urine including bleeding and decrease

## 2016-09-16 NOTE — Assessment & Plan Note (Addendum)
Chronic. Hemoccult is stable at 8.3 office. Recently received erythropoietin injection earlier today. Overall looks less pale than last visit. No rectal bleeding or hematuria. --Continue iron supplements --Follow-up nephrologist appointment --Continue to receive her erythropoietin injection as scheduled

## 2016-09-16 NOTE — Assessment & Plan Note (Addendum)
Acute. Onset 2-3 weeks. 1+ pitting. No erythema or ulceration. Distal pulses intact. Likely related to CKD. --Educated on restricting salt intake to less than 2000 mg daily --Patient started to avoid drinking more than 6 glasses of water per day and avoiding excessive caffeine --F/u with his nephrologist --Purchase over-the-counter compression socks in the meantime --RTC in 3 months

## 2016-09-16 NOTE — Assessment & Plan Note (Addendum)
Weight now back up to baseline at 149 pounds. Patient has better appetite and taking supplement drinks. Looks to have much more energy than last visit --Continue to encourage eating and supplementing with Ensure and boost TID w/ meals

## 2016-09-16 NOTE — Patient Instructions (Signed)
Thank you for coming in to see Richard Moses today. Please see below to review our plan for today's visit.  1. Your swelling is likely due to her kidney damage. Please restricting salt intake to less than 2000 mg daily. In addition try not to drink more than 6 cups of water per day. Keep your legs elevated at night and use compression stockings to help keep swelling down. 2. I am pleased to hear her weight is improved and your eating better. Please continue to take your Ensure and boost 3 times daily. Please follow-up with your GI appointment on 4/25. 3. Your hemoglobin is stable. Please continue to follow-up with the kidney doctor. You can discuss front desk about arranging for transfer of care to another nephrologist if you need. 4. Follow-up with your urologist on 5/1. 5. RTC in 2 months.  Please call the clinic at [phone] if your symptoms worsen or you have any concerns. It was my pleasure to see you. -- Harriet Butte, [place], PGY-1   Cooking With Less Salt Cooking with less salt is one way to reduce the amount of sodium you get from food. Depending on your condition and overall health, your health care provider or diet and nutrition specialist (dietitian) may recommend that you reduce your sodium intake. Most people should have less than 2,300 milligrams (mg) of sodium each day. If you have high blood pressure (hypertension), you may need to limit your sodium to 1,500 mg each day. Follow the tips below to help reduce your sodium intake. What do I need to know about cooking with less salt? Shopping   Buy sodium-free or low-sodium products. Look for the following words on food labels:  Low-sodium.  Sodium-free.  Reduced-sodium.  No salt added.  Unsalted.  Buy fresh or frozen vegetables. Avoid canned vegetables.  Avoid buying meats or protein foods that have been injected with broth or saline solution.  Avoid cured or smoked meats, such as hot dogs, bacon,  salami, ham, and bologna. Reading food labels   Check the food label before buying or using packaged ingredients.  Look for products with no more than 140 mg of sodium in one serving.  Do not choose foods with salt as one of the first three ingredients on the ingredients list. If salt is one of the first three ingredients, it usually means the item is high in sodium, because ingredients are listed in order of amount in the food item. Cooking   Use herbs, seasonings without salt, and spices as substitutes for salt in foods.  Use sodium-free baking soda when baking.  Grill, braise, or roast foods to add flavor with less salt.  Avoid adding salt to pasta, rice, or hot cereals while cooking.  Drain and rinse canned vegetables before use.  Avoid adding salt when cooking sweets and desserts.  Cook with low-sodium ingredients. What are some salt alternatives? The following are herbs, seasonings, and spices that can be used instead of salt to give taste to your food. Herbs should be fresh or dried. Do not choose packaged mixes. Next to the name of the herb, spice, or seasoning are some examples of foods you can pair it with. Herbs   Bay leaves - Soups, meat and vegetable dishes, and spaghetti sauce.  Basil - [employer], soups, pasta, and fish dishes.  Cilantro - Meat, poultry, and vegetable dishes.  Chili powder - Marinades and Mexican dishes.  Chives - Salad dressings and potato dishes.  Cumin -  Mexican dishes, couscous, and meat dishes.  Dill - Fish dishes, sauces, and salads.  Fennel - Meat and vegetable dishes, breads, and cookies.  Garlic (do not use garlic salt) - [place] dishes, meat dishes, salad dressings, and sauces.  Marjoram - Soups, potato dishes, and meat dishes.  Oregano - Pizza and spaghetti sauce.  Parsley - Salads, soups, pasta, and meat dishes.  Rosemary - [place] dishes, salad dressings, soups, and red meats.  Saffron - Fish dishes, pasta, and some  poultry dishes.  Sage - Stuffings and sauces.  Tarragon - Fish and [employer].  Thyme - Stuffing, meat, and fish dishes. Seasonings   Lemon juice - Fish dishes, poultry dishes, vegetables, and salads.  Vinegar - Salad dressings, vegetables, and fish dishes. Spices   Cinnamon - Sweet dishes, such as cakes, cookies, and puddings.  Cloves - Gingerbread, puddings, and marinades for meats.  Curry - Vegetable dishes, fish and poultry dishes, and stir-fry dishes.  Ginger - Vegetables dishes, fish dishes, and stir-fry dishes.  Nutmeg - Pasta, vegetables, poultry, fish dishes, and custard. What are some low-sodium ingredients and foods?  Fresh or frozen fruits and vegetables with no sauce added.  Fresh or frozen whole meats, poultry, and fish with no sauce added.  Eggs.  Noodles, pasta, quinoa, rice.  Shredded or puffed wheat or puffed rice.  Regular or quick oats.  Milk, yogurt, hard cheeses, and low-sodium cheeses. Good cheese choices include Swiss, [place]. Always check the label for the serving size and sodium content.  Unsalted butter or margarine.  Unsalted nuts.  Sherbet or ice cream (keep to  cup per serving).  Homemade pudding.  Sodium-free baking soda and baking powder. This is not a complete list of low-sodium ingredients and foods. Contact your dietitian for more options.  Summary  Cooking with less salt is one way to reduce the amount of sodium that you get from food.  Buy sodium-free or low-sodium products.  Check the food label before using or buying packaged ingredients.  Use herbs, seasonings without salt, and spices as substitutes for salt in foods. This information is not intended to replace advice given to you by your health care provider. Make sure you discuss any questions you have with your health care provider. Document Released: 05/24/2005 Document Revised: 06/01/2016 Document Reviewed: 06/01/2016 Elsevier  Interactive Patient Education  2017 [employer].

## 2016-09-29 ENCOUNTER — Ambulatory Visit: Payer: Medicare HMO | Admitting: Gastroenterology

## 2016-09-30 ENCOUNTER — Encounter (HOSPITAL_COMMUNITY): Payer: Medicare HMO

## 2016-10-05 DIAGNOSIS — N401 Enlarged prostate with lower urinary tract symptoms: Secondary | ICD-10-CM | POA: Diagnosis not present

## 2016-10-05 DIAGNOSIS — R351 Nocturia: Secondary | ICD-10-CM | POA: Diagnosis not present

## 2016-10-07 ENCOUNTER — Encounter (HOSPITAL_COMMUNITY)
Admission: RE | Admit: 2016-10-07 | Discharge: 2016-10-07 | Disposition: A | Discharge status: Home / Self Care | Payer: Medicare HMO | Source: Ambulatory Visit | Attending: Nephrology | Admitting: Nephrology

## 2016-10-07 DIAGNOSIS — Z79899 Other long term (current) drug therapy: Secondary | ICD-10-CM | POA: Insufficient documentation

## 2016-10-07 DIAGNOSIS — D631 Anemia in chronic kidney disease: Secondary | ICD-10-CM | POA: Insufficient documentation

## 2016-10-07 DIAGNOSIS — Z5181 Encounter for therapeutic drug level monitoring: Secondary | ICD-10-CM | POA: Insufficient documentation

## 2016-10-07 DIAGNOSIS — N184 Chronic kidney disease, stage 4 (severe): Secondary | ICD-10-CM | POA: Diagnosis present

## 2016-10-07 LAB — RENAL FUNCTION PANEL
Albumin: 3.4 g/dL — ABNORMAL LOW (ref 3.5–5.0)
Anion gap: 12 (ref 5–15)
BUN: 73 mg/dL — AB (ref 6–20)
CALCIUM: 8.9 mg/dL (ref 8.9–10.3)
CHLORIDE: 110 mmol/L (ref 101–111)
CO2: 19 mmol/L — AB (ref 22–32)
CREATININE: 3.15 mg/dL — AB (ref 0.61–1.24)
GFR, EST AFRICAN AMERICAN: 20 mL/min — AB (ref >60)
GFR, EST NON AFRICAN AMERICAN: 17 mL/min — AB (ref >60)
Glucose, Bld: 103 mg/dL — ABNORMAL HIGH (ref 65–99)
Phosphorus: 4.8 mg/dL — ABNORMAL HIGH (ref 2.5–4.6)
Potassium: 4.5 mmol/L (ref 3.5–5.1)
SODIUM: 141 mmol/L (ref 135–145)

## 2016-10-07 LAB — POCT HEMOGLOBIN-HEMACUE: HEMOGLOBIN: 9.5 g/dL — AB (ref 13.0–17.0)

## 2016-10-07 LAB — IRON AND TIBC
Iron: 76 ug/dL (ref 45–182)
Saturation Ratios: 26 % (ref 17.9–39.5)
TIBC: 290 ug/dL (ref 250–450)
UIBC: 214 ug/dL

## 2016-10-07 LAB — MAGNESIUM: MAGNESIUM: 2.1 mg/dL (ref 1.7–2.4)

## 2016-10-07 LAB — FERRITIN: FERRITIN: 179 ng/mL (ref 24–336)

## 2016-10-07 MED ORDER — EPOETIN ALFA 10000 UNIT/ML IJ SOLN
10000.0000 [IU] | INTRAMUSCULAR | Status: DC
  Administered 2016-10-07: 10000 [IU] via SUBCUTANEOUS

## 2016-10-07 MED ORDER — EPOETIN ALFA 10000 UNIT/ML IJ SOLN
INTRAMUSCULAR | Status: AC
  Filled 2016-10-07: qty 1

## 2016-10-07 MED ORDER — CLONIDINE HCL 0.1 MG PO TABS: 0.1000 mg | ORAL_TABLET | Freq: Once | ORAL | Status: DC | PRN

## 2016-10-12 DIAGNOSIS — R3914 Feeling of incomplete bladder emptying: Secondary | ICD-10-CM | POA: Diagnosis not present

## 2016-10-21 ENCOUNTER — Encounter (HOSPITAL_COMMUNITY)
Admission: RE | Admit: 2016-10-21 | Discharge: 2016-10-21 | Disposition: A | Discharge status: Home / Self Care | Payer: Medicare HMO | Source: Ambulatory Visit | Attending: Nephrology | Admitting: Nephrology

## 2016-10-21 DIAGNOSIS — N184 Chronic kidney disease, stage 4 (severe): Secondary | ICD-10-CM | POA: Diagnosis not present

## 2016-10-21 LAB — POCT HEMOGLOBIN-HEMACUE: Hemoglobin: 9.5 g/dL — ABNORMAL LOW (ref 13.0–17.0)

## 2016-10-21 MED ORDER — EPOETIN ALFA 10000 UNIT/ML IJ SOLN
10000.0000 [IU] | INTRAMUSCULAR | Status: DC
  Administered 2016-10-21: 10000 [IU] via SUBCUTANEOUS

## 2016-10-21 MED ORDER — EPOETIN ALFA 10000 UNIT/ML IJ SOLN
INTRAMUSCULAR | Status: AC
  Administered 2016-10-21: 10000 [IU] via SUBCUTANEOUS
  Filled 2016-10-21: qty 1

## 2016-10-26 DIAGNOSIS — I129 Hypertensive chronic kidney disease with stage 1 through stage 4 chronic kidney disease, or unspecified chronic kidney disease: Secondary | ICD-10-CM | POA: Diagnosis not present

## 2016-10-26 DIAGNOSIS — N184 Chronic kidney disease, stage 4 (severe): Secondary | ICD-10-CM | POA: Diagnosis not present

## 2016-10-26 DIAGNOSIS — N2581 Secondary hyperparathyroidism of renal origin: Secondary | ICD-10-CM | POA: Diagnosis not present

## 2016-10-26 DIAGNOSIS — D631 Anemia in chronic kidney disease: Secondary | ICD-10-CM | POA: Diagnosis not present

## 2016-11-04 ENCOUNTER — Encounter (HOSPITAL_COMMUNITY)
Admission: RE | Admit: 2016-11-04 | Discharge: 2016-11-04 | Disposition: A | Discharge status: Home / Self Care | Payer: Medicare HMO | Source: Ambulatory Visit | Attending: Nephrology | Admitting: Nephrology

## 2016-11-04 DIAGNOSIS — N184 Chronic kidney disease, stage 4 (severe): Secondary | ICD-10-CM

## 2016-11-04 LAB — POCT HEMOGLOBIN-HEMACUE: Hemoglobin: 10.4 g/dL — ABNORMAL LOW (ref 13.0–17.0)

## 2016-11-04 MED ORDER — EPOETIN ALFA 10000 UNIT/ML IJ SOLN
INTRAMUSCULAR | Status: AC
  Filled 2016-11-04: qty 1

## 2016-11-04 MED ORDER — EPOETIN ALFA 10000 UNIT/ML IJ SOLN
10000.0000 [IU] | INTRAMUSCULAR | Status: DC
  Administered 2016-11-04: 10000 [IU] via SUBCUTANEOUS

## 2016-11-09 ENCOUNTER — Ambulatory Visit (INDEPENDENT_AMBULATORY_CARE_PROVIDER_SITE_OTHER): Payer: Medicare HMO | Admitting: Gastroenterology

## 2016-11-09 ENCOUNTER — Encounter: Payer: Self-pay | Admitting: Gastroenterology

## 2016-11-09 VITALS — BP 142/64 | HR 79 | Ht 71.0 in | Wt 147.8 lb

## 2016-11-09 DIAGNOSIS — Z86010 Personal history of colonic polyps: Secondary | ICD-10-CM

## 2016-11-09 DIAGNOSIS — R1312 Dysphagia, oropharyngeal phase: Secondary | ICD-10-CM | POA: Diagnosis not present

## 2016-11-09 DIAGNOSIS — R3914 Feeling of incomplete bladder emptying: Secondary | ICD-10-CM | POA: Diagnosis not present

## 2016-11-09 NOTE — Progress Notes (Signed)
HPI :  83 yo male with a history of CKD, chronic anemia, DM, dysphagia, former patient of Dr. Sharlett Iles, has not been seen since 2014, here for a visit to establish care for issues swallowing pills.  He reports he hs been having capsules get "stuck" in his pharynx and has a hard time getting them down. He states they remain "stuck" until they dissolve in his mouth. He endorses a history of aspiration in the past which does not happen frequently. He denies any dysphagia to solids. Previously he had dysphagia to solids, he reports benefit with dilation in 2014 which treated a distal esophageal stricture. He reports his current symptoms are much different. He denies any trouble with liquids as well. He reports capsule are the main thing this causes issues and he can't force it down. He reports having been given the Heimlich maneuver for food bolus in his airway in the past. He doesn't have much GERD that bothers him other than regurgitation when he bends over.  He states he had lost some weight, had been down to 116 lbs at one point in time, but back to up 147 lbs. He reported loss of appetite at that time. He thinks appetite is now good, no issues with that.   He has had an anemia being monitored over time. Last Hgb 10.4. Iron stores normal in 10/07/16. He is on Epo injection, thought to be due to CKD  Modified barium swallow in 2014 - mild oropharyngeal dysphagia and thought to have esophageal dysphagia Barium swallow 09/14/12 - flash laryngeal penetration, pooling in the valleculae, only partially clears with swallows  EGD 10/25/12 - stricture dilated to 54Fr [place], normal stomach. Duodenum nl, hiatal hernia Colonoscopy - 10/20/05 - sigmoid polyp - 1cm TVA, hemorrhoids    Past Medical History:  Diagnosis Date  . Abnormal MRI, cervical spine 09/29/10   Mildly abnormal MRI cervical spine demonstrating mild spondylosis and disc bulging from C4-5 down to C7-T1.  No spinal stenosis or foraminal  narrowing  . Anemia   . Cervical disc herniation 09/29/10   C4-C5 and C7-T1  . Chronic kidney disease (CKD)   . Chronic left-sided headaches   . Colon polyp 2007  . Diabetes mellitus   . External hemorrhoids without mention of complication 2706  . Hyperlipidemia   . Hypertension   . Hypothyroidism 09/29/10   Untreated. Patient not interested in Rx.   . Knee pain, bilateral 02/05/2013   Pain since a fall in 2014  . MRI of brain abnormal 09/29/10   Abnormal MRI of brain, demonstrating mild atrophy  . Neck pain on left side 2012      Past Surgical History:  Procedure Laterality Date  . MELANOMA EXCISION     left side of neck  . RHINOPLASTY     x2    Family History  Problem Relation Age of Onset  . Cancer Father 98       Renal CA  . Cancer Sister        Brain   . Cancer Brother        Brain  . Colon cancer Neg Hx   . Stomach cancer Neg Hx    Social History  Substance Use Topics  . Smoking status: Former Smoker    Types: Cigarettes    Quit date: 12/06/1966  . Smokeless tobacco: Never Used  . Alcohol use 0.0 oz/week     Comment: occasionally - had cut back since diabetes   Current Outpatient Prescriptions  Medication Sig Dispense Refill  . Barberry-Oreg Grape-Goldenseal (BERBERINE COMPLEX PO) Take 500 mg by mouth 3 (three) times daily with meals. Reported on 12/11/2015    . ferrous sulfate 324 (65 Fe) MG TBEC Take by mouth.    . Multiple Vitamins-Minerals (MENS MULTIVITAMIN PLUS) TABS Take 1 tablet by mouth daily after breakfast.    . polyethylene glycol (MIRALAX / GLYCOLAX) packet Take 17 g by mouth daily. 30 each 0   No current facility-administered medications for this visit.    No Known Allergies   Review of Systems: All systems reviewed and negative except where noted in HPI.   Lab Results  Component Value Date   WBC 7.1 07/14/2016   HGB 10.4 (L) 11/04/2016   HCT 25.4 (L) 07/14/2016   MCV 98.1 07/14/2016   PLT 311 07/14/2016   CBC Latest Ref Rng & Units  11/04/2016 10/21/2016 10/07/2016  WBC 3.8 - 10.8 K/uL - - -  Hemoglobin 13.0 - 17.0 g/dL 10.4(L) 9.5(L) 9.5(L)  Hematocrit 38.5 - 50.0 % - - -  Platelets 140 - 400 K/uL - - -   Lab Results  Component Value Date   IRON 76 10/07/2016   TIBC 290 10/07/2016   FERRITIN 179 10/07/2016     Physical Exam: BP (!) 142/64   Pulse 79   Ht 5\' 11"  (1.803 m)   Wt 147 lb 12.8 oz (67 kg)   BMI 20.61 kg/m  Constitutional: Pleasant,well-developed, male in no acute distress. HEENT: Normocephalic and atraumatic. Conjunctivae are normal. No scleral icterus. Neck supple.  Cardiovascular: Normal rate, regular rhythm.  Pulmonary/chest: Effort normal and breath sounds normal. No wheezing, rales or rhonchi. Abdominal: Soft, nondistended, nontender.  There are no masses palpable. No hepatomegaly. Extremities: no edema Lymphadenopathy: No cervical adenopathy noted. Neurological: Alert and oriented to person place and time. Skin: Skin is warm and dry. No rashes noted. Psychiatric: Normal mood and affect. Behavior is normal.   ASSESSMENT AND PLAN: 83 year old man with a history of GERD and distal esophageal stricture status post dilation in 2014, presenting with symptoms most concerning for oropharyngeal dysphagia. He specifically has difficulty with capsules retaining his posterior pharynx, he denies dysphagia to solids or liquids otherwise. He had a history of aspiration in the past with swallowing prior barium study in 2014 suggested this however modified barium swallow suggested multifactorial etiology.   I don't think his distal esophageal stricture is causing his current symptoms, which seem more so oropharyngeal in etiology. Will recommend a modified barium swallow to reassess his anatomy. Further recommendations will be made pending this result.  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 He denies any problems with his bowels. At his 83 he states he does not want any  further surveillance colonoscopies. His iron stores are normal, his anemia appears to be due to CKD.   All questions answered, he agreed with the plan, will await modified barium swallow.   [place] Cellar, MD [facility] Pager [phone]  CC: [place] Bing, DO

## 2016-11-09 NOTE — Patient Instructions (Signed)
If you are age 81 or older, your body mass index should be between 23-30. Your Body mass index is 20.61 kg/m. If this is out of the aforementioned range listed, please consider follow up with your Primary Care Provider.  If you are age 45 or younger, your body mass index should be between 19-25. Your Body mass index is 20.61 kg/m. If this is out of the aformentioned range listed, please consider follow up with your Primary Care Provider.   You have been scheduled for a modified barium swallow on            at          . Please arrive 15 minutes prior to your test for registration. You will go to          Radiology (1st Floor) for your appointment. Please refrain from eating or drinking anything 4 hours prior to your test. Should you need to cancel or reschedule your appointment, please contact [phone] [facility]) or [phone] [place]). _____________________________________________________________________ A Modified Barium Swallow Study, or MBS, is a special x-ray that is taken to check swallowing skills. It is carried out by a Stage manager and a Psychologist, clinical (SLP). During this test, yourmouth, throat, and esophagus, a muscular tube which connects your mouth to your stomach, is checked. The test will help you, your doctor, and the SLP plan what types of foods and liquids are easier for you to swallow. The SLP will also identify positions and ways to help you swallow more easily and safely. What will happen during an MBS? You will be taken to an x-ray room and seated comfortably. You will be asked to swallow small amounts of food and liquid mixed with barium. Barium is a liquid or paste that allows images of your mouth, throat and esophagus to be seen on x-ray. The x-ray captures moving images of the food you are swallowing as it travels from your mouth through your throat and into your esophagus. This test helps identify whether food or liquid is entering your lungs  (aspiration). The test also shows which part of your mouth or throat lacks strength or coordination to move the food or liquid in the right direction. This test typically takes 30 minutes to 1 hour to complete. _______________________________________________________________________

## 2016-11-10 ENCOUNTER — Other Ambulatory Visit (HOSPITAL_COMMUNITY): Payer: Self-pay | Admitting: Gastroenterology

## 2016-11-10 ENCOUNTER — Other Ambulatory Visit: Payer: Self-pay | Admitting: Family Medicine

## 2016-11-10 ENCOUNTER — Telehealth: Payer: Self-pay | Admitting: Family Medicine

## 2016-11-10 DIAGNOSIS — R1319 Other dysphagia: Secondary | ICD-10-CM

## 2016-11-10 DIAGNOSIS — N139 Obstructive and reflux uropathy, unspecified: Secondary | ICD-10-CM

## 2016-11-10 NOTE — Telephone Encounter (Signed)
Pt needs a referral sent to [facility] [address]  In [facility]. He needs the referral so he can schedule an appt. Please advise

## 2016-11-10 NOTE — Telephone Encounter (Signed)
Referral placed.  -- Harriet Butte, [place], PGY-1

## 2016-11-15 ENCOUNTER — Ambulatory Visit (HOSPITAL_COMMUNITY)
Admission: RE | Admit: 2016-11-15 | Discharge: 2016-11-15 | Disposition: A | Discharge status: Home / Self Care | Payer: Medicare HMO | Source: Ambulatory Visit | Attending: Gastroenterology | Admitting: Gastroenterology

## 2016-11-15 DIAGNOSIS — R1319 Other dysphagia: Secondary | ICD-10-CM

## 2016-11-15 DIAGNOSIS — K219 Gastro-esophageal reflux disease without esophagitis: Secondary | ICD-10-CM | POA: Diagnosis not present

## 2016-11-15 DIAGNOSIS — R1312 Dysphagia, oropharyngeal phase: Secondary | ICD-10-CM | POA: Diagnosis not present

## 2016-11-15 DIAGNOSIS — R1310 Dysphagia, unspecified: Secondary | ICD-10-CM | POA: Diagnosis not present

## 2016-11-18 ENCOUNTER — Encounter (HOSPITAL_COMMUNITY)
Admission: RE | Admit: 2016-11-18 | Discharge: 2016-11-18 | Disposition: A | Discharge status: Home / Self Care | Payer: Medicare HMO | Source: Ambulatory Visit | Attending: Nephrology | Admitting: Nephrology

## 2016-11-18 DIAGNOSIS — Z79899 Other long term (current) drug therapy: Secondary | ICD-10-CM | POA: Diagnosis not present

## 2016-11-18 DIAGNOSIS — D631 Anemia in chronic kidney disease: Secondary | ICD-10-CM | POA: Diagnosis not present

## 2016-11-18 DIAGNOSIS — N184 Chronic kidney disease, stage 4 (severe): Secondary | ICD-10-CM | POA: Insufficient documentation

## 2016-11-18 DIAGNOSIS — Z5181 Encounter for therapeutic drug level monitoring: Secondary | ICD-10-CM | POA: Diagnosis not present

## 2016-11-18 LAB — POCT HEMOGLOBIN-HEMACUE: HEMOGLOBIN: 10.6 g/dL — AB (ref 13.0–17.0)

## 2016-11-18 LAB — RENAL FUNCTION PANEL
ALBUMIN: 3.6 g/dL (ref 3.5–5.0)
Anion gap: 12 (ref 5–15)
BUN: 73 mg/dL — ABNORMAL HIGH (ref 6–20)
CALCIUM: 8.9 mg/dL (ref 8.9–10.3)
CO2: 17 mmol/L — ABNORMAL LOW (ref 22–32)
Chloride: 111 mmol/L (ref 101–111)
Creatinine, Ser: 3.08 mg/dL — ABNORMAL HIGH (ref 0.61–1.24)
GFR calc non Af Amer: 17 mL/min — ABNORMAL LOW (ref >60)
GFR, EST AFRICAN AMERICAN: 20 mL/min — AB (ref >60)
GLUCOSE: 100 mg/dL — AB (ref 65–99)
PHOSPHORUS: 5.2 mg/dL — AB (ref 2.5–4.6)
POTASSIUM: 4 mmol/L (ref 3.5–5.1)
SODIUM: 140 mmol/L (ref 135–145)

## 2016-11-18 LAB — IRON AND TIBC
Iron: 88 ug/dL (ref 45–182)
SATURATION RATIOS: 32 % (ref 17.9–39.5)
TIBC: 272 ug/dL (ref 250–450)
UIBC: 184 ug/dL

## 2016-11-18 LAB — MAGNESIUM: Magnesium: 2.2 mg/dL (ref 1.7–2.4)

## 2016-11-18 LAB — FERRITIN: Ferritin: 134 ng/mL (ref 24–336)

## 2016-11-18 MED ORDER — EPOETIN ALFA 10000 UNIT/ML IJ SOLN
INTRAMUSCULAR | Status: AC
  Filled 2016-11-18: qty 1

## 2016-11-18 MED ORDER — EPOETIN ALFA 10000 UNIT/ML IJ SOLN
10000.0000 [IU] | INTRAMUSCULAR | Status: DC
  Administered 2016-11-18: 10000 [IU] via SUBCUTANEOUS

## 2016-11-29 NOTE — Progress Notes (Signed)
   Subjective:   Patient ID: Richard Moses    DOB: Jul 11, 1932, 83 y.o. male   MRN: [id number]  CC: "For pain"  HPI: [name] is a 83 y.o. male who presents to clinic today For foot pain. Problems discussed today are as follows:  Foot pain: Onset 3-4 weeks ago localized on base of left big toe. Patient states pain is burning in characteristic and intermittent, worse in the evening. Patient denies involvement with other digits are right foot involvement. Denies history of similar pain. Has been applying Aspercreme with some improvement which he attributes to the lidocaine. ROS: Denies fevers or chills, decreased motor or sensory function, nausea or vomiting, joint pain.  Diabetes: Patient has not been recording glucose levels requesting refills of his lancets. He is currently watching his carbohydrate intake and trying to stay active.  Bilateral lower extremity edema: Improved since last office visit. Patient denies changes to his salt intake or fluid intake.  Complete ROS performed, see HPI for pertinent.  [place]: HTN, NIDDM, CKDIV, BPH w/ urinary retention, anemia of chronic disease, protein calorie malnutrition. H/o rhinoplasty x2, melanoma excision L-neck. Father h/o renal CA, sister and borther h/o brain CA. Smoking status reviewed. Medications reviewed.  Objective:   BP (!) 146/70   Pulse 70   Temp 97.5 F (36.4 C) (Oral)   Ht 5\' 11"  (1.803 m)   Wt 142 lb 9.6 oz (64.7 kg)   SpO2 99%   BMI 19.89 kg/m  Vitals and nursing note reviewed.  General: well nourished, well developed, in no acute distress with non-toxic appearance HEENT: normocephalic, atraumatic, moist mucous membranes CV: regular rate and rhythm without murmurs, rubs, or gallops, mild lower extremity edema Lungs: clear to auscultation bilaterally with normal work of breathing Abdomen: soft, non-tender, non-distended, normoactive bowel sounds Skin: warm, dry, no rashes or lesions, cap refill < 2  seconds Extremities: warm and well perfused, normal tone, 2+ pedal pulses bilaterally without cyanosis of digits  Assessment & Plan:   Peripheral neuropathy Acute. Localized to left big toe. Unusual presentation though history insistent with probable neuropathy. No signs of rash or gout. Creatinine clearance 17. --Will initiate gabapentin 200 mg daily, renally dosed, can go as high as 300 mg daily if needed --We'll consider alternative medication like Lyrica if not improved --RTC 3 months  Bilateral lower extremity edema Chronic. Improved. Bilateral up to knee. No signs of DVT. Likely due to chronic kidney disease. --Discussed with patient importance of shifting amount of fluid and salt intake --Keep feet elevated when sleeping --Could consider compression stockings during next visit if persists  Diet-controlled diabetes mellitus ([place]) Chronic. Stable. A1c 5.9. Complaining of new onset foot pain consistent with peripheral neuropathy. --See note for foot pain --Patient no longer requiring this checks due to controlled diabetes without insulin use  Orders Placed This Encounter  Procedures  . HgB A1c   Meds ordered this encounter  Medications  . gabapentin (NEURONTIN) 100 MG capsule    Sig: Take 1 capsule (100 mg total) by mouth 2 (two) times daily.    Dispense:  180 capsule    Refill:  [place], [place], PGY-1 11/30/2016 2:58 PM

## 2016-11-30 ENCOUNTER — Ambulatory Visit (INDEPENDENT_AMBULATORY_CARE_PROVIDER_SITE_OTHER): Payer: Medicare HMO | Admitting: Family Medicine

## 2016-11-30 ENCOUNTER — Encounter: Payer: Self-pay | Admitting: Family Medicine

## 2016-11-30 ENCOUNTER — Other Ambulatory Visit (HOSPITAL_COMMUNITY): Payer: Self-pay | Admitting: *Deleted

## 2016-11-30 VITALS — BP 146/70 | HR 70 | Temp 97.5°F | Ht 71.0 in | Wt 142.6 lb

## 2016-11-30 DIAGNOSIS — R600 Localized edema: Secondary | ICD-10-CM

## 2016-11-30 DIAGNOSIS — G589 Mononeuropathy, unspecified: Secondary | ICD-10-CM | POA: Diagnosis not present

## 2016-11-30 DIAGNOSIS — E119 Type 2 diabetes mellitus without complications: Secondary | ICD-10-CM | POA: Diagnosis not present

## 2016-11-30 DIAGNOSIS — G629 Polyneuropathy, unspecified: Secondary | ICD-10-CM | POA: Insufficient documentation

## 2016-11-30 LAB — POCT GLYCOSYLATED HEMOGLOBIN (HGB A1C): HEMOGLOBIN A1C: 5.9

## 2016-11-30 MED ORDER — GABAPENTIN 100 MG PO CAPS: 100.0000 mg | ORAL_CAPSULE | Freq: Two times a day (BID) | ORAL | 3 refills | Status: DC

## 2016-11-30 NOTE — Patient Instructions (Signed)
Thank you for coming in to see [place] today. Please see below to review our plan for today's visit.  1. I suspect the pain on her left toe is related to neuropathy. I would like to start a medication called gabapentin. He will take 200 mg daily. This does take several weeks to a work so give it time. 2. Her A1c is well controlled. Noted by G not to check your blood sugars given that your diabetes is controlled without medications. 3. Return to clinic in 3 months or sooner if needed.  Please call the clinic at [phone] if your symptoms worsen or you have any concerns. It was my pleasure to see you. -- Harriet Butte, [place], PGY-1  Gabapentin capsules or tablets What is this medicine? GABAPENTIN (GA ba pen tin) is used to control partial seizures in adults with epilepsy. It is also used to treat certain types of nerve pain. This medicine may be used for other purposes; ask your health care provider or pharmacist if you have questions. COMMON BRAND NAME(S): Active-PAC with Gabapentin, Gabarone, Neurontin What should I tell my health care provider before I take this medicine? They need to know if you have any of these conditions: -kidney disease -suicidal thoughts, plans, or attempt; a previous suicide attempt by you or a family member -an unusual or allergic reaction to gabapentin, other medicines, foods, dyes, or preservatives -pregnant or trying to get pregnant -breast-feeding How should I use this medicine? Take this medicine by mouth with a glass of water. Follow the directions on the prescription label. You can take it with or without food. If it upsets your stomach, take it with food.Take your medicine at regular intervals. Do not take it more often than directed. Do not stop taking except on your doctor's advice. If you are directed to break the 600 or 800 mg tablets in half as part of your dose, the extra half tablet should be used for the next dose. If you  have not used the extra half tablet within 28 days, it should be thrown away. A special MedGuide will be given to you by the pharmacist with each prescription and refill. Be sure to read this information carefully each time. Talk to your pediatrician regarding the use of this medicine in children. Special care may be needed. Overdosage: If you think you have taken too much of this medicine contact a poison control center or emergency room at once. NOTE: This medicine is only for you. Do not share this medicine with others. What if I miss a dose? If you miss a dose, take it as soon as you can. If it is almost time for your next dose, take only that dose. Do not take double or extra doses. What may interact with this medicine? Do not take this medicine with any of the following medications: -other gabapentin products This medicine may also interact with the following medications: -alcohol -antacids -antihistamines for allergy, cough and cold -certain medicines for anxiety or sleep -certain medicines for depression or psychotic disturbances -homatropine; hydrocodone -naproxen -narcotic medicines (opiates) for pain -phenothiazines like chlorpromazine, mesoridazine, prochlorperazine, thioridazine This list may not describe all possible interactions. Give your health care provider a list of all the medicines, herbs, non-prescription drugs, or dietary supplements you use. Also tell them if you smoke, drink alcohol, or use illegal drugs. Some items may interact with your medicine. What should I watch for while using this medicine? Visit your doctor or health  care professional for regular checks on your progress. You may want to keep a record at home of how you feel your condition is responding to treatment. You may want to share this information with your doctor or health care professional at each visit. You should contact your doctor or health care professional if your seizures get worse or if you  have any new types of seizures. Do not stop taking this medicine or any of your seizure medicines unless instructed by your doctor or health care professional. Stopping your medicine suddenly can increase your seizures or their severity. Wear a medical identification bracelet or chain if you are taking this medicine for seizures, and carry a card that lists all your medications. You may get drowsy, dizzy, or have blurred vision. Do not drive, use machinery, or do anything that needs mental alertness until you know how this medicine affects you. To reduce dizzy or fainting spells, do not sit or stand up quickly, especially if you are an older patient. Alcohol can increase drowsiness and dizziness. Avoid alcoholic drinks. Your mouth may get dry. Chewing sugarless gum or sucking hard candy, and drinking plenty of water will help. The use of this medicine may increase the chance of suicidal thoughts or actions. Pay special attention to how you are responding while on this medicine. Any worsening of mood, or thoughts of suicide or dying should be reported to your health care professional right away. Women who become pregnant while using this medicine may enroll in the [place] Pregnancy Registry by calling [phone]. This registry collects information about the safety of antiepileptic drug use during pregnancy. What side effects may I notice from receiving this medicine? Side effects that you should report to your doctor or health care professional as soon as possible: -allergic reactions like skin rash, itching or hives, swelling of the face, lips, or tongue -worsening of mood, thoughts or actions of suicide or dying Side effects that usually do not require medical attention (report to your doctor or health care professional if they continue or are bothersome): -constipation -difficulty walking or controlling muscle movements -dizziness -nausea -slurred  speech -tiredness -tremors -weight gain This list may not describe all possible side effects. Call your doctor for medical advice about side effects. You may report side effects to FDA at 1-800-FDA-1088. Where should I keep my medicine? Keep out of reach of children. This medicine may cause accidental overdose and death if it taken by other adults, children, or pets. Mix any unused medicine with a substance like cat litter or coffee grounds. Then throw the medicine away in a sealed container like a sealed bag or a coffee can with a lid. Do not use the medicine after the expiration date. Store at room temperature between 15 and 30 degrees C (59 and 86 degrees F). NOTE: This sheet is a summary. It may not cover all possible information. If you have questions about this medicine, talk to your doctor, pharmacist, or health care provider.  2018 Elsevier/Gold Standard (2013-07-20 15:26:50)

## 2016-11-30 NOTE — Assessment & Plan Note (Addendum)
Chronic. Improved. Bilateral up to knee. No signs of DVT. Likely due to chronic kidney disease. --Discussed with patient importance of shifting amount of fluid and salt intake --Keep feet elevated when sleeping --Could consider compression stockings during next visit if persists

## 2016-11-30 NOTE — Assessment & Plan Note (Addendum)
Acute. Localized to left big toe. Unusual presentation though history insistent with probable neuropathy. No signs of rash or gout. Creatinine clearance 17. --Will initiate gabapentin 200 mg daily, renally dosed, can go as high as 300 mg daily if needed --We'll consider alternative medication like Lyrica if not improved --RTC 3 months

## 2016-11-30 NOTE — Assessment & Plan Note (Signed)
Chronic. Stable. A1c 5.9. Complaining of new onset foot pain consistent with peripheral neuropathy. --See note for foot pain --Patient no longer requiring this checks due to controlled diabetes without insulin use

## 2016-12-01 ENCOUNTER — Encounter (HOSPITAL_COMMUNITY)
Admission: RE | Admit: 2016-12-01 | Discharge: 2016-12-01 | Disposition: A | Discharge status: Home / Self Care | Payer: Medicare HMO | Source: Ambulatory Visit | Attending: Endocrinology | Admitting: Endocrinology

## 2016-12-01 DIAGNOSIS — N184 Chronic kidney disease, stage 4 (severe): Secondary | ICD-10-CM

## 2016-12-01 LAB — POCT HEMOGLOBIN-HEMACUE: Hemoglobin: 10.4 g/dL — ABNORMAL LOW (ref 13.0–17.0)

## 2016-12-01 MED ORDER — EPOETIN ALFA 10000 UNIT/ML IJ SOLN
INTRAMUSCULAR | Status: AC
  Filled 2016-12-01: qty 1

## 2016-12-01 MED ORDER — EPOETIN ALFA 10000 UNIT/ML IJ SOLN
10000.0000 [IU] | INTRAMUSCULAR | Status: DC
  Administered 2016-12-01: 10000 [IU] via SUBCUTANEOUS

## 2016-12-09 ENCOUNTER — Telehealth: Payer: Self-pay | Admitting: Family Medicine

## 2016-12-09 NOTE — Telephone Encounter (Signed)
Contacted patient. Came to an agreement to not take gabapentin. Patient to schedule follow-up appointment if neuropathy continues to bother him. -- Harriet Butte, [place], PGY-2

## 2016-12-09 NOTE — Telephone Encounter (Signed)
He was told to stop taking the gabapentin and so is wondering if he does not need to pick up his prescription from [facility] then ??   Please let him know what to do, thanks.  # [id number].

## 2016-12-15 ENCOUNTER — Encounter (HOSPITAL_COMMUNITY)
Admission: RE | Admit: 2016-12-15 | Discharge: 2016-12-15 | Disposition: A | Discharge status: Home / Self Care | Payer: Medicare HMO | Source: Ambulatory Visit | Attending: Nephrology | Admitting: Nephrology

## 2016-12-15 DIAGNOSIS — Z5181 Encounter for therapeutic drug level monitoring: Secondary | ICD-10-CM | POA: Insufficient documentation

## 2016-12-15 DIAGNOSIS — Z79899 Other long term (current) drug therapy: Secondary | ICD-10-CM | POA: Insufficient documentation

## 2016-12-15 DIAGNOSIS — D631 Anemia in chronic kidney disease: Secondary | ICD-10-CM | POA: Insufficient documentation

## 2016-12-15 DIAGNOSIS — N184 Chronic kidney disease, stage 4 (severe): Secondary | ICD-10-CM | POA: Insufficient documentation

## 2016-12-15 LAB — MAGNESIUM: Magnesium: 2.4 mg/dL (ref 1.7–2.4)

## 2016-12-15 LAB — IRON AND TIBC
Iron: 93 ug/dL (ref 45–182)
Saturation Ratios: 35 % (ref 17.9–39.5)
TIBC: 269 ug/dL (ref 250–450)
UIBC: 176 ug/dL

## 2016-12-15 LAB — RENAL FUNCTION PANEL
ANION GAP: 10 (ref 5–15)
Albumin: 3.6 g/dL (ref 3.5–5.0)
BUN: 59 mg/dL — ABNORMAL HIGH (ref 6–20)
CHLORIDE: 111 mmol/L (ref 101–111)
CO2: 21 mmol/L — AB (ref 22–32)
Calcium: 8.9 mg/dL (ref 8.9–10.3)
Creatinine, Ser: 2.96 mg/dL — ABNORMAL HIGH (ref 0.61–1.24)
GFR calc non Af Amer: 18 mL/min — ABNORMAL LOW (ref >60)
GFR, EST AFRICAN AMERICAN: 21 mL/min — AB (ref >60)
Glucose, Bld: 106 mg/dL — ABNORMAL HIGH (ref 65–99)
Phosphorus: 5.2 mg/dL — ABNORMAL HIGH (ref 2.5–4.6)
Potassium: 4.5 mmol/L (ref 3.5–5.1)
Sodium: 142 mmol/L (ref 135–145)

## 2016-12-15 LAB — POCT HEMOGLOBIN-HEMACUE: Hemoglobin: 10.2 g/dL — ABNORMAL LOW (ref 13.0–17.0)

## 2016-12-15 LAB — FERRITIN: FERRITIN: 131 ng/mL (ref 24–336)

## 2016-12-15 MED ORDER — EPOETIN ALFA 10000 UNIT/ML IJ SOLN
INTRAMUSCULAR | Status: AC
  Filled 2016-12-15: qty 1

## 2016-12-15 MED ORDER — EPOETIN ALFA 10000 UNIT/ML IJ SOLN
10000.0000 [IU] | INTRAMUSCULAR | Status: DC
  Administered 2016-12-15: 10000 [IU] via SUBCUTANEOUS

## 2016-12-17 DIAGNOSIS — R339 Retention of urine, unspecified: Secondary | ICD-10-CM | POA: Diagnosis not present

## 2016-12-23 DIAGNOSIS — N401 Enlarged prostate with lower urinary tract symptoms: Secondary | ICD-10-CM | POA: Diagnosis not present

## 2016-12-23 DIAGNOSIS — R339 Retention of urine, unspecified: Secondary | ICD-10-CM | POA: Diagnosis not present

## 2016-12-29 ENCOUNTER — Encounter (HOSPITAL_COMMUNITY)
Admission: RE | Admit: 2016-12-29 | Discharge: 2016-12-29 | Disposition: A | Discharge status: Home / Self Care | Payer: Medicare HMO | Source: Ambulatory Visit | Attending: Nephrology | Admitting: Nephrology

## 2016-12-29 DIAGNOSIS — N184 Chronic kidney disease, stage 4 (severe): Secondary | ICD-10-CM | POA: Diagnosis not present

## 2016-12-29 LAB — POCT HEMOGLOBIN-HEMACUE: HEMOGLOBIN: 9.7 g/dL — AB (ref 13.0–17.0)

## 2016-12-29 MED ORDER — EPOETIN ALFA 10000 UNIT/ML IJ SOLN
INTRAMUSCULAR | Status: AC
  Filled 2016-12-29: qty 1

## 2016-12-29 MED ORDER — EPOETIN ALFA 10000 UNIT/ML IJ SOLN
10000.0000 [IU] | INTRAMUSCULAR | Status: DC
  Administered 2016-12-29: 10000 [IU] via SUBCUTANEOUS

## 2017-01-12 ENCOUNTER — Encounter (HOSPITAL_COMMUNITY)
Admission: RE | Admit: 2017-01-12 | Discharge: 2017-01-12 | Disposition: A | Discharge status: Home / Self Care | Payer: Medicare HMO | Source: Ambulatory Visit | Attending: Nephrology | Admitting: Nephrology

## 2017-01-12 DIAGNOSIS — Z79899 Other long term (current) drug therapy: Secondary | ICD-10-CM | POA: Insufficient documentation

## 2017-01-12 DIAGNOSIS — D631 Anemia in chronic kidney disease: Secondary | ICD-10-CM | POA: Diagnosis not present

## 2017-01-12 DIAGNOSIS — Z5181 Encounter for therapeutic drug level monitoring: Secondary | ICD-10-CM | POA: Insufficient documentation

## 2017-01-12 DIAGNOSIS — N184 Chronic kidney disease, stage 4 (severe): Secondary | ICD-10-CM | POA: Diagnosis not present

## 2017-01-12 LAB — RENAL FUNCTION PANEL
ALBUMIN: 3.7 g/dL (ref 3.5–5.0)
ANION GAP: 13 (ref 5–15)
BUN: 71 mg/dL — ABNORMAL HIGH (ref 6–20)
CALCIUM: 9.1 mg/dL (ref 8.9–10.3)
CO2: 18 mmol/L — AB (ref 22–32)
Chloride: 109 mmol/L (ref 101–111)
Creatinine, Ser: 3.43 mg/dL — ABNORMAL HIGH (ref 0.61–1.24)
GFR calc non Af Amer: 15 mL/min — ABNORMAL LOW (ref >60)
GFR, EST AFRICAN AMERICAN: 18 mL/min — AB (ref >60)
Glucose, Bld: 105 mg/dL — ABNORMAL HIGH (ref 65–99)
PHOSPHORUS: 5.3 mg/dL — AB (ref 2.5–4.6)
Potassium: 4.1 mmol/L (ref 3.5–5.1)
SODIUM: 140 mmol/L (ref 135–145)

## 2017-01-12 LAB — IRON AND TIBC
Iron: 90 ug/dL (ref 45–182)
SATURATION RATIOS: 32 % (ref 17.9–39.5)
TIBC: 286 ug/dL (ref 250–450)
UIBC: 196 ug/dL

## 2017-01-12 LAB — FERRITIN: Ferritin: 179 ng/mL (ref 24–336)

## 2017-01-12 LAB — POCT HEMOGLOBIN-HEMACUE: Hemoglobin: 9.6 g/dL — ABNORMAL LOW (ref 13.0–17.0)

## 2017-01-12 LAB — MAGNESIUM: Magnesium: 2.7 mg/dL — ABNORMAL HIGH (ref 1.7–2.4)

## 2017-01-12 MED ORDER — EPOETIN ALFA 10000 UNIT/ML IJ SOLN
INTRAMUSCULAR | Status: AC
  Filled 2017-01-12: qty 1

## 2017-01-12 MED ORDER — EPOETIN ALFA 10000 UNIT/ML IJ SOLN
10000.0000 [IU] | INTRAMUSCULAR | Status: DC
  Administered 2017-01-12: 10000 [IU] via SUBCUTANEOUS

## 2017-01-13 DIAGNOSIS — H02401 Unspecified ptosis of right eyelid: Secondary | ICD-10-CM | POA: Diagnosis not present

## 2017-01-13 DIAGNOSIS — H524 Presbyopia: Secondary | ICD-10-CM | POA: Diagnosis not present

## 2017-01-13 DIAGNOSIS — E119 Type 2 diabetes mellitus without complications: Secondary | ICD-10-CM | POA: Diagnosis not present

## 2017-01-13 DIAGNOSIS — H2513 Age-related nuclear cataract, bilateral: Secondary | ICD-10-CM | POA: Diagnosis not present

## 2017-01-13 DIAGNOSIS — H52203 Unspecified astigmatism, bilateral: Secondary | ICD-10-CM | POA: Diagnosis not present

## 2017-01-13 LAB — HM DIABETES EYE EXAM

## 2017-01-17 ENCOUNTER — Encounter: Payer: Self-pay | Admitting: Family Medicine

## 2017-01-17 DIAGNOSIS — H269 Unspecified cataract: Secondary | ICD-10-CM | POA: Insufficient documentation

## 2017-01-24 DIAGNOSIS — I129 Hypertensive chronic kidney disease with stage 1 through stage 4 chronic kidney disease, or unspecified chronic kidney disease: Secondary | ICD-10-CM | POA: Diagnosis not present

## 2017-01-24 DIAGNOSIS — N2581 Secondary hyperparathyroidism of renal origin: Secondary | ICD-10-CM | POA: Diagnosis not present

## 2017-01-24 DIAGNOSIS — R339 Retention of urine, unspecified: Secondary | ICD-10-CM | POA: Diagnosis not present

## 2017-01-24 DIAGNOSIS — N184 Chronic kidney disease, stage 4 (severe): Secondary | ICD-10-CM | POA: Diagnosis not present

## 2017-01-24 DIAGNOSIS — D631 Anemia in chronic kidney disease: Secondary | ICD-10-CM | POA: Diagnosis not present

## 2017-01-26 ENCOUNTER — Encounter (HOSPITAL_COMMUNITY): Payer: Medicare HMO

## 2017-01-26 DIAGNOSIS — R339 Retention of urine, unspecified: Secondary | ICD-10-CM | POA: Diagnosis not present

## 2017-01-26 DIAGNOSIS — N3000 Acute cystitis without hematuria: Secondary | ICD-10-CM | POA: Diagnosis not present

## 2017-01-26 DIAGNOSIS — N401 Enlarged prostate with lower urinary tract symptoms: Secondary | ICD-10-CM | POA: Diagnosis not present

## 2017-01-31 ENCOUNTER — Encounter (HOSPITAL_COMMUNITY)
Admission: RE | Admit: 2017-01-31 | Discharge: 2017-01-31 | Disposition: A | Discharge status: Home / Self Care | Payer: Medicare HMO | Source: Ambulatory Visit | Attending: Nephrology | Admitting: Nephrology

## 2017-01-31 DIAGNOSIS — N184 Chronic kidney disease, stage 4 (severe): Secondary | ICD-10-CM

## 2017-01-31 LAB — POCT HEMOGLOBIN-HEMACUE: HEMOGLOBIN: 11.2 g/dL — AB (ref 13.0–17.0)

## 2017-01-31 MED ORDER — EPOETIN ALFA 10000 UNIT/ML IJ SOLN
INTRAMUSCULAR | Status: AC
  Filled 2017-01-31: qty 1

## 2017-01-31 MED ORDER — EPOETIN ALFA 10000 UNIT/ML IJ SOLN
10000.0000 [IU] | INTRAMUSCULAR | Status: DC
  Administered 2017-01-31: 10000 [IU] via SUBCUTANEOUS

## 2017-02-22 ENCOUNTER — Encounter (HOSPITAL_COMMUNITY): Payer: Medicare HMO

## 2017-02-25 ENCOUNTER — Other Ambulatory Visit (HOSPITAL_COMMUNITY): Payer: Self-pay | Admitting: *Deleted

## 2017-02-28 ENCOUNTER — Encounter (HOSPITAL_COMMUNITY)
Admission: RE | Admit: 2017-02-28 | Discharge: 2017-02-28 | Disposition: A | Discharge status: Home / Self Care | Payer: Medicare HMO | Source: Ambulatory Visit | Attending: Nephrology | Admitting: Nephrology

## 2017-02-28 DIAGNOSIS — Z79899 Other long term (current) drug therapy: Secondary | ICD-10-CM | POA: Diagnosis not present

## 2017-02-28 DIAGNOSIS — D631 Anemia in chronic kidney disease: Secondary | ICD-10-CM | POA: Diagnosis not present

## 2017-02-28 DIAGNOSIS — R339 Retention of urine, unspecified: Secondary | ICD-10-CM | POA: Diagnosis not present

## 2017-02-28 DIAGNOSIS — Z5181 Encounter for therapeutic drug level monitoring: Secondary | ICD-10-CM | POA: Diagnosis not present

## 2017-02-28 DIAGNOSIS — N184 Chronic kidney disease, stage 4 (severe): Secondary | ICD-10-CM | POA: Insufficient documentation

## 2017-02-28 LAB — RENAL FUNCTION PANEL
ANION GAP: 9 (ref 5–15)
Albumin: 3.9 g/dL (ref 3.5–5.0)
BUN: 54 mg/dL — ABNORMAL HIGH (ref 6–20)
CALCIUM: 9.3 mg/dL (ref 8.9–10.3)
CO2: 22 mmol/L (ref 22–32)
Chloride: 110 mmol/L (ref 101–111)
Creatinine, Ser: 3.52 mg/dL — ABNORMAL HIGH (ref 0.61–1.24)
GFR calc Af Amer: 17 mL/min — ABNORMAL LOW (ref >60)
GFR calc non Af Amer: 15 mL/min — ABNORMAL LOW (ref >60)
GLUCOSE: 100 mg/dL — AB (ref 65–99)
Phosphorus: 4.8 mg/dL — ABNORMAL HIGH (ref 2.5–4.6)
Potassium: 3.7 mmol/L (ref 3.5–5.1)
SODIUM: 141 mmol/L (ref 135–145)

## 2017-02-28 LAB — IRON AND TIBC
Iron: 62 ug/dL (ref 45–182)
SATURATION RATIOS: 21 % (ref 17.9–39.5)
TIBC: 294 ug/dL (ref 250–450)
UIBC: 232 ug/dL

## 2017-02-28 LAB — POCT HEMOGLOBIN-HEMACUE: HEMOGLOBIN: 10.3 g/dL — AB (ref 13.0–17.0)

## 2017-02-28 LAB — FERRITIN: Ferritin: 166 ng/mL (ref 24–336)

## 2017-02-28 LAB — MAGNESIUM: MAGNESIUM: 2.3 mg/dL (ref 1.7–2.4)

## 2017-02-28 MED ORDER — EPOETIN ALFA 10000 UNIT/ML IJ SOLN
INTRAMUSCULAR | Status: AC
  Filled 2017-02-28: qty 1

## 2017-02-28 MED ORDER — EPOETIN ALFA 10000 UNIT/ML IJ SOLN
10000.0000 [IU] | INTRAMUSCULAR | Status: DC
Start: 2017-02-28 — End: 2017-03-01
  Administered 2017-02-28: 10000 [IU] via SUBCUTANEOUS

## 2017-03-01 DIAGNOSIS — R339 Retention of urine, unspecified: Secondary | ICD-10-CM | POA: Diagnosis not present

## 2017-03-02 NOTE — Progress Notes (Signed)
   Subjective:   Patient ID: Richard Moses    DOB: 08-25-1932, 83 y.o. male   MRN: [id number]  CC: "Memory disturbance"  HPI: Richard Moses is a 83 y.o. male who presents to clinic today for memory disturbance. Problems discussed today are as follows:  Memory disturbance: Ongoing issue for approximately 1 year. Has been forgetting names and tasks while at home. Lives at home with friend. Patient currently drives. He denies family history of dementia. ROS: Denies loss of sensation or motor function, feelings of fatigue, change in vision.  Left arm pain: Has been dispensing intermittent arm pain with a dull sensation. She notices this when he wakes up in the morning which is improved to 3 positioning. He has not tried conservative management. ROS: Denies fevers or chills, chest pain, diaphoresis, shortness of breath, palpitations, orthopnea.  Bilateral knee pain: Patient has been having difficulty walking long distances and recently tripped on the sidewalk at a local store. He did not sustain any head trauma but feels that he needs a handicap sticker. Patient is without a walker or cane at home but does not feel like he needs ambulatory assistance.  Complete ROS performed, see HPI for pertinent.  [place]: HTN, NIDDM, CKDIV, BPH w/ urinary retention, anemia of chronic disease, protein calorie malnutrition. Surgical history rhinoplasty x2, melanoma excision L-neck. Family history renal CA, brain CA. Smoking status reviewed. Medications reviewed.  Objective:   BP 120/60   Pulse 74   Temp 98.3 F (36.8 C) (Oral)   Ht 5\' 11"  (1.803 m)   Wt 154 lb (69.9 kg)   SpO2 99%   BMI 21.48 kg/m  Vitals and nursing note reviewed.  General: frail elderly male, in no acute distress with non-toxic appearance HEENT: normocephalic, atraumatic, moist mucous membranes, pink conjunctiva, pink tongue Neck: supple, non-tender without lymphadenopathy CV: regular rate and rhythm without murmurs, rubs, or  gallops, no lower extremity edema Lungs: clear to auscultation bilaterally with normal work of breathing Skin: warm, dry, no rashes or lesions, cap refill < 2 seconds Extremities: warm and well perfused, normal tone, no tenderness to left arm on palpation, full active ROM  Assessment & Plan:   Left arm pain Acute. Seems to be provoked by sleeping position. Low suspicion for cardiac given improvement with repositioning. Pt has not attempted conservative management. --Recommending patient avoid sleeping on left side and use tylenol as needed  Memory disturbance Chronic. Ongoing for 1 year. Concerning for cognitive impairment vs dementia. --Referral to geriatric clinic placed  Bilateral knee pain Chronic. Likely arthritis. No concern for ligamentous injury. Only problematic with long distance ambulation. --Handicap place card form filled --Recommended tylenol as need for pain  Orders Placed This Encounter  Procedures  . Flu Vaccine QUAD 36+ mos IM   No orders of the defined types were placed in this encounter.   Harriet Butte, [place], PGY-2 03/03/2017 1:39 PM

## 2017-03-03 ENCOUNTER — Encounter: Payer: Self-pay | Admitting: Family Medicine

## 2017-03-03 ENCOUNTER — Ambulatory Visit (INDEPENDENT_AMBULATORY_CARE_PROVIDER_SITE_OTHER): Payer: Medicare HMO | Admitting: Family Medicine

## 2017-03-03 DIAGNOSIS — M25561 Pain in right knee: Secondary | ICD-10-CM | POA: Insufficient documentation

## 2017-03-03 DIAGNOSIS — Z23 Encounter for immunization: Secondary | ICD-10-CM

## 2017-03-03 DIAGNOSIS — M79602 Pain in left arm: Secondary | ICD-10-CM | POA: Diagnosis not present

## 2017-03-03 DIAGNOSIS — M25562 Pain in left knee: Secondary | ICD-10-CM | POA: Diagnosis not present

## 2017-03-03 DIAGNOSIS — R413 Other amnesia: Secondary | ICD-10-CM | POA: Insufficient documentation

## 2017-03-03 DIAGNOSIS — G8929 Other chronic pain: Secondary | ICD-10-CM

## 2017-03-03 NOTE — Assessment & Plan Note (Addendum)
Chronic. Likely arthritis. No concern for ligamentous injury. Only problematic with long distance ambulation. --Handicap place card form filled --Recommended tylenol as need for pain

## 2017-03-03 NOTE — Assessment & Plan Note (Addendum)
Acute. Seems to be provoked by sleeping position. Low suspicion for cardiac given improvement with repositioning. Pt has not attempted conservative management. --Recommending patient avoid sleeping on left side and use tylenol as needed

## 2017-03-03 NOTE — Patient Instructions (Signed)
Thank you for coming in to see [place] today. Please see below to review our plan for today's visit.  1. Take the form you brought to the [facility] to get your handicap placement. 2. I would like you to see our geriatric clinic for further evaluation of your memory disturbance. We can set this up for you at the clinic. 3. I would recommend you avoiding sleeping on her left side to prevent your arm pain. You can take Tylenol as needed if he becomes aggravating. If you develop shortness of breath, sweating, or your pain is more localized to the chest or jaw, then you need to seek immediate medical care.  Please call the clinic at [phone] if your symptoms worsen or you have any concerns. It was my pleasure to see you. -- Harriet Butte, [place], PGY-2

## 2017-03-03 NOTE — Assessment & Plan Note (Addendum)
Chronic. Ongoing for 1 year. Concerning for cognitive impairment vs dementia. --Referral to geriatric clinic placed

## 2017-03-07 ENCOUNTER — Encounter: Payer: Self-pay | Admitting: Family Medicine

## 2017-03-07 ENCOUNTER — Encounter (HOSPITAL_COMMUNITY): Payer: Medicare HMO

## 2017-03-09 DIAGNOSIS — R339 Retention of urine, unspecified: Secondary | ICD-10-CM | POA: Diagnosis not present

## 2017-03-14 ENCOUNTER — Encounter (HOSPITAL_COMMUNITY)
Admission: RE | Admit: 2017-03-14 | Discharge: 2017-03-14 | Disposition: A | Discharge status: Home / Self Care | Payer: Medicare HMO | Source: Ambulatory Visit | Attending: Nephrology | Admitting: Nephrology

## 2017-03-14 DIAGNOSIS — Z79899 Other long term (current) drug therapy: Secondary | ICD-10-CM | POA: Diagnosis not present

## 2017-03-14 DIAGNOSIS — N184 Chronic kidney disease, stage 4 (severe): Secondary | ICD-10-CM | POA: Diagnosis present

## 2017-03-14 DIAGNOSIS — D631 Anemia in chronic kidney disease: Secondary | ICD-10-CM | POA: Diagnosis not present

## 2017-03-14 DIAGNOSIS — Z5181 Encounter for therapeutic drug level monitoring: Secondary | ICD-10-CM | POA: Insufficient documentation

## 2017-03-14 MED ORDER — EPOETIN ALFA 10000 UNIT/ML IJ SOLN
10000.0000 [IU] | INTRAMUSCULAR | Status: DC
Start: 2017-03-14 — End: 2017-03-15
  Administered 2017-03-14: 10000 [IU] via SUBCUTANEOUS

## 2017-03-14 MED ORDER — EPOETIN ALFA 10000 UNIT/ML IJ SOLN
INTRAMUSCULAR | Status: AC
  Filled 2017-03-14: qty 1

## 2017-03-15 LAB — POCT HEMOGLOBIN-HEMACUE: HEMOGLOBIN: 10.1 g/dL — AB (ref 13.0–17.0)

## 2017-03-20 ENCOUNTER — Emergency Department (HOSPITAL_COMMUNITY)
Admission: EM | Admit: 2017-03-20 | Discharge: 2017-03-20 | Disposition: A | Discharge status: Home / Self Care | Payer: Medicare HMO | Attending: Emergency Medicine | Admitting: Emergency Medicine

## 2017-03-20 ENCOUNTER — Encounter (HOSPITAL_COMMUNITY): Payer: Self-pay | Admitting: Emergency Medicine

## 2017-03-20 DIAGNOSIS — Z79899 Other long term (current) drug therapy: Secondary | ICD-10-CM | POA: Insufficient documentation

## 2017-03-20 DIAGNOSIS — N3001 Acute cystitis with hematuria: Secondary | ICD-10-CM

## 2017-03-20 DIAGNOSIS — R339 Retention of urine, unspecified: Secondary | ICD-10-CM

## 2017-03-20 DIAGNOSIS — E039 Hypothyroidism, unspecified: Secondary | ICD-10-CM | POA: Insufficient documentation

## 2017-03-20 DIAGNOSIS — N184 Chronic kidney disease, stage 4 (severe): Secondary | ICD-10-CM | POA: Diagnosis not present

## 2017-03-20 DIAGNOSIS — Z85828 Personal history of other malignant neoplasm of skin: Secondary | ICD-10-CM | POA: Insufficient documentation

## 2017-03-20 DIAGNOSIS — Z87891 Personal history of nicotine dependence: Secondary | ICD-10-CM | POA: Diagnosis not present

## 2017-03-20 DIAGNOSIS — E1122 Type 2 diabetes mellitus with diabetic chronic kidney disease: Secondary | ICD-10-CM | POA: Insufficient documentation

## 2017-03-20 DIAGNOSIS — I129 Hypertensive chronic kidney disease with stage 1 through stage 4 chronic kidney disease, or unspecified chronic kidney disease: Secondary | ICD-10-CM | POA: Insufficient documentation

## 2017-03-20 LAB — URINALYSIS, ROUTINE W REFLEX MICROSCOPIC
Bilirubin Urine: NEGATIVE
Glucose, UA: NEGATIVE mg/dL
Ketones, ur: NEGATIVE mg/dL
Nitrite: NEGATIVE
Protein, ur: 100 mg/dL — AB
Specific Gravity, Urine: 1.011 (ref 1.005–1.030)
Squamous Epithelial / LPF: NONE SEEN
pH: 7 (ref 5.0–8.0)

## 2017-03-20 LAB — I-STAT CHEM 8, ED
BUN: 65 mg/dL — AB (ref 6–20)
CHLORIDE: 113 mmol/L — AB (ref 101–111)
Calcium, Ion: 1.22 mmol/L (ref 1.15–1.40)
Creatinine, Ser: 4.2 mg/dL — ABNORMAL HIGH (ref 0.61–1.24)
Glucose, Bld: 143 mg/dL — ABNORMAL HIGH (ref 65–99)
HEMATOCRIT: 26 % — AB (ref 39.0–52.0)
Hemoglobin: 8.8 g/dL — ABNORMAL LOW (ref 13.0–17.0)
Potassium: 4.1 mmol/L (ref 3.5–5.1)
SODIUM: 142 mmol/L (ref 135–145)
TCO2: 17 mmol/L — AB (ref 22–32)

## 2017-03-20 LAB — CBC WITH DIFFERENTIAL/PLATELET
BASOS PCT: 0 %
Basophils Absolute: 0 10*3/uL (ref 0.0–0.1)
EOS ABS: 0 10*3/uL (ref 0.0–0.7)
Eosinophils Relative: 0 %
HEMATOCRIT: 29.6 % — AB (ref 39.0–52.0)
HEMOGLOBIN: 9.6 g/dL — AB (ref 13.0–17.0)
Lymphocytes Relative: 5 %
Lymphs Abs: 0.4 10*3/uL — ABNORMAL LOW (ref 0.7–4.0)
MCH: 32.2 pg (ref 26.0–34.0)
MCHC: 32.4 g/dL (ref 30.0–36.0)
MCV: 99.3 fL (ref 78.0–100.0)
MONOS PCT: 9 %
Monocytes Absolute: 0.7 10*3/uL (ref 0.1–1.0)
NEUTROS ABS: 7.1 10*3/uL (ref 1.7–7.7)
NEUTROS PCT: 86 %
Platelets: 216 10*3/uL (ref 150–400)
RBC: 2.98 MIL/uL — AB (ref 4.22–5.81)
RDW: 13.4 % (ref 11.5–15.5)
WBC: 8.2 10*3/uL (ref 4.0–10.5)

## 2017-03-20 MED ORDER — AMOXICILLIN-POT CLAVULANATE 875-125 MG PO TABS
1.0000 | ORAL_TABLET | Freq: Once | ORAL | Status: AC
  Administered 2017-03-20: 1 via ORAL
  Filled 2017-03-20: qty 1

## 2017-03-20 MED ORDER — SODIUM CHLORIDE 0.9 % IV BOLUS (SEPSIS)
1000.0000 mL | Freq: Once | INTRAVENOUS | Status: AC
  Administered 2017-03-20: 1000 mL via INTRAVENOUS

## 2017-03-20 MED ORDER — AMOXICILLIN-POT CLAVULANATE 875-125 MG PO TABS: 1.0000 | ORAL_TABLET | Freq: Two times a day (BID) | ORAL | 0 refills | Status: DC

## 2017-03-20 NOTE — ED Provider Notes (Signed)
[facility] DEPT Provider Note   CSN: [id number] Arrival date & time: 03/20/17  1152     History   Chief Complaint Chief Complaint  Patient presents with  . Urinary Retention    HPI Richard Moses is a 83 y.o. male with history of BPH with urinary retention and chronic catheter, CK D, diabetes presents to the ED for evaluation of urinary retention 2 days associated with suprapubic abdominal discomfort hematuria, dysuria, itching inside the penis for the last 7 days. Denies fevers, chills, nausea, vomiting, flank pain. Patient states his symptoms today are typical of previous UTIs.Denies recent falls, groin numbness, lower extremity weakness or numbness. Chart review reveals patient had catheter changed on 10/3, scheduled to return for catheter change next month.states his last UTI was approximately one month ago.  HPI  Past Medical History:  Diagnosis Date  . Abnormal MRI, cervical spine 09/29/10   Mildly abnormal MRI cervical spine demonstrating mild spondylosis and disc bulging from C4-5 down to C7-T1.  No spinal stenosis or foraminal narrowing  . Anemia   . Cervical disc herniation 09/29/10   C4-C5 and C7-T1  . Chronic kidney disease (CKD)   . Chronic left-sided headaches   . Colon polyp 2007  . Diabetes mellitus   . External hemorrhoids without mention of complication 3382  . Hyperlipidemia   . Hypertension   . Hypothyroidism 09/29/10   Untreated. Patient not interested in Rx.   . Knee pain, bilateral 02/05/2013   Pain since a fall in 2014  . MRI of brain abnormal 09/29/10   Abnormal MRI of brain, demonstrating mild atrophy  . Neck pain on left side 2012    Patient Active Problem List   Diagnosis Date Noted  . Left arm pain 03/03/2017  . Memory disturbance 03/03/2017  . Bilateral knee pain 03/03/2017  . Bilateral cataracts 01/17/2017  . Peripheral neuropathy 11/30/2016  . Anemia of chronic disease 07/14/2016  . Mass of soft tissue 06/04/2016  . Protein-calorie  malnutrition ([place]) 06/04/2016  . BPH (benign prostatic hypertrophy) with urinary obstruction 07/11/2014  . UTI (urinary tract infection) 06/11/2014  . Bilateral hydronephrosis 04/26/2014  . Incomplete bladder emptying 04/26/2014  . Urinary retention 04/22/2014  . Chronic kidney disease (CKD), stage IV (severe) ([place]) 04/17/2014  . Dysphagia, pharyngoesophageal phase 09/11/2012  . Primary hypertension 12/25/2009  . Elevated PSA 05/20/2009  . Diet-controlled diabetes mellitus ([place]) 02/18/2009    Past Surgical History:  Procedure Laterality Date  . MELANOMA EXCISION     left side of neck  . RHINOPLASTY     x2        Home Medications    Prior to Admission medications   Medication Sig Start Date End Date Taking? Authorizing Provider  Barberry-Oreg Grape-Goldenseal (BERBERINE COMPLEX PO) Take 500 mg by mouth 3 (three) times daily with meals. Reported on 12/11/2015   Yes [provider]  ferrous sulfate 324 (65 Fe) MG TBEC Take by mouth.   Yes [provider]  Multiple Vitamins-Minerals (MENS MULTIVITAMIN PLUS) TABS Take 1 tablet by mouth daily after breakfast.   Yes [provider]  amoxicillin-clavulanate (AUGMENTIN) 875-125 MG tablet Take 1 tablet by mouth every 12 (twelve) hours. 03/20/17   Kinnie Feil, PA-C  gabapentin (NEURONTIN) 100 MG capsule Take 1 capsule (100 mg total) by mouth 2 (two) times daily. Patient not taking: Reported on 03/20/2017 11/30/16   [place] Bing, DO  polyethylene glycol [employer] / GLYCOLAX) packet Take 17 g by mouth daily. Patient not  taking: Reported on 03/20/2017 08/20/16   Smiley Houseman, MD    Family History Family History  Problem Relation Age of Onset  . Cancer Father 75       Renal CA  . Cancer Sister        Brain   . Cancer Brother        Brain  . Colon cancer Neg Hx   . Stomach cancer Neg Hx     Social History Social History  Substance Use Topics  . Smoking status: Former Smoker    Types:  Cigarettes    Quit date: 12/06/1966  . Smokeless tobacco: Never Used  . Alcohol use 0.0 oz/week     Comment: occasionally - had cut back since diabetes     Allergies   Patient has no known allergies.   Review of Systems Review of Systems  Constitutional: Negative for chills, diaphoresis and fever.  Respiratory: Negative for cough and shortness of breath.   Cardiovascular: Negative for chest pain.  Gastrointestinal: Positive for abdominal pain. Negative for constipation, diarrhea, nausea and vomiting.  Genitourinary: Positive for decreased urine volume, difficulty urinating, dysuria, hematuria and urgency. Negative for discharge, flank pain, penile pain, penile swelling and testicular pain.  Musculoskeletal: Negative for back pain.  Skin: Negative for color change.  Neurological: Negative for weakness and numbness.     Physical Exam Updated Vital Signs BP (!) 178/89 (BP Location: Right Arm)   Pulse (!) 123   Temp 98 F (36.7 C) (Oral)   SpO2 99%   Physical Exam  Constitutional: He is oriented to person, place, and time. He appears well-developed and well-nourished. No distress.  HENT:  Head: Normocephalic and atraumatic.  Nose: Nose normal.  Mouth/Throat: No oropharyngeal exudate.  Dry lips but moist mucous membranes  Eyes: Pupils are equal, round, and reactive to light. Conjunctivae and EOM are normal.  Neck: Normal range of motion. Neck supple.  Cardiovascular: Normal rate, regular rhythm, normal heart sounds and intact distal pulses.   No murmur heard. Pulmonary/Chest: Effort normal and breath sounds normal. No respiratory distress. He has no wheezes. He has no rales.  Abdominal: Soft. Bowel sounds are normal. He exhibits no distension. There is tenderness.  Suprapubic tenderness No CVAT Negative McBurney's and Murphy's No G/R/R  Genitourinary:  Genitourinary Comments: Catheter in place, urine is cloudy w/o gross hematuria No penile or testicular tenderness No  discharge or skin changes in external genitalia   Musculoskeletal: Normal range of motion. He exhibits no deformity.  Lymphadenopathy:    He has no cervical adenopathy.  Neurological: He is alert and oriented to person, place, and time.  Skin: Skin is warm and dry. Capillary refill takes less than 2 seconds.  Psychiatric: He has a normal mood and affect. His behavior is normal. Judgment and thought content normal.  Nursing note and vitals reviewed.    ED Treatments / Results  Labs (all labs ordered are listed, but only abnormal results are displayed) Labs Reviewed  URINALYSIS, ROUTINE W REFLEX MICROSCOPIC - Abnormal; Notable for the following:       Result Value   APPearance TURBID (*)    Hgb urine dipstick SMALL (*)    Protein, ur 100 (*)    Leukocytes, UA LARGE (*)    Bacteria, UA RARE (*)    All other components within normal limits  CBC WITH DIFFERENTIAL/PLATELET - Abnormal; Notable for the following:    RBC 2.98 (*)    Hemoglobin 9.6 (*)  HCT 29.6 (*)    Lymphs Abs 0.4 (*)    All other components within normal limits  I-STAT CHEM 8, ED - Abnormal; Notable for the following:    Chloride 113 (*)    BUN 65 (*)    Creatinine, Ser 4.20 (*)    Glucose, Bld 143 (*)    TCO2 17 (*)    Hemoglobin 8.8 (*)    HCT 26.0 (*)    All other components within normal limits  URINE CULTURE    EKG  EKG Interpretation None       Radiology No results found.  Procedures Procedures (including critical care time)  Medications Ordered in ED Medications  sodium chloride 0.9 % bolus 1,000 mL (1,000 mLs Intravenous New Bag/Given 03/20/17 1409)  amoxicillin-clavulanate (AUGMENTIN) 875-125 MG per tablet 1 tablet (1 tablet Oral Given 03/20/17 1408)     Initial Impression / Assessment and Plan / ED Course  I have reviewed the triage vital signs and the nursing notes.  Pertinent labs & imaging results that were available during my care of the patient were reviewed by me and  considered in my medical decision making (see chart for details).  Clinical Course as of Mar 20 1458  Sun Mar 20, 2017  1346 Appearance: (!) TURBID [CG]  1346 Hgb urine dipstick: (!) SMALL [CG]  1346 Leukocytes, UA: (!) LARGE [CG]  1346 WBC, UA: TOO NUMEROUS TO COUNT [CG]  1346 Squamous Epithelial / LPF: NONE SEEN [CG]    Clinical Course User Index [CG] Kinnie Feil, PA-C   83 year old male with history of CKD, BPH with urinary retention with chronic catheter, recurrent UTIs presents to ED with urinary retention with UTI symptoms. UTI symptoms ongoing for 1 week. Last able to void urine 2 days ago. No fevers, chills, flank pain. On initial exam, he is tachycardic but afebrile. He is nontoxic appearing. He has suprapubic tenderness but no guarding, rigidity or rebound, CVA tenderness. Given symptoms of UTI with dirty looking urine today will tx for UTI. No signs or symptoms to suggest pyelonephritis. Given age, tachycardia, UTI symptoms for over a week will get CBC to ensure there is no leukocytosis. Post renal/obstructive issue may have bumped up creatinine, will get chem 8.   Last UTI diagnosed one month ago, most recent urine grew E coli. The urologist used Augmentin with adequate resolution. Will send urine culture and treat with Augmentin. First dose given in the ED.  Final Clinical Impressions(s) / ED Diagnoses   CBC without leukocytosis. Slight increase in creatinine. Patient was given IV fluids and first dose of Augmentin in the ED. Catheter was changed by RN. We'll discharge with antibiotics and follow-up with urology. Patient discussed with supervising physician who is agreeable with ED treatment and discharge plan.  Final diagnoses:  Urinary retention  Acute cystitis with hematuria    New Prescriptions New Prescriptions   AMOXICILLIN-CLAVULANATE (AUGMENTIN) 875-125 MG TABLET    Take 1 tablet by mouth every 12 (twelve) hours.     Kinnie Feil, PA-C 03/20/17  1459    Virgel Manifold, MD 03/21/17 1246

## 2017-03-20 NOTE — ED Notes (Signed)
Patient discharged home with leg bag.

## 2017-03-20 NOTE — Discharge Instructions (Signed)
You presented to the emergency department with urinary retention, bladder pain, and other urinary tract infection symptoms.  Your bladder was scanned and you had about 200 mL of retained urine. Your catheter was replaced in the ED.  Given your urinary tract infection symptoms ongoing for about a week and recent urine retention, basic blood work was obtained. There is no signs of infection in your blood. Your kidney function is a slightly elevated from your baseline, this is likely from obstruction.  Please take antibiotic as prescribed. Follow-up with your urologist as soon as possible for reevaluation and to ensure symptoms are improving. Return to the ED for fevers, chills, worsening urinary tract infection symptoms, flank or back pain.

## 2017-03-20 NOTE — ED Triage Notes (Signed)
Patient c/o urinary retention x2 days. Hx chronic foley.

## 2017-03-20 NOTE — ED Notes (Signed)
Bed: BC48 Expected date:  Expected time:  Means of arrival:  Comments: T2

## 2017-03-22 LAB — URINE CULTURE

## 2017-03-23 ENCOUNTER — Telehealth: Payer: Self-pay | Admitting: Emergency Medicine

## 2017-03-23 NOTE — Telephone Encounter (Signed)
Post ED Visit - Positive Culture Follow-up  Culture report reviewed by antimicrobial stewardship pharmacist:  []  Elenor Quinones, Pharm.D. []  Heide Guile, Pharm.D., BCPS AQ-ID []  Parks Neptune, Pharm.D., BCPS []  Alycia Rossetti, Pharm.D., BCPS []  Cooper, Pharm.D., BCPS, AAHIVP []  Legrand Como, Pharm.D., BCPS, AAHIVP []  Salome Arnt, PharmD, BCPS []  Dimitri Ped, PharmD, BCPS []  Vincenza Hews, PharmD, BCPS  Positive urine culture Treated with amoxicillin, organism sensitive to the same and no further patient follow-up is required at this time.  Hazle Nordmann 03/23/2017, 12:01 PM

## 2017-03-28 ENCOUNTER — Encounter (HOSPITAL_COMMUNITY)
Admission: RE | Admit: 2017-03-28 | Discharge: 2017-03-28 | Disposition: A | Discharge status: Home / Self Care | Payer: Medicare HMO | Source: Ambulatory Visit | Attending: Nephrology | Admitting: Nephrology

## 2017-03-28 DIAGNOSIS — N184 Chronic kidney disease, stage 4 (severe): Secondary | ICD-10-CM | POA: Diagnosis not present

## 2017-03-28 LAB — RENAL FUNCTION PANEL
ANION GAP: 12 (ref 5–15)
Albumin: 3.5 g/dL (ref 3.5–5.0)
BUN: 43 mg/dL — ABNORMAL HIGH (ref 6–20)
CALCIUM: 8.8 mg/dL — AB (ref 8.9–10.3)
CHLORIDE: 108 mmol/L (ref 101–111)
CO2: 22 mmol/L (ref 22–32)
CREATININE: 3.38 mg/dL — AB (ref 0.61–1.24)
GFR, EST AFRICAN AMERICAN: 18 mL/min — AB (ref >60)
GFR, EST NON AFRICAN AMERICAN: 16 mL/min — AB (ref >60)
Glucose, Bld: 104 mg/dL — ABNORMAL HIGH (ref 65–99)
Phosphorus: 4.2 mg/dL (ref 2.5–4.6)
Potassium: 4.4 mmol/L (ref 3.5–5.1)
Sodium: 142 mmol/L (ref 135–145)

## 2017-03-28 LAB — FERRITIN: FERRITIN: 144 ng/mL (ref 24–336)

## 2017-03-28 LAB — IRON AND TIBC
IRON: 73 ug/dL (ref 45–182)
Saturation Ratios: 28 % (ref 17.9–39.5)
TIBC: 262 ug/dL (ref 250–450)
UIBC: 189 ug/dL

## 2017-03-28 LAB — MAGNESIUM: Magnesium: 2.7 mg/dL — ABNORMAL HIGH (ref 1.7–2.4)

## 2017-03-28 LAB — POCT HEMOGLOBIN-HEMACUE: Hemoglobin: 10.2 g/dL — ABNORMAL LOW (ref 13.0–17.0)

## 2017-03-28 MED ORDER — EPOETIN ALFA 10000 UNIT/ML IJ SOLN
10000.0000 [IU] | INTRAMUSCULAR | Status: DC
  Administered 2017-03-28: 10000 [IU] via SUBCUTANEOUS

## 2017-03-28 MED ORDER — EPOETIN ALFA 10000 UNIT/ML IJ SOLN
INTRAMUSCULAR | Status: AC
  Administered 2017-03-28: 10000 [IU] via SUBCUTANEOUS
  Filled 2017-03-28: qty 1

## 2017-04-11 ENCOUNTER — Encounter (HOSPITAL_COMMUNITY)
Admission: RE | Admit: 2017-04-11 | Discharge: 2017-04-11 | Disposition: A | Discharge status: Home / Self Care | Payer: Medicare HMO | Source: Ambulatory Visit | Attending: Nephrology | Admitting: Nephrology

## 2017-04-11 VITALS — BP 133/67 | HR 76 | Temp 97.8°F | Resp 20

## 2017-04-11 DIAGNOSIS — N184 Chronic kidney disease, stage 4 (severe): Secondary | ICD-10-CM | POA: Insufficient documentation

## 2017-04-11 DIAGNOSIS — Z5181 Encounter for therapeutic drug level monitoring: Secondary | ICD-10-CM | POA: Insufficient documentation

## 2017-04-11 DIAGNOSIS — D631 Anemia in chronic kidney disease: Secondary | ICD-10-CM | POA: Diagnosis not present

## 2017-04-11 DIAGNOSIS — Z79899 Other long term (current) drug therapy: Secondary | ICD-10-CM | POA: Insufficient documentation

## 2017-04-11 LAB — POCT HEMOGLOBIN-HEMACUE: Hemoglobin: 10 g/dL — ABNORMAL LOW (ref 13.0–17.0)

## 2017-04-11 MED ORDER — EPOETIN ALFA 10000 UNIT/ML IJ SOLN
10000.0000 [IU] | INTRAMUSCULAR | Status: DC
  Administered 2017-04-11: 10000 [IU] via SUBCUTANEOUS

## 2017-04-11 MED ORDER — EPOETIN ALFA 10000 UNIT/ML IJ SOLN
INTRAMUSCULAR | Status: AC
  Filled 2017-04-11: qty 1

## 2017-04-12 ENCOUNTER — Telehealth: Payer: Self-pay | Admitting: *Deleted

## 2017-04-12 NOTE — Telephone Encounter (Signed)
Patient mailed note to PCP requesting Rx for stairlift to be reimbursed for state sales tax by [employer]. Rx placed in PCP box for signature. Please return to Lauren's box so this may be faxed to [facility] at [phone]. Hubbard Hartshorn, RN, BSN

## 2017-04-14 NOTE — Telephone Encounter (Signed)
Rx and form faxed to [employer] at [phone]. Message left on patient's VM that this has been done.  Hubbard Hartshorn, RN, BSN

## 2017-04-18 DIAGNOSIS — R339 Retention of urine, unspecified: Secondary | ICD-10-CM | POA: Diagnosis not present

## 2017-04-21 DIAGNOSIS — N2581 Secondary hyperparathyroidism of renal origin: Secondary | ICD-10-CM | POA: Diagnosis not present

## 2017-04-21 DIAGNOSIS — I129 Hypertensive chronic kidney disease with stage 1 through stage 4 chronic kidney disease, or unspecified chronic kidney disease: Secondary | ICD-10-CM | POA: Diagnosis not present

## 2017-04-21 DIAGNOSIS — D631 Anemia in chronic kidney disease: Secondary | ICD-10-CM | POA: Diagnosis not present

## 2017-04-21 DIAGNOSIS — N184 Chronic kidney disease, stage 4 (severe): Secondary | ICD-10-CM | POA: Diagnosis not present

## 2017-04-24 ENCOUNTER — Encounter (HOSPITAL_COMMUNITY): Payer: Self-pay | Admitting: Emergency Medicine

## 2017-04-24 ENCOUNTER — Other Ambulatory Visit: Payer: Self-pay

## 2017-04-24 ENCOUNTER — Emergency Department (HOSPITAL_COMMUNITY)
Admission: EM | Admit: 2017-04-24 | Discharge: 2017-04-24 | Disposition: A | Discharge status: Home / Self Care | Payer: Medicare HMO | Attending: Emergency Medicine | Admitting: Emergency Medicine

## 2017-04-24 DIAGNOSIS — I129 Hypertensive chronic kidney disease with stage 1 through stage 4 chronic kidney disease, or unspecified chronic kidney disease: Secondary | ICD-10-CM | POA: Diagnosis not present

## 2017-04-24 DIAGNOSIS — E1122 Type 2 diabetes mellitus with diabetic chronic kidney disease: Secondary | ICD-10-CM | POA: Diagnosis not present

## 2017-04-24 DIAGNOSIS — R339 Retention of urine, unspecified: Secondary | ICD-10-CM | POA: Diagnosis present

## 2017-04-24 DIAGNOSIS — T839XXA Unspecified complication of genitourinary prosthetic device, implant and graft, initial encounter: Secondary | ICD-10-CM | POA: Insufficient documentation

## 2017-04-24 DIAGNOSIS — Z79899 Other long term (current) drug therapy: Secondary | ICD-10-CM | POA: Insufficient documentation

## 2017-04-24 DIAGNOSIS — E039 Hypothyroidism, unspecified: Secondary | ICD-10-CM | POA: Insufficient documentation

## 2017-04-24 DIAGNOSIS — Y732 Prosthetic and other implants, materials and accessory gastroenterology and urology devices associated with adverse incidents: Secondary | ICD-10-CM | POA: Diagnosis not present

## 2017-04-24 DIAGNOSIS — N184 Chronic kidney disease, stage 4 (severe): Secondary | ICD-10-CM | POA: Diagnosis not present

## 2017-04-24 DIAGNOSIS — T83098A Other mechanical complication of other indwelling urethral catheter, initial encounter: Secondary | ICD-10-CM | POA: Diagnosis not present

## 2017-04-24 LAB — URINALYSIS, ROUTINE W REFLEX MICROSCOPIC
Bilirubin Urine: NEGATIVE
GLUCOSE, UA: NEGATIVE mg/dL
KETONES UR: NEGATIVE mg/dL
Nitrite: NEGATIVE
PROTEIN: 100 mg/dL — AB
SQUAMOUS EPITHELIAL / LPF: NONE SEEN
Specific Gravity, Urine: 1.011 (ref 1.005–1.030)
pH: 6 (ref 5.0–8.0)

## 2017-04-24 MED ORDER — LIDOCAINE HCL 2 % EX GEL
1.0000 "application " | Freq: Once | CUTANEOUS | Status: AC
  Administered 2017-04-24: 1 via URETHRAL
  Filled 2017-04-24: qty 11

## 2017-04-24 NOTE — ED Triage Notes (Signed)
Patient complaining of his catheter not putting out urine. Patient states he is in excruciating pain in his lower abdomen. Patient states this started yesterday afternoon. Patient blood pressure is currently high.

## 2017-04-24 NOTE — ED Provider Notes (Signed)
[facility] DEPT Provider Note   CSN: [id number] Arrival date & time: 04/24/17  0235     History   Chief Complaint Chief Complaint  Patient presents with  . Urinary Retention  . Catheter clogged    HPI Richard Moses is a 83 y.o. male.  HPI  This is an 83 year old male with history of chronic kidney disease, diabetes, hypertension, hyperlipidemia, chronic indwelling Foley who presents with concerns that his Foley catheter is clogged.  Patient states that since yesterday afternoon he has noted decreased urine output from his Foley.  He has an increasing lower abdominal discomfort.  Denies any fevers or back pain.  He most recently had his catheter changed on Monday.  He has noted increased sediment.  Denies any other symptoms.    Prior to my evaluation, catheter was assessed by nursing.  It was replaced as it was blocked.  Patient reports complete resolution of abdominal discomfort.  Past Medical History:  Diagnosis Date  . Abnormal MRI, cervical spine 09/29/10   Mildly abnormal MRI cervical spine demonstrating mild spondylosis and disc bulging from C4-5 down to C7-T1.  No spinal stenosis or foraminal narrowing  . Anemia   . Cervical disc herniation 09/29/10   C4-C5 and C7-T1  . Chronic kidney disease (CKD)   . Chronic left-sided headaches   . Colon polyp 2007  . Diabetes mellitus   . External hemorrhoids without mention of complication 3299  . Hyperlipidemia   . Hypertension   . Hypothyroidism 09/29/10   Untreated. Patient not interested in Rx.   . Knee pain, bilateral 02/05/2013   Pain since a fall in 2014  . MRI of brain abnormal 09/29/10   Abnormal MRI of brain, demonstrating mild atrophy  . Neck pain on left side 2012    Patient Active Problem List   Diagnosis Date Noted  . Left arm pain 03/03/2017  . Memory disturbance 03/03/2017  . Bilateral knee pain 03/03/2017  . Bilateral cataracts 01/17/2017  . Peripheral neuropathy 11/30/2016    . Anemia of chronic disease 07/14/2016  . Mass of soft tissue 06/04/2016  . Protein-calorie malnutrition ([place]) 06/04/2016  . BPH (benign prostatic hypertrophy) with urinary obstruction 07/11/2014  . UTI (urinary tract infection) 06/11/2014  . Bilateral hydronephrosis 04/26/2014  . Incomplete bladder emptying 04/26/2014  . Urinary retention 04/22/2014  . Chronic kidney disease (CKD), stage IV (severe) ([place]) 04/17/2014  . Dysphagia, pharyngoesophageal phase 09/11/2012  . Primary hypertension 12/25/2009  . Elevated PSA 05/20/2009  . Diet-controlled diabetes mellitus ([place]) 02/18/2009    Past Surgical History:  Procedure Laterality Date  . MELANOMA EXCISION     left side of neck  . RHINOPLASTY     x2        Home Medications    Prior to Admission medications   Medication Sig Start Date End Date Taking? Authorizing Provider  amoxicillin-clavulanate (AUGMENTIN) 875-125 MG tablet Take 1 tablet by mouth every 12 (twelve) hours. 03/20/17   Kinnie Feil, PA-C  Barberry-Oreg Grape-Goldenseal (BERBERINE COMPLEX PO) Take 500 mg by mouth 3 (three) times daily with meals. Reported on 12/11/2015    [provider]  ferrous sulfate 324 (65 Fe) MG TBEC Take by mouth.    [provider]  gabapentin (NEURONTIN) 100 MG capsule Take 1 capsule (100 mg total) by mouth 2 (two) times daily. Patient not taking: Reported on 03/20/2017 11/30/16   [place] Bing, DO  Multiple Vitamins-Minerals (MENS MULTIVITAMIN PLUS) TABS Take 1 tablet by  mouth daily after breakfast.    [provider]  polyethylene glycol (MIRALAX / GLYCOLAX) packet Take 17 g by mouth daily. Patient not taking: Reported on 03/20/2017 08/20/16   Smiley Houseman, MD    Family History Family History  Problem Relation Age of Onset  . Cancer Father 69       Renal CA  . Cancer Sister        Brain   . Cancer Brother        Brain  . Colon cancer Neg Hx   . Stomach cancer Neg Hx     Social  History Social History   Tobacco Use  . Smoking status: Former Smoker    Types: Cigarettes    Last attempt to quit: 12/06/1966    Years since quitting: 50.4  . Smokeless tobacco: Never Used  Substance Use Topics  . Alcohol use: Yes    Alcohol/week: 0.0 oz    Comment: occasionally - had cut back since diabetes  . Drug use: No     Allergies   Patient has no known allergies.   Review of Systems Review of Systems  Constitutional: Negative for fever.  Respiratory: Negative for shortness of breath.   Cardiovascular: Negative for chest pain.  Gastrointestinal: Positive for abdominal pain. Negative for nausea and vomiting.  Genitourinary: Negative for flank pain.       Foley catheter blockage  All other systems reviewed and are negative.    Physical Exam Updated Vital Signs BP (!) 152/73   Pulse 83   Temp 98 F (36.7 C) (Oral)   Resp 18   Ht 6\' 1"  (1.854 m)   Wt 68 kg (150 lb)   SpO2 97%   BMI 19.79 kg/m   Physical Exam  Constitutional: He is oriented to person, place, and time. He appears well-developed and well-nourished.  HENT:  Head: Normocephalic and atraumatic.  Cardiovascular: Normal rate, regular rhythm and normal heart sounds.  Pulmonary/Chest: Effort normal and breath sounds normal. No respiratory distress. He has no wheezes.  Abdominal: Soft. Bowel sounds are normal. He exhibits no mass. There is no tenderness. There is no rebound.  Genitourinary:  Genitourinary Comments: Foley catheter in place draining appropriately  Musculoskeletal: He exhibits no edema.  Neurological: He is alert and oriented to person, place, and time.  Skin: Skin is warm and dry.  Psychiatric: He has a normal mood and affect.  Nursing note and vitals reviewed.    ED Treatments / Results  Labs (all labs ordered are listed, but only abnormal results are displayed) Labs Reviewed  URINALYSIS, ROUTINE W REFLEX MICROSCOPIC - Abnormal; Notable for the following components:       Result Value   Color, Urine AMBER (*)    APPearance CLOUDY (*)    Hgb urine dipstick LARGE (*)    Protein, ur 100 (*)    Leukocytes, UA LARGE (*)    Bacteria, UA FEW (*)    All other components within normal limits  URINE CULTURE  URINE CULTURE    EKG  EKG Interpretation None       Radiology No results found.  Procedures Procedures (including critical care time)  Medications Ordered in ED Medications  lidocaine (XYLOCAINE) 2 % jelly 1 application (1 application Urethral Given 04/24/17 0304)     Initial Impression / Assessment and Plan / ED Course  I have reviewed the triage vital signs and the nursing notes.  Pertinent labs & imaging results that were available during my  care of the patient were reviewed by me and considered in my medical decision making (see chart for details).     Patient presents with concerns for clogged Foley catheter.  This was replaced and his symptoms have completely resolved.  His urine does appear to have some increased sediment in it.  No significant blood.  Urinalysis obtained.  It does show too numerous to count white cells but few bacteria.  He is likely chronically colonized.  Given absence of systemic symptoms, would elect to culture and defer antibiotics at this time.  After history, exam, and medical workup I feel the patient has been appropriately medically screened and is safe for discharge home. Pertinent diagnoses were discussed with the patient. Patient was given return precautions.   Final Clinical Impressions(s) / ED Diagnoses   Final diagnoses:  Problem with Foley catheter, initial encounter [facility])    ED Discharge Orders    None       Dina Rich, Barbette Hair, MD 04/24/17 [phone]

## 2017-04-25 ENCOUNTER — Encounter (HOSPITAL_COMMUNITY)
Admission: RE | Admit: 2017-04-25 | Discharge: 2017-04-25 | Disposition: A | Discharge status: Home / Self Care | Payer: Medicare HMO | Source: Ambulatory Visit | Attending: Nephrology | Admitting: Nephrology

## 2017-04-25 VITALS — BP 163/84 | HR 70 | Resp 18

## 2017-04-25 DIAGNOSIS — N184 Chronic kidney disease, stage 4 (severe): Secondary | ICD-10-CM

## 2017-04-25 LAB — FERRITIN: Ferritin: 114 ng/mL (ref 24–336)

## 2017-04-25 LAB — RENAL FUNCTION PANEL
ALBUMIN: 3.6 g/dL (ref 3.5–5.0)
ANION GAP: 7 (ref 5–15)
BUN: 53 mg/dL — AB (ref 6–20)
CALCIUM: 8.9 mg/dL (ref 8.9–10.3)
CO2: 24 mmol/L (ref 22–32)
Chloride: 110 mmol/L (ref 101–111)
Creatinine, Ser: 3.48 mg/dL — ABNORMAL HIGH (ref 0.61–1.24)
GFR calc Af Amer: 17 mL/min — ABNORMAL LOW (ref >60)
GFR calc non Af Amer: 15 mL/min — ABNORMAL LOW (ref >60)
GLUCOSE: 109 mg/dL — AB (ref 65–99)
PHOSPHORUS: 4 mg/dL (ref 2.5–4.6)
POTASSIUM: 4.4 mmol/L (ref 3.5–5.1)
SODIUM: 141 mmol/L (ref 135–145)

## 2017-04-25 LAB — IRON AND TIBC
Iron: 67 ug/dL (ref 45–182)
SATURATION RATIOS: 23 % (ref 17.9–39.5)
TIBC: 294 ug/dL (ref 250–450)
UIBC: 227 ug/dL

## 2017-04-25 LAB — POCT HEMOGLOBIN-HEMACUE: HEMOGLOBIN: 10 g/dL — AB (ref 13.0–17.0)

## 2017-04-25 LAB — MAGNESIUM: MAGNESIUM: 2.6 mg/dL — AB (ref 1.7–2.4)

## 2017-04-25 MED ORDER — EPOETIN ALFA 10000 UNIT/ML IJ SOLN: 10000.0000 [IU] | INTRAMUSCULAR | Status: DC

## 2017-04-25 MED ORDER — EPOETIN ALFA 20000 UNIT/ML IJ SOLN
20000.0000 [IU] | Freq: Once | INTRAMUSCULAR | Status: AC
  Administered 2017-04-25: 20000 [IU] via SUBCUTANEOUS

## 2017-04-25 MED ORDER — EPOETIN ALFA 20000 UNIT/ML IJ SOLN
INTRAMUSCULAR | Status: AC
  Administered 2017-04-25: 20000 [IU] via SUBCUTANEOUS
  Filled 2017-04-25: qty 1

## 2017-04-28 LAB — URINE CULTURE: SPECIAL REQUESTS: NORMAL

## 2017-04-29 ENCOUNTER — Telehealth: Payer: Self-pay

## 2017-04-29 NOTE — Telephone Encounter (Signed)
Called pt for symptom check since ED visit on 04/24/17. Pt stated no more problems. Reminded to see PCP or ED is symptoms return.  No symptoms no treatment for UC recommenced by Lenn Sink PA-C

## 2017-04-29 NOTE — Progress Notes (Signed)
ED Antimicrobial Stewardship Positive Culture Follow Up   Richard Moses is an 83 y.o. male who presented to [facility] on 04/24/2017 with a chief complaint of  Chief Complaint  Patient presents with  . Urinary Retention  . Catheter clogged    Recent Results (from the past 720 hour(s))  Urine C&S     Status: Abnormal   Collection Time: 04/24/17  2:47 AM  Result Value Ref Range Status   Specimen Description URINE, CATHETERIZED  Final   Special Requests Normal  Final   Culture (A)  Final    >=100,000 COLONIES/mL ESCHERICHIA COLI Confirmed Extended Spectrum Beta-Lactamase Producer (ESBL).  In bloodstream infections from ESBL organisms, carbapenems are preferred over piperacillin/tazobactam. They are shown to have a lower risk of mortality. 80,000 COLONIES/mL ENTEROCOCCUS FAECALIS    Report Status 04/28/2017 FINAL  Final   Organism ID, Bacteria ESCHERICHIA COLI (A)  Final   Organism ID, Bacteria ENTEROCOCCUS FAECALIS (A)  Final      Susceptibility   Escherichia coli - MIC*    AMPICILLIN >=32 RESISTANT Resistant     CEFAZOLIN >=64 RESISTANT Resistant     CEFTRIAXONE >=64 RESISTANT Resistant     CIPROFLOXACIN >=4 RESISTANT Resistant     GENTAMICIN <=1 SENSITIVE Sensitive     IMIPENEM <=0.25 SENSITIVE Sensitive     NITROFURANTOIN <=16 SENSITIVE Sensitive     TRIMETH/SULFA >=320 RESISTANT Resistant     AMPICILLIN/SULBACTAM 4 SENSITIVE Sensitive     PIP/TAZO <=4 SENSITIVE Sensitive     Extended ESBL POSITIVE Resistant     * >=100,000 COLONIES/mL ESCHERICHIA COLI   Enterococcus faecalis - MIC*    AMPICILLIN <=2 SENSITIVE Sensitive     LEVOFLOXACIN 1 SENSITIVE Sensitive     NITROFURANTOIN <=16 SENSITIVE Sensitive     VANCOMYCIN 1 SENSITIVE Sensitive     * 80,000 COLONIES/mL ENTEROCOCCUS FAECALIS    [x]  Patient discharged originally without antimicrobial agent and treatment is now indicated  Buyer, retail to call patient. If symptoms are resolved, no further treatment If symptoms  persist: Fosfomycin 3g PO q48h x 3 doses.  ED Provider: Okey Regal, PA-C   [name] 04/29/2017, 8:30 AM Infectious Diseases Pharmacist Phone# [phone]

## 2017-05-03 DIAGNOSIS — R339 Retention of urine, unspecified: Secondary | ICD-10-CM | POA: Diagnosis not present

## 2017-05-09 ENCOUNTER — Encounter (HOSPITAL_COMMUNITY)
Admission: RE | Admit: 2017-05-09 | Discharge: 2017-05-09 | Disposition: A | Discharge status: Home / Self Care | Payer: Medicare HMO | Source: Ambulatory Visit | Attending: Nephrology | Admitting: Nephrology

## 2017-05-09 VITALS — BP 142/66 | HR 94 | Temp 98.0°F | Resp 20

## 2017-05-09 DIAGNOSIS — Z5181 Encounter for therapeutic drug level monitoring: Secondary | ICD-10-CM | POA: Diagnosis not present

## 2017-05-09 DIAGNOSIS — Z79899 Other long term (current) drug therapy: Secondary | ICD-10-CM | POA: Diagnosis not present

## 2017-05-09 DIAGNOSIS — D631 Anemia in chronic kidney disease: Secondary | ICD-10-CM | POA: Insufficient documentation

## 2017-05-09 DIAGNOSIS — N184 Chronic kidney disease, stage 4 (severe): Secondary | ICD-10-CM | POA: Diagnosis present

## 2017-05-09 LAB — POCT HEMOGLOBIN-HEMACUE: Hemoglobin: 10.7 g/dL — ABNORMAL LOW (ref 13.0–17.0)

## 2017-05-09 MED ORDER — EPOETIN ALFA 20000 UNIT/ML IJ SOLN
20000.0000 [IU] | INTRAMUSCULAR | Status: DC
  Administered 2017-05-09: 20000 [IU] via SUBCUTANEOUS

## 2017-05-09 MED ORDER — EPOETIN ALFA 20000 UNIT/ML IJ SOLN
INTRAMUSCULAR | Status: AC
  Filled 2017-05-09: qty 1

## 2017-05-23 ENCOUNTER — Encounter (HOSPITAL_COMMUNITY)
Admission: RE | Admit: 2017-05-23 | Discharge: 2017-05-23 | Disposition: A | Discharge status: Home / Self Care | Payer: Medicare HMO | Source: Ambulatory Visit | Attending: Nephrology | Admitting: Nephrology

## 2017-05-23 VITALS — BP 163/85 | HR 185 | Temp 98.0°F | Resp 18

## 2017-05-23 DIAGNOSIS — N184 Chronic kidney disease, stage 4 (severe): Secondary | ICD-10-CM

## 2017-05-23 DIAGNOSIS — R339 Retention of urine, unspecified: Secondary | ICD-10-CM | POA: Diagnosis not present

## 2017-05-23 LAB — RENAL FUNCTION PANEL
ALBUMIN: 3.7 g/dL (ref 3.5–5.0)
Anion gap: 11 (ref 5–15)
BUN: 53 mg/dL — AB (ref 6–20)
CO2: 24 mmol/L (ref 22–32)
CREATININE: 3.49 mg/dL — AB (ref 0.61–1.24)
Calcium: 9.5 mg/dL (ref 8.9–10.3)
Chloride: 106 mmol/L (ref 101–111)
GFR calc Af Amer: 17 mL/min — ABNORMAL LOW (ref >60)
GFR, EST NON AFRICAN AMERICAN: 15 mL/min — AB (ref >60)
Glucose, Bld: 109 mg/dL — ABNORMAL HIGH (ref 65–99)
PHOSPHORUS: 4.3 mg/dL (ref 2.5–4.6)
Potassium: 4.1 mmol/L (ref 3.5–5.1)
Sodium: 141 mmol/L (ref 135–145)

## 2017-05-23 LAB — POCT HEMOGLOBIN-HEMACUE: Hemoglobin: 11.2 g/dL — ABNORMAL LOW (ref 13.0–17.0)

## 2017-05-23 LAB — MAGNESIUM: Magnesium: 3 mg/dL — ABNORMAL HIGH (ref 1.7–2.4)

## 2017-05-23 LAB — IRON AND TIBC
IRON: 110 ug/dL (ref 45–182)
Saturation Ratios: 31 % (ref 17.9–39.5)
TIBC: 354 ug/dL (ref 250–450)
UIBC: 244 ug/dL

## 2017-05-23 LAB — FERRITIN: Ferritin: 94 ng/mL (ref 24–336)

## 2017-05-23 MED ORDER — EPOETIN ALFA 20000 UNIT/ML IJ SOLN
INTRAMUSCULAR | Status: AC
  Administered 2017-05-23: 20000 [IU] via SUBCUTANEOUS
  Filled 2017-05-23: qty 1

## 2017-05-23 MED ORDER — EPOETIN ALFA 20000 UNIT/ML IJ SOLN
20000.0000 [IU] | INTRAMUSCULAR | Status: DC
  Administered 2017-05-23: 20000 [IU] via SUBCUTANEOUS

## 2017-06-06 ENCOUNTER — Encounter (HOSPITAL_COMMUNITY)
Admission: RE | Admit: 2017-06-06 | Discharge: 2017-06-06 | Disposition: A | Discharge status: Home / Self Care | Payer: Medicare HMO | Source: Ambulatory Visit | Attending: Nephrology | Admitting: Nephrology

## 2017-06-06 VITALS — BP 149/69 | HR 94 | Resp 18

## 2017-06-06 DIAGNOSIS — N184 Chronic kidney disease, stage 4 (severe): Secondary | ICD-10-CM

## 2017-06-06 LAB — POCT HEMOGLOBIN-HEMACUE: Hemoglobin: 11 g/dL — ABNORMAL LOW (ref 13.0–17.0)

## 2017-06-06 MED ORDER — EPOETIN ALFA 20000 UNIT/ML IJ SOLN
20000.0000 [IU] | INTRAMUSCULAR | Status: DC
  Administered 2017-06-06: 20000 [IU] via SUBCUTANEOUS

## 2017-06-06 MED ORDER — EPOETIN ALFA 20000 UNIT/ML IJ SOLN
INTRAMUSCULAR | Status: AC
  Filled 2017-06-06: qty 1

## 2017-06-09 ENCOUNTER — Emergency Department (HOSPITAL_COMMUNITY)
Admission: EM | Admit: 2017-06-09 | Discharge: 2017-06-09 | Disposition: A | Discharge status: Home / Self Care | Payer: Medicare HMO | Attending: Emergency Medicine | Admitting: Emergency Medicine

## 2017-06-09 ENCOUNTER — Encounter (HOSPITAL_COMMUNITY): Payer: Self-pay

## 2017-06-09 DIAGNOSIS — N184 Chronic kidney disease, stage 4 (severe): Secondary | ICD-10-CM | POA: Insufficient documentation

## 2017-06-09 DIAGNOSIS — Z79899 Other long term (current) drug therapy: Secondary | ICD-10-CM | POA: Diagnosis not present

## 2017-06-09 DIAGNOSIS — R319 Hematuria, unspecified: Secondary | ICD-10-CM | POA: Diagnosis not present

## 2017-06-09 DIAGNOSIS — N390 Urinary tract infection, site not specified: Secondary | ICD-10-CM | POA: Diagnosis not present

## 2017-06-09 DIAGNOSIS — R339 Retention of urine, unspecified: Secondary | ICD-10-CM | POA: Diagnosis not present

## 2017-06-09 DIAGNOSIS — T83098A Other mechanical complication of other indwelling urethral catheter, initial encounter: Secondary | ICD-10-CM | POA: Insufficient documentation

## 2017-06-09 DIAGNOSIS — Z85820 Personal history of malignant melanoma of skin: Secondary | ICD-10-CM | POA: Insufficient documentation

## 2017-06-09 DIAGNOSIS — Y69 Unspecified misadventure during surgical and medical care: Secondary | ICD-10-CM | POA: Diagnosis not present

## 2017-06-09 DIAGNOSIS — Z87891 Personal history of nicotine dependence: Secondary | ICD-10-CM | POA: Insufficient documentation

## 2017-06-09 DIAGNOSIS — E1122 Type 2 diabetes mellitus with diabetic chronic kidney disease: Secondary | ICD-10-CM | POA: Diagnosis not present

## 2017-06-09 DIAGNOSIS — T839XXA Unspecified complication of genitourinary prosthetic device, implant and graft, initial encounter: Secondary | ICD-10-CM

## 2017-06-09 DIAGNOSIS — E039 Hypothyroidism, unspecified: Secondary | ICD-10-CM | POA: Diagnosis not present

## 2017-06-09 LAB — URINALYSIS, ROUTINE W REFLEX MICROSCOPIC
Bilirubin Urine: NEGATIVE
GLUCOSE, UA: NEGATIVE mg/dL
Ketones, ur: NEGATIVE mg/dL
Nitrite: NEGATIVE
PH: 7 (ref 5.0–8.0)
PROTEIN: 100 mg/dL — AB
Specific Gravity, Urine: 1.013 (ref 1.005–1.030)
Squamous Epithelial / LPF: NONE SEEN

## 2017-06-09 LAB — CBC
HEMATOCRIT: 35.9 % — AB (ref 39.0–52.0)
HEMOGLOBIN: 11.6 g/dL — AB (ref 13.0–17.0)
MCH: 32.8 pg (ref 26.0–34.0)
MCHC: 32.3 g/dL (ref 30.0–36.0)
MCV: 101.4 fL — ABNORMAL HIGH (ref 78.0–100.0)
Platelets: 153 10*3/uL (ref 150–400)
RBC: 3.54 MIL/uL — ABNORMAL LOW (ref 4.22–5.81)
RDW: 14.3 % (ref 11.5–15.5)
WBC: 8.5 10*3/uL (ref 4.0–10.5)

## 2017-06-09 LAB — BASIC METABOLIC PANEL
Anion gap: 10 (ref 5–15)
BUN: 68 mg/dL — ABNORMAL HIGH (ref 6–20)
CHLORIDE: 109 mmol/L (ref 101–111)
CO2: 19 mmol/L — AB (ref 22–32)
CREATININE: 4.21 mg/dL — AB (ref 0.61–1.24)
Calcium: 9 mg/dL (ref 8.9–10.3)
GFR calc Af Amer: 14 mL/min — ABNORMAL LOW (ref >60)
GFR calc non Af Amer: 12 mL/min — ABNORMAL LOW (ref >60)
Glucose, Bld: 154 mg/dL — ABNORMAL HIGH (ref 65–99)
POTASSIUM: 4.1 mmol/L (ref 3.5–5.1)
Sodium: 138 mmol/L (ref 135–145)

## 2017-06-09 MED ORDER — CEPHALEXIN 500 MG PO CAPS: 500.0000 mg | ORAL_CAPSULE | Freq: Three times a day (TID) | ORAL | 0 refills | Status: DC

## 2017-06-09 NOTE — ED Provider Notes (Signed)
[place] DEPT Provider Note   CSN: [id number] Arrival date & time: 06/09/17  2035     History   Chief Complaint Chief Complaint  Patient presents with  . catheter clogged    HPI Richard Moses is a 84 y.o. male.  The history is provided by the patient and medical records.     84 year old male with history of anemia, chronic kidney disease, diabetes, hyperlipidemia, hypertension, hypothyroidism, BPH with secondary urinary retention requiring Foley catheter, presenting to the ED with complaint of catheter being clogged.  States he has not had much output from the catheter all day and started having pain in his abdomen.  He denies any fever or chills.  No nausea or vomiting.  Patient has had issues like this in the past.  His catheter was actually changed 1 week ago in the urology clinic at [facility] for similar.  Past Medical History:  Diagnosis Date  . Abnormal MRI, cervical spine 09/29/10   Mildly abnormal MRI cervical spine demonstrating mild spondylosis and disc bulging from C4-5 down to C7-T1.  No spinal stenosis or foraminal narrowing  . Anemia   . Cervical disc herniation 09/29/10   C4-C5 and C7-T1  . Chronic kidney disease (CKD)   . Chronic left-sided headaches   . Colon polyp 2007  . Diabetes mellitus   . External hemorrhoids without mention of complication 3086  . Hyperlipidemia   . Hypertension   . Hypothyroidism 09/29/10   Untreated. Patient not interested in Rx.   . Knee pain, bilateral 02/05/2013   Pain since a fall in 2014  . MRI of brain abnormal 09/29/10   Abnormal MRI of brain, demonstrating mild atrophy  . Neck pain on left side 2012    Patient Active Problem List   Diagnosis Date Noted  . Left arm pain 03/03/2017  . Memory disturbance 03/03/2017  . Bilateral knee pain 03/03/2017  . Bilateral cataracts 01/17/2017  . Peripheral neuropathy 11/30/2016  . Anemia of chronic disease 07/14/2016  . Mass of soft tissue  06/04/2016  . Protein-calorie malnutrition ([place]) 06/04/2016  . BPH (benign prostatic hypertrophy) with urinary obstruction 07/11/2014  . UTI (urinary tract infection) 06/11/2014  . Bilateral hydronephrosis 04/26/2014  . Incomplete bladder emptying 04/26/2014  . Urinary retention 04/22/2014  . Chronic kidney disease (CKD), stage IV (severe) ([place]) 04/17/2014  . Dysphagia, pharyngoesophageal phase 09/11/2012  . Primary hypertension 12/25/2009  . Elevated PSA 05/20/2009  . Diet-controlled diabetes mellitus ([place]) 02/18/2009    Past Surgical History:  Procedure Laterality Date  . MELANOMA EXCISION     left side of neck  . RHINOPLASTY     x2        Home Medications    Prior to Admission medications   Medication Sig Start Date End Date Taking? Authorizing Provider  amoxicillin-clavulanate (AUGMENTIN) 875-125 MG tablet Take 1 tablet by mouth every 12 (twelve) hours. 03/20/17   Kinnie Feil, PA-C  Barberry-Oreg Grape-Goldenseal (BERBERINE COMPLEX PO) Take 500 mg by mouth 3 (three) times daily with meals. Reported on 12/11/2015    [provider]  ferrous sulfate 324 (65 Fe) MG TBEC Take by mouth.    [provider]  gabapentin (NEURONTIN) 100 MG capsule Take 1 capsule (100 mg total) by mouth 2 (two) times daily. Patient not taking: Reported on 03/20/2017 11/30/16   [place] Bing, DO  Multiple Vitamins-Minerals (MENS MULTIVITAMIN PLUS) TABS Take 1 tablet by mouth daily after breakfast.    [provider]  polyethylene glycol (MIRALAX / GLYCOLAX) packet Take 17 g by mouth daily. Patient not taking: Reported on 03/20/2017 08/20/16   Smiley Houseman, MD    Family History Family History  Problem Relation Age of Onset  . Cancer Father 23       Renal CA  . Cancer Sister        Brain   . Cancer Brother        Brain  . Colon cancer Neg Hx   . Stomach cancer Neg Hx     Social History Social History   Tobacco Use  . Smoking status: Former  Smoker    Types: Cigarettes    Last attempt to quit: 12/06/1966    Years since quitting: 50.5  . Smokeless tobacco: Never Used  Substance Use Topics  . Alcohol use: Yes    Alcohol/week: 0.0 oz    Comment: occasionally - had cut back since diabetes  . Drug use: No     Allergies   Patient has no known allergies.   Review of Systems Review of Systems  Genitourinary: Positive for decreased urine volume.  All other systems reviewed and are negative.    Physical Exam Updated Vital Signs BP (!) 171/86 (BP Location: Left Arm)   Pulse (!) 132   Temp 99.5 F (37.5 C) (Oral)   Resp 20   SpO2 96%   Physical Exam  Constitutional: He is oriented to person, place, and time. He appears well-developed and well-nourished.  HENT:  Head: Normocephalic and atraumatic.  Mouth/Throat: Oropharynx is clear and moist.  Eyes: Conjunctivae and EOM are normal. Pupils are equal, round, and reactive to light.  Neck: Normal range of motion.  Cardiovascular: Normal rate, regular rhythm and normal heart sounds.  Pulmonary/Chest: Effort normal and breath sounds normal.  Abdominal: Soft. Bowel sounds are normal. He exhibits no distension. There is no tenderness.  Genitourinary:  Genitourinary Comments: Urine freely draining into collection bag, does appear to be very cloudy with large amount of sediment  Musculoskeletal: Normal range of motion.  Neurological: He is alert and oriented to person, place, and time.  Skin: Skin is warm and dry.  Psychiatric: He has a normal mood and affect.  Nursing note and vitals reviewed.    ED Treatments / Results  Labs (all labs ordered are listed, but only abnormal results are displayed) Labs Reviewed  URINALYSIS, ROUTINE W REFLEX MICROSCOPIC - Abnormal; Notable for the following components:      Result Value   Color, Urine AMBER (*)    APPearance CLOUDY (*)    Hgb urine dipstick LARGE (*)    Protein, ur 100 (*)    Leukocytes, UA LARGE (*)    Bacteria, UA  MANY (*)    All other components within normal limits  CBC - Abnormal; Notable for the following components:   RBC 3.54 (*)    Hemoglobin 11.6 (*)    HCT 35.9 (*)    MCV 101.4 (*)    All other components within normal limits  BASIC METABOLIC PANEL - Abnormal; Notable for the following components:   CO2 19 (*)    Glucose, Bld 154 (*)    BUN 68 (*)    Creatinine, Ser 4.21 (*)    GFR calc non Af Amer 12 (*)    GFR calc Af Amer 14 (*)    All other components within normal limits  URINE CULTURE    EKG  EKG Interpretation None  Radiology No results found.  Procedures Procedures (including critical care time)  Medications Ordered in ED Medications - No data to display   Initial Impression / Assessment and Plan / ED Course  I have reviewed the triage vital signs and the nursing notes.  Pertinent labs & imaging results that were available during my care of the patient were reviewed by me and considered in my medical decision making (see chart for details).  84 year old male presenting to the ED with clogged Foley catheter.  Has been seen here in the past for same.  Catheter was changed out upon arrival, now freely draining.  Urine does appear cloudy with large amount of sediment.  Many bacteria noted.  Last few cultures did grow out E. coli.  Given this was obtained from new straight cath, would recommend treatment today, new culture sent.  Also sent screening lab work to assess for renal function.  Likely can discharge.  Screening labs overall reassuring-- slight bump in SrCr when compared with prior, however does tend to fluctuate when compared with prior values.  Suspect this is post renal.  Will have him get creatine rechecked with urology.  Discussed plan with patient, he acknowledged understanding and agreed with plan of care.  Return precautions given for new or worsening symptoms.  Final Clinical Impressions(s) / ED Diagnoses   Final diagnoses:  Problem with Foley  catheter, initial encounter ([place])  Urinary tract infection with hematuria, site unspecified    ED Discharge Orders        Ordered    cephALEXin (KEFLEX) 500 MG capsule  3 times daily     06/09/17 2337       Larene Pickett, PA-C 06/09/17 Linward Foster    Dorie Rank, MD 06/10/17 [phone]

## 2017-06-09 NOTE — Discharge Instructions (Signed)
Recommend to monitor your catheter output. Your urine did show some signs of infection today.  Monitor for fever, chills, etc. Have your kidney function rechecked with your urologist as it was a little higher than normal today. Return here for any new/acute changes.

## 2017-06-09 NOTE — ED Triage Notes (Signed)
Pt complains of his catheter being clogged since all day, the catheter was changed one week ago, pt has a swollen prostrate and will have this catheter for awhile

## 2017-06-09 NOTE — ED Notes (Signed)
Foley catheter changed with a 76 F coude, catheter is draining with blood clots at the beginning

## 2017-06-09 NOTE — ED Notes (Signed)
Bladder scanner-587cc

## 2017-06-09 NOTE — ED Notes (Signed)
Catheter was unable to be flushed

## 2017-06-13 LAB — URINE CULTURE: Culture: >=100000 — AB

## 2017-06-14 ENCOUNTER — Telehealth: Payer: Self-pay | Admitting: Emergency Medicine

## 2017-06-14 NOTE — Telephone Encounter (Signed)
Post ED Visit - Positive Culture Follow-up: Successful Patient Follow-Up  Culture assessed and recommendations reviewed by: []  Elenor Quinones, Pharm.D. []  Heide Guile, Pharm.D., BCPS AQ-ID []  Parks Neptune, Pharm.D., BCPS []  Alycia Rossetti, Pharm.D., BCPS []  [place], Pharm.D., BCPS, AAHIVP []  Legrand Como, Pharm.D., BCPS, AAHIVP []  Salome Arnt, PharmD, BCPS []  Dimitri Ped, PharmD, BCPS []  Vincenza Hews, PharmD, BCPS Richard Moses PharmD  Positive urine culture  []  Patient discharged without antimicrobial prescription and treatment is now indicated []  Organism is resistant to prescribed ED discharge antimicrobial []  Patient with positive blood cultures  Changes discussed with ED provider: Alferd Apa Pharm D New antibiotic prescription stop cephalexin, no further treatment needed   Contacted patient, 06/14/2017 1242   Richard Moses 06/14/2017, 12:40 PM

## 2017-06-14 NOTE — Progress Notes (Signed)
ED Antimicrobial Stewardship Positive Culture Follow Up   Richard Moses is an 84 y.o. male who presented to [facility] on 06/09/2017 with a chief complaint of  Chief Complaint  Patient presents with  . catheter clogged    Recent Results (from the past 720 hour(s))  Urine culture     Status: Abnormal   Collection Time: 06/09/17 11:23 PM  Result Value Ref Range Status   Specimen Description URINE, RANDOM  Final   Special Requests NONE  Final   Culture (A)  Final    >=100,000 COLONIES/mL PROTEUS MIRABILIS >=100,000 COLONIES/mL ESCHERICHIA COLI Confirmed Extended Spectrum Beta-Lactamase Producer (ESBL).  In bloodstream infections from ESBL organisms, carbapenems are preferred over piperacillin/tazobactam. They are shown to have a lower risk of mortality. Performed at [facility], [place] [address] [place], [place] [address]    Report Status 06/13/2017 FINAL  Final   Organism ID, Bacteria PROTEUS MIRABILIS (A)  Final   Organism ID, Bacteria ESCHERICHIA COLI (A)  Final      Susceptibility   Escherichia coli - MIC*    AMPICILLIN >=32 RESISTANT Resistant     CEFAZOLIN >=64 RESISTANT Resistant     CEFTRIAXONE >=64 RESISTANT Resistant     CIPROFLOXACIN >=4 RESISTANT Resistant     GENTAMICIN <=1 SENSITIVE Sensitive     IMIPENEM <=0.25 SENSITIVE Sensitive     NITROFURANTOIN <=16 SENSITIVE Sensitive     TRIMETH/SULFA >=320 RESISTANT Resistant     AMPICILLIN/SULBACTAM 4 SENSITIVE Sensitive     PIP/TAZO <=4 SENSITIVE Sensitive     Extended ESBL POSITIVE Resistant     * >=100,000 COLONIES/mL ESCHERICHIA COLI   Proteus mirabilis - MIC*    AMPICILLIN <=2 SENSITIVE Sensitive     CEFAZOLIN <=4 SENSITIVE Sensitive     CEFTRIAXONE <=1 SENSITIVE Sensitive     CIPROFLOXACIN <=0.25 SENSITIVE Sensitive     GENTAMICIN <=1 SENSITIVE Sensitive     IMIPENEM 2 SENSITIVE Sensitive     NITROFURANTOIN 128 RESISTANT Resistant     TRIMETH/SULFA <=20 SENSITIVE Sensitive     AMPICILLIN/SULBACTAM <=2  SENSITIVE Sensitive     PIP/TAZO <=4 SENSITIVE Sensitive     * >=100,000 COLONIES/mL PROTEUS MIRABILIS     New antibiotic prescription: Patient is to discontinue keflex, no further treatment needed.   ED Provider: Cecilio Asper, PA-C   Jalene Mullet, Pharm.D. PGY1 Pharmacy Resident 06/14/2017 9:22 AM Main Pharmacy: [phone] Phone# [phone]

## 2017-06-20 ENCOUNTER — Encounter (HOSPITAL_COMMUNITY)
Admission: RE | Admit: 2017-06-20 | Discharge: 2017-06-20 | Disposition: A | Discharge status: Home / Self Care | Payer: Medicare HMO | Source: Ambulatory Visit | Attending: Nephrology | Admitting: Nephrology

## 2017-06-20 VITALS — BP 157/71 | HR 120 | Temp 97.9°F | Resp 20

## 2017-06-20 DIAGNOSIS — D631 Anemia in chronic kidney disease: Secondary | ICD-10-CM | POA: Insufficient documentation

## 2017-06-20 DIAGNOSIS — Z5181 Encounter for therapeutic drug level monitoring: Secondary | ICD-10-CM | POA: Diagnosis not present

## 2017-06-20 DIAGNOSIS — Z79899 Other long term (current) drug therapy: Secondary | ICD-10-CM | POA: Insufficient documentation

## 2017-06-20 DIAGNOSIS — N184 Chronic kidney disease, stage 4 (severe): Secondary | ICD-10-CM | POA: Insufficient documentation

## 2017-06-20 LAB — RENAL FUNCTION PANEL
ALBUMIN: 3.1 g/dL — AB (ref 3.5–5.0)
Anion gap: 12 (ref 5–15)
BUN: 51 mg/dL — AB (ref 6–20)
CALCIUM: 10.4 mg/dL — AB (ref 8.9–10.3)
CO2: 18 mmol/L — ABNORMAL LOW (ref 22–32)
CREATININE: 3.64 mg/dL — AB (ref 0.61–1.24)
Chloride: 111 mmol/L (ref 101–111)
GFR, EST AFRICAN AMERICAN: 17 mL/min — AB (ref >60)
GFR, EST NON AFRICAN AMERICAN: 14 mL/min — AB (ref >60)
Glucose, Bld: 120 mg/dL — ABNORMAL HIGH (ref 65–99)
Phosphorus: 4.9 mg/dL — ABNORMAL HIGH (ref 2.5–4.6)
Potassium: 4.1 mmol/L (ref 3.5–5.1)
Sodium: 141 mmol/L (ref 135–145)

## 2017-06-20 LAB — FERRITIN: Ferritin: 167 ng/mL (ref 24–336)

## 2017-06-20 LAB — IRON AND TIBC
IRON: 116 ug/dL (ref 45–182)
Saturation Ratios: 45 % — ABNORMAL HIGH (ref 17.9–39.5)
TIBC: 260 ug/dL (ref 250–450)
UIBC: 144 ug/dL

## 2017-06-20 LAB — MAGNESIUM: Magnesium: 2.8 mg/dL — ABNORMAL HIGH (ref 1.7–2.4)

## 2017-06-20 LAB — POCT HEMOGLOBIN-HEMACUE: Hemoglobin: 11.4 g/dL — ABNORMAL LOW (ref 13.0–17.0)

## 2017-06-20 MED ORDER — EPOETIN ALFA 20000 UNIT/ML IJ SOLN
INTRAMUSCULAR | Status: AC
  Filled 2017-06-20: qty 1

## 2017-06-20 MED ORDER — EPOETIN ALFA 20000 UNIT/ML IJ SOLN
20000.0000 [IU] | INTRAMUSCULAR | Status: DC
  Administered 2017-06-20: 20000 [IU] via SUBCUTANEOUS

## 2017-06-21 ENCOUNTER — Encounter: Payer: Self-pay | Admitting: Family Medicine

## 2017-06-21 ENCOUNTER — Other Ambulatory Visit: Payer: Self-pay

## 2017-06-21 ENCOUNTER — Ambulatory Visit (INDEPENDENT_AMBULATORY_CARE_PROVIDER_SITE_OTHER): Payer: Medicare HMO | Admitting: Family Medicine

## 2017-06-21 VITALS — BP 162/78 | HR 66 | Temp 97.4°F | Ht 73.0 in | Wt 157.6 lb

## 2017-06-21 DIAGNOSIS — R1314 Dysphagia, pharyngoesophageal phase: Secondary | ICD-10-CM | POA: Diagnosis not present

## 2017-06-21 DIAGNOSIS — R4702 Dysphasia: Secondary | ICD-10-CM | POA: Insufficient documentation

## 2017-06-21 NOTE — Progress Notes (Signed)
   Subjective   Patient ID: Richard Moses    DOB: 20-Mar-1933, 84 y.o. male   MRN: [id number]  CC: "Difficulty swallowing"  HPI: Richard Moses is a 84 y.o. male who presents to clinic today for the following:  Difficulty swallowing: Patient has been parents saying progressive difficulty swallowing solid foods for the past 2-3 weeks.  He has a history of a similar presentation several years ago requiring balloon dilatation back in 2014 by GI.  He is tolerating fluids well.  He has increased his weight over the last several months.  He has made efforts to drink more fluids and eat softer foods and soups.  He does have occasional constipation but is up-to-date on his colonoscopy.  He would like a referral to GI for evaluation for dilatation of a possible stricture.   ROS: see HPI for pertinent.  [place]: HTN, NIDDM, CKDIV, BPH w/ urinary retention, anemia of chronic disease, protein calorie malnutrition. Surgical history rhinoplasty x2, melanoma excision L-neck. Family history renal CA, brain CA. Smoking status reviewed. Medications reviewed.  Objective   BP (!) 162/78   Pulse 66   Temp (!) 97.4 F (36.3 C) (Oral)   Ht 6\' 1"  (1.854 m)   Wt 157 lb 9.6 oz (71.5 kg)   SpO2 99%   BMI 20.79 kg/m  Vitals and nursing note reviewed.  General: emaciated elderly male, NAD with non-toxic appearance HEENT: normocephalic, atraumatic, moist mucous membranes, clear pharynx Neck: supple, non-tender without lymphadenopathy Cardiovascular: regular rate and rhythm without murmurs, rubs, or gallops Lungs: clear to auscultation bilaterally with normal work of breathing Abdomen: soft, non-tender, non-distended, normoactive bowel sounds Skin: warm, dry, no rashes or lesions, cap refill < 2 seconds Extremities: warm and well perfused, normal tone, no edema  Assessment & Plan   Dysphagia, pharyngoesophageal phase Acute on chronic. Solids only. Has h/o esophogeal dilatation back in 2014. Wt stable. Seems to  be tolerating fluids well and soft foods. Will need formal GI evaluation for EGD and possible dilatation for LES ring. - Amb referral to GI placed - Instructed patient to continue maintaining soft and pureed foods in the meantime - Reviewed return precautions and red flags   Orders Placed This Encounter  Procedures  . Ambulatory referral to Gastroenterology    Referral Priority:   Routine    Referral Type:   Consultation    Referral Reason:   Specialty Services Required    Number of Visits Requested:   1   No orders of the defined types were placed in this encounter.   Harriet Butte, [place], PGY-2 06/21/2017, 7:57 PM

## 2017-06-21 NOTE — Patient Instructions (Signed)
Thank you for coming in to see [place] today. Please see below to review our plan for today's visit.  1.  Your symptoms are likely due to an esophageal stricture.  I have placed a referral for you to see the GI doctor.  They will call you and schedule the appointment decide when he will undergo the procedure.  In the meantime, drink plenty of fluids and make sure you to your food well.  You may want to consider a softer diet in the meantime. 2.  I would be happy to evaluate you for your memory loss.  Please let the front desk when you leave and asked for a 30-minute appointment to see me in 1 month.  Please call the clinic at [phone] if your symptoms worsen or you have any concerns. It was our pleasure to serve you.  Harriet Butte, [place], PGY-2

## 2017-06-21 NOTE — Assessment & Plan Note (Addendum)
Acute on chronic. Solids only. Has h/o esophogeal dilatation back in 2014. Wt stable. Seems to be tolerating fluids well and soft foods. Will need formal GI evaluation for EGD and possible dilatation for LES ring. - Amb referral to GI placed - Instructed patient to continue maintaining soft and pureed foods in the meantime - Reviewed return precautions and red flags

## 2017-06-22 ENCOUNTER — Emergency Department (HOSPITAL_COMMUNITY)
Admission: EM | Admit: 2017-06-22 | Discharge: 2017-06-23 | Disposition: A | Discharge status: Home / Self Care | Payer: Medicare HMO | Attending: Emergency Medicine | Admitting: Emergency Medicine

## 2017-06-22 ENCOUNTER — Other Ambulatory Visit: Payer: Self-pay

## 2017-06-22 ENCOUNTER — Encounter (HOSPITAL_COMMUNITY): Payer: Self-pay | Admitting: Emergency Medicine

## 2017-06-22 DIAGNOSIS — I129 Hypertensive chronic kidney disease with stage 1 through stage 4 chronic kidney disease, or unspecified chronic kidney disease: Secondary | ICD-10-CM | POA: Diagnosis not present

## 2017-06-22 DIAGNOSIS — R339 Retention of urine, unspecified: Secondary | ICD-10-CM | POA: Insufficient documentation

## 2017-06-22 DIAGNOSIS — Z79899 Other long term (current) drug therapy: Secondary | ICD-10-CM | POA: Insufficient documentation

## 2017-06-22 DIAGNOSIS — N184 Chronic kidney disease, stage 4 (severe): Secondary | ICD-10-CM | POA: Diagnosis not present

## 2017-06-22 DIAGNOSIS — F17210 Nicotine dependence, cigarettes, uncomplicated: Secondary | ICD-10-CM | POA: Diagnosis not present

## 2017-06-22 DIAGNOSIS — E039 Hypothyroidism, unspecified: Secondary | ICD-10-CM | POA: Insufficient documentation

## 2017-06-22 DIAGNOSIS — E119 Type 2 diabetes mellitus without complications: Secondary | ICD-10-CM | POA: Diagnosis not present

## 2017-06-22 DIAGNOSIS — E785 Hyperlipidemia, unspecified: Secondary | ICD-10-CM | POA: Insufficient documentation

## 2017-06-22 DIAGNOSIS — R69 Illness, unspecified: Secondary | ICD-10-CM | POA: Diagnosis not present

## 2017-06-22 LAB — CBC WITH DIFFERENTIAL/PLATELET
BASOS PCT: 0 %
Basophils Absolute: 0 10*3/uL (ref 0.0–0.1)
EOS ABS: 0.2 10*3/uL (ref 0.0–0.7)
Eosinophils Relative: 3 %
HCT: 33.2 % — ABNORMAL LOW (ref 39.0–52.0)
HEMOGLOBIN: 10.5 g/dL — AB (ref 13.0–17.0)
Lymphocytes Relative: 25 %
Lymphs Abs: 1.7 10*3/uL (ref 0.7–4.0)
MCH: 31.3 pg (ref 26.0–34.0)
MCHC: 31.6 g/dL (ref 30.0–36.0)
MCV: 98.8 fL (ref 78.0–100.0)
MONO ABS: 0.4 10*3/uL (ref 0.1–1.0)
MONOS PCT: 5 %
NEUTROS PCT: 67 %
Neutro Abs: 4.7 10*3/uL (ref 1.7–7.7)
Platelets: 265 10*3/uL (ref 150–400)
RBC: 3.36 MIL/uL — ABNORMAL LOW (ref 4.22–5.81)
RDW: 13.8 % (ref 11.5–15.5)
WBC: 7 10*3/uL (ref 4.0–10.5)

## 2017-06-22 LAB — URINALYSIS, ROUTINE W REFLEX MICROSCOPIC
BILIRUBIN URINE: NEGATIVE
Bacteria, UA: NONE SEEN
GLUCOSE, UA: NEGATIVE mg/dL
KETONES UR: NEGATIVE mg/dL
NITRITE: NEGATIVE
PH: 6 (ref 5.0–8.0)
Protein, ur: 100 mg/dL — AB
Specific Gravity, Urine: 1.013 (ref 1.005–1.030)
Squamous Epithelial / LPF: NONE SEEN

## 2017-06-22 LAB — I-STAT CHEM 8, ED
BUN: 53 mg/dL — ABNORMAL HIGH (ref 6–20)
Calcium, Ion: 1.45 mmol/L — ABNORMAL HIGH (ref 1.15–1.40)
Chloride: 111 mmol/L (ref 101–111)
Creatinine, Ser: 3.6 mg/dL — ABNORMAL HIGH (ref 0.61–1.24)
Glucose, Bld: 120 mg/dL — ABNORMAL HIGH (ref 65–99)
HEMATOCRIT: 31 % — AB (ref 39.0–52.0)
Hemoglobin: 10.5 g/dL — ABNORMAL LOW (ref 13.0–17.0)
Potassium: 3.8 mmol/L (ref 3.5–5.1)
SODIUM: 141 mmol/L (ref 135–145)
TCO2: 21 mmol/L — AB (ref 22–32)

## 2017-06-22 MED ORDER — LIDOCAINE HCL 2 % EX GEL
1.0000 "application " | Freq: Once | CUTANEOUS | Status: AC | PRN
  Administered 2017-06-22: 1 via URETHRAL
  Filled 2017-06-22: qty 5

## 2017-06-22 NOTE — ED Notes (Signed)
Previous catheter occluded-removed with large clot noted at tip

## 2017-06-22 NOTE — ED Triage Notes (Signed)
Pt states his catheter is not draining and he is having a lot of pain in his bladder area

## 2017-06-23 ENCOUNTER — Encounter (HOSPITAL_COMMUNITY): Payer: Self-pay | Admitting: Emergency Medicine

## 2017-06-23 NOTE — ED Provider Notes (Signed)
[place] DEPT Provider Note   CSN: [id number] Arrival date & time: 06/22/17  2126     History   Chief Complaint Chief Complaint  Patient presents with  . Urinary Retention    HPI Richard Moses is a 84 y.o. male.  The history is provided by the patient.  Illness  This is a recurrent problem. The current episode started 6 to 12 hours ago. The problem occurs constantly. The problem has not changed since onset.Pertinent negatives include no chest pain, no abdominal pain, no headaches and no shortness of breath. Nothing aggravates the symptoms. Nothing relieves the symptoms. He has tried nothing for the symptoms. The treatment provided no relief.  Foley not working since late this afternoon.    Past Medical History:  Diagnosis Date  . Abnormal MRI, cervical spine 09/29/10   Mildly abnormal MRI cervical spine demonstrating mild spondylosis and disc bulging from C4-5 down to C7-T1.  No spinal stenosis or foraminal narrowing  . Anemia   . Cervical disc herniation 09/29/10   C4-C5 and C7-T1  . Chronic kidney disease (CKD)   . Chronic left-sided headaches   . Colon polyp 2007  . Diabetes mellitus   . External hemorrhoids without mention of complication 3154  . Hyperlipidemia   . Hypertension   . Hypothyroidism 09/29/10   Untreated. Patient not interested in Rx.   . Knee pain, bilateral 02/05/2013   Pain since a fall in 2014  . MRI of brain abnormal 09/29/10   Abnormal MRI of brain, demonstrating mild atrophy  . Neck pain on left side 2012    Patient Active Problem List   Diagnosis Date Noted  . Memory disturbance 03/03/2017  . Bilateral knee pain 03/03/2017  . Bilateral cataracts 01/17/2017  . Peripheral neuropathy 11/30/2016  . Anemia of chronic disease 07/14/2016  . Protein-calorie malnutrition ([place]) 06/04/2016  . BPH (benign prostatic hypertrophy) with urinary obstruction 07/11/2014  . Bilateral hydronephrosis 04/26/2014  . Incomplete  bladder emptying 04/26/2014  . Urinary retention 04/22/2014  . Chronic kidney disease (CKD), stage IV (severe) ([place]) 04/17/2014  . Dysphagia, pharyngoesophageal phase 09/11/2012  . Primary hypertension 12/25/2009  . Elevated PSA 05/20/2009  . Diet-controlled diabetes mellitus ([place]) 02/18/2009    Past Surgical History:  Procedure Laterality Date  . MELANOMA EXCISION     left side of neck  . RHINOPLASTY     x2        Home Medications    Prior to Admission medications   Medication Sig Start Date End Date Taking? Authorizing Provider  amoxicillin-clavulanate (AUGMENTIN) 875-125 MG tablet Take 1 tablet by mouth every 12 (twelve) hours. 03/20/17   Kinnie Feil, PA-C  Barberry-Oreg Grape-Goldenseal (BERBERINE COMPLEX PO) Take 500 mg by mouth 3 (three) times daily with meals. Reported on 12/11/2015    [provider]  cephALEXin (KEFLEX) 500 MG capsule Take 1 capsule (500 mg total) by mouth 3 (three) times daily. 06/09/17   Larene Pickett, PA-C  ferrous sulfate 324 (65 Fe) MG TBEC Take by mouth.    [provider]  gabapentin (NEURONTIN) 100 MG capsule Take 1 capsule (100 mg total) by mouth 2 (two) times daily. 11/30/16   [place] Bing, DO  Multiple Vitamins-Minerals (MENS MULTIVITAMIN PLUS) TABS Take 1 tablet by mouth daily after breakfast.    [provider]  polyethylene glycol (MIRALAX / GLYCOLAX) packet Take 17 g by mouth daily. 08/20/16   Smiley Houseman, MD    Family History  Family History  Problem Relation Age of Onset  . Cancer Father 34       Renal CA  . Cancer Sister        Brain   . Cancer Brother        Brain  . Colon cancer Neg Hx   . Stomach cancer Neg Hx     Social History Social History   Tobacco Use  . Smoking status: Former Smoker    Types: Cigarettes    Last attempt to quit: 12/06/1966    Years since quitting: 50.5  . Smokeless tobacco: Never Used  Substance Use Topics  . Alcohol use: Yes    Alcohol/week: 0.0 oz      Comment: occasionally - had cut back since diabetes  . Drug use: No     Allergies   Patient has no known allergies.   Review of Systems Review of Systems  Constitutional: Negative for fever.  Respiratory: Negative for shortness of breath.   Cardiovascular: Negative for chest pain.  Gastrointestinal: Negative for abdominal pain.  Genitourinary: Positive for difficulty urinating.  Neurological: Negative for headaches.  All other systems reviewed and are negative.    Physical Exam Updated Vital Signs BP (!) 168/82 (BP Location: Right Arm)   Pulse 95   Temp 98.2 F (36.8 C) (Oral)   Resp 18   SpO2 100%   Physical Exam  Constitutional: He is oriented to person, place, and time. He appears well-developed and well-nourished. No distress.  HENT:  Head: Normocephalic and atraumatic.  Mouth/Throat: No oropharyngeal exudate.  Eyes: Conjunctivae are normal. Pupils are equal, round, and reactive to light.  Neck: Normal range of motion. Neck supple.  Cardiovascular: Normal rate, regular rhythm, normal heart sounds and intact distal pulses.  Pulmonary/Chest: Effort normal and breath sounds normal. No stridor. He has no wheezes. He has no rales.  Abdominal: Soft. Bowel sounds are normal. He exhibits no mass. There is no tenderness. There is no rebound and no guarding.  Musculoskeletal: Normal range of motion.  Neurological: He is alert and oriented to person, place, and time. He displays normal reflexes.  Skin: Skin is warm and dry. Capillary refill takes less than 2 seconds.     ED Treatments / Results  Labs (all labs ordered are listed, but only abnormal results are displayed) Labs Reviewed  CBC WITH DIFFERENTIAL/PLATELET - Abnormal; Notable for the following components:      Result Value   RBC 3.36 (*)    Hemoglobin 10.5 (*)    HCT 33.2 (*)    All other components within normal limits  URINALYSIS, ROUTINE W REFLEX MICROSCOPIC - Abnormal; Notable for the following  components:   Color, Urine RED (*)    APPearance CLOUDY (*)    Hgb urine dipstick LARGE (*)    Protein, ur 100 (*)    Leukocytes, UA SMALL (*)    All other components within normal limits  I-STAT CHEM 8, ED - Abnormal; Notable for the following components:   BUN 53 (*)    Creatinine, Ser 3.60 (*)    Glucose, Bld 120 (*)    Calcium, Ion 1.45 (*)    TCO2 21 (*)    Hemoglobin 10.5 (*)    HCT 31.0 (*)    All other components within normal limits    EKG  EKG Interpretation None       Radiology No results found.  Procedures Procedures (including critical care time)  Medications Ordered in ED Medications  lidocaine (  XYLOCAINE) 2 % jelly 1 application (1 application Urethral Given 06/22/17 2230)      Final Clinical Impressions(s) / ED Diagnoses   Follow up with urology.  Return for worsening pain, vomiting blood inability to pass urine,  fevers > 100.4 unrelieved by medication, shortness of breath, intractable vomiting, or diarrhea, abdominal pain, Inability to tolerate liquids or food, cough, altered mental status or any concerns. No signs of systemic illness or infection. The patient is nontoxic-appearing on exam and vital signs are within normal limits.    I have reviewed the triage vital signs and the nursing notes. Pertinent labs &imaging results that were available during my care of the patient were reviewed by me and considered in my medical decision making (see chart for details).  After history, exam, and medical workup I feel the patient has been appropriately medically screened and is safe for discharge home. Pertinent diagnoses were discussed with the patient. Patient was given return precautions.   Garhett Bernhard, MD 06/23/17 [phone]

## 2017-06-30 ENCOUNTER — Encounter (HOSPITAL_COMMUNITY): Payer: Self-pay | Admitting: Emergency Medicine

## 2017-06-30 ENCOUNTER — Emergency Department (HOSPITAL_COMMUNITY)
Admission: EM | Admit: 2017-06-30 | Discharge: 2017-06-30 | Disposition: A | Discharge status: Home / Self Care | Payer: Medicare HMO | Attending: Emergency Medicine | Admitting: Emergency Medicine

## 2017-06-30 ENCOUNTER — Other Ambulatory Visit: Payer: Self-pay

## 2017-06-30 DIAGNOSIS — Y92009 Unspecified place in unspecified non-institutional (private) residence as the place of occurrence of the external cause: Secondary | ICD-10-CM | POA: Diagnosis not present

## 2017-06-30 DIAGNOSIS — T839XXA Unspecified complication of genitourinary prosthetic device, implant and graft, initial encounter: Secondary | ICD-10-CM | POA: Diagnosis not present

## 2017-06-30 DIAGNOSIS — R339 Retention of urine, unspecified: Secondary | ICD-10-CM | POA: Insufficient documentation

## 2017-06-30 DIAGNOSIS — Y999 Unspecified external cause status: Secondary | ICD-10-CM | POA: Diagnosis not present

## 2017-06-30 DIAGNOSIS — N184 Chronic kidney disease, stage 4 (severe): Secondary | ICD-10-CM | POA: Diagnosis not present

## 2017-06-30 DIAGNOSIS — Y732 Prosthetic and other implants, materials and accessory gastroenterology and urology devices associated with adverse incidents: Secondary | ICD-10-CM | POA: Insufficient documentation

## 2017-06-30 DIAGNOSIS — Y939 Activity, unspecified: Secondary | ICD-10-CM | POA: Insufficient documentation

## 2017-06-30 DIAGNOSIS — E1122 Type 2 diabetes mellitus with diabetic chronic kidney disease: Secondary | ICD-10-CM | POA: Insufficient documentation

## 2017-06-30 DIAGNOSIS — T83098A Other mechanical complication of other indwelling urethral catheter, initial encounter: Secondary | ICD-10-CM | POA: Diagnosis not present

## 2017-06-30 DIAGNOSIS — I129 Hypertensive chronic kidney disease with stage 1 through stage 4 chronic kidney disease, or unspecified chronic kidney disease: Secondary | ICD-10-CM | POA: Diagnosis not present

## 2017-06-30 LAB — URINALYSIS, ROUTINE W REFLEX MICROSCOPIC
Bilirubin Urine: NEGATIVE
Glucose, UA: NEGATIVE mg/dL
KETONES UR: NEGATIVE mg/dL
Nitrite: POSITIVE — AB
PH: 6 (ref 5.0–8.0)
Protein, ur: 100 mg/dL — AB
SQUAMOUS EPITHELIAL / LPF: NONE SEEN
Specific Gravity, Urine: 1.01 (ref 1.005–1.030)

## 2017-06-30 LAB — I-STAT CHEM 8, ED
BUN: 50 mg/dL — ABNORMAL HIGH (ref 6–20)
CHLORIDE: 112 mmol/L — AB (ref 101–111)
CREATININE: 3.9 mg/dL — AB (ref 0.61–1.24)
Calcium, Ion: 1.27 mmol/L (ref 1.15–1.40)
GLUCOSE: 115 mg/dL — AB (ref 65–99)
HEMATOCRIT: 36 % — AB (ref 39.0–52.0)
Hemoglobin: 12.2 g/dL — ABNORMAL LOW (ref 13.0–17.0)
POTASSIUM: 4.2 mmol/L (ref 3.5–5.1)
Sodium: 142 mmol/L (ref 135–145)
TCO2: 20 mmol/L — ABNORMAL LOW (ref 22–32)

## 2017-06-30 MED ORDER — LIDOCAINE HCL 2 % EX GEL: 1.0000 "application " | Freq: Once | CUTANEOUS | Status: DC | PRN

## 2017-06-30 MED ORDER — BACITRACIN ZINC 500 UNIT/GM EX OINT
TOPICAL_OINTMENT | CUTANEOUS | Status: AC
  Filled 2017-06-30: qty 0.9

## 2017-06-30 NOTE — Discharge Instructions (Signed)
Follow-up with your urologist this week. °

## 2017-06-30 NOTE — ED Triage Notes (Signed)
Pt comes in complaining of urinary retention. Patient states his catheter flushed before bed, but around 0200 patient woke up with severe pain. Patient attempted to flush catheter but was unsuccessful.

## 2017-06-30 NOTE — ED Provider Notes (Signed)
[facility] DEPT Provider Note   CSN: [id number] Arrival date & time: 06/30/17  0355     History   Chief Complaint Chief Complaint  Patient presents with  . Urinary Retention    HPI Richard Moses is a 84 y.o. male.  The history is provided by the patient and a friend.  Illness  This is a new problem. The current episode started 12 to 24 hours ago. The problem occurs constantly. The problem has not changed since onset.Pertinent negatives include no chest pain, no headaches and no shortness of breath. Associated symptoms comments: Abdominal pain from distention.  Catheter has been clogged most of the day today. Nothing aggravates the symptoms. Nothing relieves the symptoms. He has tried nothing for the symptoms. The treatment provided no relief.    Past Medical History:  Diagnosis Date  . Abnormal MRI, cervical spine 09/29/10   Mildly abnormal MRI cervical spine demonstrating mild spondylosis and disc bulging from C4-5 down to C7-T1.  No spinal stenosis or foraminal narrowing  . Anemia   . Cervical disc herniation 09/29/10   C4-C5 and C7-T1  . Chronic kidney disease (CKD)   . Chronic left-sided headaches   . Colon polyp 2007  . Diabetes mellitus   . External hemorrhoids without mention of complication 7001  . Hyperlipidemia   . Hypertension   . Hypothyroidism 09/29/10   Untreated. Patient not interested in Rx.   . Knee pain, bilateral 02/05/2013   Pain since a fall in 2014  . MRI of brain abnormal 09/29/10   Abnormal MRI of brain, demonstrating mild atrophy  . Neck pain on left side 2012    Patient Active Problem List   Diagnosis Date Noted  . Memory disturbance 03/03/2017  . Bilateral knee pain 03/03/2017  . Bilateral cataracts 01/17/2017  . Peripheral neuropathy 11/30/2016  . Anemia of chronic disease 07/14/2016  . Protein-calorie malnutrition ([place]) 06/04/2016  . BPH (benign prostatic hypertrophy) with urinary obstruction 07/11/2014  .  Bilateral hydronephrosis 04/26/2014  . Incomplete bladder emptying 04/26/2014  . Urinary retention 04/22/2014  . Chronic kidney disease (CKD), stage IV (severe) ([place]) 04/17/2014  . Dysphagia, pharyngoesophageal phase 09/11/2012  . Primary hypertension 12/25/2009  . Elevated PSA 05/20/2009  . Diet-controlled diabetes mellitus ([place]) 02/18/2009    Past Surgical History:  Procedure Laterality Date  . MELANOMA EXCISION     left side of neck  . RHINOPLASTY     x2        Home Medications    Prior to Admission medications   Medication Sig Start Date End Date Taking? Authorizing Provider  amoxicillin-clavulanate (AUGMENTIN) 875-125 MG tablet Take 1 tablet by mouth every 12 (twelve) hours. 03/20/17   Kinnie Feil, PA-C  Barberry-Oreg Grape-Goldenseal (BERBERINE COMPLEX PO) Take 500 mg by mouth 3 (three) times daily with meals. Reported on 12/11/2015    [provider]  cephALEXin (KEFLEX) 500 MG capsule Take 1 capsule (500 mg total) by mouth 3 (three) times daily. 06/09/17   Larene Pickett, PA-C  ferrous sulfate 324 (65 Fe) MG TBEC Take by mouth.    [provider]  gabapentin (NEURONTIN) 100 MG capsule Take 1 capsule (100 mg total) by mouth 2 (two) times daily. 11/30/16   Hauula Bing, DO  Multiple Vitamins-Minerals (MENS MULTIVITAMIN PLUS) TABS Take 1 tablet by mouth daily after breakfast.    [provider]  polyethylene glycol (MIRALAX / GLYCOLAX) packet Take 17 g by mouth daily. 08/20/16  Smiley Houseman, MD    Family History Family History  Problem Relation Age of Onset  . Cancer Father 55       Renal CA  . Cancer Sister        Brain   . Cancer Brother        Brain  . Colon cancer Neg Hx   . Stomach cancer Neg Hx     Social History Social History   Tobacco Use  . Smoking status: Former Smoker    Types: Cigarettes    Last attempt to quit: 12/06/1966    Years since quitting: 50.6  . Smokeless tobacco: Never Used  Substance Use  Topics  . Alcohol use: Yes    Alcohol/week: 0.0 oz    Comment: occasionally - had cut back since diabetes  . Drug use: No     Allergies   Patient has no known allergies.   Review of Systems Review of Systems  Constitutional: Negative for fever.  Respiratory: Negative for shortness of breath.   Cardiovascular: Negative for chest pain.  Gastrointestinal: Positive for abdominal distention.  Genitourinary: Positive for difficulty urinating.  Neurological: Negative for headaches.  All other systems reviewed and are negative.    Physical Exam Updated Vital Signs There were no vitals taken for this visit.  Physical Exam  Constitutional: He is oriented to person, place, and time. He appears well-developed. No distress.  HENT:  Head: Normocephalic and atraumatic.  Mouth/Throat: No oropharyngeal exudate.  Eyes: Conjunctivae are normal. Pupils are equal, round, and reactive to light.  Neck: Normal range of motion. Neck supple.  Cardiovascular: Normal rate, regular rhythm, normal heart sounds and intact distal pulses.  Pulmonary/Chest: Effort normal and breath sounds normal. No stridor. He has no wheezes. He has no rales.  Abdominal: Soft. Bowel sounds are normal. He exhibits distension. He exhibits no mass. There is no tenderness. There is no rebound and no guarding.  Musculoskeletal: Normal range of motion.  Neurological: He is alert and oriented to person, place, and time.  Skin: Skin is warm and dry. Capillary refill takes less than 2 seconds.  Psychiatric: He has a normal mood and affect.  Nursing note and vitals reviewed.    ED Treatments / Results  Labs (all labs ordered are listed, but only abnormal results are displayed) Labs Reviewed  URINE CULTURE  URINALYSIS, ROUTINE W REFLEX MICROSCOPIC  I-STAT CHEM 8, ED     Radiology No results found.  Procedures Procedures (including critical care time)  Medications Ordered in ED Medications  lidocaine (XYLOCAINE) 2  % jelly 1 application (not administered)       Final Clinical Impressions(s) / ED Diagnoses   Follow up with your urologist and renal doctor.  No changes in his chronic kidney disease.    Return for worsening pain, vomiting blood inability to pass urine,  fevers > 100.4 unrelieved by medication, shortness of breath, intractable vomiting, or diarrhea, abdominal pain, Inability to tolerate liquids or food, cough, altered mental status or any concerns. No signs of systemic illness or infection. The patient is nontoxic-appearing on exam and vital signs are within normal limits.    I have reviewed the triage vital signs and the nursing notes. Pertinent labs &imaging results that were available during my care of the patient were reviewed by me and considered in my medical decision making (see chart for details).  After history, exam, and medical workup I feel the patient has been appropriately medically screened and is safe for  discharge home. Pertinent diagnoses were discussed with the patient. Patient was given return precautions.      Tyton Abdallah, MD 06/30/17 [phone]

## 2017-07-01 LAB — URINE CULTURE

## 2017-07-04 ENCOUNTER — Encounter (HOSPITAL_COMMUNITY)
Admission: RE | Admit: 2017-07-04 | Discharge: 2017-07-04 | Disposition: A | Discharge status: Home / Self Care | Payer: Medicare HMO | Source: Ambulatory Visit | Attending: Nephrology | Admitting: Nephrology

## 2017-07-04 VITALS — BP 130/67 | HR 93 | Temp 98.3°F | Resp 20

## 2017-07-04 DIAGNOSIS — N184 Chronic kidney disease, stage 4 (severe): Secondary | ICD-10-CM

## 2017-07-04 DIAGNOSIS — Z79899 Other long term (current) drug therapy: Secondary | ICD-10-CM | POA: Diagnosis not present

## 2017-07-04 DIAGNOSIS — Z5181 Encounter for therapeutic drug level monitoring: Secondary | ICD-10-CM | POA: Diagnosis not present

## 2017-07-04 DIAGNOSIS — D631 Anemia in chronic kidney disease: Secondary | ICD-10-CM | POA: Diagnosis not present

## 2017-07-04 LAB — POCT HEMOGLOBIN-HEMACUE: Hemoglobin: 11.8 g/dL — ABNORMAL LOW (ref 13.0–17.0)

## 2017-07-04 MED ORDER — EPOETIN ALFA 20000 UNIT/ML IJ SOLN
INTRAMUSCULAR | Status: AC
  Filled 2017-07-04: qty 1

## 2017-07-04 MED ORDER — EPOETIN ALFA 20000 UNIT/ML IJ SOLN
20000.0000 [IU] | INTRAMUSCULAR | Status: DC
  Administered 2017-07-04: 20000 [IU] via SUBCUTANEOUS

## 2017-07-09 ENCOUNTER — Other Ambulatory Visit: Payer: Self-pay

## 2017-07-09 ENCOUNTER — Encounter (HOSPITAL_COMMUNITY): Payer: Self-pay | Admitting: Emergency Medicine

## 2017-07-09 DIAGNOSIS — E039 Hypothyroidism, unspecified: Secondary | ICD-10-CM | POA: Insufficient documentation

## 2017-07-09 DIAGNOSIS — N390 Urinary tract infection, site not specified: Secondary | ICD-10-CM | POA: Insufficient documentation

## 2017-07-09 DIAGNOSIS — E1122 Type 2 diabetes mellitus with diabetic chronic kidney disease: Secondary | ICD-10-CM | POA: Diagnosis not present

## 2017-07-09 DIAGNOSIS — Z79899 Other long term (current) drug therapy: Secondary | ICD-10-CM | POA: Insufficient documentation

## 2017-07-09 DIAGNOSIS — T83091A Other mechanical complication of indwelling urethral catheter, initial encounter: Secondary | ICD-10-CM | POA: Diagnosis not present

## 2017-07-09 DIAGNOSIS — Y69 Unspecified misadventure during surgical and medical care: Secondary | ICD-10-CM | POA: Diagnosis not present

## 2017-07-09 DIAGNOSIS — I129 Hypertensive chronic kidney disease with stage 1 through stage 4 chronic kidney disease, or unspecified chronic kidney disease: Secondary | ICD-10-CM | POA: Diagnosis not present

## 2017-07-09 DIAGNOSIS — Z87891 Personal history of nicotine dependence: Secondary | ICD-10-CM | POA: Insufficient documentation

## 2017-07-09 DIAGNOSIS — N189 Chronic kidney disease, unspecified: Secondary | ICD-10-CM | POA: Insufficient documentation

## 2017-07-09 DIAGNOSIS — R3989 Other symptoms and signs involving the genitourinary system: Secondary | ICD-10-CM | POA: Diagnosis not present

## 2017-07-09 DIAGNOSIS — R339 Retention of urine, unspecified: Secondary | ICD-10-CM | POA: Diagnosis present

## 2017-07-09 DIAGNOSIS — T83511A Infection and inflammatory reaction due to indwelling urethral catheter, initial encounter: Secondary | ICD-10-CM | POA: Diagnosis not present

## 2017-07-09 NOTE — ED Triage Notes (Signed)
Pt has a catheter in place and states it is not draining  States it has not drained in the past 4-5 hours

## 2017-07-10 ENCOUNTER — Emergency Department (HOSPITAL_COMMUNITY)
Admission: EM | Admit: 2017-07-10 | Discharge: 2017-07-10 | Disposition: A | Discharge status: Home / Self Care | Payer: Medicare HMO | Attending: Emergency Medicine | Admitting: Emergency Medicine

## 2017-07-10 DIAGNOSIS — Y69 Unspecified misadventure during surgical and medical care: Secondary | ICD-10-CM | POA: Diagnosis not present

## 2017-07-10 DIAGNOSIS — E039 Hypothyroidism, unspecified: Secondary | ICD-10-CM | POA: Diagnosis not present

## 2017-07-10 DIAGNOSIS — N390 Urinary tract infection, site not specified: Secondary | ICD-10-CM | POA: Diagnosis not present

## 2017-07-10 DIAGNOSIS — E1122 Type 2 diabetes mellitus with diabetic chronic kidney disease: Secondary | ICD-10-CM | POA: Diagnosis not present

## 2017-07-10 DIAGNOSIS — R3989 Other symptoms and signs involving the genitourinary system: Secondary | ICD-10-CM | POA: Diagnosis not present

## 2017-07-10 DIAGNOSIS — T83091A Other mechanical complication of indwelling urethral catheter, initial encounter: Secondary | ICD-10-CM | POA: Diagnosis not present

## 2017-07-10 DIAGNOSIS — T83511A Infection and inflammatory reaction due to indwelling urethral catheter, initial encounter: Secondary | ICD-10-CM

## 2017-07-10 DIAGNOSIS — N189 Chronic kidney disease, unspecified: Secondary | ICD-10-CM | POA: Diagnosis not present

## 2017-07-10 DIAGNOSIS — I129 Hypertensive chronic kidney disease with stage 1 through stage 4 chronic kidney disease, or unspecified chronic kidney disease: Secondary | ICD-10-CM | POA: Diagnosis not present

## 2017-07-10 DIAGNOSIS — Z87891 Personal history of nicotine dependence: Secondary | ICD-10-CM | POA: Diagnosis not present

## 2017-07-10 LAB — URINALYSIS, ROUTINE W REFLEX MICROSCOPIC
BILIRUBIN URINE: NEGATIVE
Glucose, UA: NEGATIVE mg/dL
KETONES UR: NEGATIVE mg/dL
Nitrite: NEGATIVE
PH: 7 (ref 5.0–8.0)
PROTEIN: 100 mg/dL — AB
SQUAMOUS EPITHELIAL / LPF: NONE SEEN
Specific Gravity, Urine: 1.01 (ref 1.005–1.030)

## 2017-07-10 MED ORDER — CEPHALEXIN 500 MG PO CAPS: 500.0000 mg | ORAL_CAPSULE | Freq: Four times a day (QID) | ORAL | 0 refills | Status: DC

## 2017-07-10 MED ORDER — CEPHALEXIN 500 MG PO CAPS
1000.0000 mg | ORAL_CAPSULE | Freq: Once | ORAL | Status: AC
  Administered 2017-07-10: 1000 mg via ORAL
  Filled 2017-07-10: qty 2

## 2017-07-10 NOTE — ED Provider Notes (Signed)
[facility] DEPT Provider Note: Georgena Spurling, MD, FACEP  CSN: [id number] MRN: [id number] ARRIVAL: 07/09/17 at 2336 ROOM: [place]  Urinary Retention   HISTORY OF PRESENT ILLNESS  07/10/17 12:20 AM OLDEN KLAUER is a 84 y.o. male with a long-standing history of an indwelling Foley.  He had his indwelling Foley changed about a week ago due to obstruction.  He is here with another episode of obstruction which occurred about 5 hours ago.  Prior to that it had been draining urine without difficulty and he has been emptying his Foley bag regularly.  He states he is having severe pain in his bladder which he rates as an 8 out of 10.  It is worse with palpation.   Past Medical History:  Diagnosis Date  . Abnormal MRI, cervical spine 09/29/10   Mildly abnormal MRI cervical spine demonstrating mild spondylosis and disc bulging from C4-5 down to C7-T1.  No spinal stenosis or foraminal narrowing  . Anemia   . Cervical disc herniation 09/29/10   C4-C5 and C7-T1  . Chronic kidney disease (CKD)   . Chronic left-sided headaches   . Colon polyp 2007  . Diabetes mellitus   . External hemorrhoids without mention of complication 6237  . Hyperlipidemia   . Hypertension   . Hypothyroidism 09/29/10   Untreated. Patient not interested in Rx.   . Knee pain, bilateral 02/05/2013   Pain since a fall in 2014  . MRI of brain abnormal 09/29/10   Abnormal MRI of brain, demonstrating mild atrophy  . Neck pain on left side 2012    Past Surgical History:  Procedure Laterality Date  . MELANOMA EXCISION     left side of neck  . RHINOPLASTY     x2     Family History  Problem Relation Age of Onset  . Cancer Father 40       Renal CA  . Cancer Sister        Brain   . Cancer Brother        Brain  . Colon cancer Neg Hx   . Stomach cancer Neg Hx     Social History   Tobacco Use  . Smoking status: Former Smoker    Types: Cigarettes    Last attempt to quit: 12/06/1966    Years  since quitting: 50.6  . Smokeless tobacco: Never Used  Substance Use Topics  . Alcohol use: Yes    Alcohol/week: 0.0 oz    Comment: occasionally - had cut back since diabetes  . Drug use: No    Prior to Admission medications   Medication Sig Start Date End Date Taking? Authorizing Provider  amoxicillin-clavulanate (AUGMENTIN) 875-125 MG tablet Take 1 tablet by mouth every 12 (twelve) hours. 03/20/17   Kinnie Feil, PA-C  Barberry-Oreg Grape-Goldenseal (BERBERINE COMPLEX PO) Take 500 mg by mouth 3 (three) times daily with meals. Reported on 12/11/2015    [provider]  cephALEXin (KEFLEX) 500 MG capsule Take 1 capsule (500 mg total) by mouth 3 (three) times daily. 06/09/17   Larene Pickett, PA-C  ferrous sulfate 324 (65 Fe) MG TBEC Take by mouth.    [provider]  gabapentin (NEURONTIN) 100 MG capsule Take 1 capsule (100 mg total) by mouth 2 (two) times daily. 11/30/16   Tyler Run Bing, DO  Multiple Vitamins-Minerals (MENS MULTIVITAMIN PLUS) TABS Take 1 tablet by mouth daily after breakfast.    [provider]  polyethylene glycol (MIRALAX /  GLYCOLAX) packet Take 17 g by mouth daily. 08/20/16   Smiley Houseman, MD    Allergies Patient has no known allergies.   REVIEW OF SYSTEMS  Negative except as noted here or in the History of Present Illness.   PHYSICAL EXAMINATION  Initial Vital Signs Blood pressure (!) 186/104, pulse (!) 111, temperature 98.4 F (36.9 C), temperature source Oral, resp. rate 20, SpO2 96 %.  Examination General: Well-developed, well-nourished male in no acute distress but appears uncomfortable; appearance consistent with age of record HENT: normocephalic; atraumatic Eyes: pupils equal, round and reactive to light; extraocular muscles intact Neck: supple Heart: regular rate and rhythm Lungs: clear to auscultation bilaterally Abdomen: soft; distended, tender bladder; bowel sounds present GU: Tanner V male,  uncircumcised; Foley catheter in urethra Extremities: No deformity; full range of motion; pulses normal Neurologic: Awake, alert; motor function intact in all extremities and symmetric; no facial droop Skin: Warm and dry Psychiatric: Moaning due to discomfort   RESULTS  Summary of this visit's results, reviewed by myself:   EKG Interpretation  Date/Time:    Ventricular Rate:    PR Interval:    QRS Duration:   QT Interval:    QTC Calculation:   R Axis:     Text Interpretation:        Laboratory Studies: Results for orders placed or performed during the hospital encounter of 07/10/17 (from the past 24 hour(s))  Urinalysis, Routine w reflex microscopic- may I&O cath if menses     Status: Abnormal   Collection Time: 07/10/17 12:32 AM  Result Value Ref Range   Color, Urine YELLOW YELLOW   APPearance CLOUDY (A) CLEAR   Specific Gravity, Urine 1.010 1.005 - 1.030   pH 7.0 5.0 - 8.0   Glucose, UA NEGATIVE NEGATIVE mg/dL   Hgb urine dipstick SMALL (A) NEGATIVE   Bilirubin Urine NEGATIVE NEGATIVE   Ketones, ur NEGATIVE NEGATIVE mg/dL   Protein, ur 100 (A) NEGATIVE mg/dL   Nitrite NEGATIVE NEGATIVE   Leukocytes, UA LARGE (A) NEGATIVE   RBC / HPF 6-30 0 - 5 RBC/hpf   WBC, UA TOO NUMEROUS TO COUNT 0 - 5 WBC/hpf   Bacteria, UA MANY (A) NONE SEEN   Squamous Epithelial / LPF NONE SEEN NONE SEEN   WBC Clumps PRESENT    Non Squamous Epithelial 0-5 (A) NONE SEEN   Imaging Studies: No results found.  ED COURSE  Nursing notes and initial vitals signs, including pulse oximetry, reviewed.  Vitals:   07/09/17 2354 07/10/17 0113  BP: (!) 186/104   Pulse: (!) 111   Resp: 20   Temp: 98.4 F (36.9 C)   TempSrc: Oral   SpO2: 96%   Weight:  71.2 kg (157 lb)  Height:  6\' 1"  (1.854 m)   1:09 AM Catheter irrigated by nursing staff without relief.  Foley catheter changed by nursing staff with relief of obstruction.  About 650 mL of grossly cloudy urine obtained.  Patient's discomfort  improved but some discomfort remains.  1:22 AM Given persistent discomfort and obviously cloudy urine we will treat for catheter associated infection.  PROCEDURES    ED DIAGNOSES     ICD-10-CM   1. Obstruction of Foley catheter, initial encounter ([place]) T83.091A   2. Urinary tract infection associated with indwelling urethral catheter, initial encounter [facility]) T83.511A    N39.0        Shanon Rosser, MD 07/10/17 0347

## 2017-07-12 LAB — URINE CULTURE: Culture: >=100000 — AB

## 2017-07-13 ENCOUNTER — Telehealth: Payer: Self-pay | Admitting: Emergency Medicine

## 2017-07-13 NOTE — Telephone Encounter (Signed)
Post ED Visit - Positive Culture Follow-up: Successful Patient Follow-Up  Culture assessed and recommendations reviewed by: []  Elenor Quinones, Pharm.D. []  Heide Guile, Pharm.D., BCPS AQ-ID [x]  Parks Neptune, Pharm.D., BCPS []  Alycia Rossetti, Pharm.D., BCPS []  [place], Pharm.D., BCPS, AAHIVP []  Legrand Como, Pharm.D., BCPS, AAHIVP []  Salome Arnt, PharmD, BCPS []  Dimitri Ped, PharmD, BCPS []  Vincenza Hews, PharmD, BCPS  Positive urine culture  []  Patient discharged without antimicrobial prescription and treatment is now indicated [x]  Organism is resistant to prescribed ED discharge antimicrobial []  Patient with positive blood cultures  Changes discussed with ED provider: Wyn Quaker PA New antibiotic prescription stop cephalexin, start fosfomycin 3 grams q 48 hours x 3 doses Called to [facility] [phone]  Contacted patient, 07/13/2017 1307   Richard Moses 07/13/2017, 1:05 PM

## 2017-07-13 NOTE — Progress Notes (Signed)
ED Antimicrobial Stewardship Positive Culture Follow Up   Richard Moses is an 84 y.o. male who presented to [facility] on 07/10/2017 with a chief complaint of  Chief Complaint  Patient presents with  . Urinary Retention    Recent Results (from the past 720 hour(s))  Urine C&S     Status: Abnormal   Collection Time: 06/30/17  4:03 AM  Result Value Ref Range Status   Specimen Description URINE, CATHETERIZED  Final   Special Requests NONE  Final   Culture MULTIPLE SPECIES PRESENT, SUGGEST RECOLLECTION (A)  Final   Report Status 07/01/2017 FINAL  Final  Urine culture     Status: Abnormal   Collection Time: 07/10/17  1:00 AM  Result Value Ref Range Status   Specimen Description   Final    URINE, CATHETERIZED Performed at [facility], [place] [address] [place], [place] [address]    Special Requests   Final    NONE Performed at [facility], [place] [address], [place], [place] [address]    Culture >=100,000 COLONIES/mL PROTEUS MIRABILIS (A)  Final   Report Status 07/12/2017 FINAL  Final   Organism ID, Bacteria PROTEUS MIRABILIS (A)  Final      Susceptibility   Proteus mirabilis - MIC*    AMPICILLIN >=32 RESISTANT Resistant     CEFAZOLIN >=64 RESISTANT Resistant     CEFTRIAXONE >=64 RESISTANT Resistant     CIPROFLOXACIN >=4 RESISTANT Resistant     GENTAMICIN <=1 SENSITIVE Sensitive     IMIPENEM <=0.25 SENSITIVE Sensitive     NITROFURANTOIN RESISTANT Resistant     TRIMETH/SULFA >=320 RESISTANT Resistant     AMPICILLIN/SULBACTAM 4 SENSITIVE Sensitive     PIP/TAZO <=4 SENSITIVE Sensitive     * >=100,000 COLONIES/mL PROTEUS MIRABILIS    [x]  Treated with cephalexin, organism resistant to prescribed antimicrobial []  Patient discharged originally without antimicrobial agent and treatment is now indicated  New antibiotic prescription: fosfomycin 3 g q48h x 3 doses  ED Provider: Wyn Quaker, PA-C  [name] 07/13/2017, 9:33  AM Infectious Diseases Pharmacist Phone# [phone]

## 2017-07-14 DIAGNOSIS — N401 Enlarged prostate with lower urinary tract symptoms: Secondary | ICD-10-CM | POA: Diagnosis not present

## 2017-07-14 DIAGNOSIS — R339 Retention of urine, unspecified: Secondary | ICD-10-CM | POA: Diagnosis not present

## 2017-07-18 ENCOUNTER — Encounter (HOSPITAL_COMMUNITY)
Admission: RE | Admit: 2017-07-18 | Discharge: 2017-07-18 | Disposition: A | Discharge status: Home / Self Care | Payer: Medicare HMO | Source: Ambulatory Visit | Attending: Nephrology | Admitting: Nephrology

## 2017-07-18 VITALS — BP 145/73 | HR 93 | Temp 98.2°F | Resp 20

## 2017-07-18 DIAGNOSIS — D631 Anemia in chronic kidney disease: Secondary | ICD-10-CM | POA: Diagnosis not present

## 2017-07-18 DIAGNOSIS — N184 Chronic kidney disease, stage 4 (severe): Secondary | ICD-10-CM | POA: Insufficient documentation

## 2017-07-18 DIAGNOSIS — Z79899 Other long term (current) drug therapy: Secondary | ICD-10-CM | POA: Diagnosis not present

## 2017-07-18 DIAGNOSIS — Z5181 Encounter for therapeutic drug level monitoring: Secondary | ICD-10-CM | POA: Insufficient documentation

## 2017-07-18 LAB — IRON AND TIBC
IRON: 88 ug/dL (ref 45–182)
Saturation Ratios: 30 % (ref 17.9–39.5)
TIBC: 293 ug/dL (ref 250–450)
UIBC: 205 ug/dL

## 2017-07-18 LAB — POCT HEMOGLOBIN-HEMACUE: Hemoglobin: 11.8 g/dL — ABNORMAL LOW (ref 13.0–17.0)

## 2017-07-18 LAB — RENAL FUNCTION PANEL
Albumin: 3.5 g/dL (ref 3.5–5.0)
Anion gap: 12 (ref 5–15)
BUN: 50 mg/dL — ABNORMAL HIGH (ref 6–20)
CALCIUM: 9.9 mg/dL (ref 8.9–10.3)
CHLORIDE: 105 mmol/L (ref 101–111)
CO2: 23 mmol/L (ref 22–32)
CREATININE: 3.73 mg/dL — AB (ref 0.61–1.24)
GFR calc Af Amer: 16 mL/min — ABNORMAL LOW (ref >60)
GFR, EST NON AFRICAN AMERICAN: 14 mL/min — AB (ref >60)
Glucose, Bld: 104 mg/dL — ABNORMAL HIGH (ref 65–99)
Phosphorus: 5 mg/dL — ABNORMAL HIGH (ref 2.5–4.6)
Potassium: 4.1 mmol/L (ref 3.5–5.1)
SODIUM: 140 mmol/L (ref 135–145)

## 2017-07-18 LAB — FERRITIN: Ferritin: 119 ng/mL (ref 24–336)

## 2017-07-18 LAB — MAGNESIUM: Magnesium: 3.5 mg/dL — ABNORMAL HIGH (ref 1.7–2.4)

## 2017-07-18 MED ORDER — EPOETIN ALFA 20000 UNIT/ML IJ SOLN
20000.0000 [IU] | INTRAMUSCULAR | Status: DC
  Administered 2017-07-18: 20000 [IU] via SUBCUTANEOUS

## 2017-07-18 MED ORDER — EPOETIN ALFA 20000 UNIT/ML IJ SOLN
INTRAMUSCULAR | Status: AC
  Administered 2017-07-18: 20000 [IU] via SUBCUTANEOUS
  Filled 2017-07-18: qty 1

## 2017-07-21 DIAGNOSIS — H524 Presbyopia: Secondary | ICD-10-CM | POA: Diagnosis not present

## 2017-07-21 DIAGNOSIS — H532 Diplopia: Secondary | ICD-10-CM | POA: Diagnosis not present

## 2017-07-21 DIAGNOSIS — E119 Type 2 diabetes mellitus without complications: Secondary | ICD-10-CM | POA: Diagnosis not present

## 2017-07-21 DIAGNOSIS — H52203 Unspecified astigmatism, bilateral: Secondary | ICD-10-CM | POA: Diagnosis not present

## 2017-07-21 DIAGNOSIS — H2513 Age-related nuclear cataract, bilateral: Secondary | ICD-10-CM | POA: Diagnosis not present
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
## 2017-07-27 ENCOUNTER — Encounter: Payer: Self-pay | Admitting: Family Medicine

## 2017-07-27 ENCOUNTER — Ambulatory Visit (INDEPENDENT_AMBULATORY_CARE_PROVIDER_SITE_OTHER): Payer: Medicare HMO | Admitting: Family Medicine

## 2017-07-27 DIAGNOSIS — R413 Other amnesia: Secondary | ICD-10-CM

## 2017-07-27 NOTE — Assessment & Plan Note (Addendum)
Chronic.  No new changes per history.  MOCA score 30/30.  Mild depression given MDQ 9 score 5.  Complete neuro exam unremarkable.  Stable gait. - Advised patient to let me know if he has any changes to his functional status or ADLs - Follow-up routine in 6 months

## 2017-07-27 NOTE — Patient Instructions (Signed)
Thank you for coming in to see [place] today. Please see below to review our plan for today's visit.  You do not have signs of dementia or cognitive impairment.  If you have difficulties with performing particular tasks which is new, please let me know.    Please call the clinic at [phone] if your symptoms worsen or you have any concerns. It was our pleasure to serve you.  [place], [place], PGY-2

## 2017-07-28 DIAGNOSIS — R339 Retention of urine, unspecified: Secondary | ICD-10-CM | POA: Diagnosis not present

## 2017-08-01 ENCOUNTER — Encounter (HOSPITAL_COMMUNITY)
Admission: RE | Admit: 2017-08-01 | Discharge: 2017-08-01 | Disposition: A | Discharge status: Home / Self Care | Payer: Medicare HMO | Source: Ambulatory Visit | Attending: Nephrology | Admitting: Nephrology

## 2017-08-01 VITALS — BP 141/73 | Temp 98.5°F

## 2017-08-01 DIAGNOSIS — N184 Chronic kidney disease, stage 4 (severe): Secondary | ICD-10-CM

## 2017-08-01 LAB — POCT HEMOGLOBIN-HEMACUE: Hemoglobin: 11.3 g/dL — ABNORMAL LOW (ref 13.0–17.0)

## 2017-08-01 MED ORDER — EPOETIN ALFA 20000 UNIT/ML IJ SOLN
INTRAMUSCULAR | Status: AC
  Administered 2017-08-01: 20000 [IU] via SUBCUTANEOUS
  Filled 2017-08-01: qty 1

## 2017-08-01 MED ORDER — EPOETIN ALFA 20000 UNIT/ML IJ SOLN
20000.0000 [IU] | INTRAMUSCULAR | Status: DC
  Administered 2017-08-01: 20000 [IU] via SUBCUTANEOUS

## 2017-08-02 LAB — PTH, INTACT AND CALCIUM
Calcium, Total (PTH): 9 mg/dL (ref 8.6–10.2)
PTH: 27 pg/mL (ref 15–65)

## 2017-08-04 ENCOUNTER — Ambulatory Visit: Payer: Medicare HMO | Admitting: Gastroenterology

## 2017-08-04 ENCOUNTER — Encounter: Payer: Self-pay | Admitting: Gastroenterology

## 2017-08-04 VITALS — BP 136/70 | HR 92 | Ht 71.75 in | Wt 159.2 lb

## 2017-08-04 DIAGNOSIS — Z86010 Personal history of colonic polyps: Secondary | ICD-10-CM | POA: Diagnosis not present

## 2017-08-04 DIAGNOSIS — R1310 Dysphagia, unspecified: Secondary | ICD-10-CM

## 2017-08-04 MED ORDER — NA SULFATE-K SULFATE-MG SULF 17.5-3.13-1.6 GM/177ML PO SOLN: 1.0000 | Freq: Once | ORAL | 0 refills | Status: AC

## 2017-08-04 NOTE — Progress Notes (Signed)
HPI :  84 year old male with history as outlined below, here for follow-up visit for dysphasia.  He was last seen this past June 2018 for dysphasia.  His symptoms at that time were concerning for oral pharyngeal dysphasia.  Modified barium swallow on 11/15/2016 - seemed to show oropharyngeal dysphagia causing his symptoms. Speech path recommended that the pt should take difficult meds with pudding to facilitate clearing. They recommended continuing regular diet and thin liquids, taking small bites and sips at a slow rate.   He has been using these measures and does not have much dysphasia in the oropharyngeal area any longer, but at times he does have trouble with large pills, but now saying he has some dysphasia in his lower esophagus with solids.  He states he ate Janine Limbo recently and something got stuck, he had a drink water to push it down.  He has had an EGD in 2014 showing a distal esophageal stricture which was dilated.  He reported benefit in his symptoms to that.  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  He is wondering if this is related to his oral pharyngeal dysphasia, which is possible.  He states he has some occasional reflux symptoms but is not significant.  He denies any abdominal pains, no bowel habit changes.  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  I previously discussed this with him at the last visit he declined a colonoscopy.  Today he is requesting a colonoscopy for his history of polyps.  He is very active for his age and is very functional.  He states he wishes to have one more colonoscopy.  EGD 10/25/12 - stricture dilated to 54Fr Maloney, normal stomach. Duodenum nl, hiatal hernia Colonoscopy - 10/20/05 - sigmoid polyp - 1cm TVA, hemorrhoids    Past Medical History:  Diagnosis Date  . Abnormal MRI, cervical spine 09/29/10   Mildly abnormal MRI cervical spine demonstrating mild spondylosis and disc bulging from C4-5 down to C7-T1.  No spinal stenosis or foraminal narrowing  . Anemia   . Cervical disc herniation 09/29/10   C4-C5 and C7-T1  . Chronic kidney disease (CKD)   . Chronic left-sided headaches   . Colon polyp 2007  . Diabetes mellitus   . External hemorrhoids without mention of complication 1660  . Hyperlipidemia   . Hypertension   . Hypothyroidism 09/29/10   Untreated. Patient not interested in Rx.   . Intermittent self-catheterization of bladder    placed every month  . Knee pain, bilateral 02/05/2013   Pain since a fall in 2014  . MRI of brain abnormal 09/29/10   Abnormal MRI of brain, demonstrating mild atrophy  . Neck pain on left side 2012     Past Surgical History:  Procedure Laterality Date  . MELANOMA EXCISION     left side of neck  . RHINOPLASTY     x2    Family History  Problem Relation Age of Onset  . Cancer Father 86       Renal CA  . Cancer Sister        Brain   . Cancer Brother        Brain  . Colon cancer Neg Hx   . Stomach cancer Neg Hx    Social History   Tobacco Use  . Smoking status: Former Smoker  Types: Cigarettes    Last attempt to quit: 12/06/1966    Years since quitting: 50.6  . Smokeless tobacco: Never Used  Substance Use Topics  . Alcohol use: Yes    Alcohol/week: 0.0 oz    Comment: occasionally - had cut back since diabetes  . Drug use: No   Current Outpatient Medications  Medication Sig Dispense Refill  . amLODipine (NORVASC) 2.5 MG tablet Take 1 tablet by mouth daily.    Jolyne Loa Grape-Goldenseal (BERBERINE COMPLEX PO) Take 500 mg by mouth 3 (three) times daily with meals. Reported on 12/11/2015    . ferrous sulfate 324 (65 Fe) MG TBEC Take by mouth.    . Multiple Vitamins-Minerals (MENS MULTIVITAMIN PLUS) TABS Take 1 tablet by mouth daily after breakfast.     No current facility-administered medications for this visit.    No Known  Allergies   Review of Systems: All systems reviewed and negative except where noted in HPI.   Lab Results  Component Value Date   WBC 7.0 06/22/2017   HGB 11.3 (L) 08/01/2017   HCT 36.0 (L) 06/30/2017   MCV 98.8 06/22/2017   PLT 265 06/22/2017    Lab Results  Component Value Date   CREATININE 3.73 (H) 07/18/2017   BUN 50 (H) 07/18/2017   NA 140 07/18/2017   K 4.1 07/18/2017   CL 105 07/18/2017   CO2 23 07/18/2017     Physical Exam: BP 136/70 (BP Location: Left Arm, Patient Position: Sitting, Cuff Size: Normal)   Pulse 92   Ht 5' 11.75" (1.822 m)   Wt 159 lb 4 oz (72.2 kg)   BMI 21.75 kg/m  Constitutional: Pleasant,well-developed, male in no acute distress. HEENT: Normocephalic and atraumatic. Conjunctivae are normal. No scleral icterus. Neck supple.  Cardiovascular: Normal rate, regular rhythm.  Pulmonary/chest: Effort normal and breath sounds normal. No wheezing, rales or rhonchi. Abdominal: Soft, nondistended, nontender. There are no masses palpable. No hepatomegaly. Extremities: no edema Lymphadenopathy: No cervical adenopathy noted. Neurological: Alert and oriented to person place and time. Skin: Skin is warm and dry. No rashes noted. Psychiatric: Normal mood and affect. Behavior is normal.   ASSESSMENT AND PLAN: 84 year old male here for reassessment following issues:  Dysphasia - I suspect multifactorial, he has a component of oropharyngeal dysphasia based on prior modified barium study, but he also has a history of distal esophageal stricture to which he found benefit in the past with dilation.  His recent symptoms are concerning more so for dysphasia from the stricture (lower dysphagia with solids).  I offered him an upper endoscopy with dilation to see if this would provide some benefit.  Following discussion of risks and benefits of EGD with dilation he wanted to proceed.  History of colon adenomas - large tubulovillous adenoma removed in 2007, he has not  had a follow-up colonoscopy since that time, even though was previously offered he previously declined.  At this time he seems to have change his mind and now wishes to have a colonoscopy.  Despite his age he is quite functional and otherwise fairly healthy.  He states his mother lived to 29.  I discussed risks and benefits of colonoscopy with him and he want to proceed.  Beechmont Cellar, MD [facility] Pager [phone]

## 2017-08-04 NOTE — Patient Instructions (Signed)
If you are age 82 or older, your body mass index should be between 23-30. Your Body mass index is 21.75 kg/m. If this is out of the aforementioned range listed, please consider follow up with your Primary Care Provider.  If you are age 101 or younger, your body mass index should be between 19-25. Your Body mass index is 21.75 kg/m. If this is out of the aformentioned range listed, please consider follow up with your Primary Care Provider.   You have been scheduled for an endoscopy and colonoscopy. Please follow the written instructions given to you at your visit today. Please pick up your prep supplies at the pharmacy within the next 1-3 days. If you use inhalers (even only as needed), please bring them with you on the day of your procedure. Your physician has requested that you go to www.startemmi.com and enter the access code given to you at your visit today. This web site gives a general overview about your procedure. However, you should still follow specific instructions given to you by our office regarding your preparation for the procedure.  Thank you for entrusting me with your care and for choosing [facility], Dr. Coats Cellar

## 2017-08-15 ENCOUNTER — Encounter (HOSPITAL_COMMUNITY)
Admission: RE | Admit: 2017-08-15 | Discharge: 2017-08-15 | Disposition: A | Discharge status: Home / Self Care | Payer: Medicare HMO | Source: Ambulatory Visit | Attending: Nephrology | Admitting: Nephrology

## 2017-08-15 VITALS — BP 132/66 | HR 91 | Temp 97.9°F | Resp 18

## 2017-08-15 DIAGNOSIS — Z79899 Other long term (current) drug therapy: Secondary | ICD-10-CM | POA: Insufficient documentation

## 2017-08-15 DIAGNOSIS — Z5181 Encounter for therapeutic drug level monitoring: Secondary | ICD-10-CM | POA: Diagnosis not present

## 2017-08-15 DIAGNOSIS — D631 Anemia in chronic kidney disease: Secondary | ICD-10-CM | POA: Insufficient documentation

## 2017-08-15 DIAGNOSIS — R339 Retention of urine, unspecified: Secondary | ICD-10-CM | POA: Diagnosis not present

## 2017-08-15 DIAGNOSIS — N184 Chronic kidney disease, stage 4 (severe): Secondary | ICD-10-CM | POA: Diagnosis not present

## 2017-08-15 LAB — RENAL FUNCTION PANEL
Albumin: 3.5 g/dL (ref 3.5–5.0)
Anion gap: 11 (ref 5–15)
BUN: 65 mg/dL — ABNORMAL HIGH (ref 6–20)
CHLORIDE: 108 mmol/L (ref 101–111)
CO2: 21 mmol/L — ABNORMAL LOW (ref 22–32)
CREATININE: 3.9 mg/dL — AB (ref 0.61–1.24)
Calcium: 9.2 mg/dL (ref 8.9–10.3)
GFR calc Af Amer: 15 mL/min — ABNORMAL LOW (ref >60)
GFR, EST NON AFRICAN AMERICAN: 13 mL/min — AB (ref >60)
GLUCOSE: 113 mg/dL — AB (ref 65–99)
Phosphorus: 5.8 mg/dL — ABNORMAL HIGH (ref 2.5–4.6)
Potassium: 4 mmol/L (ref 3.5–5.1)
Sodium: 140 mmol/L (ref 135–145)

## 2017-08-15 LAB — POCT HEMOGLOBIN-HEMACUE: HEMOGLOBIN: 12.1 g/dL — AB (ref 13.0–17.0)

## 2017-08-15 LAB — IRON AND TIBC
Iron: 73 ug/dL (ref 45–182)
Saturation Ratios: 24 % (ref 17.9–39.5)
TIBC: 302 ug/dL (ref 250–450)
UIBC: 229 ug/dL

## 2017-08-15 LAB — FERRITIN: FERRITIN: 83 ng/mL (ref 24–336)

## 2017-08-15 MED ORDER — EPOETIN ALFA 20000 UNIT/ML IJ SOLN: 20000.0000 [IU] | INTRAMUSCULAR | Status: DC

## 2017-08-29 ENCOUNTER — Encounter (HOSPITAL_COMMUNITY): Payer: Medicare HMO

## 2017-08-31 ENCOUNTER — Encounter (HOSPITAL_COMMUNITY)
Admission: RE | Admit: 2017-08-31 | Discharge: 2017-08-31 | Disposition: A | Discharge status: Home / Self Care | Payer: Medicare HMO | Source: Ambulatory Visit | Attending: Nephrology | Admitting: Nephrology

## 2017-08-31 VITALS — BP 175/58 | HR 81 | Temp 97.7°F | Resp 16

## 2017-08-31 DIAGNOSIS — N184 Chronic kidney disease, stage 4 (severe): Secondary | ICD-10-CM | POA: Diagnosis not present

## 2017-08-31 LAB — POCT HEMOGLOBIN-HEMACUE: HEMOGLOBIN: 10.4 g/dL — AB (ref 13.0–17.0)

## 2017-08-31 MED ORDER — EPOETIN ALFA 20000 UNIT/ML IJ SOLN
INTRAMUSCULAR | Status: AC
  Administered 2017-08-31: 20000 [IU] via SUBCUTANEOUS
  Filled 2017-08-31: qty 1

## 2017-08-31 MED ORDER — EPOETIN ALFA 20000 UNIT/ML IJ SOLN
20000.0000 [IU] | INTRAMUSCULAR | Status: DC
  Administered 2017-08-31: 20000 [IU] via SUBCUTANEOUS

## 2017-09-01 LAB — PTH, INTACT AND CALCIUM
Calcium, Total (PTH): 9.2 mg/dL (ref 8.6–10.2)
PTH: 21 pg/mL (ref 15–65)

## 2017-09-14 ENCOUNTER — Ambulatory Visit (HOSPITAL_COMMUNITY)
Admission: RE | Admit: 2017-09-14 | Discharge: 2017-09-14 | Disposition: A | Discharge status: Home / Self Care | Payer: Medicare HMO | Source: Ambulatory Visit | Attending: Nephrology | Admitting: Nephrology

## 2017-09-14 VITALS — BP 143/76 | HR 93 | Temp 97.2°F | Resp 16

## 2017-09-14 DIAGNOSIS — N184 Chronic kidney disease, stage 4 (severe): Secondary | ICD-10-CM | POA: Insufficient documentation

## 2017-09-14 DIAGNOSIS — D631 Anemia in chronic kidney disease: Secondary | ICD-10-CM | POA: Diagnosis present

## 2017-09-14 LAB — RENAL FUNCTION PANEL
ALBUMIN: 3.8 g/dL (ref 3.5–5.0)
Anion gap: 12 (ref 5–15)
BUN: 61 mg/dL — AB (ref 6–20)
CO2: 20 mmol/L — ABNORMAL LOW (ref 22–32)
CREATININE: 3.77 mg/dL — AB (ref 0.61–1.24)
Calcium: 9.7 mg/dL (ref 8.9–10.3)
Chloride: 110 mmol/L (ref 101–111)
GFR calc Af Amer: 16 mL/min — ABNORMAL LOW (ref >60)
GFR, EST NON AFRICAN AMERICAN: 14 mL/min — AB (ref >60)
Glucose, Bld: 116 mg/dL — ABNORMAL HIGH (ref 65–99)
Phosphorus: 4.7 mg/dL — ABNORMAL HIGH (ref 2.5–4.6)
Potassium: 4.6 mmol/L (ref 3.5–5.1)
SODIUM: 142 mmol/L (ref 135–145)

## 2017-09-14 LAB — IRON AND TIBC
IRON: 105 ug/dL (ref 45–182)
Saturation Ratios: 35 % (ref 17.9–39.5)
TIBC: 301 ug/dL (ref 250–450)
UIBC: 196 ug/dL

## 2017-09-14 LAB — FERRITIN: FERRITIN: 101 ng/mL (ref 24–336)

## 2017-09-14 MED ORDER — EPOETIN ALFA 20000 UNIT/ML IJ SOLN
20000.0000 [IU] | INTRAMUSCULAR | Status: DC
  Administered 2017-09-14: 20000 [IU] via SUBCUTANEOUS

## 2017-09-14 MED ORDER — EPOETIN ALFA 20000 UNIT/ML IJ SOLN
INTRAMUSCULAR | Status: AC
  Filled 2017-09-14: qty 1

## 2017-09-15 DIAGNOSIS — Z833 Family history of diabetes mellitus: Secondary | ICD-10-CM | POA: Diagnosis not present

## 2017-09-15 DIAGNOSIS — K219 Gastro-esophageal reflux disease without esophagitis: Secondary | ICD-10-CM | POA: Diagnosis not present

## 2017-09-15 DIAGNOSIS — G8929 Other chronic pain: Secondary | ICD-10-CM | POA: Diagnosis not present

## 2017-09-15 DIAGNOSIS — R32 Unspecified urinary incontinence: Secondary | ICD-10-CM | POA: Diagnosis not present

## 2017-09-15 DIAGNOSIS — Z809 Family history of malignant neoplasm, unspecified: Secondary | ICD-10-CM | POA: Diagnosis not present

## 2017-09-15 DIAGNOSIS — Z808 Family history of malignant neoplasm of other organs or systems: Secondary | ICD-10-CM | POA: Diagnosis not present

## 2017-09-15 DIAGNOSIS — E1165 Type 2 diabetes mellitus with hyperglycemia: Secondary | ICD-10-CM | POA: Diagnosis not present

## 2017-09-15 DIAGNOSIS — N529 Male erectile dysfunction, unspecified: Secondary | ICD-10-CM | POA: Diagnosis not present

## 2017-09-15 DIAGNOSIS — R030 Elevated blood-pressure reading, without diagnosis of hypertension: Secondary | ICD-10-CM | POA: Diagnosis not present

## 2017-09-16 LAB — POCT HEMOGLOBIN-HEMACUE: Hemoglobin: 10.7 g/dL — ABNORMAL LOW (ref 13.0–17.0)

## 2017-09-22 DIAGNOSIS — R338 Other retention of urine: Secondary | ICD-10-CM | POA: Diagnosis not present

## 2017-09-26 ENCOUNTER — Ambulatory Visit (HOSPITAL_COMMUNITY)
Admission: RE | Admit: 2017-09-26 | Discharge: 2017-09-26 | Disposition: A | Discharge status: Home / Self Care | Payer: Medicare HMO | Source: Ambulatory Visit | Attending: Nephrology | Admitting: Nephrology

## 2017-09-26 VITALS — BP 117/66 | HR 81 | Temp 98.2°F | Ht 71.0 in | Wt 159.8 lb

## 2017-09-26 DIAGNOSIS — D631 Anemia in chronic kidney disease: Secondary | ICD-10-CM | POA: Diagnosis present

## 2017-09-26 DIAGNOSIS — N184 Chronic kidney disease, stage 4 (severe): Secondary | ICD-10-CM | POA: Insufficient documentation

## 2017-09-26 LAB — POCT HEMOGLOBIN-HEMACUE: Hemoglobin: 11.1 g/dL — ABNORMAL LOW (ref 13.0–17.0)

## 2017-09-26 MED ORDER — EPOETIN ALFA 20000 UNIT/ML IJ SOLN
INTRAMUSCULAR | Status: AC
  Administered 2017-09-26: 20000 [IU] via SUBCUTANEOUS
  Filled 2017-09-26: qty 1

## 2017-09-26 MED ORDER — EPOETIN ALFA 20000 UNIT/ML IJ SOLN
20000.0000 [IU] | INTRAMUSCULAR | Status: DC
  Administered 2017-09-26: 20000 [IU] via SUBCUTANEOUS

## 2017-09-27 LAB — PTH, INTACT AND CALCIUM
Calcium, Total (PTH): 9.5 mg/dL (ref 8.6–10.2)
PTH: 13 pg/mL — ABNORMAL LOW (ref 15–65)

## 2017-09-28 ENCOUNTER — Encounter (HOSPITAL_COMMUNITY): Payer: Medicare HMO

## 2017-10-06 ENCOUNTER — Encounter: Payer: Medicare HMO | Admitting: Gastroenterology

## 2017-10-10 ENCOUNTER — Ambulatory Visit (HOSPITAL_COMMUNITY)
Admission: RE | Admit: 2017-10-10 | Discharge: 2017-10-10 | Disposition: A | Discharge status: Home / Self Care | Payer: Medicare HMO | Source: Ambulatory Visit | Attending: Nephrology | Admitting: Nephrology

## 2017-10-10 VITALS — BP 151/59 | HR 54 | Temp 97.8°F | Resp 20

## 2017-10-10 DIAGNOSIS — D631 Anemia in chronic kidney disease: Secondary | ICD-10-CM | POA: Diagnosis present

## 2017-10-10 DIAGNOSIS — N184 Chronic kidney disease, stage 4 (severe): Secondary | ICD-10-CM | POA: Diagnosis present

## 2017-10-10 LAB — POCT HEMOGLOBIN-HEMACUE: HEMOGLOBIN: 11.4 g/dL — AB (ref 13.0–17.0)

## 2017-10-10 LAB — RENAL FUNCTION PANEL
ANION GAP: 12 (ref 5–15)
Albumin: 3.6 g/dL (ref 3.5–5.0)
BUN: 57 mg/dL — ABNORMAL HIGH (ref 6–20)
CHLORIDE: 105 mmol/L (ref 101–111)
CO2: 22 mmol/L (ref 22–32)
CREATININE: 4.08 mg/dL — AB (ref 0.61–1.24)
Calcium: 9.3 mg/dL (ref 8.9–10.3)
GFR calc non Af Amer: 12 mL/min — ABNORMAL LOW (ref >60)
GFR, EST AFRICAN AMERICAN: 14 mL/min — AB (ref >60)
Glucose, Bld: 109 mg/dL — ABNORMAL HIGH (ref 65–99)
POTASSIUM: 3.8 mmol/L (ref 3.5–5.1)
Phosphorus: 4.9 mg/dL — ABNORMAL HIGH (ref 2.5–4.6)
Sodium: 139 mmol/L (ref 135–145)

## 2017-10-10 LAB — IRON AND TIBC
Iron: 44 ug/dL — ABNORMAL LOW (ref 45–182)
SATURATION RATIOS: 15 % — AB (ref 17.9–39.5)
TIBC: 302 ug/dL (ref 250–450)
UIBC: 258 ug/dL

## 2017-10-10 LAB — FERRITIN: FERRITIN: 91 ng/mL (ref 24–336)

## 2017-10-10 MED ORDER — EPOETIN ALFA 20000 UNIT/ML IJ SOLN: 20000.0000 [IU] | INTRAMUSCULAR | Status: DC

## 2017-10-10 MED ORDER — EPOETIN ALFA 20000 UNIT/ML IJ SOLN
INTRAMUSCULAR | Status: AC
  Administered 2017-10-10: 20000 [IU]
  Filled 2017-10-10: qty 1

## 2017-10-12 ENCOUNTER — Encounter: Payer: Medicare HMO | Admitting: Gastroenterology

## 2017-10-14 ENCOUNTER — Ambulatory Visit (AMBULATORY_SURGERY_CENTER): Payer: Self-pay | Admitting: *Deleted

## 2017-10-14 ENCOUNTER — Encounter: Payer: Self-pay | Admitting: Gastroenterology

## 2017-10-14 ENCOUNTER — Other Ambulatory Visit: Payer: Self-pay

## 2017-10-14 VITALS — Ht 71.0 in | Wt 160.2 lb

## 2017-10-14 DIAGNOSIS — R1310 Dysphagia, unspecified: Secondary | ICD-10-CM

## 2017-10-14 DIAGNOSIS — Z86010 Personal history of colonic polyps: Secondary | ICD-10-CM

## 2017-10-14 NOTE — Progress Notes (Signed)
No egg or soy allergy known to patient  No issues with past sedation with any surgeries  or procedures, no intubation problems  No diet pills per patient No home 02 use per patient  No blood thinners per patient  Pt. Takes vitamin that contains EDTA which helps flow of arteries.  He has been off of it for 1 week due to running out.  Has ordered more.   Pt denies issues with constipation  No A fib or A flutter  EMMI video sent to pt's e mail pt. declines

## 2017-10-19 ENCOUNTER — Encounter: Payer: Medicare HMO | Admitting: Gastroenterology

## 2017-10-19 ENCOUNTER — Ambulatory Visit (AMBULATORY_SURGERY_CENTER): Payer: Medicare HMO | Admitting: Gastroenterology

## 2017-10-19 ENCOUNTER — Other Ambulatory Visit: Payer: Self-pay

## 2017-10-19 ENCOUNTER — Encounter: Payer: Self-pay | Admitting: Gastroenterology

## 2017-10-19 VITALS — BP 152/79 | HR 74 | Temp 96.8°F | Resp 15

## 2017-10-19 DIAGNOSIS — D123 Benign neoplasm of transverse colon: Secondary | ICD-10-CM

## 2017-10-19 DIAGNOSIS — D122 Benign neoplasm of ascending colon: Secondary | ICD-10-CM | POA: Diagnosis not present

## 2017-10-19 DIAGNOSIS — D126 Benign neoplasm of colon, unspecified: Secondary | ICD-10-CM

## 2017-10-19 DIAGNOSIS — K635 Polyp of colon: Secondary | ICD-10-CM

## 2017-10-19 DIAGNOSIS — R1319 Other dysphagia: Secondary | ICD-10-CM

## 2017-10-19 DIAGNOSIS — Z86010 Personal history of colonic polyps: Secondary | ICD-10-CM | POA: Diagnosis not present

## 2017-10-19 DIAGNOSIS — K222 Esophageal obstruction: Secondary | ICD-10-CM | POA: Diagnosis not present

## 2017-10-19 DIAGNOSIS — K2970 Gastritis, unspecified, without bleeding: Secondary | ICD-10-CM | POA: Diagnosis not present

## 2017-10-19 DIAGNOSIS — R1310 Dysphagia, unspecified: Secondary | ICD-10-CM | POA: Diagnosis not present

## 2017-10-19 DIAGNOSIS — D120 Benign neoplasm of cecum: Secondary | ICD-10-CM

## 2017-10-19 DIAGNOSIS — E119 Type 2 diabetes mellitus without complications: Secondary | ICD-10-CM | POA: Diagnosis not present

## 2017-10-19 DIAGNOSIS — K219 Gastro-esophageal reflux disease without esophagitis: Secondary | ICD-10-CM | POA: Diagnosis not present

## 2017-10-19 DIAGNOSIS — I10 Essential (primary) hypertension: Secondary | ICD-10-CM | POA: Diagnosis not present

## 2017-10-19 DIAGNOSIS — D649 Anemia, unspecified: Secondary | ICD-10-CM | POA: Diagnosis not present

## 2017-10-19 MED ORDER — SODIUM CHLORIDE 0.9 % IV SOLN: 500.0000 mL | Freq: Once | INTRAVENOUS | Status: DC

## 2017-10-19 NOTE — Patient Instructions (Signed)
YOU HAD AN ENDOSCOPIC PROCEDURE TODAY AT [facility]:   Refer to the procedure report that was given to you for any specific questions about what was found during the examination.  If the procedure report does not answer your questions, please call your gastroenterologist to clarify.  If you requested that your care partner not be given the details of your procedure findings, then the procedure report has been included in a sealed envelope for you to review at your convenience later.  YOU SHOULD EXPECT: Some feelings of bloating in the abdomen. Passage of more gas than usual.  Walking can help get rid of the air that was put into your GI tract during the procedure and reduce the bloating. If you had a lower endoscopy (such as a colonoscopy or flexible sigmoidoscopy) you may notice spotting of blood in your stool or on the toilet paper. If you underwent a bowel prep for your procedure, you may not have a normal bowel movement for a few days.  Please Note:  You might notice some irritation and congestion in your nose or some drainage.  This is from the oxygen used during your procedure.  There is no need for concern and it should clear up in a day or so.  SYMPTOMS TO REPORT IMMEDIATELY:   Following lower endoscopy (colonoscopy or flexible sigmoidoscopy):  Excessive amounts of blood in the stool  Significant tenderness or worsening of abdominal pains  Swelling of the abdomen that is new, acute  Fever of 100F or higher   Following upper endoscopy (EGD)  Vomiting of blood or coffee ground material  New chest pain or pain under the shoulder blades  Painful or persistently difficult swallowing  New shortness of breath  Fever of 100F or higher  Black, tarry-looking stools  Please see handouts given to you on Polyps, Hemorrhoids, Hiatal Hernia and Post Dilation Diet.  For urgent or emergent issues, a gastroenterologist can be reached at any hour by calling [phone]  [phone].   DIET:  Please follow the post dilation diet sheet. Nothing by mouth until 12:00. Clear liquids to start today at 12:00, then start the diet at 1:00 for the rest of the day. Tomorrow  you may proceed to your regular diet.  Drink plenty of fluids but you should avoid alcoholic beverages for 24 hours.  ACTIVITY:  You should plan to take it easy for the rest of today and you should NOT DRIVE or use heavy machinery until tomorrow (because of the sedation medicines used during the test).    FOLLOW UP: Our staff will call the number listed on your records the next business day following your procedure to check on you and address any questions or concerns that you may have regarding the information given to you following your procedure. If we do not reach you, we will leave a message.  However, if you are feeling well and you are not experiencing any problems, there is no need to return our call.  We will assume that you have returned to your regular daily activities without incident.  If any biopsies were taken you will be contacted by phone or by letter within the next 1-3 weeks.  Please call us at [phone] if you have not heard about the biopsies in 3 weeks.    SIGNATURES/CONFIDENTIALITY: You and/or your care partner have signed paperwork which will be entered into your electronic medical record.  These signatures attest to the fact that that the information above  on your After Visit Summary has been reviewed and is understood.  Full responsibility of the confidentiality of this discharge information lies with you and/or your care-partner.  Thank you for letting us take care of your healthcare needs today.

## 2017-10-19 NOTE — Op Note (Signed)
[place] Patient Name: Richard Moses Procedure Date: 10/19/2017 10:01 AM MRN: [id number] Endoscopist: Remo Lipps P. Armbruster MD, MD Age: 84 Referring MD:  Date of Birth: Sep 02, 1932 Gender: Male Account #: [id number] Procedure:                Colonoscopy Indications:              High risk colon cancer surveillance: Personal                            history of colonic polyps (tubulovillous adenoma                            removed in 2007) Medicines:                Monitored Anesthesia Care Procedure:                Pre-Anesthesia Assessment:                           - Prior to the procedure, a History and Physical                            was performed, and patient medications and                            allergies were reviewed. The patient's tolerance of                            previous anesthesia was also reviewed. The risks                            and benefits of the procedure and the sedation                            options and risks were discussed with the patient.                            All questions were answered, and informed consent                            was obtained. Prior Anticoagulants: The patient has                            taken no previous anticoagulant or antiplatelet                            agents. ASA Grade Assessment: III - A patient with                            severe systemic disease. After reviewing the risks                            and benefits, the patient was deemed in  satisfactory condition to undergo the procedure.                           After obtaining informed consent, the colonoscope                            was passed under direct vision. Throughout the                            procedure, the patient's blood pressure, pulse, and                            oxygen saturations were monitored continuously. The                            Model PCF-[id number]) scope was  introduced                            through the anus and advanced to the the cecum,                            identified by appendiceal orifice and ileocecal                            valve. The colonoscopy was performed without                            difficulty. The patient tolerated the procedure                            well. The quality of the bowel preparation was                            adequate. The ileocecal valve, appendiceal orifice,                            and rectum were photographed. Scope In: 10:18:29 AM Scope Out: 10:38:56 AM Scope Withdrawal Time: 0 hours 16 minutes 10 seconds  Total Procedure Duration: 0 hours 20 minutes 27 seconds  Findings:                 The perianal and digital rectal examinations were                            normal.                           A 10 to 12 mm polyp was found in the ileocecal                            valve. The polyp was sessile. The polyp was removed                            with a cold snare. Resection and retrieval were  complete.                           A 5 mm polyp was found in the ascending colon. The                            polyp was sessile. The polyp was removed with a                            cold snare. Resection and retrieval were complete.                           Two sessile polyps were found in the transverse                            colon. The polyps were 4 to 6 mm in size. These                            polyps were removed with a cold snare. Resection                            and retrieval were complete.                           Internal hemorrhoids were found during                            retroflexion. The hemorrhoids were large.                           The exam was otherwise without abnormality. Complications:            No immediate complications. Estimated blood loss:                            Minimal. Estimated Blood Loss:     Estimated blood  loss was minimal. Impression:               - One 10 to 12 mm polyp at the ileocecal valve,                            removed with a cold snare. Resected and retrieved.                           - One 5 mm polyp in the ascending colon, removed                            with a cold snare. Resected and retrieved.                           - Two 4 to 6 mm polyps in the transverse colon,                            removed with a cold snare.  Resected and retrieved.                           - Internal hemorrhoids.                           - The examination was otherwise normal. Recommendation:           - Patient has a contact number available for                            emergencies. The signs and symptoms of potential                            delayed complications were discussed with the                            patient. Return to normal activities tomorrow.                            Written discharge instructions were provided to the                            patient.                           - Resume previous diet.                           - Continue present medications.                           - Await pathology results. Remo Lipps P. Armbruster MD, MD 10/19/2017 10:44:02 AM This report has been signed electronically.

## 2017-10-19 NOTE — Progress Notes (Signed)
Called to room to assist during endoscopic procedure.  Patient ID and intended procedure confirmed with present staff. Received instructions for my participation in the procedure from the performing physician.  

## 2017-10-19 NOTE — Op Note (Signed)
[place] Patient Name: Richard Moses Procedure Date: 10/19/2017 10:02 AM MRN: [id number] Endoscopist: Remo Lipps P. Maeola Mchaney MD, MD Age: 84 Referring MD:  Date of Birth: [birth year] Gender: Male Account #: [id number] Procedure:                Upper GI endoscopy Indications:              Dysphagia Medicines:                Monitored Anesthesia Care Procedure:                Pre-Anesthesia Assessment:                           - Prior to the procedure, a History and Physical                            was performed, and patient medications and                            allergies were reviewed. The patient's tolerance of                            previous anesthesia was also reviewed. The risks                            and benefits of the procedure and the sedation                            options and risks were discussed with the patient.                            All questions were answered, and informed consent                            was obtained. Prior Anticoagulants: The patient has                            taken no previous anticoagulant or antiplatelet                            agents. ASA Grade Assessment: III - A patient with                            severe systemic disease. After reviewing the risks                            and benefits, the patient was deemed in                            satisfactory condition to undergo the procedure.                           After obtaining informed consent, the endoscope was  passed under direct vision. Throughout the                            procedure, the patient's blood pressure, pulse, and                            oxygen saturations were monitored continuously. The                            Endoscope was introduced through the mouth, and                            advanced to the second part of duodenum. The upper                            GI endoscopy was accomplished without  difficulty.                            The patient tolerated the procedure well. Scope In: Scope Out: Findings:                 Esophagogastric landmarks were identified: the                            Z-line was found at 41 cm, the gastroesophageal                            junction was found at 41 cm and the upper extent of                            the gastric folds was found at 43 cm from the                            incisors.                           A 2 cm hiatal hernia was present.                           One benign-appearing, intrinsic moderate stenosis                            was found 41 cm from the incisors. This stenosis                            measured less than one cm (in length). The stenosis                            was traversed. A TTS dilator was passed through the                            scope. Dilation with a 16-17-18 mm balloon dilator  was performed to 16 mm, 17 mm and then 18 mm after                            which an appropriate mucosal wrent was noted.                            Subtle area of nodularity noted at 12 o'clock                            position of the stricture, I suspect inflammatory.                            Biopsies were taken with a cold forceps for                            histology.                           The exam of the esophagus was otherwise normal.                           Patchy moderately erythematous mucosa was found in                            the gastric body. Biopsies were taken with a cold                            forceps from the antrum, body, and insura for                            Helicobacter pylori testing.                           The exam of the stomach was otherwise normal.                           The duodenal bulb and second portion of the                            duodenum were normal. Complications:            No immediate complications. Estimated blood  loss:                            Minimal. Estimated Blood Loss:     Estimated blood loss was minimal. Impression:               - Esophagogastric landmarks identified.                           - 2 cm hiatal hernia.                           - Benign-appearing esophageal stenosis. Dilated to  88mm with good result. Biopsied.                           - Erythematous mucosa in the gastric body. Biopsied                            to rule out H pylori                           - Normal duodenal bulb and second portion of the                            duodenum. Recommendation:           - Patient has a contact number available for                            emergencies. The signs and symptoms of potential                            delayed complications were discussed with the                            patient. Return to normal activities tomorrow.                            Written discharge instructions were provided to the                            patient.                           - Post dilation diet.                           - Continue present medications.                           - Await pathology results and course following                            dilation [name] [name] MD, MD 10/19/2017 10:50:00 AM This report has been signed electronically.

## 2017-10-20 ENCOUNTER — Telehealth: Payer: Self-pay

## 2017-10-20 DIAGNOSIS — H4921 Sixth [abducent] nerve palsy, right eye: Secondary | ICD-10-CM | POA: Diagnosis not present

## 2017-10-20 NOTE — Telephone Encounter (Signed)
  Follow up Call-  Call Georjean Toya number 10/19/2017  Post procedure Call Rashida Ladouceur phone  # [phone]  Permission to leave phone message Yes  Some recent data might be hidden     Patient questions:  Do you have a fever, pain , or abdominal swelling? No. Pain Score  0 *  Have you tolerated food without any problems? Yes.    Have you been able to return to your normal activities? Yes.    Do you have any questions about your discharge instructions: Diet   No. Medications  No. Follow up visit  No.  Do you have questions or concerns about your Care? No.  Actions: * If pain score is 4 or above: No action needed, pain <4.

## 2017-10-20 NOTE — Telephone Encounter (Signed)
Left message

## 2017-10-24 ENCOUNTER — Ambulatory Visit (HOSPITAL_COMMUNITY)
Admission: RE | Admit: 2017-10-24 | Discharge: 2017-10-24 | Disposition: A | Discharge status: Home / Self Care | Payer: Medicare HMO | Source: Ambulatory Visit | Attending: Nephrology | Admitting: Nephrology

## 2017-10-24 VITALS — BP 130/66 | HR 68 | Temp 98.4°F | Resp 20

## 2017-10-24 DIAGNOSIS — D631 Anemia in chronic kidney disease: Secondary | ICD-10-CM | POA: Diagnosis not present

## 2017-10-24 DIAGNOSIS — N184 Chronic kidney disease, stage 4 (severe): Secondary | ICD-10-CM | POA: Insufficient documentation

## 2017-10-24 LAB — MAGNESIUM: Magnesium: 2.8 mg/dL — ABNORMAL HIGH (ref 1.7–2.4)

## 2017-10-24 LAB — POCT HEMOGLOBIN-HEMACUE: Hemoglobin: 11.5 g/dL — ABNORMAL LOW (ref 13.0–17.0)

## 2017-10-24 MED ORDER — SODIUM CHLORIDE 0.9 % IV SOLN
510.0000 mg | Freq: Once | INTRAVENOUS | Status: AC
  Administered 2017-10-24: 510 mg via INTRAVENOUS
  Filled 2017-10-24: qty 17

## 2017-10-24 MED ORDER — EPOETIN ALFA 10000 UNIT/ML IJ SOLN
INTRAMUSCULAR | Status: AC
  Filled 2017-10-24: qty 1

## 2017-10-24 MED ORDER — EPOETIN ALFA 10000 UNIT/ML IJ SOLN
10000.0000 [IU] | INTRAMUSCULAR | Status: DC
  Administered 2017-10-24: 10000 [IU] via SUBCUTANEOUS

## 2017-10-25 ENCOUNTER — Encounter: Payer: Self-pay | Admitting: Gastroenterology

## 2017-10-26 DIAGNOSIS — N184 Chronic kidney disease, stage 4 (severe): Secondary | ICD-10-CM | POA: Diagnosis not present

## 2017-10-26 DIAGNOSIS — D631 Anemia in chronic kidney disease: Secondary | ICD-10-CM | POA: Diagnosis not present

## 2017-10-26 DIAGNOSIS — N2581 Secondary hyperparathyroidism of renal origin: Secondary | ICD-10-CM | POA: Diagnosis not present

## 2017-10-26 DIAGNOSIS — I129 Hypertensive chronic kidney disease with stage 1 through stage 4 chronic kidney disease, or unspecified chronic kidney disease: Secondary | ICD-10-CM | POA: Diagnosis not present

## 2017-10-28 ENCOUNTER — Other Ambulatory Visit: Payer: Self-pay | Admitting: Nephrology

## 2017-10-28 DIAGNOSIS — N185 Chronic kidney disease, stage 5: Secondary | ICD-10-CM

## 2017-11-01 ENCOUNTER — Encounter (HOSPITAL_COMMUNITY)
Admission: RE | Admit: 2017-11-01 | Discharge: 2017-11-01 | Disposition: A | Discharge status: Home / Self Care | Payer: Medicare HMO | Source: Ambulatory Visit | Attending: Nephrology | Admitting: Nephrology

## 2017-11-01 DIAGNOSIS — D649 Anemia, unspecified: Secondary | ICD-10-CM | POA: Insufficient documentation

## 2017-11-01 MED ORDER — FERUMOXYTOL INJECTION 510 MG/17 ML
510.0000 mg | Freq: Once | INTRAVENOUS | Status: AC
  Administered 2017-11-01: 510 mg via INTRAVENOUS
  Filled 2017-11-01: qty 17

## 2017-11-01 MED ORDER — SODIUM CHLORIDE 0.9 % IV SOLN: 510.0000 mg | Freq: Once | INTRAVENOUS | Status: DC

## 2017-11-03 ENCOUNTER — Ambulatory Visit
Admission: RE | Admit: 2017-11-03 | Discharge: 2017-11-03 | Disposition: A | Discharge status: Home / Self Care | Payer: Medicare HMO | Source: Ambulatory Visit | Attending: Nephrology | Admitting: Nephrology

## 2017-11-03 DIAGNOSIS — N185 Chronic kidney disease, stage 5: Secondary | ICD-10-CM

## 2017-11-03 DIAGNOSIS — N281 Cyst of kidney, acquired: Secondary | ICD-10-CM | POA: Diagnosis not present

## 2017-11-07 ENCOUNTER — Ambulatory Visit (HOSPITAL_COMMUNITY)
Admission: RE | Admit: 2017-11-07 | Discharge: 2017-11-07 | Disposition: A | Discharge status: Home / Self Care | Payer: Medicare HMO | Source: Ambulatory Visit | Attending: Nephrology | Admitting: Nephrology

## 2017-11-07 VITALS — BP 125/67 | HR 85 | Temp 98.7°F | Resp 20

## 2017-11-07 DIAGNOSIS — D631 Anemia in chronic kidney disease: Secondary | ICD-10-CM | POA: Diagnosis present

## 2017-11-07 DIAGNOSIS — N189 Chronic kidney disease, unspecified: Secondary | ICD-10-CM | POA: Diagnosis present

## 2017-11-07 DIAGNOSIS — N184 Chronic kidney disease, stage 4 (severe): Secondary | ICD-10-CM

## 2017-11-07 LAB — RENAL FUNCTION PANEL
Albumin: 3.6 g/dL (ref 3.5–5.0)
Anion gap: 12 (ref 5–15)
BUN: 75 mg/dL — AB (ref 6–20)
CHLORIDE: 112 mmol/L — AB (ref 101–111)
CO2: 19 mmol/L — AB (ref 22–32)
CREATININE: 4.33 mg/dL — AB (ref 0.61–1.24)
Calcium: 9.8 mg/dL (ref 8.9–10.3)
GFR calc Af Amer: 13 mL/min — ABNORMAL LOW (ref >60)
GFR calc non Af Amer: 11 mL/min — ABNORMAL LOW (ref >60)
GLUCOSE: 100 mg/dL — AB (ref 65–99)
POTASSIUM: 4.2 mmol/L (ref 3.5–5.1)
Phosphorus: 5.9 mg/dL — ABNORMAL HIGH (ref 2.5–4.6)
Sodium: 143 mmol/L (ref 135–145)

## 2017-11-07 LAB — IRON AND TIBC
Iron: 188 ug/dL — ABNORMAL HIGH (ref 45–182)
Saturation Ratios: 70 % — ABNORMAL HIGH (ref 17.9–39.5)
TIBC: 270 ug/dL (ref 250–450)
UIBC: 82 ug/dL

## 2017-11-07 LAB — FERRITIN: FERRITIN: 558 ng/mL — AB (ref 24–336)

## 2017-11-07 LAB — POCT HEMOGLOBIN-HEMACUE: HEMOGLOBIN: 11.8 g/dL — AB (ref 13.0–17.0)

## 2017-11-07 MED ORDER — EPOETIN ALFA 10000 UNIT/ML IJ SOLN
10000.0000 [IU] | INTRAMUSCULAR | Status: DC
  Administered 2017-11-07: 10000 [IU] via SUBCUTANEOUS

## 2017-11-07 MED ORDER — EPOETIN ALFA 10000 UNIT/ML IJ SOLN
INTRAMUSCULAR | Status: AC
  Filled 2017-11-07: qty 1

## 2017-11-10 DIAGNOSIS — R3914 Feeling of incomplete bladder emptying: Secondary | ICD-10-CM | POA: Diagnosis not present

## 2017-11-10 DIAGNOSIS — N1339 Other hydronephrosis: Secondary | ICD-10-CM | POA: Diagnosis not present

## 2017-11-21 ENCOUNTER — Ambulatory Visit (HOSPITAL_COMMUNITY)
Admission: RE | Admit: 2017-11-21 | Discharge: 2017-11-21 | Disposition: A | Discharge status: Home / Self Care | Payer: Medicare HMO | Source: Ambulatory Visit | Attending: Nephrology | Admitting: Nephrology

## 2017-11-21 VITALS — BP 144/76 | HR 92 | Temp 98.6°F | Resp 20

## 2017-11-21 DIAGNOSIS — D631 Anemia in chronic kidney disease: Secondary | ICD-10-CM | POA: Diagnosis not present

## 2017-11-21 DIAGNOSIS — N189 Chronic kidney disease, unspecified: Secondary | ICD-10-CM | POA: Diagnosis present

## 2017-11-21 DIAGNOSIS — N184 Chronic kidney disease, stage 4 (severe): Secondary | ICD-10-CM

## 2017-11-21 LAB — MAGNESIUM: Magnesium: 3.1 mg/dL — ABNORMAL HIGH (ref 1.7–2.4)

## 2017-11-21 LAB — POCT HEMOGLOBIN-HEMACUE: Hemoglobin: 11.8 g/dL — ABNORMAL LOW (ref 13.0–17.0)

## 2017-11-21 MED ORDER — EPOETIN ALFA 10000 UNIT/ML IJ SOLN
10000.0000 [IU] | INTRAMUSCULAR | Status: DC
Start: 2017-11-21 — End: 2017-11-22
  Administered 2017-11-21: 10000 [IU] via SUBCUTANEOUS

## 2017-11-21 MED ORDER — EPOETIN ALFA 10000 UNIT/ML IJ SOLN
INTRAMUSCULAR | Status: AC
  Filled 2017-11-21: qty 1

## 2017-12-05 ENCOUNTER — Ambulatory Visit (HOSPITAL_COMMUNITY)
Admission: RE | Admit: 2017-12-05 | Discharge: 2017-12-05 | Disposition: A | Discharge status: Home / Self Care | Payer: Medicare HMO | Source: Ambulatory Visit | Attending: Nephrology | Admitting: Nephrology

## 2017-12-05 VITALS — BP 131/75 | HR 85 | Temp 98.2°F | Ht 71.0 in | Wt 154.0 lb

## 2017-12-05 DIAGNOSIS — D631 Anemia in chronic kidney disease: Secondary | ICD-10-CM | POA: Insufficient documentation

## 2017-12-05 DIAGNOSIS — N189 Chronic kidney disease, unspecified: Secondary | ICD-10-CM | POA: Insufficient documentation

## 2017-12-05 DIAGNOSIS — N184 Chronic kidney disease, stage 4 (severe): Secondary | ICD-10-CM

## 2017-12-05 LAB — FERRITIN: Ferritin: 462 ng/mL — ABNORMAL HIGH (ref 24–336)

## 2017-12-05 LAB — RENAL FUNCTION PANEL
ANION GAP: 14 (ref 5–15)
Albumin: 3.7 g/dL (ref 3.5–5.0)
BUN: 69 mg/dL — ABNORMAL HIGH (ref 8–23)
CALCIUM: 9.8 mg/dL (ref 8.9–10.3)
CO2: 20 mmol/L — AB (ref 22–32)
Chloride: 107 mmol/L (ref 98–111)
Creatinine, Ser: 4.45 mg/dL — ABNORMAL HIGH (ref 0.61–1.24)
GFR calc non Af Amer: 11 mL/min — ABNORMAL LOW (ref >60)
GFR, EST AFRICAN AMERICAN: 13 mL/min — AB (ref >60)
GLUCOSE: 116 mg/dL — AB (ref 70–99)
POTASSIUM: 4.1 mmol/L (ref 3.5–5.1)
Phosphorus: 5.7 mg/dL — ABNORMAL HIGH (ref 2.5–4.6)
Sodium: 141 mmol/L (ref 135–145)

## 2017-12-05 LAB — IRON AND TIBC
Iron: 110 ug/dL (ref 45–182)
SATURATION RATIOS: 41 % — AB (ref 17.9–39.5)
TIBC: 267 ug/dL (ref 250–450)
UIBC: 157 ug/dL

## 2017-12-05 LAB — POCT HEMOGLOBIN-HEMACUE: HEMOGLOBIN: 11.5 g/dL — AB (ref 13.0–17.0)

## 2017-12-05 MED ORDER — EPOETIN ALFA 10000 UNIT/ML IJ SOLN
INTRAMUSCULAR | Status: AC
  Filled 2017-12-05: qty 1

## 2017-12-05 MED ORDER — EPOETIN ALFA 10000 UNIT/ML IJ SOLN
10000.0000 [IU] | INTRAMUSCULAR | Status: DC
  Administered 2017-12-05: 10000 [IU] via SUBCUTANEOUS

## 2017-12-10 DIAGNOSIS — Z85820 Personal history of malignant melanoma of skin: Secondary | ICD-10-CM | POA: Insufficient documentation

## 2017-12-10 DIAGNOSIS — E039 Hypothyroidism, unspecified: Secondary | ICD-10-CM | POA: Diagnosis not present

## 2017-12-10 DIAGNOSIS — I129 Hypertensive chronic kidney disease with stage 1 through stage 4 chronic kidney disease, or unspecified chronic kidney disease: Secondary | ICD-10-CM | POA: Diagnosis not present

## 2017-12-10 DIAGNOSIS — T83518A Infection and inflammatory reaction due to other urinary catheter, initial encounter: Secondary | ICD-10-CM | POA: Insufficient documentation

## 2017-12-10 DIAGNOSIS — Y829 Unspecified medical devices associated with adverse incidents: Secondary | ICD-10-CM | POA: Insufficient documentation

## 2017-12-10 DIAGNOSIS — T83511A Infection and inflammatory reaction due to indwelling urethral catheter, initial encounter: Secondary | ICD-10-CM | POA: Diagnosis not present

## 2017-12-10 DIAGNOSIS — E1122 Type 2 diabetes mellitus with diabetic chronic kidney disease: Secondary | ICD-10-CM | POA: Diagnosis not present

## 2017-12-10 DIAGNOSIS — N184 Chronic kidney disease, stage 4 (severe): Secondary | ICD-10-CM | POA: Diagnosis not present

## 2017-12-10 DIAGNOSIS — Z87891 Personal history of nicotine dependence: Secondary | ICD-10-CM | POA: Diagnosis not present

## 2017-12-11 ENCOUNTER — Emergency Department (HOSPITAL_COMMUNITY)
Admission: EM | Admit: 2017-12-11 | Discharge: 2017-12-11 | Disposition: A | Discharge status: Home / Self Care | Payer: Medicare HMO | Attending: Emergency Medicine | Admitting: Emergency Medicine

## 2017-12-11 ENCOUNTER — Other Ambulatory Visit: Payer: Self-pay

## 2017-12-11 DIAGNOSIS — E1122 Type 2 diabetes mellitus with diabetic chronic kidney disease: Secondary | ICD-10-CM | POA: Diagnosis not present

## 2017-12-11 DIAGNOSIS — I129 Hypertensive chronic kidney disease with stage 1 through stage 4 chronic kidney disease, or unspecified chronic kidney disease: Secondary | ICD-10-CM | POA: Diagnosis not present

## 2017-12-11 DIAGNOSIS — Z85820 Personal history of malignant melanoma of skin: Secondary | ICD-10-CM | POA: Diagnosis not present

## 2017-12-11 DIAGNOSIS — N184 Chronic kidney disease, stage 4 (severe): Secondary | ICD-10-CM | POA: Diagnosis not present

## 2017-12-11 DIAGNOSIS — Y829 Unspecified medical devices associated with adverse incidents: Secondary | ICD-10-CM | POA: Diagnosis not present

## 2017-12-11 DIAGNOSIS — T83511A Infection and inflammatory reaction due to indwelling urethral catheter, initial encounter: Secondary | ICD-10-CM

## 2017-12-11 DIAGNOSIS — Z87891 Personal history of nicotine dependence: Secondary | ICD-10-CM | POA: Diagnosis not present

## 2017-12-11 DIAGNOSIS — E039 Hypothyroidism, unspecified: Secondary | ICD-10-CM | POA: Diagnosis not present

## 2017-12-11 DIAGNOSIS — N390 Urinary tract infection, site not specified: Secondary | ICD-10-CM

## 2017-12-11 DIAGNOSIS — T83518A Infection and inflammatory reaction due to other urinary catheter, initial encounter: Secondary | ICD-10-CM | POA: Diagnosis not present

## 2017-12-11 LAB — URINALYSIS, ROUTINE W REFLEX MICROSCOPIC
Bilirubin Urine: NEGATIVE
GLUCOSE, UA: 50 mg/dL — AB
KETONES UR: NEGATIVE mg/dL
Nitrite: POSITIVE — AB
PROTEIN: 100 mg/dL — AB
Specific Gravity, Urine: 1.013 (ref 1.005–1.030)
pH: 6 (ref 5.0–8.0)

## 2017-12-11 MED ORDER — CIPROFLOXACIN HCL 500 MG PO TABS: 500.0000 mg | ORAL_TABLET | Freq: Two times a day (BID) | ORAL | 0 refills | Status: DC

## 2017-12-11 NOTE — ED Provider Notes (Signed)
[place] DEPT Provider Note   CSN: [id number] Arrival date & time: 12/10/17  2341     History   Chief Complaint Chief Complaint  Patient presents with  . clogged catheter    HPI Richard Moses is a 84 y.o. male.  84 yo M with a cc of a clogged catheter, noted this afternoon.  Patient is supposed to get this catheter exchanged in 3 days time.  He denies fevers denies vomiting.  Started having worsening suprapubic pain and called 911.  Denies hematuria.  The history is provided by the patient.  Illness  This is a new problem. The current episode started yesterday. The problem occurs constantly. The problem has not changed since onset.Associated symptoms include abdominal pain. Pertinent negatives include no chest pain, no headaches and no shortness of breath. Nothing aggravates the symptoms. Nothing relieves the symptoms. He has tried nothing for the symptoms. The treatment provided no relief.    Past Medical History:  Diagnosis Date  . Abnormal MRI, cervical spine 09/29/10   Mildly abnormal MRI cervical spine demonstrating mild spondylosis and disc bulging from C4-5 down to C7-T1.  No spinal stenosis or foraminal narrowing  . Anemia   . Arthritis   . Blood transfusion without reported diagnosis   . Cancer [facility])    possible melanoma on lt.leg  . Cataract    beginning stage  . Cervical disc herniation 09/29/10   C4-C5 and C7-T1  . Chronic kidney disease (CKD)   . Chronic left-sided headaches   . Colon polyp 2007  . Diabetes mellitus   . External hemorrhoids without mention of complication 6834  . GERD (gastroesophageal reflux disease)   . Hyperlipidemia   . Hypertension   . Hypothyroidism 09/29/10   Untreated. Patient not interested in Rx.   . Intermittent self-catheterization of bladder    placed every month  . Knee pain, bilateral 02/05/2013   Pain since a fall in 2014  . MRI of brain abnormal 09/29/10   Abnormal MRI of brain,  demonstrating mild atrophy  . Neck pain on left side 2012    Patient Active Problem List   Diagnosis Date Noted  . Memory disturbance 03/03/2017  . Bilateral knee pain 03/03/2017  . Bilateral cataracts 01/17/2017  . Peripheral neuropathy 11/30/2016  . Anemia of chronic disease 07/14/2016  . Protein-calorie malnutrition ([place]) 06/04/2016  . BPH (benign prostatic hypertrophy) with urinary obstruction 07/11/2014  . Bilateral hydronephrosis 04/26/2014  . Incomplete bladder emptying 04/26/2014  . Urinary retention 04/22/2014  . Chronic kidney disease (CKD), stage IV (severe) ([place]) 04/17/2014  . Dysphagia, pharyngoesophageal phase 09/11/2012  . Primary hypertension 12/25/2009  . Elevated PSA 05/20/2009  . Diet-controlled diabetes mellitus ([place]) 02/18/2009    Past Surgical History:  Procedure Laterality Date  . COLONOSCOPY    . MELANOMA EXCISION     left side of neck  . POLYPECTOMY    . RHINOPLASTY     x2         Home Medications    Prior to Admission medications   Medication Sig Start Date End Date Taking? Authorizing Provider  Barberry-Oreg Grape-Goldenseal (BERBERINE COMPLEX PO) Take 500 mg by mouth 3 (three) times daily with meals. Reported on 12/11/2015    [provider]  ciprofloxacin (CIPRO) 500 MG tablet Take 1 tablet (500 mg total) by mouth 2 (two) times daily. 12/11/17   Deno Etienne, DO  ferrous sulfate 324 (65 Fe) MG TBEC Take by mouth.    [provider]  Multiple Vitamins-Minerals (MENS MULTIVITAMIN PLUS) TABS Take 1 tablet by mouth daily after breakfast.    [provider]    Family History Family History  Problem Relation Age of Onset  . Cancer Father 73       Renal CA  . Cancer Sister        Brain   . Cancer Brother        Brain  . Colon cancer Neg Hx   . Stomach cancer Neg Hx   . Colon polyps Neg Hx   . Esophageal cancer Neg Hx   . Rectal cancer Neg Hx     Social History Social History   Tobacco Use  . Smoking status:  Former Smoker    Types: Cigarettes    Last attempt to quit: 12/06/1966    Years since quitting: 51.0  . Smokeless tobacco: Never Used  Substance Use Topics  . Alcohol use: Yes    Alcohol/week: 0.0 oz    Comment: occasionally - had cut back since diabetes  . Drug use: No     Allergies   Patient has no known allergies.   Review of Systems Review of Systems  Constitutional: Negative for chills and fever.  HENT: Negative for congestion and facial swelling.   Eyes: Negative for discharge and visual disturbance.  Respiratory: Negative for shortness of breath.   Cardiovascular: Negative for chest pain and palpitations.  Gastrointestinal: Positive for abdominal pain. Negative for diarrhea and vomiting.  Genitourinary: Positive for difficulty urinating.  Musculoskeletal: Negative for arthralgias and myalgias.  Skin: Negative for color change and rash.  Neurological: Negative for tremors, syncope and headaches.  Psychiatric/Behavioral: Negative for confusion and dysphoric mood.     Physical Exam Updated Vital Signs BP (!) 147/79   Pulse 95   Temp 98.3 F (36.8 C) (Oral)   Resp 17   Ht 5\' 11"  (1.803 m)   Wt 69.9 kg (154 lb)   SpO2 96%   BMI 21.48 kg/m   Physical Exam  Constitutional: He is oriented to person, place, and time. He appears well-developed and well-nourished.  HENT:  Head: Normocephalic and atraumatic.  Eyes: Pupils are equal, round, and reactive to light. EOM are normal.  Neck: Normal range of motion. Neck supple. No JVD present.  Cardiovascular: Normal rate and regular rhythm. Exam reveals no gallop and no friction rub.  No murmur heard. Pulmonary/Chest: No respiratory distress. He has no wheezes.  Abdominal: He exhibits no distension and no mass. There is no tenderness. There is no rebound and no guarding.  Musculoskeletal: Normal range of motion.  Neurological: He is alert and oriented to person, place, and time.  Skin: No rash noted. No pallor.    Psychiatric: He has a normal mood and affect. His behavior is normal.  Nursing note and vitals reviewed.    ED Treatments / Results  Labs (all labs ordered are listed, but only abnormal results are displayed) Labs Reviewed  URINALYSIS, ROUTINE W REFLEX MICROSCOPIC - Abnormal; Notable for the following components:      Result Value   APPearance CLOUDY (*)    Glucose, UA 50 (*)    Hgb urine dipstick LARGE (*)    Protein, ur 100 (*)    Nitrite POSITIVE (*)    Leukocytes, UA LARGE (*)    RBC / HPF >50 (*)    WBC, UA >50 (*)    Bacteria, UA MANY (*)    All other components within normal limits  URINE CULTURE    EKG None  Radiology No results found.  Procedures Procedures (including critical care time)  Medications Ordered in ED Medications - No data to display   Initial Impression / Assessment and Plan / ED Course  I have reviewed the triage vital signs and the nursing notes.  Pertinent labs & imaging results that were available during my care of the patient were reviewed by me and considered in my medical decision making (see chart for details).     84 yo M with a cc of a catheter clogged.  Significant improvement post repeat placement.  The patient however has a very cloudy urine concerning for pyuria.  Urine was sent with positive nitrites leukocyte esterase too numerous to count bacteria.  Will start on Cipro.  Urology follow-up.   2:11 AM:  I have discussed the diagnosis/risks/treatment options with the patient and believe the pt to be eligible for discharge home to follow-up with Urology. We also discussed returning to the ED immediately if new or worsening sx occur. We discussed the sx which are most concerning (e.g., sudden worsening pain, fever, inability to tolerate by mouth) that necessitate immediate return. Medications administered to the patient during their visit and any new prescriptions provided to the patient are listed below.  Medications given during  this visit Medications - No data to display    The patient appears reasonably screen and/or stabilized for discharge and I doubt any other medical condition or other [facility] requiring further screening, evaluation, or treatment in the ED at this time prior to discharge.     Final Clinical Impressions(s) / ED Diagnoses   Final diagnoses:  Urinary tract infection associated with indwelling urethral catheter, initial encounter [facility])    ED Discharge Orders        Ordered    ciprofloxacin (CIPRO) 500 MG tablet  2 times daily     12/11/17 0201       Deno Etienne, DO 12/11/17 0211

## 2017-12-11 NOTE — ED Notes (Signed)
Provided with new leg bag at d/c

## 2017-12-11 NOTE — ED Triage Notes (Signed)
Pt from home, lives w/caregiver who is currently out of the country.  Pt drove himself here.  Pt states he tried to flush his catheter and was going to change to the night collection bag w/o success.  States he last emptied it around 10pm w/a small amount.  Prior to it was emptied around 6pm w/a larger amount.  States it's changed once/month w/it due to be changed out this coming Tuesday at [facility]. C/O itching to urethra and pain to penis, feels bladder is full, and states a stong odor to his urine over the past several days.

## 2017-12-11 NOTE — ED Notes (Signed)
Bed: WA09 Expected date:  Expected time:  Means of arrival:  Comments: 

## 2017-12-13 LAB — URINE CULTURE: Culture: >=100000 — AB

## 2017-12-14 ENCOUNTER — Telehealth: Payer: Self-pay | Admitting: Emergency Medicine

## 2017-12-14 NOTE — Telephone Encounter (Signed)
Post ED Visit - Positive Culture Follow-up  Culture report reviewed by antimicrobial stewardship pharmacist:  []  Elenor Quinones, Pharm.D. []  Heide Guile, Pharm.D., BCPS AQ-ID []  Parks Neptune, Pharm.D., BCPS []  Alycia Rossetti, Pharm.D., BCPS []  [place], [place].D., BCPS, AAHIVP []  Legrand Como, Pharm.D., BCPS, AAHIVP [x]  Salome Arnt, PharmD, BCPS []  Johnnette Gourd, PharmD, BCPS []  Hughes Better, PharmD, BCPS []  Leeroy Cha, PharmD  Positive urine culture Treated with ciprofloxacin, organism sensitive to the same and no further patient follow-up is required at this time.  Hazle Nordmann 12/14/2017, 10:58 AM

## 2017-12-15 DIAGNOSIS — N401 Enlarged prostate with lower urinary tract symptoms: Secondary | ICD-10-CM | POA: Diagnosis not present

## 2017-12-15 DIAGNOSIS — R3914 Feeling of incomplete bladder emptying: Secondary | ICD-10-CM | POA: Diagnosis not present

## 2017-12-19 ENCOUNTER — Ambulatory Visit (HOSPITAL_COMMUNITY)
Admission: RE | Admit: 2017-12-19 | Discharge: 2017-12-19 | Disposition: A | Discharge status: Home / Self Care | Payer: Medicare HMO | Source: Ambulatory Visit | Attending: Nephrology | Admitting: Nephrology

## 2017-12-19 VITALS — BP 143/70 | HR 89 | Temp 98.0°F

## 2017-12-19 DIAGNOSIS — D631 Anemia in chronic kidney disease: Secondary | ICD-10-CM | POA: Diagnosis present

## 2017-12-19 DIAGNOSIS — N189 Chronic kidney disease, unspecified: Secondary | ICD-10-CM | POA: Diagnosis present

## 2017-12-19 DIAGNOSIS — N184 Chronic kidney disease, stage 4 (severe): Secondary | ICD-10-CM

## 2017-12-19 LAB — MAGNESIUM: Magnesium: 2.8 mg/dL — ABNORMAL HIGH (ref 1.7–2.4)

## 2017-12-19 LAB — POCT HEMOGLOBIN-HEMACUE: Hemoglobin: 11.5 g/dL — ABNORMAL LOW (ref 13.0–17.0)

## 2017-12-19 MED ORDER — EPOETIN ALFA 10000 UNIT/ML IJ SOLN
INTRAMUSCULAR | Status: AC
  Filled 2017-12-19: qty 1

## 2017-12-19 MED ORDER — EPOETIN ALFA 10000 UNIT/ML IJ SOLN
10000.0000 [IU] | INTRAMUSCULAR | Status: DC
  Administered 2017-12-19: 10000 [IU] via SUBCUTANEOUS

## 2017-12-23 ENCOUNTER — Telehealth: Payer: Self-pay | Admitting: Family Medicine

## 2017-12-23 NOTE — Telephone Encounter (Signed)
LVM to schedule f/u appt. Please help with setting this up

## 2018-01-02 ENCOUNTER — Ambulatory Visit (HOSPITAL_COMMUNITY)
Admission: RE | Admit: 2018-01-02 | Discharge: 2018-01-02 | Disposition: A | Discharge status: Home / Self Care | Payer: Medicare HMO | Source: Ambulatory Visit | Attending: Nephrology | Admitting: Nephrology

## 2018-01-02 VITALS — BP 143/62 | HR 88 | Temp 98.0°F | Resp 20

## 2018-01-02 DIAGNOSIS — N184 Chronic kidney disease, stage 4 (severe): Secondary | ICD-10-CM

## 2018-01-02 DIAGNOSIS — D631 Anemia in chronic kidney disease: Secondary | ICD-10-CM | POA: Insufficient documentation

## 2018-01-02 DIAGNOSIS — N189 Chronic kidney disease, unspecified: Secondary | ICD-10-CM | POA: Diagnosis not present

## 2018-01-02 LAB — POCT HEMOGLOBIN-HEMACUE: HEMOGLOBIN: 11.3 g/dL — AB (ref 13.0–17.0)

## 2018-01-02 MED ORDER — EPOETIN ALFA 10000 UNIT/ML IJ SOLN: 10000.0000 [IU] | INTRAMUSCULAR | Status: DC

## 2018-01-02 MED ORDER — EPOETIN ALFA 10000 UNIT/ML IJ SOLN
INTRAMUSCULAR | Status: AC
  Administered 2018-01-02: 10000 [IU]
  Filled 2018-01-02: qty 1

## 2018-01-10 DIAGNOSIS — R3914 Feeling of incomplete bladder emptying: Secondary | ICD-10-CM | POA: Diagnosis not present

## 2018-01-12 NOTE — Progress Notes (Signed)
Subjective   Patient ID: Richard Moses    DOB: 05-30-1933, 84 y.o. male   MRN: [id number]  CC: "Fall"  HPI: Richard Moses is a 84 y.o. male who presents to clinic today for the following:  Hypertension: Patient requesting to be evaluated for for peripheral vascular disease.  He has known hypertension likely related to chronic kidney disease which is well controlled without need for medication.  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  He has no history of chest pain, shortness of breath, claudication.  He is a never smoker.  BPH: Patient currently followed by Dr. McDiarmid at [facility] urology for chronic obstructive uropathy.  He has been off his finasteride and Flomax for the last 3 months and requires an indwelling urinary catheter.  Patient states his symptoms of polyuria and urgency have significantly improved with use of catheter.  He denies change in weight, dysuria, change in bowel movements, hematuria.  Mechanical fall: Patient sustained a mechanical fall in the parking lot last week when he tripped over a parking bumper.  He subsequently fell on his right knee and scraped his nose.  Patient did endorse some epistaxis initially which resolved.  He did not have any pain with ambulation regarding his right knee.  He denies falls since his accident.  He denies history of syncope, fatigue, chest pain, shortness of breath.  ROS: see HPI for pertinent.  [place]: HTN, NIDDM, CKDIV, BPH w/ urinary retention, anemia of chronic disease, protein calorie malnutrition.Surgical historyrhinoplasty x2, melanoma excision L-neck. Family historyrenal CA, brain CA. Smoking status reviewed. Medications reviewed.  Objective   BP 122/68   Pulse 93   Temp 98.3 F (36.8 C) (Oral)   Ht 5\' 11"  (1.803 m)   Wt 152 lb 12.8 oz (69.3 kg)   SpO2 98%   BMI 21.31 kg/m  Vitals and nursing note reviewed.  General: frail elderly male, well nourished, well  developed, NAD with non-toxic appearance HEENT: normocephalic, atraumatic, moist mucous membranes, PERRLA, nose without deformity and patent nares bilaterally without signs of dental deviation, hearing aids bilaterally Cardiovascular: regular rate and rhythm without murmurs, rubs, or gallops Lungs: clear to auscultation bilaterally with normal work of breathing Abdomen: soft, non-tender, non-distended, normoactive bowel sounds GU: indwelling urinary catheter intact with yellow urine in collecting bag Skin: warm, dry, cap refill < 2 seconds Extremities: warm and well perfused, normal tone, no edema, 5/5 motor strength in all 4 extremities, well-healed scab on anterior surface of right knee without fluctuance or erythema, slow but steady gait  Assessment & Plan   Primary hypertension Chronic.  Likely related to chronic obstructive uropathy and subsequent CKD.  Patient would like to check for peripheral vascular disease.  He does have a history of diet-controlled diabetes and hypertension. - Will scheduled for ABI by Dr. Valentina Lucks  Benign non-nodular prostatic hyperplasia with lower urinary tract symptoms Chronic.  Has history of hydronephrosis due to obstructive uropathy.  Followed by Dr. McDiarmid at [facility].  Patient declined TURP and opted for chronic indwelling catheter appears to be stable.  Recently discontinued finasteride and Flomax. - Continue chronic indwelling urinary catheter and follow-up with nephrology and urology  Diet-controlled diabetes mellitus ([place]) Chronic.  Well-controlled, A1c 5.5. - We will opt to check A1c in 6 months, 07/16/2017 - Diabetic foot exam performed  Orders Placed This Encounter  Procedures  . HgB A1c   No orders of the defined types were placed in this  encounter.   Harriet Butte, [place], PGY-3 01/13/2018, 6:10 PM

## 2018-01-13 ENCOUNTER — Encounter: Payer: Self-pay | Admitting: Family Medicine

## 2018-01-13 ENCOUNTER — Ambulatory Visit (INDEPENDENT_AMBULATORY_CARE_PROVIDER_SITE_OTHER): Payer: Medicare HMO | Admitting: Family Medicine

## 2018-01-13 ENCOUNTER — Other Ambulatory Visit: Payer: Self-pay

## 2018-01-13 VITALS — BP 122/68 | HR 93 | Temp 98.3°F | Ht 71.0 in | Wt 152.8 lb

## 2018-01-13 DIAGNOSIS — N401 Enlarged prostate with lower urinary tract symptoms: Secondary | ICD-10-CM

## 2018-01-13 DIAGNOSIS — I10 Essential (primary) hypertension: Secondary | ICD-10-CM

## 2018-01-13 DIAGNOSIS — E119 Type 2 diabetes mellitus without complications: Secondary | ICD-10-CM | POA: Diagnosis not present

## 2018-01-13 DIAGNOSIS — W19XXXA Unspecified fall, initial encounter: Secondary | ICD-10-CM | POA: Diagnosis not present

## 2018-01-13 LAB — POCT GLYCOSYLATED HEMOGLOBIN (HGB A1C): HbA1c, POC (controlled diabetic range): 5.5 % (ref 0.0–7.0)

## 2018-01-13 NOTE — Assessment & Plan Note (Addendum)
Chronic.  Has history of hydronephrosis due to obstructive uropathy.  Followed by Dr. McDiarmid at [facility] urology.  Patient declined TURP and opted for chronic indwelling catheter appears to be stable.  Recently discontinued finasteride and Flomax. - Continue chronic indwelling urinary catheter and follow-up with nephrology and urology

## 2018-01-13 NOTE — Assessment & Plan Note (Addendum)
Chronic.  Well-controlled, A1c 5.5. - We will opt to check A1c in 6 months, 07/16/2017 - Diabetic foot exam performed

## 2018-01-13 NOTE — Patient Instructions (Signed)
Thank you for coming in to see [place] today. Please see below to review our plan for today's visit.  1.  We will arrange for you to undergo an ankle-brachial index which is a test to determine if you have peripheral vascular disease of your next.  This can be done here at the office with Dr. Valentina Lucks.   2.  I do not see anything abnormal regarding your nose or right knee after your fall.  If you have another incident or you fall, please let me know.  Please call the clinic at [phone] if your symptoms worsen or you have any concerns. It was our pleasure to serve you.  Harriet Butte, [place], PGY-3

## 2018-01-13 NOTE — Assessment & Plan Note (Addendum)
Chronic.  Likely related to chronic obstructive uropathy and subsequent CKD.  Patient would like to check for peripheral vascular disease.  He does have a history of diet-controlled diabetes and hypertension. - Will scheduled for ABI by Dr. Valentina Lucks

## 2018-01-16 ENCOUNTER — Encounter: Payer: Medicare HMO | Admitting: Family Medicine

## 2018-01-16 ENCOUNTER — Ambulatory Visit (HOSPITAL_COMMUNITY)
Admission: RE | Admit: 2018-01-16 | Discharge: 2018-01-16 | Disposition: A | Discharge status: Home / Self Care | Payer: Medicare HMO | Source: Ambulatory Visit | Attending: Nephrology | Admitting: Nephrology

## 2018-01-16 VITALS — BP 140/77 | HR 86 | Temp 97.6°F | Resp 16

## 2018-01-16 DIAGNOSIS — N184 Chronic kidney disease, stage 4 (severe): Secondary | ICD-10-CM

## 2018-01-16 DIAGNOSIS — D631 Anemia in chronic kidney disease: Secondary | ICD-10-CM | POA: Diagnosis present

## 2018-01-16 LAB — IRON AND TIBC
Iron: 112 ug/dL (ref 45–182)
Saturation Ratios: 41 % — ABNORMAL HIGH (ref 17.9–39.5)
TIBC: 274 ug/dL (ref 250–450)
UIBC: 162 ug/dL

## 2018-01-16 LAB — RENAL FUNCTION PANEL
ALBUMIN: 3.8 g/dL (ref 3.5–5.0)
Anion gap: 12 (ref 5–15)
BUN: 74 mg/dL — AB (ref 8–23)
CHLORIDE: 110 mmol/L (ref 98–111)
CO2: 17 mmol/L — ABNORMAL LOW (ref 22–32)
Calcium: 9.5 mg/dL (ref 8.9–10.3)
Creatinine, Ser: 4.24 mg/dL — ABNORMAL HIGH (ref 0.61–1.24)
GFR calc Af Amer: 14 mL/min — ABNORMAL LOW (ref >60)
GFR, EST NON AFRICAN AMERICAN: 12 mL/min — AB (ref >60)
GLUCOSE: 107 mg/dL — AB (ref 70–99)
POTASSIUM: 4.1 mmol/L (ref 3.5–5.1)
Phosphorus: 5.4 mg/dL — ABNORMAL HIGH (ref 2.5–4.6)
Sodium: 139 mmol/L (ref 135–145)

## 2018-01-16 LAB — MAGNESIUM: MAGNESIUM: 2.8 mg/dL — AB (ref 1.7–2.4)

## 2018-01-16 LAB — POCT HEMOGLOBIN-HEMACUE: HEMOGLOBIN: 11.8 g/dL — AB (ref 13.0–17.0)

## 2018-01-16 LAB — FERRITIN: FERRITIN: 388 ng/mL — AB (ref 24–336)

## 2018-01-16 MED ORDER — EPOETIN ALFA 10000 UNIT/ML IJ SOLN
INTRAMUSCULAR | Status: AC
  Filled 2018-01-16: qty 1

## 2018-01-16 MED ORDER — EPOETIN ALFA 10000 UNIT/ML IJ SOLN
10000.0000 [IU] | INTRAMUSCULAR | Status: DC
  Administered 2018-01-16: 10000 [IU] via SUBCUTANEOUS

## 2018-01-19 ENCOUNTER — Encounter: Payer: Self-pay | Admitting: Pharmacist

## 2018-01-19 ENCOUNTER — Ambulatory Visit (INDEPENDENT_AMBULATORY_CARE_PROVIDER_SITE_OTHER): Payer: Medicare HMO | Admitting: Pharmacist

## 2018-01-19 DIAGNOSIS — E119 Type 2 diabetes mellitus without complications: Secondary | ICD-10-CM | POA: Diagnosis not present

## 2018-01-19 NOTE — Assessment & Plan Note (Signed)
Normal ABI and low likelihood of PAD based on ABI of 1.02 in a patient with no symptoms of leg pain.  No medication adjustments.

## 2018-01-19 NOTE — Progress Notes (Signed)
    S:    Patient arrives ambulating and in good spirits. He presents to the clinic for PAD evaluation.  Patient was interested in getting checked for PAD symptoms after someone from [place] came to his house and suggested it. He denies any symptoms of leg pain.  O:  Physical Exam  Constitutional: He appears well-developed and well-nourished.  Vitals reviewed.   Review of Systems  All other systems reviewed and are negative. Slightly diminished dorsal pedal pulses bilaterally.  ABI overall = 1.02 Right Arm 142 mmHg    Left Arm 166 mmHg Right ankle posterior tibial 146 mmHg     dorsalis pedis 132 mmHg Left ankle posterior tibial 166 mmHg    dorsalis pedis 112 mmHg   A/P: Normal ABI and low likelihood of PAD based on ABI of 1.02 in a patient with no symptoms of leg pain.  No medication adjustments.   Results reviewed and written information provided.   F/U Clinic Visit with Dr. Yisroel Ramming.  Total time in face-to-face counseling 30 minutes.  Patient seen with Sharyne Peach, PharmD Candidate, Gwenlyn Found, PharmD, PGY1 Pharmacy Resident, and Catie Darnelle Maffucci, PharmD,  PGY2 Pharmacy Resident.

## 2018-01-19 NOTE — Patient Instructions (Signed)
It was great to meet you today!   The test we did today (ABI - Ankle Brachial Index) showed NORMAL blood flow to your feet.     Follow up with Dr. Yisroel Ramming.

## 2018-01-23 DIAGNOSIS — E119 Type 2 diabetes mellitus without complications: Secondary | ICD-10-CM | POA: Diagnosis not present

## 2018-01-23 DIAGNOSIS — H5201 Hypermetropia, right eye: Secondary | ICD-10-CM | POA: Diagnosis not present

## 2018-01-23 DIAGNOSIS — H52203 Unspecified astigmatism, bilateral: Secondary | ICD-10-CM | POA: Diagnosis not present

## 2018-01-23 DIAGNOSIS — H2513 Age-related nuclear cataract, bilateral: Secondary | ICD-10-CM | POA: Diagnosis not present

## 2018-01-23 DIAGNOSIS — H524 Presbyopia: Secondary | ICD-10-CM | POA: Diagnosis not present

## 2018-01-30 ENCOUNTER — Ambulatory Visit (HOSPITAL_COMMUNITY)
Admission: RE | Admit: 2018-01-30 | Discharge: 2018-01-30 | Disposition: A | Discharge status: Home / Self Care | Payer: Medicare HMO | Source: Ambulatory Visit | Attending: Nephrology | Admitting: Nephrology

## 2018-01-30 VITALS — BP 120/81 | HR 93 | Temp 97.4°F | Ht 71.0 in | Wt 152.0 lb

## 2018-01-30 DIAGNOSIS — Z79899 Other long term (current) drug therapy: Secondary | ICD-10-CM | POA: Diagnosis not present

## 2018-01-30 DIAGNOSIS — D631 Anemia in chronic kidney disease: Secondary | ICD-10-CM | POA: Diagnosis not present

## 2018-01-30 DIAGNOSIS — Z5181 Encounter for therapeutic drug level monitoring: Secondary | ICD-10-CM | POA: Insufficient documentation

## 2018-01-30 DIAGNOSIS — N184 Chronic kidney disease, stage 4 (severe): Secondary | ICD-10-CM | POA: Diagnosis not present

## 2018-01-30 LAB — POCT HEMOGLOBIN-HEMACUE: Hemoglobin: 11.7 g/dL — ABNORMAL LOW (ref 13.0–17.0)

## 2018-01-30 MED ORDER — EPOETIN ALFA 10000 UNIT/ML IJ SOLN
INTRAMUSCULAR | Status: AC
  Administered 2018-01-30: 10000 [IU] via SUBCUTANEOUS
  Filled 2018-01-30: qty 1

## 2018-01-30 MED ORDER — EPOETIN ALFA 10000 UNIT/ML IJ SOLN
10000.0000 [IU] | INTRAMUSCULAR | Status: DC
  Administered 2018-01-30: 10000 [IU] via SUBCUTANEOUS

## 2018-02-13 ENCOUNTER — Encounter (HOSPITAL_COMMUNITY): Payer: Medicare HMO

## 2018-02-15 DIAGNOSIS — R3914 Feeling of incomplete bladder emptying: Secondary | ICD-10-CM | POA: Diagnosis not present

## 2018-02-16 ENCOUNTER — Ambulatory Visit (HOSPITAL_COMMUNITY)
Admission: RE | Admit: 2018-02-16 | Discharge: 2018-02-16 | Disposition: A | Discharge status: Home / Self Care | Payer: Medicare HMO | Source: Ambulatory Visit | Attending: Nephrology | Admitting: Nephrology

## 2018-02-16 VITALS — BP 121/66 | HR 92 | Temp 98.1°F | Resp 20

## 2018-02-16 DIAGNOSIS — D631 Anemia in chronic kidney disease: Secondary | ICD-10-CM | POA: Insufficient documentation

## 2018-02-16 DIAGNOSIS — Z79899 Other long term (current) drug therapy: Secondary | ICD-10-CM | POA: Diagnosis not present

## 2018-02-16 DIAGNOSIS — N184 Chronic kidney disease, stage 4 (severe): Secondary | ICD-10-CM | POA: Diagnosis present

## 2018-02-16 DIAGNOSIS — Z5181 Encounter for therapeutic drug level monitoring: Secondary | ICD-10-CM | POA: Insufficient documentation

## 2018-02-16 LAB — POCT HEMOGLOBIN-HEMACUE: Hemoglobin: 11.2 g/dL — ABNORMAL LOW (ref 13.0–17.0)

## 2018-02-16 LAB — MAGNESIUM: MAGNESIUM: 2.2 mg/dL (ref 1.7–2.4)

## 2018-02-16 LAB — IRON AND TIBC
Iron: 122 ug/dL (ref 45–182)
SATURATION RATIOS: 50 % — AB (ref 17.9–39.5)
TIBC: 244 ug/dL — ABNORMAL LOW (ref 250–450)
UIBC: 122 ug/dL

## 2018-02-16 LAB — FERRITIN: FERRITIN: 327 ng/mL (ref 24–336)

## 2018-02-16 MED ORDER — EPOETIN ALFA 10000 UNIT/ML IJ SOLN
INTRAMUSCULAR | Status: AC
  Administered 2018-02-16: 10000 [IU] via SUBCUTANEOUS
  Filled 2018-02-16: qty 1

## 2018-02-16 MED ORDER — EPOETIN ALFA 10000 UNIT/ML IJ SOLN
10000.0000 [IU] | INTRAMUSCULAR | Status: DC
  Administered 2018-02-16: 10000 [IU] via SUBCUTANEOUS

## 2018-02-28 DIAGNOSIS — N2581 Secondary hyperparathyroidism of renal origin: Secondary | ICD-10-CM | POA: Diagnosis not present

## 2018-02-28 DIAGNOSIS — N185 Chronic kidney disease, stage 5: Secondary | ICD-10-CM | POA: Diagnosis not present

## 2018-02-28 DIAGNOSIS — D631 Anemia in chronic kidney disease: Secondary | ICD-10-CM | POA: Diagnosis not present

## 2018-02-28 DIAGNOSIS — I120 Hypertensive chronic kidney disease with stage 5 chronic kidney disease or end stage renal disease: Secondary | ICD-10-CM | POA: Diagnosis not present

## 2018-03-02 ENCOUNTER — Ambulatory Visit (HOSPITAL_COMMUNITY)
Admission: RE | Admit: 2018-03-02 | Discharge: 2018-03-02 | Disposition: A | Discharge status: Home / Self Care | Payer: Medicare HMO | Source: Ambulatory Visit | Attending: Nephrology | Admitting: Nephrology

## 2018-03-02 VITALS — BP 155/73 | HR 95 | Temp 97.9°F | Resp 20

## 2018-03-02 DIAGNOSIS — N184 Chronic kidney disease, stage 4 (severe): Secondary | ICD-10-CM | POA: Diagnosis present

## 2018-03-02 DIAGNOSIS — D631 Anemia in chronic kidney disease: Secondary | ICD-10-CM | POA: Insufficient documentation

## 2018-03-02 LAB — RENAL FUNCTION PANEL
Albumin: 3.9 g/dL (ref 3.5–5.0)
Anion gap: 12 (ref 5–15)
BUN: 54 mg/dL — AB (ref 8–23)
CHLORIDE: 110 mmol/L (ref 98–111)
CO2: 21 mmol/L — AB (ref 22–32)
CREATININE: 3.97 mg/dL — AB (ref 0.61–1.24)
Calcium: 9.4 mg/dL (ref 8.9–10.3)
GFR calc Af Amer: 15 mL/min — ABNORMAL LOW (ref >60)
GFR calc non Af Amer: 13 mL/min — ABNORMAL LOW (ref >60)
Glucose, Bld: 114 mg/dL — ABNORMAL HIGH (ref 70–99)
Phosphorus: 4.9 mg/dL — ABNORMAL HIGH (ref 2.5–4.6)
Potassium: 3.8 mmol/L (ref 3.5–5.1)
Sodium: 143 mmol/L (ref 135–145)

## 2018-03-02 MED ORDER — EPOETIN ALFA 10000 UNIT/ML IJ SOLN
INTRAMUSCULAR | Status: AC
  Filled 2018-03-02: qty 1

## 2018-03-02 MED ORDER — EPOETIN ALFA 10000 UNIT/ML IJ SOLN
10000.0000 [IU] | INTRAMUSCULAR | Status: DC
  Administered 2018-03-02: 10000 [IU] via SUBCUTANEOUS

## 2018-03-03 ENCOUNTER — Encounter: Payer: Self-pay | Admitting: Family Medicine

## 2018-03-03 DIAGNOSIS — N2581 Secondary hyperparathyroidism of renal origin: Secondary | ICD-10-CM | POA: Insufficient documentation

## 2018-03-03 LAB — POCT HEMOGLOBIN-HEMACUE: HEMOGLOBIN: 11.9 g/dL — AB (ref 13.0–17.0)

## 2018-03-14 DIAGNOSIS — R3914 Feeling of incomplete bladder emptying: Secondary | ICD-10-CM | POA: Diagnosis not present

## 2018-03-16 ENCOUNTER — Ambulatory Visit (HOSPITAL_COMMUNITY)
Admission: RE | Admit: 2018-03-16 | Discharge: 2018-03-16 | Disposition: A | Discharge status: Home / Self Care | Payer: Medicare HMO | Source: Ambulatory Visit | Attending: Nephrology | Admitting: Nephrology

## 2018-03-16 VITALS — BP 136/77 | HR 90 | Temp 97.9°F | Resp 18

## 2018-03-16 DIAGNOSIS — D631 Anemia in chronic kidney disease: Secondary | ICD-10-CM | POA: Diagnosis not present

## 2018-03-16 DIAGNOSIS — N184 Chronic kidney disease, stage 4 (severe): Secondary | ICD-10-CM | POA: Insufficient documentation

## 2018-03-16 DIAGNOSIS — N185 Chronic kidney disease, stage 5: Secondary | ICD-10-CM

## 2018-03-16 LAB — IRON AND TIBC
Iron: 123 ug/dL (ref 45–182)
Saturation Ratios: 45 % — ABNORMAL HIGH (ref 17.9–39.5)
TIBC: 273 ug/dL (ref 250–450)
UIBC: 150 ug/dL

## 2018-03-16 LAB — POCT HEMOGLOBIN-HEMACUE: HEMOGLOBIN: 12.1 g/dL — AB (ref 13.0–17.0)

## 2018-03-16 LAB — FERRITIN: Ferritin: 345 ng/mL — ABNORMAL HIGH (ref 24–336)

## 2018-03-16 LAB — MAGNESIUM: MAGNESIUM: 2.6 mg/dL — AB (ref 1.7–2.4)

## 2018-03-16 MED ORDER — EPOETIN ALFA 10000 UNIT/ML IJ SOLN: 10000.0000 [IU] | INTRAMUSCULAR | Status: DC

## 2018-03-30 ENCOUNTER — Ambulatory Visit (HOSPITAL_COMMUNITY)
Admission: RE | Admit: 2018-03-30 | Discharge: 2018-03-30 | Disposition: A | Discharge status: Home / Self Care | Payer: Medicare HMO | Source: Ambulatory Visit | Attending: Nephrology | Admitting: Nephrology

## 2018-03-30 VITALS — BP 137/72 | HR 92 | Resp 18

## 2018-03-30 DIAGNOSIS — N185 Chronic kidney disease, stage 5: Secondary | ICD-10-CM | POA: Insufficient documentation

## 2018-03-30 LAB — RENAL FUNCTION PANEL
Albumin: 3.8 g/dL (ref 3.5–5.0)
Anion gap: 12 (ref 5–15)
BUN: 70 mg/dL — ABNORMAL HIGH (ref 8–23)
CALCIUM: 9.9 mg/dL (ref 8.9–10.3)
CO2: 22 mmol/L (ref 22–32)
CREATININE: 4.23 mg/dL — AB (ref 0.61–1.24)
Chloride: 108 mmol/L (ref 98–111)
GFR, EST AFRICAN AMERICAN: 14 mL/min — AB (ref >60)
GFR, EST NON AFRICAN AMERICAN: 12 mL/min — AB (ref >60)
Glucose, Bld: 112 mg/dL — ABNORMAL HIGH (ref 70–99)
Phosphorus: 4.5 mg/dL (ref 2.5–4.6)
Potassium: 3.3 mmol/L — ABNORMAL LOW (ref 3.5–5.1)
SODIUM: 142 mmol/L (ref 135–145)

## 2018-03-30 LAB — POCT HEMOGLOBIN-HEMACUE: HEMOGLOBIN: 11.4 g/dL — AB (ref 13.0–17.0)

## 2018-03-30 MED ORDER — EPOETIN ALFA 10000 UNIT/ML IJ SOLN
INTRAMUSCULAR | Status: AC
  Administered 2018-03-30: 10000 [IU] via SUBCUTANEOUS
  Filled 2018-03-30: qty 1

## 2018-03-30 MED ORDER — EPOETIN ALFA 10000 UNIT/ML IJ SOLN
10000.0000 [IU] | INTRAMUSCULAR | Status: DC
  Administered 2018-03-30: 10000 [IU] via SUBCUTANEOUS

## 2018-04-10 DIAGNOSIS — R339 Retention of urine, unspecified: Secondary | ICD-10-CM | POA: Diagnosis not present

## 2018-04-13 ENCOUNTER — Ambulatory Visit (HOSPITAL_COMMUNITY)
Admission: RE | Admit: 2018-04-13 | Discharge: 2018-04-13 | Disposition: A | Discharge status: Home / Self Care | Payer: Medicare HMO | Source: Ambulatory Visit | Attending: Nephrology | Admitting: Nephrology

## 2018-04-13 VITALS — BP 118/70 | HR 91 | Temp 98.2°F | Resp 20

## 2018-04-13 DIAGNOSIS — N185 Chronic kidney disease, stage 5: Secondary | ICD-10-CM | POA: Insufficient documentation

## 2018-04-13 LAB — IRON AND TIBC
IRON: 122 ug/dL (ref 45–182)
Saturation Ratios: 47 % — ABNORMAL HIGH (ref 17.9–39.5)
TIBC: 260 ug/dL (ref 250–450)
UIBC: 138 ug/dL

## 2018-04-13 LAB — POCT HEMOGLOBIN-HEMACUE: Hemoglobin: 11.4 g/dL — ABNORMAL LOW (ref 13.0–17.0)

## 2018-04-13 LAB — MAGNESIUM: Magnesium: 2.7 mg/dL — ABNORMAL HIGH (ref 1.7–2.4)

## 2018-04-13 LAB — FERRITIN: Ferritin: 390 ng/mL — ABNORMAL HIGH (ref 24–336)

## 2018-04-13 MED ORDER — EPOETIN ALFA 10000 UNIT/ML IJ SOLN
INTRAMUSCULAR | Status: AC
  Administered 2018-04-13: 10000 [IU] via SUBCUTANEOUS
  Filled 2018-04-13: qty 1

## 2018-04-13 MED ORDER — EPOETIN ALFA 10000 UNIT/ML IJ SOLN
10000.0000 [IU] | INTRAMUSCULAR | Status: DC
  Administered 2018-04-13: 10000 [IU] via SUBCUTANEOUS

## 2018-04-27 ENCOUNTER — Ambulatory Visit (HOSPITAL_COMMUNITY)
Admission: RE | Admit: 2018-04-27 | Discharge: 2018-04-27 | Disposition: A | Discharge status: Home / Self Care | Payer: Medicare HMO | Source: Ambulatory Visit | Attending: Nephrology | Admitting: Nephrology

## 2018-04-27 VITALS — BP 120/71 | HR 84 | Temp 98.1°F | Resp 20

## 2018-04-27 DIAGNOSIS — N185 Chronic kidney disease, stage 5: Secondary | ICD-10-CM | POA: Insufficient documentation

## 2018-04-27 LAB — RENAL FUNCTION PANEL
Albumin: 3.6 g/dL (ref 3.5–5.0)
Anion gap: 12 (ref 5–15)
BUN: 55 mg/dL — AB (ref 8–23)
CHLORIDE: 107 mmol/L (ref 98–111)
CO2: 21 mmol/L — AB (ref 22–32)
CREATININE: 4 mg/dL — AB (ref 0.61–1.24)
Calcium: 9.9 mg/dL (ref 8.9–10.3)
GFR calc Af Amer: 15 mL/min — ABNORMAL LOW (ref >60)
GFR calc non Af Amer: 13 mL/min — ABNORMAL LOW (ref >60)
Glucose, Bld: 110 mg/dL — ABNORMAL HIGH (ref 70–99)
Phosphorus: 5.3 mg/dL — ABNORMAL HIGH (ref 2.5–4.6)
Potassium: 3.7 mmol/L (ref 3.5–5.1)
Sodium: 140 mmol/L (ref 135–145)

## 2018-04-27 MED ORDER — EPOETIN ALFA 10000 UNIT/ML IJ SOLN
10000.0000 [IU] | INTRAMUSCULAR | Status: DC
  Administered 2018-04-27: 10000 [IU] via SUBCUTANEOUS

## 2018-04-27 MED ORDER — EPOETIN ALFA 10000 UNIT/ML IJ SOLN
INTRAMUSCULAR | Status: AC
  Administered 2018-04-27: 10000 [IU] via SUBCUTANEOUS
  Filled 2018-04-27: qty 1

## 2018-04-28 DIAGNOSIS — R338 Other retention of urine: Secondary | ICD-10-CM | POA: Diagnosis not present

## 2018-04-28 LAB — POCT HEMOGLOBIN-HEMACUE: Hemoglobin: 11 g/dL — ABNORMAL LOW (ref 13.0–17.0)

## 2018-05-11 ENCOUNTER — Ambulatory Visit (HOSPITAL_COMMUNITY)
Admission: RE | Admit: 2018-05-11 | Discharge: 2018-05-11 | Disposition: A | Discharge status: Home / Self Care | Payer: Medicare HMO | Source: Ambulatory Visit | Attending: Nephrology | Admitting: Nephrology

## 2018-05-11 VITALS — BP 114/70 | HR 94 | Temp 97.9°F | Resp 20

## 2018-05-11 DIAGNOSIS — Z79899 Other long term (current) drug therapy: Secondary | ICD-10-CM | POA: Diagnosis not present

## 2018-05-11 DIAGNOSIS — D631 Anemia in chronic kidney disease: Secondary | ICD-10-CM | POA: Diagnosis not present

## 2018-05-11 DIAGNOSIS — N184 Chronic kidney disease, stage 4 (severe): Secondary | ICD-10-CM | POA: Insufficient documentation

## 2018-05-11 DIAGNOSIS — Z5181 Encounter for therapeutic drug level monitoring: Secondary | ICD-10-CM | POA: Diagnosis not present

## 2018-05-11 DIAGNOSIS — N185 Chronic kidney disease, stage 5: Secondary | ICD-10-CM

## 2018-05-11 LAB — FERRITIN: Ferritin: 411 ng/mL — ABNORMAL HIGH (ref 24–336)

## 2018-05-11 LAB — IRON AND TIBC
IRON: 90 ug/dL (ref 45–182)
SATURATION RATIOS: 32 % (ref 17.9–39.5)
TIBC: 283 ug/dL (ref 250–450)
UIBC: 193 ug/dL

## 2018-05-11 LAB — MAGNESIUM: Magnesium: 2.7 mg/dL — ABNORMAL HIGH (ref 1.7–2.4)

## 2018-05-11 LAB — POCT HEMOGLOBIN-HEMACUE: HEMOGLOBIN: 11.1 g/dL — AB (ref 13.0–17.0)

## 2018-05-11 MED ORDER — EPOETIN ALFA 10000 UNIT/ML IJ SOLN
10000.0000 [IU] | INTRAMUSCULAR | Status: DC
  Administered 2018-05-11: 10000 [IU] via SUBCUTANEOUS

## 2018-05-11 MED ORDER — EPOETIN ALFA 10000 UNIT/ML IJ SOLN
INTRAMUSCULAR | Status: AC
  Administered 2018-05-11: 10000 [IU] via SUBCUTANEOUS
  Filled 2018-05-11: qty 1

## 2018-05-25 ENCOUNTER — Ambulatory Visit (HOSPITAL_COMMUNITY)
Admission: RE | Admit: 2018-05-25 | Discharge: 2018-05-25 | Disposition: A | Discharge status: Home / Self Care | Payer: Medicare HMO | Source: Ambulatory Visit | Attending: Nephrology | Admitting: Nephrology

## 2018-05-25 VITALS — BP 138/70 | HR 87 | Temp 97.6°F

## 2018-05-25 DIAGNOSIS — N185 Chronic kidney disease, stage 5: Secondary | ICD-10-CM | POA: Insufficient documentation

## 2018-05-25 LAB — POCT HEMOGLOBIN-HEMACUE: HEMOGLOBIN: 10.5 g/dL — AB (ref 13.0–17.0)

## 2018-05-25 LAB — RENAL FUNCTION PANEL
ALBUMIN: 3.8 g/dL (ref 3.5–5.0)
ANION GAP: 11 (ref 5–15)
BUN: 57 mg/dL — ABNORMAL HIGH (ref 8–23)
CO2: 25 mmol/L (ref 22–32)
Calcium: 9.7 mg/dL (ref 8.9–10.3)
Chloride: 106 mmol/L (ref 98–111)
Creatinine, Ser: 3.73 mg/dL — ABNORMAL HIGH (ref 0.61–1.24)
GFR calc Af Amer: 16 mL/min — ABNORMAL LOW (ref >60)
GFR, EST NON AFRICAN AMERICAN: 14 mL/min — AB (ref >60)
Glucose, Bld: 108 mg/dL — ABNORMAL HIGH (ref 70–99)
PHOSPHORUS: 4.3 mg/dL (ref 2.5–4.6)
Potassium: 4.9 mmol/L (ref 3.5–5.1)
Sodium: 142 mmol/L (ref 135–145)

## 2018-05-25 MED ORDER — EPOETIN ALFA 10000 UNIT/ML IJ SOLN
INTRAMUSCULAR | Status: AC
  Filled 2018-05-25: qty 1

## 2018-05-25 MED ORDER — EPOETIN ALFA 10000 UNIT/ML IJ SOLN
10000.0000 [IU] | INTRAMUSCULAR | Status: DC
  Administered 2018-05-25: 10000 [IU] via SUBCUTANEOUS

## 2018-05-26 DIAGNOSIS — R338 Other retention of urine: Secondary | ICD-10-CM | POA: Diagnosis not present

## 2018-06-08 ENCOUNTER — Encounter (HOSPITAL_COMMUNITY)
Admission: RE | Admit: 2018-06-08 | Discharge: 2018-06-08 | Disposition: A | Discharge status: Home / Self Care | Payer: Medicare HMO | Source: Ambulatory Visit | Attending: Nephrology | Admitting: Nephrology

## 2018-06-08 VITALS — BP 94/63 | HR 96 | Temp 98.2°F | Resp 20

## 2018-06-08 DIAGNOSIS — D631 Anemia in chronic kidney disease: Secondary | ICD-10-CM | POA: Diagnosis not present

## 2018-06-08 DIAGNOSIS — N184 Chronic kidney disease, stage 4 (severe): Secondary | ICD-10-CM | POA: Insufficient documentation

## 2018-06-08 DIAGNOSIS — N185 Chronic kidney disease, stage 5: Secondary | ICD-10-CM

## 2018-06-08 LAB — FERRITIN: Ferritin: 367 ng/mL — ABNORMAL HIGH (ref 24–336)

## 2018-06-08 LAB — IRON AND TIBC
IRON: 107 ug/dL (ref 45–182)
Saturation Ratios: 39 % (ref 17.9–39.5)
TIBC: 277 ug/dL (ref 250–450)
UIBC: 170 ug/dL

## 2018-06-08 LAB — POCT HEMOGLOBIN-HEMACUE: HEMOGLOBIN: 11 g/dL — AB (ref 13.0–17.0)

## 2018-06-08 LAB — MAGNESIUM: Magnesium: 2.7 mg/dL — ABNORMAL HIGH (ref 1.7–2.4)

## 2018-06-08 MED ORDER — EPOETIN ALFA 10000 UNIT/ML IJ SOLN
INTRAMUSCULAR | Status: AC
  Filled 2018-06-08: qty 1

## 2018-06-08 MED ORDER — EPOETIN ALFA 10000 UNIT/ML IJ SOLN
10000.0000 [IU] | INTRAMUSCULAR | Status: DC
  Administered 2018-06-08: 10000 [IU] via SUBCUTANEOUS

## 2018-06-20 DIAGNOSIS — R3914 Feeling of incomplete bladder emptying: Secondary | ICD-10-CM | POA: Diagnosis not present

## 2018-06-21 DIAGNOSIS — N185 Chronic kidney disease, stage 5: Secondary | ICD-10-CM | POA: Diagnosis not present

## 2018-06-21 DIAGNOSIS — I120 Hypertensive chronic kidney disease with stage 5 chronic kidney disease or end stage renal disease: Secondary | ICD-10-CM | POA: Diagnosis not present

## 2018-06-21 DIAGNOSIS — D631 Anemia in chronic kidney disease: Secondary | ICD-10-CM | POA: Diagnosis not present

## 2018-06-21 DIAGNOSIS — N2581 Secondary hyperparathyroidism of renal origin: Secondary | ICD-10-CM | POA: Diagnosis not present

## 2018-06-22 ENCOUNTER — Ambulatory Visit (HOSPITAL_COMMUNITY)
Admission: RE | Admit: 2018-06-22 | Discharge: 2018-06-22 | Disposition: A | Discharge status: Home / Self Care | Payer: Medicare HMO | Source: Ambulatory Visit | Attending: Nephrology | Admitting: Nephrology

## 2018-06-22 VITALS — BP 138/76 | HR 95 | Temp 98.3°F | Resp 16

## 2018-06-22 DIAGNOSIS — N185 Chronic kidney disease, stage 5: Secondary | ICD-10-CM | POA: Diagnosis not present

## 2018-06-22 LAB — RENAL FUNCTION PANEL
Albumin: 3.6 g/dL (ref 3.5–5.0)
Anion gap: 12 (ref 5–15)
BUN: 57 mg/dL — ABNORMAL HIGH (ref 8–23)
CO2: 21 mmol/L — ABNORMAL LOW (ref 22–32)
Calcium: 9.4 mg/dL (ref 8.9–10.3)
Chloride: 109 mmol/L (ref 98–111)
Creatinine, Ser: 3.68 mg/dL — ABNORMAL HIGH (ref 0.61–1.24)
GFR calc Af Amer: 17 mL/min — ABNORMAL LOW (ref >60)
GFR, EST NON AFRICAN AMERICAN: 14 mL/min — AB (ref >60)
Glucose, Bld: 102 mg/dL — ABNORMAL HIGH (ref 70–99)
Phosphorus: 4 mg/dL (ref 2.5–4.6)
Potassium: 4.1 mmol/L (ref 3.5–5.1)
Sodium: 142 mmol/L (ref 135–145)

## 2018-06-22 MED ORDER — EPOETIN ALFA 10000 UNIT/ML IJ SOLN
INTRAMUSCULAR | Status: AC
  Filled 2018-06-22: qty 1

## 2018-06-22 MED ORDER — EPOETIN ALFA 10000 UNIT/ML IJ SOLN
10000.0000 [IU] | INTRAMUSCULAR | Status: DC
  Administered 2018-06-22: 10000 [IU] via SUBCUTANEOUS

## 2018-06-23 DIAGNOSIS — M21612 Bunion of left foot: Secondary | ICD-10-CM | POA: Diagnosis not present

## 2018-06-23 DIAGNOSIS — M2042 Other hammer toe(s) (acquired), left foot: Secondary | ICD-10-CM | POA: Diagnosis not present

## 2018-06-23 DIAGNOSIS — M21611 Bunion of right foot: Secondary | ICD-10-CM | POA: Diagnosis not present

## 2018-06-23 DIAGNOSIS — M2041 Other hammer toe(s) (acquired), right foot: Secondary | ICD-10-CM | POA: Diagnosis not present

## 2018-06-23 DIAGNOSIS — L84 Corns and callosities: Secondary | ICD-10-CM | POA: Diagnosis not present

## 2018-06-23 LAB — POCT HEMOGLOBIN-HEMACUE: Hemoglobin: 11.1 g/dL — ABNORMAL LOW (ref 13.0–17.0)

## 2018-07-06 ENCOUNTER — Ambulatory Visit (HOSPITAL_COMMUNITY)
Admission: RE | Admit: 2018-07-06 | Discharge: 2018-07-06 | Disposition: A | Discharge status: Home / Self Care | Payer: Medicare HMO | Source: Ambulatory Visit | Attending: Nephrology | Admitting: Nephrology

## 2018-07-06 VITALS — BP 123/75 | HR 94 | Temp 98.2°F | Resp 20

## 2018-07-06 DIAGNOSIS — N185 Chronic kidney disease, stage 5: Secondary | ICD-10-CM | POA: Diagnosis present

## 2018-07-06 LAB — POCT HEMOGLOBIN-HEMACUE: Hemoglobin: 11.4 g/dL — ABNORMAL LOW (ref 13.0–17.0)

## 2018-07-06 MED ORDER — EPOETIN ALFA 10000 UNIT/ML IJ SOLN
INTRAMUSCULAR | Status: AC
  Administered 2018-07-06: 10000 [IU] via SUBCUTANEOUS
  Filled 2018-07-06: qty 1

## 2018-07-06 MED ORDER — EPOETIN ALFA 10000 UNIT/ML IJ SOLN
10000.0000 [IU] | INTRAMUSCULAR | Status: DC
  Administered 2018-07-06: 10000 [IU] via SUBCUTANEOUS

## 2018-07-11 DIAGNOSIS — R3914 Feeling of incomplete bladder emptying: Secondary | ICD-10-CM | POA: Diagnosis not present

## 2018-07-13 DIAGNOSIS — M24574 Contracture, right foot: Secondary | ICD-10-CM | POA: Diagnosis not present

## 2018-07-20 ENCOUNTER — Ambulatory Visit (HOSPITAL_COMMUNITY)
Admission: RE | Admit: 2018-07-20 | Discharge: 2018-07-20 | Disposition: A | Discharge status: Home / Self Care | Payer: Medicare HMO | Source: Ambulatory Visit | Attending: Nephrology | Admitting: Nephrology

## 2018-07-20 VITALS — BP 104/58 | HR 101 | Temp 98.2°F | Resp 20

## 2018-07-20 DIAGNOSIS — N185 Chronic kidney disease, stage 5: Secondary | ICD-10-CM | POA: Diagnosis present

## 2018-07-20 LAB — RENAL FUNCTION PANEL
Albumin: 3.7 g/dL (ref 3.5–5.0)
Anion gap: 15 (ref 5–15)
BUN: 57 mg/dL — ABNORMAL HIGH (ref 8–23)
CO2: 21 mmol/L — ABNORMAL LOW (ref 22–32)
Calcium: 9.7 mg/dL (ref 8.9–10.3)
Chloride: 106 mmol/L (ref 98–111)
Creatinine, Ser: 4.02 mg/dL — ABNORMAL HIGH (ref 0.61–1.24)
GFR calc Af Amer: 15 mL/min — ABNORMAL LOW (ref >60)
GFR calc non Af Amer: 13 mL/min — ABNORMAL LOW (ref >60)
Glucose, Bld: 110 mg/dL — ABNORMAL HIGH (ref 70–99)
Phosphorus: 4.2 mg/dL (ref 2.5–4.6)
Potassium: 3.8 mmol/L (ref 3.5–5.1)
SODIUM: 142 mmol/L (ref 135–145)

## 2018-07-20 LAB — IRON AND TIBC
Iron: 81 ug/dL (ref 45–182)
Saturation Ratios: 31 % (ref 17.9–39.5)
TIBC: 265 ug/dL (ref 250–450)
UIBC: 184 ug/dL

## 2018-07-20 LAB — MAGNESIUM: Magnesium: 2.6 mg/dL — ABNORMAL HIGH (ref 1.7–2.4)

## 2018-07-20 LAB — FERRITIN: Ferritin: 310 ng/mL (ref 24–336)

## 2018-07-20 LAB — POCT HEMOGLOBIN-HEMACUE: Hemoglobin: 11.6 g/dL — ABNORMAL LOW (ref 13.0–17.0)

## 2018-07-20 MED ORDER — EPOETIN ALFA 10000 UNIT/ML IJ SOLN
INTRAMUSCULAR | Status: AC
  Administered 2018-07-20: 10000 [IU] via SUBCUTANEOUS
  Filled 2018-07-20: qty 1

## 2018-07-20 MED ORDER — EPOETIN ALFA 10000 UNIT/ML IJ SOLN
10000.0000 [IU] | INTRAMUSCULAR | Status: DC
  Administered 2018-07-20: 10000 [IU] via SUBCUTANEOUS

## 2018-07-28 ENCOUNTER — Encounter (HOSPITAL_COMMUNITY): Payer: Self-pay | Admitting: Emergency Medicine

## 2018-07-28 ENCOUNTER — Emergency Department (HOSPITAL_COMMUNITY)
Admission: EM | Admit: 2018-07-28 | Discharge: 2018-07-28 | Disposition: A | Discharge status: Home / Self Care | Payer: Medicare HMO | Attending: Emergency Medicine | Admitting: Emergency Medicine

## 2018-07-28 ENCOUNTER — Other Ambulatory Visit: Payer: Self-pay

## 2018-07-28 DIAGNOSIS — I120 Hypertensive chronic kidney disease with stage 5 chronic kidney disease or end stage renal disease: Secondary | ICD-10-CM | POA: Insufficient documentation

## 2018-07-28 DIAGNOSIS — E119 Type 2 diabetes mellitus without complications: Secondary | ICD-10-CM | POA: Diagnosis not present

## 2018-07-28 DIAGNOSIS — N185 Chronic kidney disease, stage 5: Secondary | ICD-10-CM | POA: Insufficient documentation

## 2018-07-28 DIAGNOSIS — Z87891 Personal history of nicotine dependence: Secondary | ICD-10-CM | POA: Insufficient documentation

## 2018-07-28 DIAGNOSIS — R1084 Generalized abdominal pain: Secondary | ICD-10-CM | POA: Diagnosis present

## 2018-07-28 DIAGNOSIS — N390 Urinary tract infection, site not specified: Secondary | ICD-10-CM | POA: Diagnosis not present

## 2018-07-28 DIAGNOSIS — N289 Disorder of kidney and ureter, unspecified: Secondary | ICD-10-CM

## 2018-07-28 DIAGNOSIS — R339 Retention of urine, unspecified: Secondary | ICD-10-CM | POA: Insufficient documentation

## 2018-07-28 DIAGNOSIS — T83511A Infection and inflammatory reaction due to indwelling urethral catheter, initial encounter: Secondary | ICD-10-CM

## 2018-07-28 LAB — CBC WITH DIFFERENTIAL/PLATELET
Abs Immature Granulocytes: 0.01 10*3/uL (ref 0.00–0.07)
Basophils Absolute: 0 10*3/uL (ref 0.0–0.1)
Basophils Relative: 1 %
Eosinophils Absolute: 0.3 10*3/uL (ref 0.0–0.5)
Eosinophils Relative: 5 %
HCT: 37.4 % — ABNORMAL LOW (ref 39.0–52.0)
Hemoglobin: 11.5 g/dL — ABNORMAL LOW (ref 13.0–17.0)
Immature Granulocytes: 0 %
Lymphocytes Relative: 40 %
Lymphs Abs: 2.6 10*3/uL (ref 0.7–4.0)
MCH: 33.1 pg (ref 26.0–34.0)
MCHC: 30.7 g/dL (ref 30.0–36.0)
MCV: 107.8 fL — ABNORMAL HIGH (ref 80.0–100.0)
MONO ABS: 0.7 10*3/uL (ref 0.1–1.0)
Monocytes Relative: 10 %
Neutro Abs: 2.8 10*3/uL (ref 1.7–7.7)
Neutrophils Relative %: 44 %
Platelets: 169 10*3/uL (ref 150–400)
RBC: 3.47 MIL/uL — ABNORMAL LOW (ref 4.22–5.81)
RDW: 13.3 % (ref 11.5–15.5)
WBC: 6.4 10*3/uL (ref 4.0–10.5)
nRBC: 0 % (ref 0.0–0.2)

## 2018-07-28 LAB — URINALYSIS, ROUTINE W REFLEX MICROSCOPIC
Bilirubin Urine: NEGATIVE
Glucose, UA: NEGATIVE mg/dL
Ketones, ur: NEGATIVE mg/dL
Nitrite: POSITIVE — AB
Protein, ur: 30 mg/dL — AB
RBC / HPF: >50 RBC/hpf — ABNORMAL HIGH (ref 0–5)
Specific Gravity, Urine: 1.009 (ref 1.005–1.030)
pH: 8 (ref 5.0–8.0)

## 2018-07-28 LAB — BASIC METABOLIC PANEL
Anion gap: 14 (ref 5–15)
BUN: 74 mg/dL — ABNORMAL HIGH (ref 8–23)
CO2: 18 mmol/L — AB (ref 22–32)
Calcium: 9.7 mg/dL (ref 8.9–10.3)
Chloride: 107 mmol/L (ref 98–111)
Creatinine, Ser: 3.85 mg/dL — ABNORMAL HIGH (ref 0.61–1.24)
GFR calc Af Amer: 16 mL/min — ABNORMAL LOW (ref >60)
GFR calc non Af Amer: 14 mL/min — ABNORMAL LOW (ref >60)
Glucose, Bld: 142 mg/dL — ABNORMAL HIGH (ref 70–99)
Potassium: 3.6 mmol/L (ref 3.5–5.1)
Sodium: 139 mmol/L (ref 135–145)

## 2018-07-28 MED ORDER — ACETAMINOPHEN 500 MG PO TABS
1000.0000 mg | ORAL_TABLET | Freq: Once | ORAL | Status: AC
  Administered 2018-07-28: 1000 mg via ORAL
  Filled 2018-07-28: qty 2

## 2018-07-28 MED ORDER — CEFTRIAXONE SODIUM 1 G IJ SOLR
1.0000 g | Freq: Once | INTRAMUSCULAR | Status: AC
  Administered 2018-07-28: 1 g via INTRAMUSCULAR
  Filled 2018-07-28: qty 10

## 2018-07-28 MED ORDER — TAMSULOSIN HCL 0.4 MG PO CAPS
0.4000 mg | ORAL_CAPSULE | Freq: Every day | ORAL | Status: DC
  Administered 2018-07-28: 0.4 mg via ORAL
  Filled 2018-07-28: qty 1

## 2018-07-28 MED ORDER — CEPHALEXIN 500 MG PO CAPS: 500.0000 mg | ORAL_CAPSULE | Freq: Four times a day (QID) | ORAL | 0 refills | Status: DC

## 2018-07-28 MED ORDER — TAMSULOSIN HCL 0.4 MG PO CAPS: 0.4000 mg | ORAL_CAPSULE | Freq: Every day | ORAL | 0 refills | Status: DC

## 2018-07-28 NOTE — ED Provider Notes (Signed)
[facility] DEPT Provider Note   CSN: [id number] Arrival date & time: 07/28/18  0217    History   Chief Complaint No chief complaint on file.   HPI Richard Moses is a 85 y.o. male.     The history is provided by the patient.  Illness  Location:  Bladder Quality:  Pain and spasm Severity:  Severe Onset quality:  Gradual Timing:  Constant Progression:  Unchanged Chronicity:  Recurrent Context:  Indwelling foley Relieved by:  Nothing Worsened by:  Nothing Ineffective treatments:  None tried Associated symptoms: abdominal pain   Associated symptoms: no chest pain, no fever, no nausea, no shortness of breath, no vomiting and no wheezing   Risk factors:  Enlarged prostate   Past Medical History:  Diagnosis Date  . Abnormal MRI, cervical spine 09/29/10   Mildly abnormal MRI cervical spine demonstrating mild spondylosis and disc bulging from C4-5 down to C7-T1.  No spinal stenosis or foraminal narrowing  . Anemia   . Arthritis   . Blood transfusion without reported diagnosis   . Cancer [facility])    possible melanoma on lt.leg  . Cataract    beginning stage  . Cervical disc herniation 09/29/10   C4-C5 and C7-T1  . Chronic kidney disease (CKD)   . Chronic left-sided headaches   . Colon polyp 2007  . Diabetes mellitus   . External hemorrhoids without mention of complication 5852  . GERD (gastroesophageal reflux disease)   . Hyperlipidemia   . Hypertension   . Hypothyroidism 09/29/10   Untreated. Patient not interested in Rx.   . Intermittent self-catheterization of bladder    placed every month  . Knee pain, bilateral 02/05/2013   Pain since a fall in 2014  . MRI of brain abnormal 09/29/10   Abnormal MRI of brain, demonstrating mild atrophy  . Neck pain on left side 2012    Patient Active Problem List   Diagnosis Date Noted  . Secondary hyperparathyroidism ([place]) 03/03/2018  . Memory disturbance 03/03/2017  . Bilateral knee pain  03/03/2017  . Bilateral cataracts 01/17/2017  . Peripheral neuropathy 11/30/2016  . Anemia associated with stage 5 chronic renal failure ([place]) 07/14/2016  . Benign non-nodular prostatic hyperplasia with lower urinary tract symptoms 07/11/2014  . Bilateral hydronephrosis 04/26/2014  . Incomplete bladder emptying 04/26/2014  . Urinary retention 04/22/2014  . Chronic kidney disease, stage V ([place]) 04/17/2014  . Dysphagia, pharyngoesophageal phase 09/11/2012  . Primary hypertension 12/25/2009  . Elevated PSA 05/20/2009  . Diet-controlled diabetes mellitus ([place]) 02/18/2009    Past Surgical History:  Procedure Laterality Date  . COLONOSCOPY    . MELANOMA EXCISION     left side of neck  . POLYPECTOMY    . RHINOPLASTY     x2         Home Medications    Prior to Admission medications   Medication Sig Start Date End Date Taking? Authorizing Provider  Barberry-Oreg Grape-Goldenseal (BERBERINE COMPLEX PO) Take 500 mg by mouth 3 (three) times daily with meals. Reported on 12/11/2015    [provider]  ferrous sulfate 324 (65 Fe) MG TBEC Take by mouth.    [provider]  Multiple Vitamins-Minerals (MENS MULTIVITAMIN PLUS) TABS Take 1 tablet by mouth daily after breakfast.    [provider]    Family History Family History  Problem Relation Age of Onset  . Cancer Father 64       Renal CA  . Cancer Sister  Brain   . Cancer Brother        Brain  . Colon cancer Neg Hx   . Stomach cancer Neg Hx   . Colon polyps Neg Hx   . Esophageal cancer Neg Hx   . Rectal cancer Neg Hx     Social History Social History   Tobacco Use  . Smoking status: Former Smoker    Types: Cigarettes    Last attempt to quit: 12/06/1966    Years since quitting: 51.6  . Smokeless tobacco: Never Used  Substance Use Topics  . Alcohol use: Yes    Alcohol/week: 0.0 standard drinks    Comment: occasionally - had cut back since diabetes  . Drug use: No     Allergies     Patient has no known allergies.   Review of Systems Review of Systems  Constitutional: Negative for fever.  Respiratory: Negative for shortness of breath and wheezing.   Cardiovascular: Negative for chest pain.  Gastrointestinal: Positive for abdominal pain. Negative for constipation, nausea and vomiting.  Genitourinary: Positive for difficulty urinating.  All other systems reviewed and are negative.    Physical Exam Updated Vital Signs BP (!) 144/84   Pulse 97   Temp 97.9 F (36.6 C) (Oral)   Resp 18   SpO2 99%   Physical Exam Vitals signs and nursing note reviewed.  Constitutional:      Appearance: Normal appearance. He is normal weight. He is not toxic-appearing.  HENT:     Head: Normocephalic and atraumatic.     Nose: Nose normal.     Mouth/Throat:     Mouth: Mucous membranes are moist.     Pharynx: Oropharynx is clear.  Eyes:     Conjunctiva/sclera: Conjunctivae normal.     Pupils: Pupils are equal, round, and reactive to light.  Neck:     Musculoskeletal: Normal range of motion and neck supple.  Cardiovascular:     Rate and Rhythm: Normal rate and regular rhythm.     Pulses: Normal pulses.     Heart sounds: Normal heart sounds.  Pulmonary:     Effort: Pulmonary effort is normal.     Breath sounds: Normal breath sounds.  Abdominal:     General: Abdomen is flat. Bowel sounds are normal.     Tenderness: There is no abdominal tenderness. There is no guarding or rebound.  Musculoskeletal: Normal range of motion.  Skin:    General: Skin is warm and dry.     Capillary Refill: Capillary refill takes less than 2 seconds.  Neurological:     General: No focal deficit present.     Mental Status: He is alert and oriented to person, place, and time.  Psychiatric:        Mood and Affect: Mood normal.        Behavior: Behavior normal.      ED Treatments / Results  Labs (all labs ordered are listed, but only abnormal results are displayed) Results for orders  placed or performed during the hospital encounter of 07/28/18  CBC with Differential/Platelet  Result Value Ref Range   WBC 6.4 4.0 - 10.5 K/uL   RBC 3.47 (L) 4.22 - 5.81 MIL/uL   Hemoglobin 11.5 (L) 13.0 - 17.0 g/dL   HCT 37.4 (L) 39.0 - 52.0 %   MCV 107.8 (H) 80.0 - 100.0 fL   MCH 33.1 26.0 - 34.0 pg   MCHC 30.7 30.0 - 36.0 g/dL   RDW 13.3 11.5 - 15.5 %  Platelets 169 150 - 400 K/uL   nRBC 0.0 0.0 - 0.2 %   Neutrophils Relative % 44 %   Neutro Abs 2.8 1.7 - 7.7 K/uL   Lymphocytes Relative 40 %   Lymphs Abs 2.6 0.7 - 4.0 K/uL   Monocytes Relative 10 %   Monocytes Absolute 0.7 0.1 - 1.0 K/uL   Eosinophils Relative 5 %   Eosinophils Absolute 0.3 0.0 - 0.5 K/uL   Basophils Relative 1 %   Basophils Absolute 0.0 0.0 - 0.1 K/uL   Immature Granulocytes 0 %   Abs Immature Granulocytes 0.01 0.00 - 0.07 K/uL  Basic metabolic panel  Result Value Ref Range   Sodium 139 135 - 145 mmol/L   Potassium 3.6 3.5 - 5.1 mmol/L   Chloride 107 98 - 111 mmol/L   CO2 18 (L) 22 - 32 mmol/L   Glucose, Bld 142 (H) 70 - 99 mg/dL   BUN 74 (H) 8 - 23 mg/dL   Creatinine, Ser 3.85 (H) 0.61 - 1.24 mg/dL   Calcium 9.7 8.9 - 10.3 mg/dL   GFR calc non Af Amer 14 (L) >60 mL/min   GFR calc Af Amer 16 (L) >60 mL/min   Anion gap 14 5 - 15  Urinalysis, Routine w reflex microscopic  Result Value Ref Range   Color, Urine YELLOW YELLOW   APPearance CLOUDY (A) CLEAR   Specific Gravity, Urine 1.009 1.005 - 1.030   pH 8.0 5.0 - 8.0   Glucose, UA NEGATIVE NEGATIVE mg/dL   Hgb urine dipstick LARGE (A) NEGATIVE   Bilirubin Urine NEGATIVE NEGATIVE   Ketones, ur NEGATIVE NEGATIVE mg/dL   Protein, ur 30 (A) NEGATIVE mg/dL   Nitrite POSITIVE (A) NEGATIVE   Leukocytes,Ua LARGE (A) NEGATIVE   RBC / HPF >50 (H) 0 - 5 RBC/hpf   WBC, UA 11-20 0 - 5 WBC/hpf   Bacteria, UA MANY (A) NONE SEEN   WBC Clumps PRESENT    Hyaline Casts, UA PRESENT    Amorphous Crystal PRESENT    No results  found.  EKG None  Radiology No results found.  Procedures Procedures (including critical care time)  Medications Ordered in ED Medications  tamsulosin (FLOMAX) capsule 0.4 mg (0.4 mg Oral Given 07/28/18 0331)  cefTRIAXone (ROCEPHIN) injection 1 g (1 g Intramuscular Given 07/28/18 0331)  acetaminophen (TYLENOL) tablet 1,000 mg (1,000 mg Oral Given 07/28/18 0331)      Final Clinical Impressions(s) / ED Diagnoses   Final diagnoses:  Urinary retention  Urinary tract infection associated with indwelling urethral catheter, initial encounter ([place])  Renal insufficiency   Will start oral antibiotics.  Call urology in the am to be seen.    Return for pain, intractable cough, fevers >100.4 unrelieved by medication, shortness of breath, intractable vomiting, chest pain, shortness of breath, weakness numbness, changes in speech, facial asymmetry,abdominal pain, passing out,Inability to tolerate liquids or food, cough, altered mental status or any concerns. No signs of systemic illness or infection. The patient is nontoxic-appearing on exam and vital signs are within normal limits.   I have reviewed the triage vital signs and the nursing notes. Pertinent labs &imaging results that were available during my care of the patient were reviewed by me and considered in my medical decision making (see chart for details).  After history, exam, and medical workup I feel the patient has been appropriately medically screened and is safe for discharge home. Pertinent diagnoses were discussed with the patient. Patient was given return precautions.  Sorren Vallier, MD 07/28/18 [phone]

## 2018-07-28 NOTE — ED Triage Notes (Signed)
Pt complains of his catheter being clogged, he states that its due to be changed on March 3, pt states hes in severe pain  Catheter was changed with a 16 Fr with 500 cc return, there was a blood clot when initially changed  Pt states relief with catheter change

## 2018-07-31 ENCOUNTER — Other Ambulatory Visit: Payer: Self-pay | Admitting: Family Medicine

## 2018-07-31 LAB — URINE CULTURE
Culture: >=100000 — AB
SPECIAL REQUESTS: NORMAL

## 2018-08-01 ENCOUNTER — Telehealth: Payer: Self-pay

## 2018-08-01 NOTE — Telephone Encounter (Signed)
Post ED Visit - Positive Culture Follow-up: Unsuccessful Patient Follow-up  Culture assessed and recommendations reviewed by:  []  Elenor Quinones, Pharm.D. []  Heide Guile, Pharm.D., BCPS AQ-ID []  Parks Neptune, Pharm.D., BCPS []  Alycia Rossetti, Pharm.D., BCPS []  [place], [place].D., BCPS, AAHIVP []  Legrand Como, Pharm.D., BCPS, AAHIVP []  [name], PharmD []  Vincenza Hews, PharmD, BCPS  Babette Relic Pharm D at [facility] Positive urine culture For Symptom check per [facility] at [facility] ED.  Nees to stop Cephalexin and F/u with Uro []  Patient discharged without antimicrobial prescription and treatment is now indicated []  Organism is resistant to prescribed ED discharge antimicrobial []  Patient with positive blood cultures   Unable to contact patient after 3 attempts, letter will be sent to address on file  Genia Del 08/01/2018, 3:59 PM

## 2018-08-03 ENCOUNTER — Ambulatory Visit (HOSPITAL_COMMUNITY)
Admission: RE | Admit: 2018-08-03 | Discharge: 2018-08-03 | Disposition: A | Discharge status: Home / Self Care | Payer: Medicare HMO | Source: Ambulatory Visit | Attending: Nephrology | Admitting: Nephrology

## 2018-08-03 VITALS — BP 117/64 | HR 93 | Temp 98.2°F | Resp 20

## 2018-08-03 DIAGNOSIS — N185 Chronic kidney disease, stage 5: Secondary | ICD-10-CM | POA: Insufficient documentation

## 2018-08-03 LAB — POCT HEMOGLOBIN-HEMACUE: Hemoglobin: 11.3 g/dL — ABNORMAL LOW (ref 13.0–17.0)

## 2018-08-03 MED ORDER — EPOETIN ALFA 10000 UNIT/ML IJ SOLN
INTRAMUSCULAR | Status: AC
  Administered 2018-08-03: 10000 [IU] via SUBCUTANEOUS
  Filled 2018-08-03: qty 1

## 2018-08-03 MED ORDER — EPOETIN ALFA 10000 UNIT/ML IJ SOLN
10000.0000 [IU] | INTRAMUSCULAR | Status: DC
  Administered 2018-08-03: 10000 [IU] via SUBCUTANEOUS

## 2018-08-04 ENCOUNTER — Telehealth: Payer: Self-pay | Admitting: *Deleted

## 2018-08-04 NOTE — Telephone Encounter (Signed)
Contacted by patient in response to phone message.  States feeling better and has F/U with Urology on March 26.

## 2018-08-14 ENCOUNTER — Encounter (HOSPITAL_COMMUNITY)
Admission: RE | Admit: 2018-08-14 | Discharge: 2018-08-14 | Disposition: A | Discharge status: Home / Self Care | Payer: Medicare HMO | Source: Ambulatory Visit | Attending: Nephrology | Admitting: Nephrology

## 2018-08-14 VITALS — BP 136/77 | HR 89 | Temp 98.3°F | Resp 20

## 2018-08-14 DIAGNOSIS — D631 Anemia in chronic kidney disease: Secondary | ICD-10-CM | POA: Diagnosis not present

## 2018-08-14 DIAGNOSIS — N184 Chronic kidney disease, stage 4 (severe): Secondary | ICD-10-CM | POA: Diagnosis not present

## 2018-08-14 DIAGNOSIS — N185 Chronic kidney disease, stage 5: Secondary | ICD-10-CM

## 2018-08-14 LAB — IRON AND TIBC
Iron: 80 ug/dL (ref 45–182)
Saturation Ratios: 28 % (ref 17.9–39.5)
TIBC: 286 ug/dL (ref 250–450)
UIBC: 206 ug/dL

## 2018-08-14 LAB — RENAL FUNCTION PANEL
ALBUMIN: 3.9 g/dL (ref 3.5–5.0)
Anion gap: 12 (ref 5–15)
BUN: 56 mg/dL — AB (ref 8–23)
CO2: 22 mmol/L (ref 22–32)
Calcium: 10.2 mg/dL (ref 8.9–10.3)
Chloride: 105 mmol/L (ref 98–111)
Creatinine, Ser: 3.99 mg/dL — ABNORMAL HIGH (ref 0.61–1.24)
GFR calc Af Amer: 15 mL/min — ABNORMAL LOW (ref >60)
GFR calc non Af Amer: 13 mL/min — ABNORMAL LOW (ref >60)
Glucose, Bld: 112 mg/dL — ABNORMAL HIGH (ref 70–99)
PHOSPHORUS: 4.5 mg/dL (ref 2.5–4.6)
Potassium: 3.7 mmol/L (ref 3.5–5.1)
Sodium: 139 mmol/L (ref 135–145)

## 2018-08-14 LAB — POCT HEMOGLOBIN-HEMACUE: Hemoglobin: 10.9 g/dL — ABNORMAL LOW (ref 13.0–17.0)

## 2018-08-14 LAB — MAGNESIUM: Magnesium: 2.7 mg/dL — ABNORMAL HIGH (ref 1.7–2.4)

## 2018-08-14 LAB — FERRITIN: Ferritin: 288 ng/mL (ref 24–336)

## 2018-08-14 MED ORDER — EPOETIN ALFA 10000 UNIT/ML IJ SOLN
INTRAMUSCULAR | Status: AC
  Filled 2018-08-14: qty 1

## 2018-08-14 MED ORDER — EPOETIN ALFA 10000 UNIT/ML IJ SOLN
10000.0000 [IU] | INTRAMUSCULAR | Status: DC
  Administered 2018-08-14: 10000 [IU] via SUBCUTANEOUS

## 2018-08-17 ENCOUNTER — Encounter (HOSPITAL_COMMUNITY): Payer: Medicare HMO

## 2018-08-28 ENCOUNTER — Encounter (HOSPITAL_COMMUNITY): Payer: Medicare HMO

## 2018-08-31 ENCOUNTER — Ambulatory Visit (HOSPITAL_COMMUNITY)
Admission: RE | Admit: 2018-08-31 | Discharge: 2018-08-31 | Disposition: A | Discharge status: Home / Self Care | Payer: Medicare HMO | Source: Ambulatory Visit | Attending: Nephrology | Admitting: Nephrology

## 2018-08-31 ENCOUNTER — Other Ambulatory Visit: Payer: Self-pay

## 2018-08-31 ENCOUNTER — Encounter (HOSPITAL_COMMUNITY): Payer: Medicare HMO

## 2018-08-31 VITALS — BP 135/79 | HR 95 | Temp 97.9°F | Resp 18

## 2018-08-31 DIAGNOSIS — N401 Enlarged prostate with lower urinary tract symptoms: Secondary | ICD-10-CM | POA: Diagnosis not present

## 2018-08-31 DIAGNOSIS — N185 Chronic kidney disease, stage 5: Secondary | ICD-10-CM | POA: Diagnosis not present

## 2018-08-31 DIAGNOSIS — R3914 Feeling of incomplete bladder emptying: Secondary | ICD-10-CM | POA: Diagnosis not present

## 2018-08-31 LAB — POCT HEMOGLOBIN-HEMACUE: Hemoglobin: 12.3 g/dL — ABNORMAL LOW (ref 13.0–17.0)

## 2018-08-31 MED ORDER — EPOETIN ALFA 10000 UNIT/ML IJ SOLN: 10000.0000 [IU] | INTRAMUSCULAR | Status: DC

## 2018-08-31 NOTE — Progress Notes (Signed)
Pt here today in medical daycare for injection and lab draw.Pt stated he did not have a fever, cough or shortness of breath and had not been around anyone with those symptoms that he was aware of.    Pt traveled from new [place] and stated he got home yesterday.  After patient left  I called and spoke with Dr Hulen Skains at the command center.  We were instructed to let the patient know that even though he was displaying no symptoms at this time to self quarantine for 7 days.  I told him to watch for cough, fever, shortness of breath, body aches, hemoptysis, and if he had any of those symptoms to call us at medical daycare and make [place] aware and we will give him further instructions.  Pt verbalized understanding and stated he would stay home for 7 days.

## 2018-09-08 ENCOUNTER — Ambulatory Visit: Payer: Medicare HMO | Admitting: Family Medicine

## 2018-09-14 ENCOUNTER — Ambulatory Visit (HOSPITAL_COMMUNITY)
Admission: RE | Admit: 2018-09-14 | Discharge: 2018-09-14 | Disposition: A | Discharge status: Home / Self Care | Payer: Medicare HMO | Source: Ambulatory Visit | Attending: Nephrology | Admitting: Nephrology

## 2018-09-14 ENCOUNTER — Other Ambulatory Visit: Payer: Self-pay

## 2018-09-14 VITALS — BP 147/72 | HR 99 | Temp 96.9°F | Resp 18

## 2018-09-14 DIAGNOSIS — N185 Chronic kidney disease, stage 5: Secondary | ICD-10-CM | POA: Diagnosis present

## 2018-09-14 LAB — RENAL FUNCTION PANEL
Albumin: 3.8 g/dL (ref 3.5–5.0)
Anion gap: 11 (ref 5–15)
BUN: 64 mg/dL — ABNORMAL HIGH (ref 8–23)
CO2: 23 mmol/L (ref 22–32)
Calcium: 10.3 mg/dL (ref 8.9–10.3)
Chloride: 106 mmol/L (ref 98–111)
Creatinine, Ser: 4 mg/dL — ABNORMAL HIGH (ref 0.61–1.24)
GFR calc Af Amer: 15 mL/min — ABNORMAL LOW (ref >60)
GFR calc non Af Amer: 13 mL/min — ABNORMAL LOW (ref >60)
Glucose, Bld: 132 mg/dL — ABNORMAL HIGH (ref 70–99)
Phosphorus: 4 mg/dL (ref 2.5–4.6)
Potassium: 3.6 mmol/L (ref 3.5–5.1)
Sodium: 140 mmol/L (ref 135–145)

## 2018-09-14 LAB — IRON AND TIBC
Iron: 124 ug/dL (ref 45–182)
Saturation Ratios: 47 % — ABNORMAL HIGH (ref 17.9–39.5)
TIBC: 263 ug/dL (ref 250–450)
UIBC: 139 ug/dL

## 2018-09-14 LAB — FERRITIN: Ferritin: 433 ng/mL — ABNORMAL HIGH (ref 24–336)

## 2018-09-14 LAB — MAGNESIUM: Magnesium: 2.5 mg/dL — ABNORMAL HIGH (ref 1.7–2.4)

## 2018-09-14 LAB — POCT HEMOGLOBIN-HEMACUE: Hemoglobin: 11.5 g/dL — ABNORMAL LOW (ref 13.0–17.0)

## 2018-09-14 MED ORDER — EPOETIN ALFA 10000 UNIT/ML IJ SOLN
INTRAMUSCULAR | Status: AC
  Administered 2018-09-14: 11:00:00 10000 [IU] via SUBCUTANEOUS
  Filled 2018-09-14: qty 1

## 2018-09-14 MED ORDER — EPOETIN ALFA 10000 UNIT/ML IJ SOLN
10000.0000 [IU] | INTRAMUSCULAR | Status: DC
  Administered 2018-09-14: 11:00:00 10000 [IU] via SUBCUTANEOUS

## 2018-09-18 ENCOUNTER — Ambulatory Visit: Payer: Medicare HMO | Admitting: Family Medicine

## 2018-09-18 ENCOUNTER — Other Ambulatory Visit: Payer: Self-pay

## 2018-09-18 ENCOUNTER — Telehealth (INDEPENDENT_AMBULATORY_CARE_PROVIDER_SITE_OTHER): Payer: Medicare HMO | Admitting: Family Medicine

## 2018-09-18 ENCOUNTER — Encounter: Payer: Self-pay | Admitting: Family Medicine

## 2018-09-18 DIAGNOSIS — N185 Chronic kidney disease, stage 5: Secondary | ICD-10-CM

## 2018-09-18 DIAGNOSIS — G8929 Other chronic pain: Secondary | ICD-10-CM | POA: Diagnosis not present

## 2018-09-18 DIAGNOSIS — M25561 Pain in right knee: Secondary | ICD-10-CM

## 2018-09-18 DIAGNOSIS — M7542 Impingement syndrome of left shoulder: Secondary | ICD-10-CM

## 2018-09-18 DIAGNOSIS — M25562 Pain in left knee: Secondary | ICD-10-CM

## 2018-09-18 DIAGNOSIS — M25512 Pain in left shoulder: Secondary | ICD-10-CM

## 2018-09-18 MED ORDER — FUROSEMIDE 20 MG PO TABS: 20.0000 mg | ORAL_TABLET | Freq: Every day | ORAL | 3 refills | Status: DC

## 2018-09-18 MED ORDER — ACETAMINOPHEN ER 650 MG PO TBCR: 650.0000 mg | EXTENDED_RELEASE_TABLET | Freq: Three times a day (TID) | ORAL | 0 refills | Status: DC | PRN

## 2018-09-18 NOTE — Progress Notes (Signed)
[facility] Telemedicine Visit  Patient consented to have visit conducted via telephone.  Encounter participants: Patient: Richard Moses  Provider: Martyn Malay   Chief Complaint: left shoulder pain   HPI:  Richard Moses is a 85 year old gentlemen with history significant for chronic kidney disease presenting with left shoulder pain and bilateral knee pain via phone visit.  The patient reports a 1 to 78-month history of intermittent left shoulder pain.  He is right-hand dominant.  The pain is located on the front or anterior aspect of the shoulder.  He reports the pain is triggered by rolling over her shoulder at night and relieved reaching forward.  He denies pain upon exertion or chest pain.  He has taken nothing for his pain.  He has been doing pendulum style exercises for the past several weeks which seem to help.  He is afraid his medications due to his chronic kidney disease. No neck pain, no radiation of pain.   The patient also reports a many year history of bilateral knee pain.  He reports he fell on his bilateral knees on some stones in 2014.  Since that time he has had bilateral knee pain.  He denies any redness, swelling, fevers or recent fall.  He has not fallen in several years.  He is very scared of falls and feels his muscle strength is decreasing over time.  He is interested in physical therapy to help regain his strength around his knees. Denies locking, catching, or popping.     ROS: as above   Pertinent PMHx: Chronic kidney disease stage V and elevated serum glucose, consistent with prediabetes by labs  Exam:  Respiratory: Speaking clearly in full sentences.  No labored breathing or dyspnea appreciated  Assessment/Plan:  Diagnoses and all orders for this visit:  Chronic pain of both knees suspect knee OA  -     Ambulatory referral to Physical Therapy  Chronic kidney disease (CKD), stage V ([place]), noted on medication list only  -      furosemide (LASIX) 20 MG tablet; Take 1 tablet (20 mg total) by mouth daily.  Chronic left shoulder pain, likely due to impingement syndrome.  DDX also includes cervical radiculopathy. Given symptoms.  This is difficult to assess without a physical exam.  I discussed the limitations.  No red flags or warning symptoms.  Reviewed return precautions.  Recommended Tylenol 3 times a day.  Recommended ice at night.  He will continue stretches.  [facility] sometime this summer as we are able after the current pandemic -     acetaminophen (TYLENOL 8 HOUR) 650 MG CR tablet; Take 1 tablet (650 mg total) by mouth every 8 (eight) hours as needed for pain.  Time spent on phone with patient: 15 minutes

## 2018-09-26 DIAGNOSIS — R350 Frequency of micturition: Secondary | ICD-10-CM | POA: Diagnosis not present

## 2018-09-26 DIAGNOSIS — R3914 Feeling of incomplete bladder emptying: Secondary | ICD-10-CM | POA: Diagnosis not present

## 2018-09-28 ENCOUNTER — Ambulatory Visit (HOSPITAL_COMMUNITY)
Admission: RE | Admit: 2018-09-28 | Discharge: 2018-09-28 | Disposition: A | Discharge status: Home / Self Care | Payer: Medicare HMO | Source: Ambulatory Visit | Attending: Nephrology | Admitting: Nephrology

## 2018-09-28 ENCOUNTER — Other Ambulatory Visit: Payer: Self-pay

## 2018-09-28 VITALS — BP 126/81 | HR 108 | Temp 97.2°F

## 2018-09-28 DIAGNOSIS — N185 Chronic kidney disease, stage 5: Secondary | ICD-10-CM | POA: Insufficient documentation

## 2018-09-28 LAB — POCT HEMOGLOBIN-HEMACUE: Hemoglobin: 10.8 g/dL — ABNORMAL LOW (ref 13.0–17.0)

## 2018-09-28 MED ORDER — EPOETIN ALFA 10000 UNIT/ML IJ SOLN
10000.0000 [IU] | INTRAMUSCULAR | Status: DC
  Administered 2018-09-28: 10000 [IU] via SUBCUTANEOUS

## 2018-09-28 MED ORDER — EPOETIN ALFA 10000 UNIT/ML IJ SOLN
INTRAMUSCULAR | Status: AC
  Filled 2018-09-28: qty 1

## 2018-10-10 ENCOUNTER — Other Ambulatory Visit: Payer: Self-pay

## 2018-10-10 ENCOUNTER — Other Ambulatory Visit: Payer: Self-pay | Admitting: Family Medicine

## 2018-10-10 ENCOUNTER — Ambulatory Visit (INDEPENDENT_AMBULATORY_CARE_PROVIDER_SITE_OTHER): Payer: Medicare HMO | Admitting: Sports Medicine

## 2018-10-10 VITALS — BP 157/77 | Ht 71.0 in | Wt 147.0 lb

## 2018-10-10 DIAGNOSIS — M25561 Pain in right knee: Secondary | ICD-10-CM | POA: Diagnosis not present

## 2018-10-10 DIAGNOSIS — M25562 Pain in left knee: Secondary | ICD-10-CM | POA: Diagnosis not present

## 2018-10-10 DIAGNOSIS — G8929 Other chronic pain: Secondary | ICD-10-CM

## 2018-10-10 DIAGNOSIS — Z9889 Other specified postprocedural states: Secondary | ICD-10-CM

## 2018-10-10 NOTE — Progress Notes (Signed)
HPI  CC: Bilateral knee pain  Richard Moses is an 85 year old male who presents for bilateral knee pain.  He states the knee pain is been present since 2014.  He states that he was in [place] and stepped wrong on the sidewalk, and landed on both knees.  He states the pain is been present since that time.  He states is worse when he goes upstairs.  He has a stair lift at home which helps him get to and from his house.  He states the pain does not bother her much throughout the day as he is walking.  He does not bother him when he stands a sit.  He states it only really bothers him when he does any bending of the knee such as going upstairs.  He takes Tylenol as needed for pain relief.  He also does a cream over his knees (likely Biofreeze or similar).  He reports multiple falls over the last few years.  His most recent fall was 1 month ago.  He states he feels like his knees give out from underneath him.  He denies any numbness and tingling in his leg.  Denies any swelling down his lower extremities.  He denies any recent fevers or chills.  He has no history of trauma to the knees outside of frequent falls.  Past Injuries: None Past Surgeries: None recorded Smoking: Former smoker, quit in 1968 Family Hx: Noncontributory  ROS: Per HPI; in addition no fever, no rash, no additional weakness, no additional numbness, no additional paresthesias, and no additional falls/injury.   Past medical history, medications, and allergies reviewed myself at today's visit.  Objective: BP (!) 157/77    Ht 5\' 11"  (1.803 m)    Wt 147 lb (66.7 kg)    BMI 20.50 kg/m  Gen: Right-Hand Dominant. NAD, well groomed, a/o x3, normal affect.  CV: Well-perfused. Warm.  Resp: Non-labored.  Neuro: Sensation intact throughout. No gross coordination deficits.  Gait: Nonpathologic posture, unremarkable stride without signs of limp or balance issues.  Left knee exam: No erythema or warmth noted.  Moderate amount of swelling noted on  patellar ballottement.  Tenderness palpation of the medial and lateral joint line.  Range of motion with a 5 degree lag of knee extension.  Full range of motion knee flexion.  Strength 5/5 throughout testing.  Negative ligamentous testing.  Negative Murray test.  Right knee exam: No erythema or warmth noted.  Mild amount of swelling noted on patellar ballottement.  Tenderness palpation of the medial and lateral joint line.  Range of motion with a 5 degree lag of knee extension.  Full range of motion knee flexion.  Strength 5/5 throughout testing.  Negative ligamentous testing.  Negative Murray test.  Assessment and Plan: Bilateral knee pain, likely secondary to underlying osteoarthritis.  We discussed treatment options at today's visit.  We will obtain x-rays (AP, lateral, sunrise, Lutricia Feil views).  I suspect there will be a high degree of osteoarthritis on these films.  We will provide him with compression sleeves of both knees at today's visit.  He can take Tylenol as needed for pain relief.  Also provided with some home exercises for quadricep strengthening.  We will see him back in 4 weeks for follow-up.  If he has no improvement that time, I will consider sending him to formal physical therapy, especially given history of recent falls.  [name], MD [facility] 10/10/2018 3:46 PM  Patient seen and evaluated with  the sports medicine fellow.  I agree with the above plan of care.  Patient will follow-up in 4 weeks.  We will discuss x-ray findings at that time.  He really has very little in the way of pain so I do not think cortisone injections are necessary at this time.  He definitely has some quad weakness which we need to improve.  If he struggles with his home exercise program then I may refer him to formal physical therapy.  He will call with questions or concerns prior to his follow-up visit.

## 2018-10-10 NOTE — Patient Instructions (Signed)
Thank you for coming to see [place] today in clinic.  We saw you for your knee pain at today's visit.  We believe this is secondary to arthritis.  We will obtain x-rays at this time to evaluate your joint space.  We also provided you with some quad strengthening exercises at today's visit.  You can do these in conjunction with your bike exercises.  We will provide with a compression sleeve as well at today's visit to help with the swelling.  We will see back in 1 month reevaluate.

## 2018-10-11 ENCOUNTER — Ambulatory Visit
Admission: RE | Admit: 2018-10-11 | Discharge: 2018-10-11 | Disposition: A | Discharge status: Home / Self Care | Payer: Medicare HMO | Source: Ambulatory Visit | Attending: Sports Medicine | Admitting: Sports Medicine

## 2018-10-11 ENCOUNTER — Encounter: Payer: Self-pay | Admitting: Sports Medicine

## 2018-10-11 DIAGNOSIS — M25561 Pain in right knee: Principal | ICD-10-CM

## 2018-10-11 DIAGNOSIS — M25562 Pain in left knee: Principal | ICD-10-CM

## 2018-10-11 DIAGNOSIS — G8929 Other chronic pain: Secondary | ICD-10-CM

## 2018-10-12 ENCOUNTER — Ambulatory Visit (HOSPITAL_COMMUNITY)
Admission: RE | Admit: 2018-10-12 | Discharge: 2018-10-12 | Disposition: A | Discharge status: Home / Self Care | Payer: Medicare HMO | Source: Ambulatory Visit | Attending: Nephrology | Admitting: Nephrology

## 2018-10-12 ENCOUNTER — Other Ambulatory Visit: Payer: Self-pay

## 2018-10-12 VITALS — BP 142/70 | HR 87 | Temp 96.9°F

## 2018-10-12 DIAGNOSIS — N185 Chronic kidney disease, stage 5: Secondary | ICD-10-CM | POA: Diagnosis not present

## 2018-10-12 LAB — MAGNESIUM: Magnesium: 2.7 mg/dL — ABNORMAL HIGH (ref 1.7–2.4)

## 2018-10-12 LAB — IRON AND TIBC
Iron: 90 ug/dL (ref 45–182)
Saturation Ratios: 31 % (ref 17.9–39.5)
TIBC: 291 ug/dL (ref 250–450)
UIBC: 201 ug/dL

## 2018-10-12 LAB — RENAL FUNCTION PANEL
Albumin: 3.8 g/dL (ref 3.5–5.0)
Anion gap: 13 (ref 5–15)
BUN: 76 mg/dL — ABNORMAL HIGH (ref 8–23)
CO2: 23 mmol/L (ref 22–32)
Calcium: 10.4 mg/dL — ABNORMAL HIGH (ref 8.9–10.3)
Chloride: 104 mmol/L (ref 98–111)
Creatinine, Ser: 4.46 mg/dL — ABNORMAL HIGH (ref 0.61–1.24)
GFR calc Af Amer: 13 mL/min — ABNORMAL LOW (ref >60)
GFR calc non Af Amer: 11 mL/min — ABNORMAL LOW (ref >60)
Glucose, Bld: 112 mg/dL — ABNORMAL HIGH (ref 70–99)
Phosphorus: 4.2 mg/dL (ref 2.5–4.6)
Potassium: 3.8 mmol/L (ref 3.5–5.1)
Sodium: 140 mmol/L (ref 135–145)

## 2018-10-12 LAB — POCT HEMOGLOBIN-HEMACUE: Hemoglobin: 10.8 g/dL — ABNORMAL LOW (ref 13.0–17.0)

## 2018-10-12 LAB — FERRITIN: Ferritin: 332 ng/mL (ref 24–336)

## 2018-10-12 MED ORDER — EPOETIN ALFA 10000 UNIT/ML IJ SOLN
INTRAMUSCULAR | Status: AC
  Filled 2018-10-12: qty 1

## 2018-10-12 MED ORDER — EPOETIN ALFA 10000 UNIT/ML IJ SOLN
10000.0000 [IU] | INTRAMUSCULAR | Status: DC
  Administered 2018-10-12: 10000 [IU] via SUBCUTANEOUS

## 2018-10-15 IMAGING — US US RENAL
1 series · 13 of 25 positions shown · non-contrast
Comparison: CT abdomen and pelvis 04/25/2014

CLINICAL DATA: Chronic kidney disease with recent hematuria and
proteinuria, frequent urination, hypertension, diabetes mellitus,
former smoker but

EXAM:
RENAL / URINARY TRACT ULTRASOUND COMPLETE

[Series 1: us renal · 0.23mm/px · 13 of 36 slices shown]
[im 1/36]
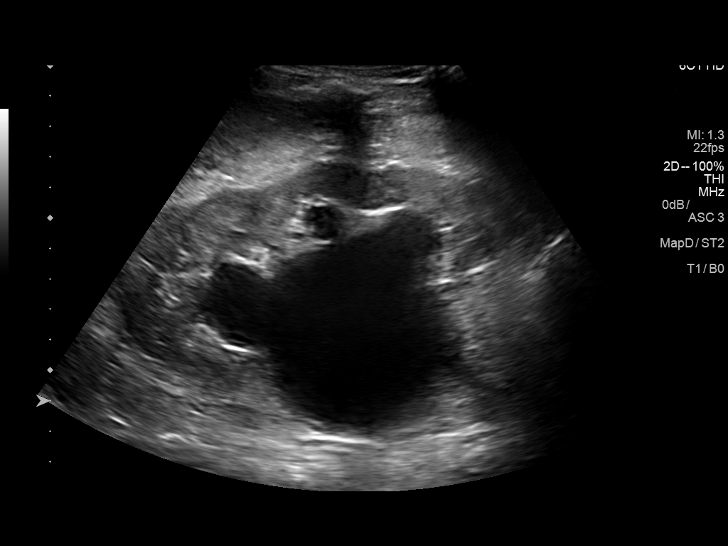
[im 3/36]
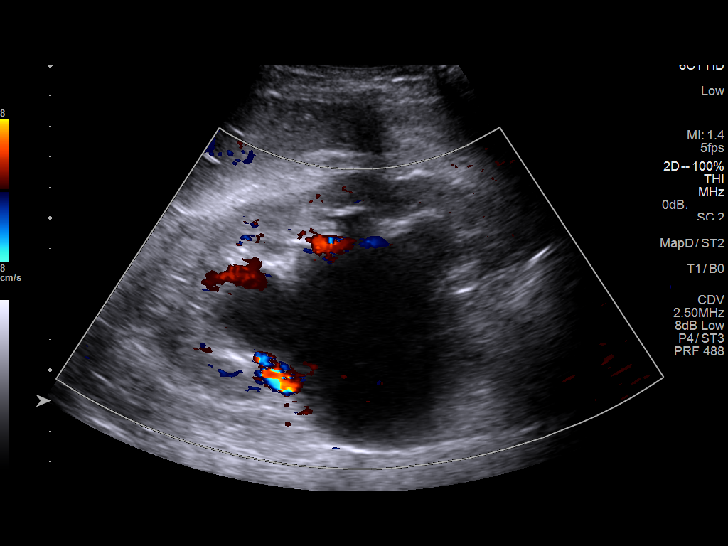
[im 6/36]
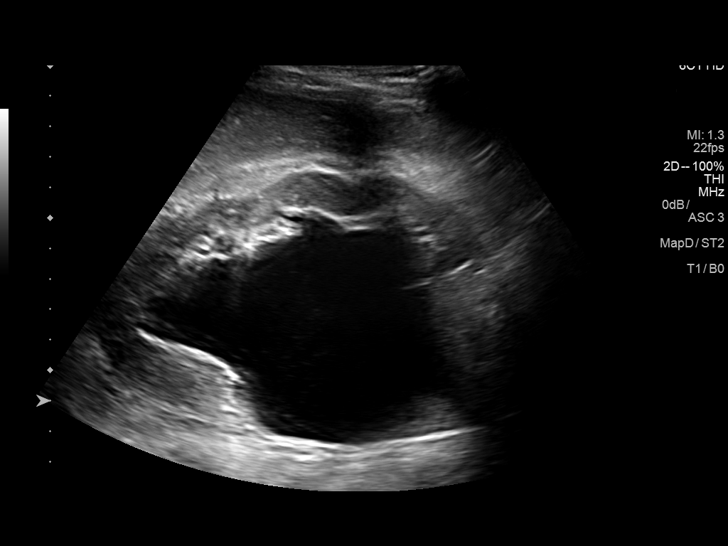
[im 9/36]
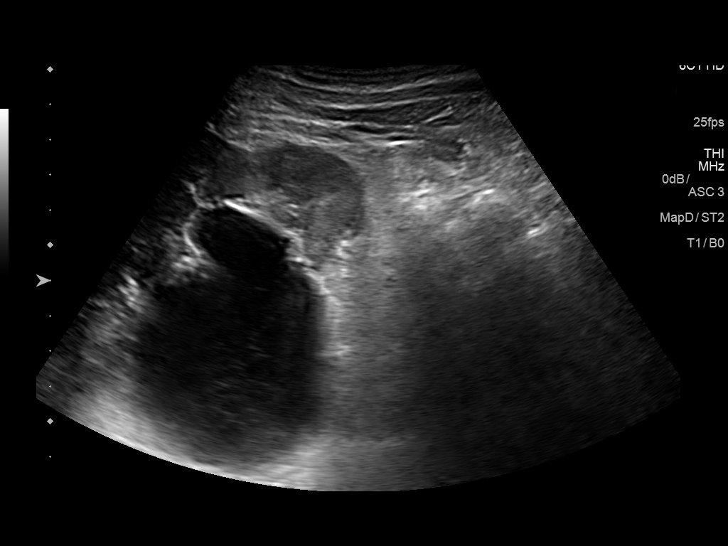
[im 12/36]
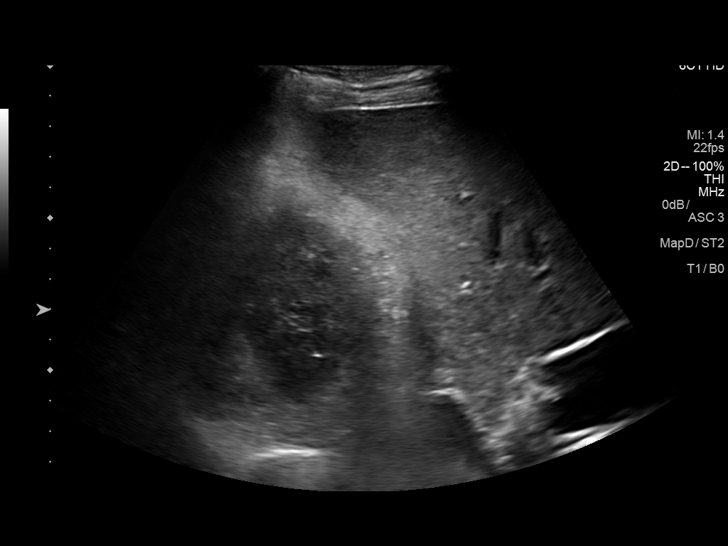
[im 15/36]
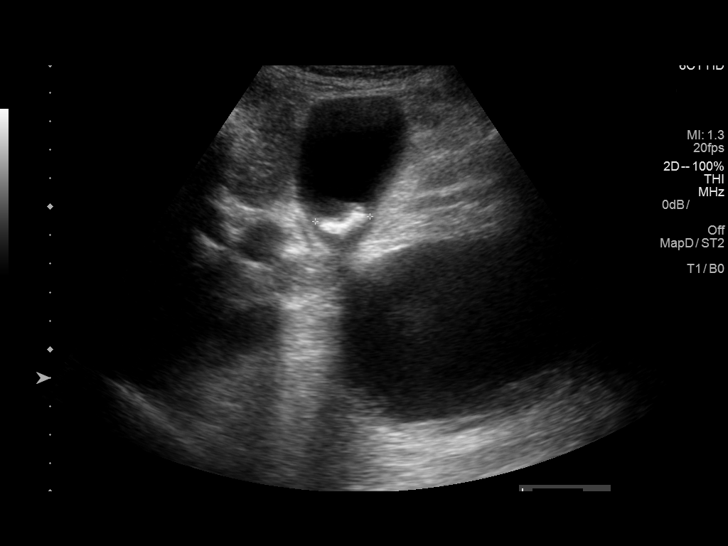
[im 18/36]
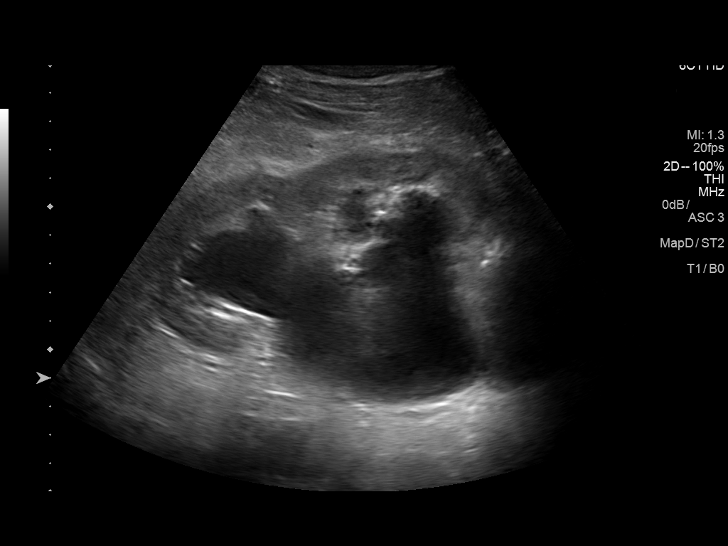
[im 21/36]
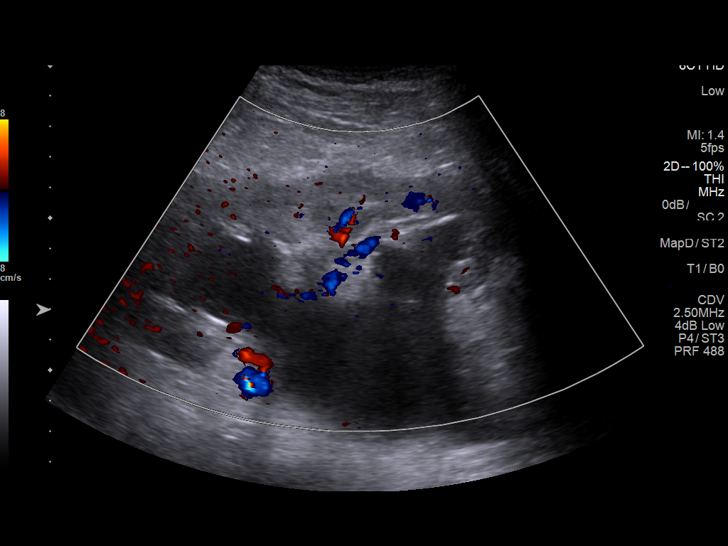
[im 24/36]
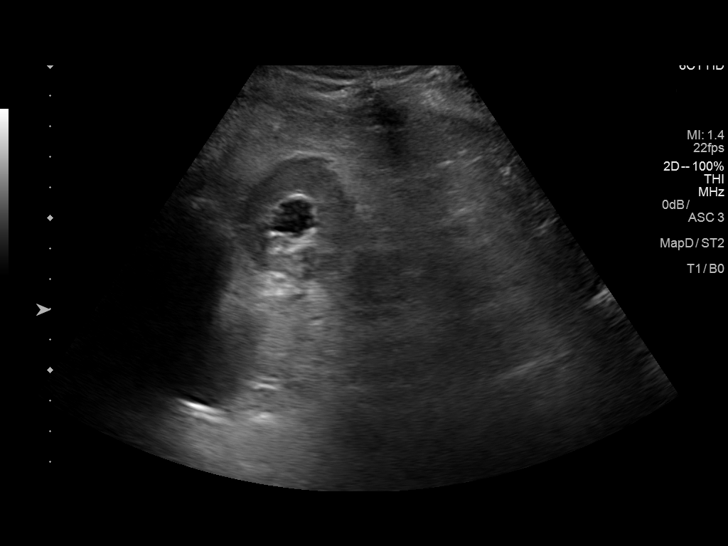
[im 27/36]
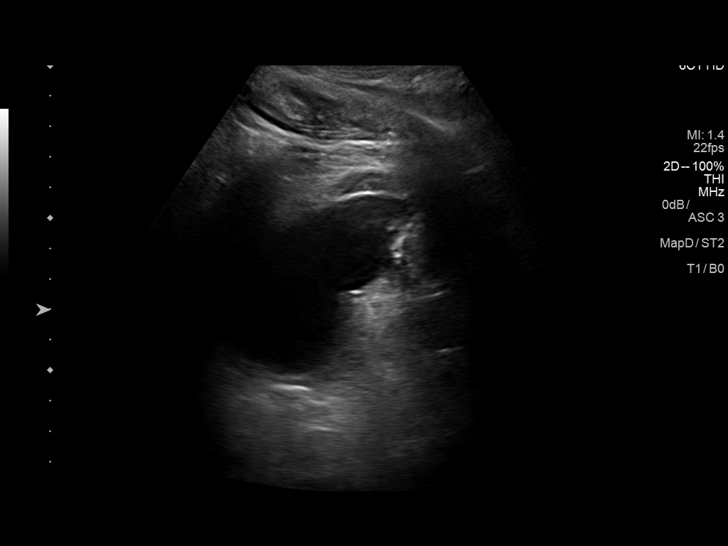
[im 30/36]
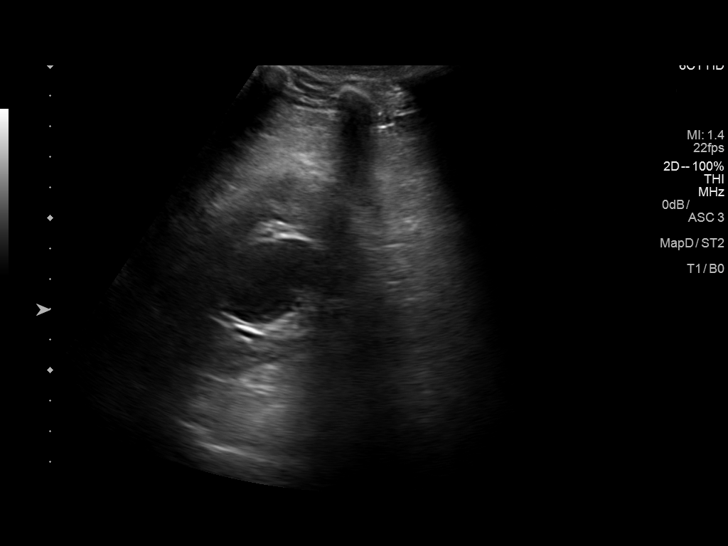
[im 33/36]
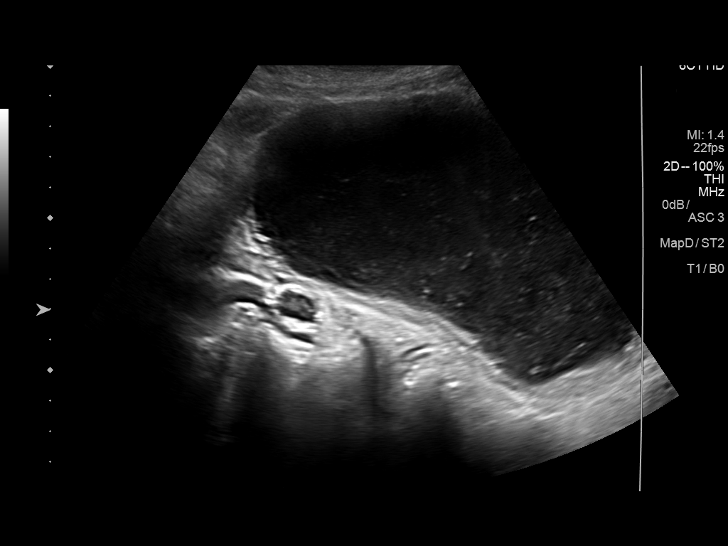
[im 36/36]
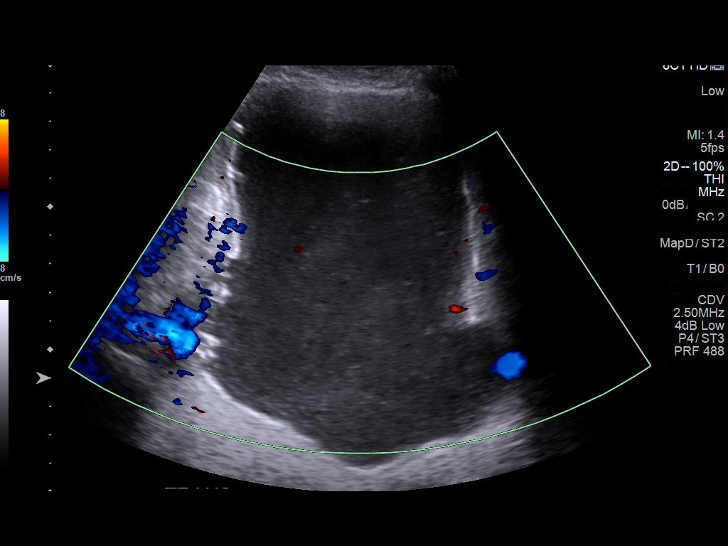

[13 of 25 positions shown; findings below may reference images not displayed]

FINDINGS: Right Kidney:

Length: 13.6. Cortical thinning with minimally increased cortical
echogenicity. Marked hydronephrosis, renal pelvis 6.6 cm diameter on
transverse image. No definite mass or shadowing calcification.

Left Kidney:

Length: 12.5 cm. Severe LEFT hydronephrosis, renal pelvis 5.5 cm
diameter on transverse image. No definite mass or shadowing
calcification.

Bladder:

Bladder appears distended and contains significant internal debris.
Sonographer indicates that patient voided within 30 minutes prior to
imaging. No definite bladder mass or wall thickening seen.
Ureters/ureteral dilatation could not be visualized sonographically.

Incidentally noted cholelithiasis
IMPRESSION: Markedly distended urinary bladder containing debris.

Severe BILATERAL hydronephrosis slightly greater on RIGHT, though
the ureters could not be definitely visualized ; prior CT exam from
3936 demonstrated significant prostatic enlargement, bladder wall
thickening likely trabeculation, and BILATERAL
hydroureteronephrosis.

Cholelithiasis.

These results will be called to the ordering clinician or
representative by the Radiologist Assistant, and communication
documented in the PACS or zVision Dashboard.

## 2018-10-16 DIAGNOSIS — M950 Acquired deformity of nose: Secondary | ICD-10-CM | POA: Insufficient documentation

## 2018-10-26 ENCOUNTER — Other Ambulatory Visit: Payer: Self-pay

## 2018-10-26 ENCOUNTER — Ambulatory Visit (HOSPITAL_COMMUNITY)
Admission: RE | Admit: 2018-10-26 | Discharge: 2018-10-26 | Disposition: A | Discharge status: Home / Self Care | Payer: Medicare HMO | Source: Ambulatory Visit | Attending: Nephrology | Admitting: Nephrology

## 2018-10-26 VITALS — BP 132/69 | HR 85 | Temp 98.2°F | Resp 18

## 2018-10-26 DIAGNOSIS — I120 Hypertensive chronic kidney disease with stage 5 chronic kidney disease or end stage renal disease: Secondary | ICD-10-CM | POA: Diagnosis not present

## 2018-10-26 DIAGNOSIS — D631 Anemia in chronic kidney disease: Secondary | ICD-10-CM | POA: Diagnosis not present

## 2018-10-26 DIAGNOSIS — N2581 Secondary hyperparathyroidism of renal origin: Secondary | ICD-10-CM | POA: Diagnosis not present

## 2018-10-26 DIAGNOSIS — N185 Chronic kidney disease, stage 5: Secondary | ICD-10-CM | POA: Insufficient documentation

## 2018-10-26 MED ORDER — EPOETIN ALFA 10000 UNIT/ML IJ SOLN
INTRAMUSCULAR | Status: AC
  Filled 2018-10-26: qty 1

## 2018-10-26 MED ORDER — EPOETIN ALFA 10000 UNIT/ML IJ SOLN
10000.0000 [IU] | INTRAMUSCULAR | Status: DC
  Administered 2018-10-26: 10000 [IU] via SUBCUTANEOUS

## 2018-10-27 LAB — POCT HEMOGLOBIN-HEMACUE: Hemoglobin: 11 g/dL — ABNORMAL LOW (ref 13.0–17.0)

## 2018-10-31 DIAGNOSIS — R338 Other retention of urine: Secondary | ICD-10-CM | POA: Diagnosis not present

## 2018-11-08 ENCOUNTER — Other Ambulatory Visit: Payer: Self-pay

## 2018-11-09 ENCOUNTER — Ambulatory Visit (HOSPITAL_COMMUNITY)
Admission: RE | Admit: 2018-11-09 | Discharge: 2018-11-09 | Disposition: A | Discharge status: Home / Self Care | Payer: Medicare HMO | Source: Ambulatory Visit | Attending: Nephrology | Admitting: Nephrology

## 2018-11-09 ENCOUNTER — Other Ambulatory Visit: Payer: Self-pay | Admitting: Family Medicine

## 2018-11-09 ENCOUNTER — Telehealth: Payer: Self-pay | Admitting: Family Medicine

## 2018-11-09 VITALS — BP 130/79 | HR 95 | Temp 97.3°F | Resp 20

## 2018-11-09 DIAGNOSIS — N185 Chronic kidney disease, stage 5: Secondary | ICD-10-CM | POA: Insufficient documentation

## 2018-11-09 DIAGNOSIS — E119 Type 2 diabetes mellitus without complications: Secondary | ICD-10-CM

## 2018-11-09 DIAGNOSIS — R69 Illness, unspecified: Secondary | ICD-10-CM | POA: Diagnosis not present

## 2018-11-09 LAB — RENAL FUNCTION PANEL
Albumin: 3.8 g/dL (ref 3.5–5.0)
Anion gap: 13 (ref 5–15)
BUN: 73 mg/dL — ABNORMAL HIGH (ref 8–23)
CO2: 22 mmol/L (ref 22–32)
Calcium: 9.8 mg/dL (ref 8.9–10.3)
Chloride: 104 mmol/L (ref 98–111)
Creatinine, Ser: 4.38 mg/dL — ABNORMAL HIGH (ref 0.61–1.24)
GFR calc Af Amer: 13 mL/min — ABNORMAL LOW (ref >60)
GFR calc non Af Amer: 12 mL/min — ABNORMAL LOW (ref >60)
Glucose, Bld: 111 mg/dL — ABNORMAL HIGH (ref 70–99)
Phosphorus: 4.4 mg/dL (ref 2.5–4.6)
Potassium: 4 mmol/L (ref 3.5–5.1)
Sodium: 139 mmol/L (ref 135–145)

## 2018-11-09 LAB — IRON AND TIBC
Iron: 105 ug/dL (ref 45–182)
Saturation Ratios: 38 % (ref 17.9–39.5)
TIBC: 277 ug/dL (ref 250–450)
UIBC: 172 ug/dL

## 2018-11-09 LAB — FERRITIN: Ferritin: 357 ng/mL — ABNORMAL HIGH (ref 24–336)

## 2018-11-09 LAB — MAGNESIUM: Magnesium: 2.8 mg/dL — ABNORMAL HIGH (ref 1.7–2.4)

## 2018-11-09 LAB — POCT HEMOGLOBIN-HEMACUE: Hemoglobin: 10.6 g/dL — ABNORMAL LOW (ref 13.0–17.0)

## 2018-11-09 MED ORDER — GLUCOSE BLOOD VI STRP: ORAL_STRIP | 11 refills | Status: DC

## 2018-11-09 MED ORDER — EPOETIN ALFA 10000 UNIT/ML IJ SOLN
INTRAMUSCULAR | Status: AC
  Filled 2018-11-09: qty 1

## 2018-11-09 MED ORDER — EPOETIN ALFA 10000 UNIT/ML IJ SOLN
10000.0000 [IU] | INTRAMUSCULAR | Status: DC
  Administered 2018-11-09: 10000 [IU] via SUBCUTANEOUS

## 2018-11-09 NOTE — Telephone Encounter (Signed)
Called patient and he states that he needs level 3 testing  Control solution to make sure his meter is correct.  Ozella Almond, [place]

## 2018-11-09 NOTE — Telephone Encounter (Signed)
Testing strip Rx sent. Unsure what level 3 or 4 control solution is... please advise.  Richard Moses, [place], PGY-3

## 2018-11-09 NOTE — Telephone Encounter (Signed)
Patient needs a prescription for One Touch test strips and level 3 (or 4) control solution. To [facility] on [address].

## 2018-11-09 NOTE — Telephone Encounter (Signed)
Informed patient he does not need this solution to testing strips.  He is not on diabetic medications and does not need to check his glucose, however we have had this discussion in the past and he would like to continue checking.  Called in new prescription for testing strips.  Harriet Butte, [place], PGY-3

## 2018-11-13 ENCOUNTER — Encounter: Payer: Self-pay | Admitting: Sports Medicine

## 2018-11-13 ENCOUNTER — Other Ambulatory Visit: Payer: Self-pay

## 2018-11-13 ENCOUNTER — Ambulatory Visit (INDEPENDENT_AMBULATORY_CARE_PROVIDER_SITE_OTHER): Payer: Medicare HMO | Admitting: Sports Medicine

## 2018-11-13 ENCOUNTER — Ambulatory Visit: Payer: Medicare HMO | Admitting: Sports Medicine

## 2018-11-13 VITALS — BP 120/60 | Ht 71.0 in | Wt 146.0 lb

## 2018-11-13 DIAGNOSIS — M170 Bilateral primary osteoarthritis of knee: Secondary | ICD-10-CM

## 2018-11-13 NOTE — Progress Notes (Signed)
   Subjective:    Patient ID: Richard Moses, male    DOB: 05-05-1933, 85 y.o.   MRN: [id number]  HPI   Richard Moses comes in today for follow-up on bilateral knee pain.  Recent x-rays show advanced DJD in each knee, right greater than left.  His main difficulty is with navigating stairs.  He has been doing his home exercises and recently incorporated using an exercise bike.  He thinks that those are both helping.  Although I thought we gave him a compression sleeve at his last visit, patient notifies me that he has not been given one.  He denies any recent falls    Review of Systems As above    Objective:   Physical Exam  Well-developed, well-nourished.  No acute distress.  Awake alert and oriented x3.  Vital signs reviewed  Right knee: Patient has about a 5 degree extension lag.  Flexion to 120 degrees.  Bony hypertrophy secondary to DJD.  No effusion.  Knee is grossly stable to ligamentous exam.  Moderate quad weakness.  Left knee: Range of motion is 0 to 120 degrees.  Mild bony hypertrophy.  No effusion.  Good joint stability.  Moderate quad weakness.  X-rays as above      Assessment & Plan:   Bilateral knee pain secondary to DJD, right greater than left  I discussed the possibility of cortisone injections but the patient would like to wait on that.  Unfortunately, he spent $10,000 on stem cell injections 2 years ago and they were not helpful so he is wary of trying any other sort of injection.  I have encouraged him to continue with his exercise bike and continue with his quad strengthening home exercise program which was given to him at his last visit.  We will fit him with a compression sleeve today with instructions to wear it with activity.  He will not sleep in this.  He will continue to use over-the-counter topical medication for pain control and he will return to the office in 6 weeks for reevaluation.  I also discussed the possibility of formal physical therapy but he would  like to wait on that for now.  This note was dictated using Dragon naturally speaking software and may contain errors in syntax, spelling, or content which have not been identified prior to signing this note.

## 2018-11-23 ENCOUNTER — Ambulatory Visit (HOSPITAL_COMMUNITY)
Admission: RE | Admit: 2018-11-23 | Discharge: 2018-11-23 | Disposition: A | Discharge status: Home / Self Care | Payer: Medicare HMO | Source: Ambulatory Visit | Attending: Nephrology | Admitting: Nephrology

## 2018-11-23 ENCOUNTER — Other Ambulatory Visit: Payer: Self-pay

## 2018-11-23 VITALS — BP 146/79 | HR 86 | Temp 97.0°F | Resp 20

## 2018-11-23 DIAGNOSIS — N185 Chronic kidney disease, stage 5: Secondary | ICD-10-CM | POA: Diagnosis not present

## 2018-11-23 LAB — POCT HEMOGLOBIN-HEMACUE: Hemoglobin: 11.5 g/dL — ABNORMAL LOW (ref 13.0–17.0)

## 2018-11-23 MED ORDER — EPOETIN ALFA 10000 UNIT/ML IJ SOLN
10000.0000 [IU] | INTRAMUSCULAR | Status: DC
  Administered 2018-11-23: 10000 [IU] via SUBCUTANEOUS

## 2018-11-23 MED ORDER — EPOETIN ALFA 10000 UNIT/ML IJ SOLN
INTRAMUSCULAR | Status: AC
  Administered 2018-11-23: 10000 [IU] via SUBCUTANEOUS
  Filled 2018-11-23: qty 1

## 2018-12-05 DIAGNOSIS — R350 Frequency of micturition: Secondary | ICD-10-CM | POA: Diagnosis not present

## 2018-12-05 DIAGNOSIS — R338 Other retention of urine: Secondary | ICD-10-CM | POA: Diagnosis not present

## 2018-12-07 ENCOUNTER — Ambulatory Visit (HOSPITAL_COMMUNITY)
Admission: RE | Admit: 2018-12-07 | Discharge: 2018-12-07 | Disposition: A | Discharge status: Home / Self Care | Payer: Medicare HMO | Source: Ambulatory Visit | Attending: Nephrology | Admitting: Nephrology

## 2018-12-07 ENCOUNTER — Other Ambulatory Visit: Payer: Self-pay

## 2018-12-07 VITALS — BP 124/66 | HR 75 | Temp 97.3°F | Resp 20

## 2018-12-07 DIAGNOSIS — N185 Chronic kidney disease, stage 5: Secondary | ICD-10-CM | POA: Diagnosis not present

## 2018-12-07 LAB — IRON AND TIBC
Iron: 71 ug/dL (ref 45–182)
Saturation Ratios: 25 % (ref 17.9–39.5)
TIBC: 283 ug/dL (ref 250–450)
UIBC: 212 ug/dL

## 2018-12-07 LAB — RENAL FUNCTION PANEL
Albumin: 3.5 g/dL (ref 3.5–5.0)
Anion gap: 13 (ref 5–15)
BUN: 56 mg/dL — ABNORMAL HIGH (ref 8–23)
CO2: 21 mmol/L — ABNORMAL LOW (ref 22–32)
Calcium: 9.8 mg/dL (ref 8.9–10.3)
Chloride: 107 mmol/L (ref 98–111)
Creatinine, Ser: 4.24 mg/dL — ABNORMAL HIGH (ref 0.61–1.24)
GFR calc Af Amer: 14 mL/min — ABNORMAL LOW (ref >60)
GFR calc non Af Amer: 12 mL/min — ABNORMAL LOW (ref >60)
Glucose, Bld: 106 mg/dL — ABNORMAL HIGH (ref 70–99)
Phosphorus: 4.3 mg/dL (ref 2.5–4.6)
Potassium: 3.8 mmol/L (ref 3.5–5.1)
Sodium: 141 mmol/L (ref 135–145)

## 2018-12-07 LAB — FERRITIN: Ferritin: 285 ng/mL (ref 24–336)

## 2018-12-07 LAB — MAGNESIUM: Magnesium: 2.8 mg/dL — ABNORMAL HIGH (ref 1.7–2.4)

## 2018-12-07 MED ORDER — EPOETIN ALFA 10000 UNIT/ML IJ SOLN
INTRAMUSCULAR | Status: AC
  Administered 2018-12-07: 10000 [IU]
  Filled 2018-12-07: qty 1

## 2018-12-07 MED ORDER — EPOETIN ALFA 10000 UNIT/ML IJ SOLN: 10000.0000 [IU] | INTRAMUSCULAR | Status: DC

## 2018-12-11 LAB — POCT HEMOGLOBIN-HEMACUE: Hemoglobin: 11 g/dL — ABNORMAL LOW (ref 13.0–17.0)

## 2018-12-19 ENCOUNTER — Other Ambulatory Visit: Payer: Self-pay

## 2018-12-19 ENCOUNTER — Ambulatory Visit (INDEPENDENT_AMBULATORY_CARE_PROVIDER_SITE_OTHER): Payer: Medicare HMO | Admitting: Sports Medicine

## 2018-12-19 VITALS — BP 118/60 | Ht 71.0 in | Wt 146.0 lb

## 2018-12-19 DIAGNOSIS — M170 Bilateral primary osteoarthritis of knee: Secondary | ICD-10-CM

## 2018-12-19 NOTE — Progress Notes (Signed)
   Subjective:    Patient ID: Richard Moses, male    DOB: [birth year], 85 y.o.   MRN: [id number]  HPI   Mr. Wronski comes in today for follow-up on bilateral knee pain secondary to advanced DJD.  He is still struggling with pain, especially with going up and down stairs.  He does have a stair lift at home which helps tremendously.  He has also noticed that the body helix compression sleeves are helpful.  He has been doing his home exercises and also rides a stationary bike 3-4 times a week.  He states that he is only able to ride about 15 or 20 minutes before his knees start hurting.   Review of Systems As above    Objective:   Physical Exam  Well-developed, well-nourished.  No acute distress.  Awake alert and oriented x3.  Vital signs reviewed.  Examination of both knees shows good range of motion.  Significant bony hypertrophy bilaterally consistent with advanced DJD.  No effusion.  No soft tissue swelling.  Knees are grossly stable ligamentous exam.  Neurovascularly intact distally.      Assessment & Plan:   Bilateral knee pain secondary to DJD  I discussed treatment options including formal physical therapy, cortisone injections, and referral to orthopedics to discuss total knee arthroplasty.  Patient is not interested in surgery.  He states that injections have not been helpful in the past.  He is interested in trying formal physical therapy.  I will refer him to [facility] and he will follow-up with me again in 6 weeks.  He will continue to wear his body helix compression sleeves when active.  I also asked him to check the height of the seat on his exercise bike.  I educated him about the proper height.  He was very appreciative of that.  He will call me with questions or concerns prior to his follow-up visit.  This note was dictated using Dragon naturally speaking software and may contain errors in syntax, spelling, or content which have not been identified prior to signing this note.

## 2018-12-21 ENCOUNTER — Other Ambulatory Visit: Payer: Self-pay

## 2018-12-21 ENCOUNTER — Ambulatory Visit (HOSPITAL_COMMUNITY)
Admission: RE | Admit: 2018-12-21 | Discharge: 2018-12-21 | Disposition: A | Discharge status: Home / Self Care | Payer: Medicare HMO | Source: Ambulatory Visit | Attending: Nephrology | Admitting: Nephrology

## 2018-12-21 VITALS — BP 121/69 | HR 74 | Temp 97.3°F | Resp 20

## 2018-12-21 DIAGNOSIS — N185 Chronic kidney disease, stage 5: Secondary | ICD-10-CM | POA: Diagnosis present

## 2018-12-21 LAB — POCT HEMOGLOBIN-HEMACUE: Hemoglobin: 11 g/dL — ABNORMAL LOW (ref 13.0–17.0)

## 2018-12-21 MED ORDER — EPOETIN ALFA 10000 UNIT/ML IJ SOLN
INTRAMUSCULAR | Status: AC
  Administered 2018-12-21: 08:00:00 10000 [IU]
  Filled 2018-12-21: qty 1

## 2018-12-21 MED ORDER — EPOETIN ALFA 10000 UNIT/ML IJ SOLN: 10000.0000 [IU] | INTRAMUSCULAR | Status: DC

## 2018-12-22 ENCOUNTER — Encounter (HOSPITAL_COMMUNITY): Payer: Self-pay

## 2018-12-22 ENCOUNTER — Other Ambulatory Visit: Payer: Self-pay

## 2018-12-22 ENCOUNTER — Emergency Department (HOSPITAL_COMMUNITY)
Admission: EM | Admit: 2018-12-22 | Discharge: 2018-12-23 | Disposition: A | Discharge status: Home / Self Care | Payer: Medicare HMO | Attending: Emergency Medicine | Admitting: Emergency Medicine

## 2018-12-22 DIAGNOSIS — N390 Urinary tract infection, site not specified: Secondary | ICD-10-CM

## 2018-12-22 DIAGNOSIS — T83091A Other mechanical complication of indwelling urethral catheter, initial encounter: Secondary | ICD-10-CM | POA: Insufficient documentation

## 2018-12-22 DIAGNOSIS — T839XXA Unspecified complication of genitourinary prosthetic device, implant and graft, initial encounter: Secondary | ICD-10-CM

## 2018-12-22 DIAGNOSIS — R1030 Lower abdominal pain, unspecified: Secondary | ICD-10-CM | POA: Diagnosis present

## 2018-12-22 DIAGNOSIS — Z87891 Personal history of nicotine dependence: Secondary | ICD-10-CM | POA: Diagnosis not present

## 2018-12-22 DIAGNOSIS — E039 Hypothyroidism, unspecified: Secondary | ICD-10-CM | POA: Insufficient documentation

## 2018-12-22 DIAGNOSIS — E119 Type 2 diabetes mellitus without complications: Secondary | ICD-10-CM | POA: Insufficient documentation

## 2018-12-22 DIAGNOSIS — Z79899 Other long term (current) drug therapy: Secondary | ICD-10-CM | POA: Insufficient documentation

## 2018-12-22 DIAGNOSIS — I120 Hypertensive chronic kidney disease with stage 5 chronic kidney disease or end stage renal disease: Secondary | ICD-10-CM | POA: Diagnosis not present

## 2018-12-22 DIAGNOSIS — N185 Chronic kidney disease, stage 5: Secondary | ICD-10-CM | POA: Diagnosis not present

## 2018-12-22 DIAGNOSIS — T83098A Other mechanical complication of other indwelling urethral catheter, initial encounter: Secondary | ICD-10-CM | POA: Diagnosis not present

## 2018-12-22 DIAGNOSIS — Y69 Unspecified misadventure during surgical and medical care: Secondary | ICD-10-CM | POA: Insufficient documentation

## 2018-12-22 MED ORDER — LIDOCAINE HCL URETHRAL/MUCOSAL 2 % EX GEL
1.0000 "application " | Freq: Once | CUTANEOUS | Status: AC
  Administered 2018-12-22: 1 via URETHRAL
  Filled 2018-12-22: qty 5

## 2018-12-22 NOTE — ED Triage Notes (Signed)
Pt reports that he was trying to flush his urinary catheter this evening and he was unable to flush it. States that he has had a foley cath for about 3 years.

## 2018-12-22 NOTE — ED Provider Notes (Signed)
[facility] DEPT Provider Note   CSN: [id number] Arrival date & time: 12/22/18  2208     History   Chief Complaint Chief Complaint  Patient presents with  . Urinary Catheter Blockage    HPI Richard Moses is a 85 y.o. male.     The history is provided by the patient.  Patient presents with Foley catheter complication.  Patient reports that he is used a Foley catheter for 3 years.  Tonight he noted that he could not flush his catheter.  He is now having some lower abdominal pain.  No fevers or vomiting.  No flank pain.  He had otherwise been feeling well.  Past Medical History:  Diagnosis Date  . Abnormal MRI, cervical spine 09/29/10   Mildly abnormal MRI cervical spine demonstrating mild spondylosis and disc bulging from C4-5 down to C7-T1.  No spinal stenosis or foraminal narrowing  . Anemia   . Arthritis   . Blood transfusion without reported diagnosis   . Cancer [facility])    possible melanoma on lt.leg  . Cataract    beginning stage  . Cervical disc herniation 09/29/10   C4-C5 and C7-T1  . Chronic kidney disease (CKD)   . Chronic left-sided headaches   . Colon polyp 2007  . Diabetes mellitus   . External hemorrhoids without mention of complication 2505  . GERD (gastroesophageal reflux disease)   . Hyperlipidemia   . Hypertension   . Hypothyroidism 09/29/10   Untreated. Patient not interested in Rx.   . Intermittent self-catheterization of bladder    placed every month  . Knee pain, bilateral 02/05/2013   Pain since a fall in 2014  . MRI of brain abnormal 09/29/10   Abnormal MRI of brain, demonstrating mild atrophy  . Neck pain on left side 2012    Patient Active Problem List   Diagnosis Date Noted  . Secondary hyperparathyroidism ([place]) 03/03/2018  . Memory disturbance 03/03/2017  . Bilateral knee pain 03/03/2017  . Bilateral cataracts 01/17/2017  . Peripheral neuropathy 11/30/2016  . Anemia associated with stage 5 chronic renal  failure ([place]) 07/14/2016  . Benign non-nodular prostatic hyperplasia with lower urinary tract symptoms 07/11/2014  . Bilateral hydronephrosis 04/26/2014  . Incomplete bladder emptying 04/26/2014  . Urinary retention 04/22/2014  . Chronic kidney disease, stage V ([place]) 04/17/2014  . Dysphagia, pharyngoesophageal phase 09/11/2012  . Primary hypertension 12/25/2009  . Elevated PSA 05/20/2009  . Diet-controlled diabetes mellitus ([place]) 02/18/2009    Past Surgical History:  Procedure Laterality Date  . COLONOSCOPY    . MELANOMA EXCISION     left side of neck  . POLYPECTOMY    . RHINOPLASTY     x2         Home Medications    Prior to Admission medications   Medication Sig Start Date End Date Taking? Authorizing Provider  Barberry-Oreg Grape-Goldenseal (BERBERINE COMPLEX PO) Take 500 mg by mouth 3 (three) times daily with meals. Reported on 12/11/2015   Yes [provider]  furosemide (LASIX) 20 MG tablet Take 1 tablet (20 mg total) by mouth daily. 09/18/18  Yes Martyn Malay, MD  Multiple Vitamins-Minerals (MENS MULTIVITAMIN PLUS) TABS Take 1 tablet by mouth daily after breakfast.   Yes [provider]  acetaminophen (TYLENOL 8 HOUR) 650 MG CR tablet Take 1 tablet (650 mg total) by mouth every 8 (eight) hours as needed for pain. Patient not taking: Reported on 12/22/2018 09/18/18   Martyn Malay, MD  glucose blood (ONETOUCH VERIO) test strip USE TO TEST BLOOD SUGAR ONCE DAILY 11/09/18   [place] Bing, DO    Family History Family History  Problem Relation Age of Onset  . Cancer Father 47       Renal CA  . Cancer Sister        Brain   . Cancer Brother        Brain  . Colon cancer Neg Hx   . Stomach cancer Neg Hx   . Colon polyps Neg Hx   . Esophageal cancer Neg Hx   . Rectal cancer Neg Hx     Social History Social History   Tobacco Use  . Smoking status: Former Smoker    Types: Cigarettes    Quit date: 12/06/1966    Years since quitting: 52.0  .  Smokeless tobacco: Never Used  Substance Use Topics  . Alcohol use: Yes    Alcohol/week: 0.0 standard drinks    Comment: occasionally - had cut back since diabetes  . Drug use: No     Allergies   Patient has no known allergies.   Review of Systems Review of Systems  Constitutional: Negative for fever.  Gastrointestinal: Positive for abdominal pain. Negative for vomiting.  Genitourinary: Positive for difficulty urinating.  All other systems reviewed and are negative.    Physical Exam Updated Vital Signs BP (!) 163/85 (BP Location: Right Arm)   Pulse 100   Temp 98.4 F (36.9 C) (Oral)   Resp 16   SpO2 100%   Physical Exam  CONSTITUTIONAL: Elderly HEAD: Normocephalic/atraumatic EYES: EOMI ENMT: Mucous membranes moist NECK: supple no meningeal signs CV: S1/S2 noted, murmur noted LUNGS: Lungs are clear to auscultation bilaterally, no apparent distress ABDOMEN: soft, nontender, no rebound or guarding, bowel sounds noted throughout abdomen GU: Catheter in place, no signs of trauma, no bleeding, nurse chaperone present NEURO: Pt is awake/alert/appropriate, moves all extremitiesx4.  No facial droop.   EXTREMITIES:  full ROM SKIN: warm, color normal PSYCH: no abnormalities of mood noted, alert and oriented to situation  ED Treatments / Results  Labs (all labs ordered are listed, but only abnormal results are displayed) Labs Reviewed  URINALYSIS, ROUTINE W REFLEX MICROSCOPIC - Abnormal; Notable for the following components:      Result Value   Color, Urine AMBER (*)    APPearance CLOUDY (*)    Glucose, UA 50 (*)    Hgb urine dipstick LARGE (*)    Protein, ur 100 (*)    Nitrite POSITIVE (*)    Leukocytes,Ua LARGE (*)    RBC / HPF >50 (*)    WBC, UA >50 (*)    Bacteria, UA MANY (*)    Non Squamous Epithelial 0-5 (*)    All other components within normal limits  URINE CULTURE    EKG None  Radiology No results found.  Procedures Procedures   Medications  Ordered in ED Medications  lidocaine (XYLOCAINE) 2 % jelly 1 application (1 application Urethral Given 12/22/18 2344)     Initial Impression / Assessment and Plan / ED Course  I have reviewed the triage vital signs and the nursing notes.  Pertinent labs   results that were available during my care of the patient were reviewed by me and considered in my medical decision making (see chart for details).        Patient with urinary catheter obstruction.  This was changed and he is now improved.  Catheter is flowing freely. He  is noted to have a UTI.  Keflex was sent to his pharmacy.  Will discharge  Final Clinical Impressions(s) / ED Diagnoses   Final diagnoses:  Complication of Foley catheter, initial encounter ([place])  Complicated UTI (urinary tract infection)    ED Discharge Orders         Ordered    cephALEXin (KEFLEX) 500 MG capsule  3 times daily     12/23/18 0018           Ripley Fraise, MD 12/23/18 0022

## 2018-12-23 LAB — URINALYSIS, ROUTINE W REFLEX MICROSCOPIC
Bilirubin Urine: NEGATIVE
Glucose, UA: 50 mg/dL — AB
Ketones, ur: NEGATIVE mg/dL
Nitrite: POSITIVE — AB
Protein, ur: 100 mg/dL — AB
RBC / HPF: >50 RBC/hpf — ABNORMAL HIGH (ref 0–5)
Specific Gravity, Urine: 1.013 (ref 1.005–1.030)
WBC, UA: >50 WBC/hpf — ABNORMAL HIGH (ref 0–5)
pH: 6 (ref 5.0–8.0)

## 2018-12-23 MED ORDER — CEPHALEXIN 500 MG PO CAPS: 500.0000 mg | ORAL_CAPSULE | Freq: Three times a day (TID) | ORAL | 0 refills | Status: DC

## 2018-12-24 LAB — URINE CULTURE

## 2018-12-25 ENCOUNTER — Ambulatory Visit: Payer: Medicare HMO | Admitting: Sports Medicine

## 2018-12-27 DIAGNOSIS — M25561 Pain in right knee: Secondary | ICD-10-CM | POA: Diagnosis not present

## 2018-12-27 DIAGNOSIS — M25661 Stiffness of right knee, not elsewhere classified: Secondary | ICD-10-CM | POA: Diagnosis not present

## 2018-12-27 DIAGNOSIS — M25662 Stiffness of left knee, not elsewhere classified: Secondary | ICD-10-CM | POA: Diagnosis not present

## 2018-12-27 DIAGNOSIS — R262 Difficulty in walking, not elsewhere classified: Secondary | ICD-10-CM | POA: Diagnosis not present

## 2018-12-27 DIAGNOSIS — M6281 Muscle weakness (generalized): Secondary | ICD-10-CM | POA: Diagnosis not present

## 2018-12-27 DIAGNOSIS — M25562 Pain in left knee: Secondary | ICD-10-CM | POA: Diagnosis not present

## 2018-12-27 DIAGNOSIS — R2681 Unsteadiness on feet: Secondary | ICD-10-CM | POA: Diagnosis not present

## 2019-01-02 DIAGNOSIS — M25561 Pain in right knee: Secondary | ICD-10-CM | POA: Diagnosis not present

## 2019-01-02 DIAGNOSIS — R2681 Unsteadiness on feet: Secondary | ICD-10-CM | POA: Diagnosis not present

## 2019-01-02 DIAGNOSIS — R262 Difficulty in walking, not elsewhere classified: Secondary | ICD-10-CM | POA: Diagnosis not present

## 2019-01-02 DIAGNOSIS — M25562 Pain in left knee: Secondary | ICD-10-CM | POA: Diagnosis not present

## 2019-01-02 DIAGNOSIS — M6281 Muscle weakness (generalized): Secondary | ICD-10-CM | POA: Diagnosis not present

## 2019-01-02 DIAGNOSIS — M25661 Stiffness of right knee, not elsewhere classified: Secondary | ICD-10-CM | POA: Diagnosis not present

## 2019-01-02 DIAGNOSIS — M25662 Stiffness of left knee, not elsewhere classified: Secondary | ICD-10-CM | POA: Diagnosis not present

## 2019-01-04 ENCOUNTER — Ambulatory Visit (HOSPITAL_COMMUNITY)
Admission: RE | Admit: 2019-01-04 | Discharge: 2019-01-04 | Disposition: A | Discharge status: Home / Self Care | Payer: Medicare HMO | Source: Ambulatory Visit | Attending: Nephrology | Admitting: Nephrology

## 2019-01-04 ENCOUNTER — Other Ambulatory Visit: Payer: Self-pay

## 2019-01-04 VITALS — BP 118/73 | HR 100 | Temp 97.0°F | Resp 20

## 2019-01-04 DIAGNOSIS — M6281 Muscle weakness (generalized): Secondary | ICD-10-CM | POA: Diagnosis not present

## 2019-01-04 DIAGNOSIS — M25561 Pain in right knee: Secondary | ICD-10-CM | POA: Diagnosis not present

## 2019-01-04 DIAGNOSIS — R262 Difficulty in walking, not elsewhere classified: Secondary | ICD-10-CM | POA: Diagnosis not present

## 2019-01-04 DIAGNOSIS — N185 Chronic kidney disease, stage 5: Secondary | ICD-10-CM | POA: Insufficient documentation

## 2019-01-04 DIAGNOSIS — M25562 Pain in left knee: Secondary | ICD-10-CM | POA: Diagnosis not present

## 2019-01-04 DIAGNOSIS — M25661 Stiffness of right knee, not elsewhere classified: Secondary | ICD-10-CM | POA: Diagnosis not present

## 2019-01-04 DIAGNOSIS — M25662 Stiffness of left knee, not elsewhere classified: Secondary | ICD-10-CM | POA: Diagnosis not present

## 2019-01-04 DIAGNOSIS — R2681 Unsteadiness on feet: Secondary | ICD-10-CM | POA: Diagnosis not present

## 2019-01-04 LAB — POCT HEMOGLOBIN-HEMACUE: Hemoglobin: 12.7 g/dL — ABNORMAL LOW (ref 13.0–17.0)

## 2019-01-04 MED ORDER — EPOETIN ALFA 10000 UNIT/ML IJ SOLN: 10000.0000 [IU] | INTRAMUSCULAR | Status: DC

## 2019-01-08 DIAGNOSIS — M25662 Stiffness of left knee, not elsewhere classified: Secondary | ICD-10-CM | POA: Diagnosis not present

## 2019-01-08 DIAGNOSIS — M25661 Stiffness of right knee, not elsewhere classified: Secondary | ICD-10-CM | POA: Diagnosis not present

## 2019-01-08 DIAGNOSIS — M25561 Pain in right knee: Secondary | ICD-10-CM | POA: Diagnosis not present

## 2019-01-08 DIAGNOSIS — M6281 Muscle weakness (generalized): Secondary | ICD-10-CM | POA: Diagnosis not present

## 2019-01-08 DIAGNOSIS — M25562 Pain in left knee: Secondary | ICD-10-CM | POA: Diagnosis not present

## 2019-01-08 DIAGNOSIS — R2681 Unsteadiness on feet: Secondary | ICD-10-CM | POA: Diagnosis not present

## 2019-01-08 DIAGNOSIS — R262 Difficulty in walking, not elsewhere classified: Secondary | ICD-10-CM | POA: Diagnosis not present

## 2019-01-12 ENCOUNTER — Emergency Department (HOSPITAL_COMMUNITY)
Admission: EM | Admit: 2019-01-12 | Discharge: 2019-01-13 | Disposition: A | Discharge status: Home / Self Care | Payer: Medicare HMO | Attending: Emergency Medicine | Admitting: Emergency Medicine

## 2019-01-12 ENCOUNTER — Other Ambulatory Visit: Payer: Self-pay

## 2019-01-12 ENCOUNTER — Encounter (HOSPITAL_COMMUNITY): Payer: Self-pay

## 2019-01-12 DIAGNOSIS — Y69 Unspecified misadventure during surgical and medical care: Secondary | ICD-10-CM | POA: Insufficient documentation

## 2019-01-12 DIAGNOSIS — I129 Hypertensive chronic kidney disease with stage 1 through stage 4 chronic kidney disease, or unspecified chronic kidney disease: Secondary | ICD-10-CM | POA: Insufficient documentation

## 2019-01-12 DIAGNOSIS — Z87891 Personal history of nicotine dependence: Secondary | ICD-10-CM | POA: Diagnosis not present

## 2019-01-12 DIAGNOSIS — M25562 Pain in left knee: Secondary | ICD-10-CM | POA: Diagnosis not present

## 2019-01-12 DIAGNOSIS — N390 Urinary tract infection, site not specified: Secondary | ICD-10-CM | POA: Insufficient documentation

## 2019-01-12 DIAGNOSIS — E1122 Type 2 diabetes mellitus with diabetic chronic kidney disease: Secondary | ICD-10-CM | POA: Insufficient documentation

## 2019-01-12 DIAGNOSIS — T83511A Infection and inflammatory reaction due to indwelling urethral catheter, initial encounter: Secondary | ICD-10-CM | POA: Insufficient documentation

## 2019-01-12 DIAGNOSIS — N189 Chronic kidney disease, unspecified: Secondary | ICD-10-CM | POA: Insufficient documentation

## 2019-01-12 DIAGNOSIS — M6281 Muscle weakness (generalized): Secondary | ICD-10-CM | POA: Diagnosis not present

## 2019-01-12 DIAGNOSIS — M25561 Pain in right knee: Secondary | ICD-10-CM | POA: Diagnosis not present

## 2019-01-12 DIAGNOSIS — M25661 Stiffness of right knee, not elsewhere classified: Secondary | ICD-10-CM | POA: Diagnosis not present

## 2019-01-12 DIAGNOSIS — Z85828 Personal history of other malignant neoplasm of skin: Secondary | ICD-10-CM | POA: Insufficient documentation

## 2019-01-12 DIAGNOSIS — T839XXA Unspecified complication of genitourinary prosthetic device, implant and graft, initial encounter: Secondary | ICD-10-CM

## 2019-01-12 DIAGNOSIS — E039 Hypothyroidism, unspecified: Secondary | ICD-10-CM | POA: Diagnosis not present

## 2019-01-12 DIAGNOSIS — Z79899 Other long term (current) drug therapy: Secondary | ICD-10-CM | POA: Insufficient documentation

## 2019-01-12 DIAGNOSIS — R2681 Unsteadiness on feet: Secondary | ICD-10-CM | POA: Diagnosis not present

## 2019-01-12 DIAGNOSIS — R262 Difficulty in walking, not elsewhere classified: Secondary | ICD-10-CM | POA: Diagnosis not present

## 2019-01-12 DIAGNOSIS — M25662 Stiffness of left knee, not elsewhere classified: Secondary | ICD-10-CM | POA: Diagnosis not present

## 2019-01-12 DIAGNOSIS — T83098A Other mechanical complication of other indwelling urethral catheter, initial encounter: Secondary | ICD-10-CM | POA: Diagnosis not present

## 2019-01-12 NOTE — ED Triage Notes (Signed)
Pt reports that he was trying to flush his urinary catheter and was unable to. He states that he wanted to come in before he started retaining too much.

## 2019-01-12 NOTE — ED Notes (Signed)
Pt upset about wait time and slammed the triage door when told that we are working as fast as we can.

## 2019-01-13 LAB — URINALYSIS, ROUTINE W REFLEX MICROSCOPIC
Bilirubin Urine: NEGATIVE
Glucose, UA: NEGATIVE mg/dL
Ketones, ur: NEGATIVE mg/dL
Nitrite: POSITIVE — AB
Protein, ur: 100 mg/dL — AB
Specific Gravity, Urine: 1.009 (ref 1.005–1.030)
pH: 8 (ref 5.0–8.0)

## 2019-01-13 MED ORDER — CEPHALEXIN 500 MG PO CAPS
500.0000 mg | ORAL_CAPSULE | Freq: Once | ORAL | Status: AC
  Administered 2019-01-13: 500 mg via ORAL
  Filled 2019-01-13: qty 1

## 2019-01-13 MED ORDER — LIDOCAINE HCL URETHRAL/MUCOSAL 2 % EX GEL
CUTANEOUS | Status: AC
  Administered 2019-01-13: 1
  Filled 2019-01-13: qty 11

## 2019-01-13 MED ORDER — CEPHALEXIN 500 MG PO CAPS: 500.0000 mg | ORAL_CAPSULE | Freq: Three times a day (TID) | ORAL | 0 refills | Status: AC

## 2019-01-13 NOTE — ED Notes (Signed)
Patient contact updated per request of patient.

## 2019-01-13 NOTE — ED Provider Notes (Signed)
[facility] Provider Note  CSN: [id number] Arrival date & time: 01/12/19 2212  Chief Complaint(s) Foley Clogged  HPI ERCEL PEPITONE is a 85 y.o. male with extensive past medical history listed below including urinary retention requiring indwelling Foley catheter who presents to the emergency department with a clogged Foley.  Patient attempted to flush at home but was unsuccessful.  Initially denied any suprapubic discomfort but began having discomfort upon arrival here.  Denies any other physical complaints.  HPI  Past Medical History Past Medical History:  Diagnosis Date   Abnormal MRI, cervical spine 09/29/10   Mildly abnormal MRI cervical spine demonstrating mild spondylosis and disc bulging from C4-5 down to C7-T1.  No spinal stenosis or foraminal narrowing   Anemia    Arthritis    Blood transfusion without reported diagnosis    Cancer ([place])    possible melanoma on lt.leg   Cataract    beginning stage   Cervical disc herniation 09/29/10   C4-C5 and C7-T1   Chronic kidney disease (CKD)    Chronic left-sided headaches    Colon polyp 2007   Diabetes mellitus    External hemorrhoids without mention of complication 8099   GERD (gastroesophageal reflux disease)    Hyperlipidemia    Hypertension    Hypothyroidism 09/29/10   Untreated. Patient not interested in Rx.    Intermittent self-catheterization of bladder    placed every month   Knee pain, bilateral 02/05/2013   Pain since a fall in 2014   MRI of brain abnormal 09/29/10   Abnormal MRI of brain, demonstrating mild atrophy   Neck pain on left side 2012   Patient Active Problem List   Diagnosis Date Noted   Secondary hyperparathyroidism ([place]) 03/03/2018   Memory disturbance 03/03/2017   Bilateral knee pain 03/03/2017   Bilateral cataracts 01/17/2017   Peripheral neuropathy 11/30/2016   Anemia associated with stage 5 chronic renal failure ([place]) 07/14/2016    Benign non-nodular prostatic hyperplasia with lower urinary tract symptoms 07/11/2014   Bilateral hydronephrosis 04/26/2014   Incomplete bladder emptying 04/26/2014   Urinary retention 04/22/2014   Chronic kidney disease, stage V ([place]) 04/17/2014   Dysphagia, pharyngoesophageal phase 09/11/2012   Primary hypertension 12/25/2009   Elevated PSA 05/20/2009   Diet-controlled diabetes mellitus ([place]) 02/18/2009   Home Medication(s) Prior to Admission medications   Medication Sig Start Date End Date Taking? Authorizing Provider  acetaminophen (TYLENOL 8 HOUR) 650 MG CR tablet Take 1 tablet (650 mg total) by mouth every 8 (eight) hours as needed for pain. Patient not taking: Reported on 12/22/2018 09/18/18   Martyn Malay, MD  Barberry-Oreg Grape-Goldenseal (BERBERINE COMPLEX PO) Take 500 mg by mouth 3 (three) times daily with meals. Reported on 12/11/2015    [provider]  cephALEXin (KEFLEX) 500 MG capsule Take 1 capsule (500 mg total) by mouth 3 (three) times daily for 7 days. 01/13/19 01/20/19  Fatima Blank, MD  furosemide (LASIX) 20 MG tablet Take 1 tablet (20 mg total) by mouth daily. 09/18/18   Martyn Malay, MD  glucose blood [facility] VERIO) test strip USE TO TEST BLOOD SUGAR ONCE DAILY 11/09/18   [place] Bing, DO  Multiple Vitamins-Minerals (MENS MULTIVITAMIN PLUS) TABS Take 1 tablet by mouth daily after breakfast.    [provider]  Past Surgical History Past Surgical History:  Procedure Laterality Date   COLONOSCOPY     MELANOMA EXCISION     left side of neck   POLYPECTOMY     RHINOPLASTY     x2    Family History Family History  Problem Relation Age of Onset   Cancer Father 54       Renal CA   Cancer Sister        Brain    Cancer Brother        Brain   Colon cancer Neg Hx    Stomach cancer Neg Hx      Colon polyps Neg Hx    Esophageal cancer Neg Hx    Rectal cancer Neg Hx     Social History Social History   Tobacco Use   Smoking status: Former Smoker    Types: Cigarettes    Quit date: 12/06/1966    Years since quitting: 52.1   Smokeless tobacco: Never Used  Substance Use Topics   Alcohol use: Yes    Alcohol/week: 0.0 standard drinks    Comment: occasionally - had cut back since diabetes   Drug use: No   Allergies Patient has no known allergies.  Review of Systems Review of Systems All other systems are reviewed and are negative for acute change except as noted in the HPI  Physical Exam Vital Signs  I have reviewed the triage vital signs BP (!) 147/96 (BP Location: Left Arm)    Pulse 84    Temp 98 F (36.7 C) (Oral)    Resp 18    SpO2 100%   Physical Exam Vitals signs reviewed.  Constitutional:      General: He is not in acute distress.    Appearance: He is well-developed. He is not diaphoretic.  HENT:     Head: Normocephalic and atraumatic.     Jaw: No trismus.     Right Ear: External ear normal.     Left Ear: External ear normal.     Nose: Nose normal.  Eyes:     General: No scleral icterus.    Conjunctiva/sclera: Conjunctivae normal.  Neck:     Musculoskeletal: Normal range of motion.     Trachea: Phonation normal.  Cardiovascular:     Rate and Rhythm: Normal rate and regular rhythm.  Pulmonary:     Effort: Pulmonary effort is normal. No respiratory distress.     Breath sounds: No stridor.  Abdominal:     General: There is no distension.     Tenderness: There is no abdominal tenderness.  Musculoskeletal: Normal range of motion.  Neurological:     Mental Status: He is alert and oriented to person, place, and time.  Psychiatric:        Behavior: Behavior normal.     ED Results and Treatments Labs (all labs ordered are listed, but only abnormal results are displayed) Labs Reviewed  URINALYSIS, ROUTINE W REFLEX MICROSCOPIC - Abnormal;  Notable for the following components:      Result Value   APPearance HAZY (*)    Hgb urine dipstick SMALL (*)    Protein, ur 100 (*)    Nitrite POSITIVE (*)    Leukocytes,Ua LARGE (*)    Bacteria, UA MANY (*)    All other components within normal limits  URINE CULTURE  EKG  EKG Interpretation  Date/Time:    Ventricular Rate:    PR Interval:    QRS Duration:   QT Interval:    QTC Calculation:   R Axis:     Text Interpretation:        Radiology No results found.  Pertinent labs & imaging results that were available during my care of the patient were reviewed by me and considered in my medical decision making (see chart for details).  Medications Ordered in ED Medications  cephALEXin (KEFLEX) capsule 500 mg (has no administration in time range)  lidocaine (XYLOCAINE) 2 % jelly (1 application  Given 07/15/23 0026)                                                                                                                                    Procedures Procedures  (including critical care time)  Medical Decision Making / ED Course I have reviewed the nursing notes for this encounter and the patient's prior records (if available in EHR or on provided paperwork).   JALEEN FINCH was evaluated in Emergency Department on 01/13/2019 for the symptoms described in the history of present illness. He was evaluated in the context of the global COVID-19 pandemic, which necessitated consideration that the patient might be at risk for infection with the SARS-CoV-2 virus that causes COVID-19. Institutional protocols and algorithms that pertain to the evaluation of patients at risk for COVID-19 are in a state of rapid change based on information released by regulatory bodies including the CDC and federal and state organizations. These policies and algorithms were followed  during the patient's care in the ED.  Attempt to flush was unsuccessful.  Foley catheter was replaced and draining well.  Noted to have granulated tissue and sediment in the catheter.  UA and urine culture sent.  Review of records patient had evidence of urinary tract infection 3 weeks ago, when seen for a clogged Foley catheter as well.  He was treated with Keflex.  Urinary culture grew out multiple bacteria morphotypes.  UA consistent with urinary tract infection.  Will provide prescription for Keflex. Recommended close follow-up with urology.      Final Clinical Impression(s) / ED Diagnoses Final diagnoses:  Urinary tract infection associated with indwelling urethral catheter, initial encounter ([place])  Urinary catheter complication, initial encounter [facility])    The patient appears reasonably screened and/or stabilized for discharge and I doubt any other medical condition or other [facility] requiring further screening, evaluation, or treatment in the ED at this time prior to discharge.  Disposition: Discharge  Condition: Good  I have discussed the results, Dx and Tx plan with the patient who expressed understanding and agree(s) with the plan. Discharge instructions discussed at great length. The patient was given strict return precautions who verbalized understanding of the instructions. No further questions at time of discharge.    ED Discharge Orders  Ordered    cephALEXin (KEFLEX) 500 MG capsule  3 times daily     01/13/19 0231           Follow Up: [facility] [phone] Schedule an appointment as soon as possible for a visit  in 3-5 days      This chart was dictated using voice recognition software.  Despite best efforts to proofread,  errors can occur which can change the documentation meaning.   Fatima Blank, MD 01/13/19 [phone]

## 2019-01-13 NOTE — ED Notes (Signed)
Patient had another BM. Assisted patient with personal care and getting dressed. Discharge instructions reviewed with patient. Opportunity for questions and concerns allowed. Standard drainage bag changed to leg beg and foley care instructions reviewed with patient. Patient alert, stable, and ambulatory at discharge.

## 2019-01-13 NOTE — ED Notes (Addendum)
Pt called out requesting someone come into his room.  Went to pt's room and asked what he needed and pt replied "I've pooped the bed."  RN assisted pt with standing and the pt was cleaned while the bed linens were changed. Pt was put back into the bed and put back on the monitor.

## 2019-01-14 LAB — URINE CULTURE

## 2019-01-15 DIAGNOSIS — R2681 Unsteadiness on feet: Secondary | ICD-10-CM | POA: Diagnosis not present

## 2019-01-15 DIAGNOSIS — M25662 Stiffness of left knee, not elsewhere classified: Secondary | ICD-10-CM | POA: Diagnosis not present

## 2019-01-15 DIAGNOSIS — M6281 Muscle weakness (generalized): Secondary | ICD-10-CM | POA: Diagnosis not present

## 2019-01-15 DIAGNOSIS — R262 Difficulty in walking, not elsewhere classified: Secondary | ICD-10-CM | POA: Diagnosis not present

## 2019-01-15 DIAGNOSIS — M25561 Pain in right knee: Secondary | ICD-10-CM | POA: Diagnosis not present

## 2019-01-15 DIAGNOSIS — M25661 Stiffness of right knee, not elsewhere classified: Secondary | ICD-10-CM | POA: Diagnosis not present

## 2019-01-15 DIAGNOSIS — M25562 Pain in left knee: Secondary | ICD-10-CM | POA: Diagnosis not present

## 2019-01-18 ENCOUNTER — Other Ambulatory Visit: Payer: Self-pay

## 2019-01-18 ENCOUNTER — Ambulatory Visit (HOSPITAL_COMMUNITY)
Admission: RE | Admit: 2019-01-18 | Discharge: 2019-01-18 | Disposition: A | Discharge status: Home / Self Care | Payer: Medicare HMO | Source: Ambulatory Visit | Attending: Nephrology | Admitting: Nephrology

## 2019-01-18 VITALS — BP 124/63 | HR 88 | Temp 97.3°F | Resp 20

## 2019-01-18 DIAGNOSIS — R2681 Unsteadiness on feet: Secondary | ICD-10-CM | POA: Diagnosis not present

## 2019-01-18 DIAGNOSIS — M25661 Stiffness of right knee, not elsewhere classified: Secondary | ICD-10-CM | POA: Diagnosis not present

## 2019-01-18 DIAGNOSIS — M25562 Pain in left knee: Secondary | ICD-10-CM | POA: Diagnosis not present

## 2019-01-18 DIAGNOSIS — N185 Chronic kidney disease, stage 5: Secondary | ICD-10-CM | POA: Insufficient documentation

## 2019-01-18 DIAGNOSIS — R262 Difficulty in walking, not elsewhere classified: Secondary | ICD-10-CM | POA: Diagnosis not present

## 2019-01-18 DIAGNOSIS — M6281 Muscle weakness (generalized): Secondary | ICD-10-CM | POA: Diagnosis not present

## 2019-01-18 DIAGNOSIS — M25662 Stiffness of left knee, not elsewhere classified: Secondary | ICD-10-CM | POA: Diagnosis not present

## 2019-01-18 DIAGNOSIS — M25561 Pain in right knee: Secondary | ICD-10-CM | POA: Diagnosis not present

## 2019-01-18 LAB — RENAL FUNCTION PANEL
Albumin: 3.6 g/dL (ref 3.5–5.0)
Anion gap: 15 (ref 5–15)
BUN: 61 mg/dL — ABNORMAL HIGH (ref 8–23)
CO2: 19 mmol/L — ABNORMAL LOW (ref 22–32)
Calcium: 9.6 mg/dL (ref 8.9–10.3)
Chloride: 107 mmol/L (ref 98–111)
Creatinine, Ser: 4.07 mg/dL — ABNORMAL HIGH (ref 0.61–1.24)
GFR calc Af Amer: 15 mL/min — ABNORMAL LOW (ref >60)
GFR calc non Af Amer: 13 mL/min — ABNORMAL LOW (ref >60)
Glucose, Bld: 108 mg/dL — ABNORMAL HIGH (ref 70–99)
Phosphorus: 3.8 mg/dL (ref 2.5–4.6)
Potassium: 3.7 mmol/L (ref 3.5–5.1)
Sodium: 141 mmol/L (ref 135–145)

## 2019-01-18 LAB — IRON AND TIBC
Iron: 80 ug/dL (ref 45–182)
Saturation Ratios: 33 % (ref 17.9–39.5)
TIBC: 241 ug/dL — ABNORMAL LOW (ref 250–450)
UIBC: 161 ug/dL

## 2019-01-18 LAB — FERRITIN: Ferritin: 308 ng/mL (ref 24–336)

## 2019-01-18 LAB — POCT HEMOGLOBIN-HEMACUE: Hemoglobin: 10.4 g/dL — ABNORMAL LOW (ref 13.0–17.0)

## 2019-01-18 LAB — MAGNESIUM: Magnesium: 2.5 mg/dL — ABNORMAL HIGH (ref 1.7–2.4)

## 2019-01-18 MED ORDER — EPOETIN ALFA 10000 UNIT/ML IJ SOLN
INTRAMUSCULAR | Status: AC
  Filled 2019-01-18: qty 1

## 2019-01-18 MED ORDER — EPOETIN ALFA 10000 UNIT/ML IJ SOLN
10000.0000 [IU] | INTRAMUSCULAR | Status: DC
  Administered 2019-01-18: 10000 [IU] via SUBCUTANEOUS

## 2019-01-22 DIAGNOSIS — M6281 Muscle weakness (generalized): Secondary | ICD-10-CM | POA: Diagnosis not present

## 2019-01-22 DIAGNOSIS — R262 Difficulty in walking, not elsewhere classified: Secondary | ICD-10-CM | POA: Diagnosis not present

## 2019-01-22 DIAGNOSIS — M25561 Pain in right knee: Secondary | ICD-10-CM | POA: Diagnosis not present

## 2019-01-22 DIAGNOSIS — M25562 Pain in left knee: Secondary | ICD-10-CM | POA: Diagnosis not present

## 2019-01-22 DIAGNOSIS — R2681 Unsteadiness on feet: Secondary | ICD-10-CM | POA: Diagnosis not present

## 2019-01-22 DIAGNOSIS — M25661 Stiffness of right knee, not elsewhere classified: Secondary | ICD-10-CM | POA: Diagnosis not present

## 2019-01-22 DIAGNOSIS — M25662 Stiffness of left knee, not elsewhere classified: Secondary | ICD-10-CM | POA: Diagnosis not present

## 2019-01-24 DIAGNOSIS — D631 Anemia in chronic kidney disease: Secondary | ICD-10-CM | POA: Diagnosis not present

## 2019-01-24 DIAGNOSIS — I120 Hypertensive chronic kidney disease with stage 5 chronic kidney disease or end stage renal disease: Secondary | ICD-10-CM | POA: Diagnosis not present

## 2019-01-24 DIAGNOSIS — N185 Chronic kidney disease, stage 5: Secondary | ICD-10-CM | POA: Diagnosis not present

## 2019-01-24 DIAGNOSIS — N2581 Secondary hyperparathyroidism of renal origin: Secondary | ICD-10-CM | POA: Diagnosis not present

## 2019-01-25 DIAGNOSIS — H52203 Unspecified astigmatism, bilateral: Secondary | ICD-10-CM | POA: Diagnosis not present

## 2019-01-25 DIAGNOSIS — H2513 Age-related nuclear cataract, bilateral: Secondary | ICD-10-CM | POA: Diagnosis not present

## 2019-01-25 DIAGNOSIS — H524 Presbyopia: Secondary | ICD-10-CM | POA: Diagnosis not present

## 2019-01-25 DIAGNOSIS — M25661 Stiffness of right knee, not elsewhere classified: Secondary | ICD-10-CM | POA: Diagnosis not present

## 2019-01-25 DIAGNOSIS — H5201 Hypermetropia, right eye: Secondary | ICD-10-CM | POA: Diagnosis not present

## 2019-01-25 DIAGNOSIS — R262 Difficulty in walking, not elsewhere classified: Secondary | ICD-10-CM | POA: Diagnosis not present

## 2019-01-25 DIAGNOSIS — M25662 Stiffness of left knee, not elsewhere classified: Secondary | ICD-10-CM | POA: Diagnosis not present

## 2019-01-25 DIAGNOSIS — R2681 Unsteadiness on feet: Secondary | ICD-10-CM | POA: Diagnosis not present

## 2019-01-25 DIAGNOSIS — M25562 Pain in left knee: Secondary | ICD-10-CM | POA: Diagnosis not present

## 2019-01-25 DIAGNOSIS — M6281 Muscle weakness (generalized): Secondary | ICD-10-CM | POA: Diagnosis not present

## 2019-01-25 DIAGNOSIS — M25561 Pain in right knee: Secondary | ICD-10-CM | POA: Diagnosis not present

## 2019-01-25 DIAGNOSIS — E119 Type 2 diabetes mellitus without complications: Secondary | ICD-10-CM | POA: Diagnosis not present

## 2019-01-25 LAB — HM DIABETES EYE EXAM

## 2019-01-29 DIAGNOSIS — M25562 Pain in left knee: Secondary | ICD-10-CM | POA: Diagnosis not present

## 2019-01-29 DIAGNOSIS — R262 Difficulty in walking, not elsewhere classified: Secondary | ICD-10-CM | POA: Diagnosis not present

## 2019-01-29 DIAGNOSIS — R2681 Unsteadiness on feet: Secondary | ICD-10-CM | POA: Diagnosis not present

## 2019-01-29 DIAGNOSIS — M25661 Stiffness of right knee, not elsewhere classified: Secondary | ICD-10-CM | POA: Diagnosis not present

## 2019-01-29 DIAGNOSIS — M6281 Muscle weakness (generalized): Secondary | ICD-10-CM | POA: Diagnosis not present

## 2019-01-29 DIAGNOSIS — M25561 Pain in right knee: Secondary | ICD-10-CM | POA: Diagnosis not present

## 2019-01-29 DIAGNOSIS — M25662 Stiffness of left knee, not elsewhere classified: Secondary | ICD-10-CM | POA: Diagnosis not present

## 2019-01-30 ENCOUNTER — Encounter: Payer: Self-pay | Admitting: Sports Medicine

## 2019-01-30 ENCOUNTER — Other Ambulatory Visit: Payer: Self-pay

## 2019-01-30 ENCOUNTER — Ambulatory Visit (INDEPENDENT_AMBULATORY_CARE_PROVIDER_SITE_OTHER): Payer: Medicare HMO | Admitting: Sports Medicine

## 2019-01-30 VITALS — BP 133/69 | Ht 71.0 in | Wt 146.0 lb

## 2019-01-30 DIAGNOSIS — M25562 Pain in left knee: Secondary | ICD-10-CM | POA: Diagnosis not present

## 2019-01-30 DIAGNOSIS — M25561 Pain in right knee: Secondary | ICD-10-CM | POA: Diagnosis not present

## 2019-01-30 DIAGNOSIS — M170 Bilateral primary osteoarthritis of knee: Secondary | ICD-10-CM | POA: Diagnosis not present

## 2019-01-30 DIAGNOSIS — G8929 Other chronic pain: Secondary | ICD-10-CM

## 2019-01-30 NOTE — Progress Notes (Signed)
   [facility] [address] [place], [place] [address] Phone: [phone] Fax: [phone]   Patient Name: Richard Moses Date of Birth: Jul 13, 1932 Medical Record Number: [id number] Gender: male Date of Encounter: 01/30/2019  SUBJECTIVE:      Chief Complaint:  Bilateral knee pain   HPI:  Mr. Kiener comes in today for follow-up of bilateral knee pain secondary to advanced DJD.  7 weeks ago we saw him and referred him for formal physical therapy, which he states did not offer pain relief.  When asked about meeting with a surgeon, he explains his nephew had his knees replaced in [place] and has told him to never get knee surgery because you will regret it.  Not using a cane or walker when ambulating.  Recalls one fall when he tripped over a parking median and needed help getting back up.  He is still having mechanical symptoms intermittently, with locking and catching.  He is wearing his body helix on both knees and that helps when active.  He is using a chairlift at home and has a full-time live-in nursing aide to help him.     ROS:     See HPI.   PERTINENT  PMH / PSH / FH / SH:  Past Medical, Surgical, Social, and Family History Reviewed & Updated in the EMR. Pertinent findings include:  2 ED visits for Foley catheter complications   OBJECTIVE:  BP 133/69   Ht 5\' 11"  (1.803 m)   Wt 146 lb (66.2 kg)   BMI 20.36 kg/m  Physical Exam:  Vital signs are reviewed.   GEN: Alert and oriented, NAD Pulm: Breathing unlabored PSY: normal mood, congruent affect  MSK: Examination of both knees shows full range of motion.  Moderate quad atrophy bilaterally..  Significant bony hypertrophy bilaterally, right worse than left consistent with advanced DJD.  No erythema or effusion.  No soft tissue swelling.  Knees are bilaterally grossly stable ligamentous exam.  NVI distally bilaterally.  ASSESSMENT & PLAN:   1. Bilateral knee pain 2/2 DJD Goals of care were  discussed with patient, his motivating factor is to be pain-free.  Patient is adamant that he still wants to avoid surgery.  Patient is not interested in any kind of injection to the knee. Continue to wear body helix bilaterally.  Counseled on continuing physical therapy and the importance of continuing exercises at home to strengthen area around the knee to offer her chance at any benefit of pain relief.  Gave card for local acupuncturist for possible intervention.   Lanier Clam, DO, ATC Sports Medicine Fellow  Patient seen and evaluated with the sports medicine fellow.  I agree with the above plan of care.  Patient has advanced DJD in both knees.  He does not want to proceed with surgery.  Given his advanced age, I completely understand this.  However, I did explain to him that he will never be pain-free.  I have encouraged him to continue to work on quad strengthening once discharged from home PT.  He does not want cortisone injections as they have not helped him in the past.  I did give him information about a local acupuncturist.  Treatment options are very limited for this patient at this point in time.  Follow-up as needed.

## 2019-02-01 ENCOUNTER — Other Ambulatory Visit: Payer: Self-pay

## 2019-02-01 ENCOUNTER — Ambulatory Visit (HOSPITAL_COMMUNITY)
Admission: RE | Admit: 2019-02-01 | Discharge: 2019-02-01 | Disposition: A | Discharge status: Home / Self Care | Payer: Medicare HMO | Source: Ambulatory Visit | Attending: Nephrology | Admitting: Nephrology

## 2019-02-01 ENCOUNTER — Encounter (HOSPITAL_COMMUNITY): Payer: Self-pay | Admitting: Emergency Medicine

## 2019-02-01 ENCOUNTER — Emergency Department (HOSPITAL_COMMUNITY)
Admission: EM | Admit: 2019-02-01 | Discharge: 2019-02-02 | Disposition: A | Discharge status: Home / Self Care | Payer: Medicare HMO | Attending: Emergency Medicine | Admitting: Emergency Medicine

## 2019-02-01 VITALS — BP 130/75 | HR 84 | Temp 97.1°F | Resp 20

## 2019-02-01 DIAGNOSIS — N185 Chronic kidney disease, stage 5: Secondary | ICD-10-CM | POA: Insufficient documentation

## 2019-02-01 DIAGNOSIS — E1122 Type 2 diabetes mellitus with diabetic chronic kidney disease: Secondary | ICD-10-CM | POA: Diagnosis not present

## 2019-02-01 DIAGNOSIS — Z87891 Personal history of nicotine dependence: Secondary | ICD-10-CM | POA: Diagnosis not present

## 2019-02-01 DIAGNOSIS — T83098A Other mechanical complication of other indwelling urethral catheter, initial encounter: Secondary | ICD-10-CM | POA: Diagnosis not present

## 2019-02-01 DIAGNOSIS — M25562 Pain in left knee: Secondary | ICD-10-CM | POA: Diagnosis not present

## 2019-02-01 DIAGNOSIS — R262 Difficulty in walking, not elsewhere classified: Secondary | ICD-10-CM | POA: Diagnosis not present

## 2019-02-01 DIAGNOSIS — E039 Hypothyroidism, unspecified: Secondary | ICD-10-CM | POA: Insufficient documentation

## 2019-02-01 DIAGNOSIS — Z85828 Personal history of other malignant neoplasm of skin: Secondary | ICD-10-CM | POA: Diagnosis not present

## 2019-02-01 DIAGNOSIS — Y658 Other specified misadventures during surgical and medical care: Secondary | ICD-10-CM | POA: Diagnosis not present

## 2019-02-01 DIAGNOSIS — M25561 Pain in right knee: Secondary | ICD-10-CM | POA: Diagnosis not present

## 2019-02-01 DIAGNOSIS — M25662 Stiffness of left knee, not elsewhere classified: Secondary | ICD-10-CM | POA: Diagnosis not present

## 2019-02-01 DIAGNOSIS — T839XXA Unspecified complication of genitourinary prosthetic device, implant and graft, initial encounter: Secondary | ICD-10-CM | POA: Diagnosis present

## 2019-02-01 DIAGNOSIS — R2681 Unsteadiness on feet: Secondary | ICD-10-CM | POA: Diagnosis not present

## 2019-02-01 DIAGNOSIS — I120 Hypertensive chronic kidney disease with stage 5 chronic kidney disease or end stage renal disease: Secondary | ICD-10-CM | POA: Insufficient documentation

## 2019-02-01 DIAGNOSIS — M6281 Muscle weakness (generalized): Secondary | ICD-10-CM | POA: Diagnosis not present

## 2019-02-01 DIAGNOSIS — M25661 Stiffness of right knee, not elsewhere classified: Secondary | ICD-10-CM | POA: Diagnosis not present

## 2019-02-01 LAB — POCT I-STAT 4, (NA,K, GLUC, HGB,HCT)
Glucose, Bld: 108 mg/dL — ABNORMAL HIGH (ref 70–99)
HCT: 31 % — ABNORMAL LOW (ref 39.0–52.0)
Hemoglobin: 10.5 g/dL — ABNORMAL LOW (ref 13.0–17.0)
Potassium: 3.8 mmol/L (ref 3.5–5.1)
Sodium: 141 mmol/L (ref 135–145)

## 2019-02-01 MED ORDER — EPOETIN ALFA 10000 UNIT/ML IJ SOLN
INTRAMUSCULAR | Status: AC
  Filled 2019-02-01: qty 1

## 2019-02-01 MED ORDER — EPOETIN ALFA 10000 UNIT/ML IJ SOLN
10000.0000 [IU] | INTRAMUSCULAR | Status: DC
  Administered 2019-02-01: 10000 [IU] via SUBCUTANEOUS

## 2019-02-01 NOTE — ED Triage Notes (Signed)
[name], reports clogged foley catheter x1 hour. Reports attempting to flush catheter without success.

## 2019-02-01 NOTE — ED Notes (Addendum)
Bladder scan 144. Pt thinks his last void was around 3pm today.

## 2019-02-02 LAB — URINALYSIS, ROUTINE W REFLEX MICROSCOPIC
Bilirubin Urine: NEGATIVE
Glucose, UA: NEGATIVE mg/dL
Ketones, ur: NEGATIVE mg/dL
Nitrite: POSITIVE — AB
Protein, ur: 100 mg/dL — AB
RBC / HPF: >50 RBC/hpf — ABNORMAL HIGH (ref 0–5)
Specific Gravity, Urine: 1.013 (ref 1.005–1.030)
WBC, UA: >50 WBC/hpf — ABNORMAL HIGH (ref 0–5)
pH: 9 — ABNORMAL HIGH (ref 5.0–8.0)

## 2019-02-02 NOTE — ED Provider Notes (Signed)
Emergency Department Provider Note   I have reviewed the triage vital signs and the nursing notes.   HISTORY  Chief Complaint No chief complaint on file.   HPI Richard Moses is a 85 y.o. male who presents the emergency department today secondary to dysfunctional Foley catheter.  Patient's had a for a while and had a clogged multiple times.  Patient states that around 3:00 last time he noticed any drainage.  He is suprapubic pain since that time.  Presents here for further evaluation.   No other associated or modifying symptoms.    Past Medical History:  Diagnosis Date  . Abnormal MRI, cervical spine 09/29/10   Mildly abnormal MRI cervical spine demonstrating mild spondylosis and disc bulging from C4-5 down to C7-T1.  No spinal stenosis or foraminal narrowing  . Anemia   . Arthritis   . Blood transfusion without reported diagnosis   . Cancer [facility])    possible melanoma on lt.leg  . Cataract    beginning stage  . Cervical disc herniation 09/29/10   C4-C5 and C7-T1  . Chronic kidney disease (CKD)   . Chronic left-sided headaches   . Colon polyp 2007  . Diabetes mellitus   . External hemorrhoids without mention of complication 0630  . GERD (gastroesophageal reflux disease)   . Hyperlipidemia   . Hypertension   . Hypothyroidism 09/29/10   Untreated. Patient not interested in Rx.   . Intermittent self-catheterization of bladder    placed every month  . Knee pain, bilateral 02/05/2013   Pain since a fall in 2014  . MRI of brain abnormal 09/29/10   Abnormal MRI of brain, demonstrating mild atrophy  . Neck pain on left side 2012    Patient Active Problem List   Diagnosis Date Noted  . Secondary hyperparathyroidism ([place]) 03/03/2018  . Memory disturbance 03/03/2017  . Bilateral knee pain 03/03/2017  . Bilateral cataracts 01/17/2017  . Peripheral neuropathy 11/30/2016  . Anemia associated with stage 5 chronic renal failure ([place]) 07/14/2016  . Benign non-nodular prostatic  hyperplasia with lower urinary tract symptoms 07/11/2014  . Bilateral hydronephrosis 04/26/2014  . Incomplete bladder emptying 04/26/2014  . Urinary retention 04/22/2014  . Chronic kidney disease, stage V ([place]) 04/17/2014  . Dysphagia, pharyngoesophageal phase 09/11/2012  . Primary hypertension 12/25/2009  . Elevated PSA 05/20/2009  . Diet-controlled diabetes mellitus ([place]) 02/18/2009    Past Surgical History:  Procedure Laterality Date  . COLONOSCOPY    . MELANOMA EXCISION     left side of neck  . POLYPECTOMY    . RHINOPLASTY     x2     Current Outpatient Rx  . Order #: [id number] Class: Normal  . Order #: [id number] Class: Historical Med  . Order #: [id number] Class: No Print  . Order #: [id number] Class: Normal  . Order #: [id number] Class: Historical Med    Allergies Patient has no known allergies.  Family History  Problem Relation Age of Onset  . Cancer Father 23       Renal CA  . Cancer Sister        Brain   . Cancer Brother        Brain  . Colon cancer Neg Hx   . Stomach cancer Neg Hx   . Colon polyps Neg Hx   . Esophageal cancer Neg Hx   . Rectal cancer Neg Hx     Social History Social History   Tobacco Use  . Smoking status: Former Smoker    Types: Cigarettes  Quit date: 12/06/1966    Years since quitting: 52.1  . Smokeless tobacco: Never Used  Substance Use Topics  . Alcohol use: Yes    Alcohol/week: 0.0 standard drinks    Comment: occasionally - had cut back since diabetes  . Drug use: No    Review of Systems  All other systems negative except as documented in the HPI. All pertinent positives and negatives as reviewed in the HPI. ____________________________________________   PHYSICAL EXAM:  VITAL SIGNS: ED Triage Vitals [02/01/19 2215]  Enc Vitals Group     BP (!) 164/83     Pulse Rate 99     Resp 18     Temp 98.3 F (36.8 C)     Temp Source Oral     SpO2 100 %    Constitutional: Alert and oriented. Well appearing and in no acute  distress. Eyes: Conjunctivae are normal. PERRL. EOMI. Head: Atraumatic. Nose: No congestion/rhinnorhea. Mouth/Throat: Mucous membranes are moist.  Oropharynx non-erythematous. Neck: No stridor.  No meningeal signs.   Cardiovascular: Normal rate, regular rhythm. Good peripheral circulation. Grossly normal heart sounds.   Respiratory: Normal respiratory effort.  No retractions. Lungs CTAB. Gastrointestinal: Soft and nontender. No distention.  Musculoskeletal: No lower extremity tenderness nor edema. No gross deformities of extremities. Neurologic:  Normal speech and language. No gross focal neurologic deficits are appreciated.  Skin:  Skin is warm, dry and intact. No rash noted.   ____________________________________________   LABS (all labs ordered are listed, but only abnormal results are displayed)  Labs Reviewed  URINALYSIS, ROUTINE W REFLEX MICROSCOPIC - Abnormal; Notable for the following components:      Result Value   Color, Urine AMBER (*)    APPearance TURBID (*)    pH 9.0 (*)    Hgb urine dipstick MODERATE (*)    Protein, ur 100 (*)    Nitrite POSITIVE (*)    Leukocytes,Ua LARGE (*)    RBC / HPF >50 (*)    WBC, UA >50 (*)    Bacteria, UA RARE (*)    All other components within normal limits  URINE CULTURE   ____________________________________________  INITIAL IMPRESSION / ASSESSMENT AND PLAN / ED COURSE  Foley catheter removed and replaced.  Functioning now.  Urine sent appears to possibly be infected but secondary to being chronically catheterized we will send a culture and not start antibiotics at this time.     Pertinent labs & imaging results that were available during my care of the patient were reviewed by me and considered in my medical decision making (see chart for details).  A medical screening exam was performed and I feel the patient has had an appropriate workup for their chief complaint at this time and likelihood of emergent condition existing is  low. They have been counseled on decision, discharge, follow up and which symptoms necessitate immediate return to the emergency department. They or their family verbally stated understanding and agreement with plan and discharged in stable condition.   ____________________________________________  FINAL CLINICAL IMPRESSION(S) / ED DIAGNOSES  Final diagnoses:  None     MEDICATIONS GIVEN DURING THIS VISIT:  Medications - No data to display   NEW OUTPATIENT MEDICATIONS STARTED DURING THIS VISIT:  New Prescriptions   No medications on file    Note:  This note was prepared with assistance of Dragon voice recognition software. Occasional wrong-word or sound-a-like substitutions may have occurred due to the inherent limitations of voice recognition software.   Akshar Starnes, Corene Cornea, MD  02/02/19 0117  

## 2019-02-04 LAB — URINE CULTURE

## 2019-02-05 DIAGNOSIS — R262 Difficulty in walking, not elsewhere classified: Secondary | ICD-10-CM | POA: Diagnosis not present

## 2019-02-05 DIAGNOSIS — M6281 Muscle weakness (generalized): Secondary | ICD-10-CM | POA: Diagnosis not present

## 2019-02-05 DIAGNOSIS — M25561 Pain in right knee: Secondary | ICD-10-CM | POA: Diagnosis not present

## 2019-02-05 DIAGNOSIS — R2681 Unsteadiness on feet: Secondary | ICD-10-CM | POA: Diagnosis not present

## 2019-02-05 DIAGNOSIS — M25662 Stiffness of left knee, not elsewhere classified: Secondary | ICD-10-CM | POA: Diagnosis not present

## 2019-02-05 DIAGNOSIS — N202 Calculus of kidney with calculus of ureter: Secondary | ICD-10-CM | POA: Diagnosis not present

## 2019-02-05 DIAGNOSIS — R310 Gross hematuria: Secondary | ICD-10-CM | POA: Diagnosis not present

## 2019-02-05 DIAGNOSIS — M25661 Stiffness of right knee, not elsewhere classified: Secondary | ICD-10-CM | POA: Diagnosis not present

## 2019-02-05 DIAGNOSIS — M25562 Pain in left knee: Secondary | ICD-10-CM | POA: Diagnosis not present

## 2019-02-07 DIAGNOSIS — M25561 Pain in right knee: Secondary | ICD-10-CM | POA: Diagnosis not present

## 2019-02-07 DIAGNOSIS — M25662 Stiffness of left knee, not elsewhere classified: Secondary | ICD-10-CM | POA: Diagnosis not present

## 2019-02-07 DIAGNOSIS — R2681 Unsteadiness on feet: Secondary | ICD-10-CM | POA: Diagnosis not present

## 2019-02-07 DIAGNOSIS — M6281 Muscle weakness (generalized): Secondary | ICD-10-CM | POA: Diagnosis not present

## 2019-02-07 DIAGNOSIS — M25661 Stiffness of right knee, not elsewhere classified: Secondary | ICD-10-CM | POA: Diagnosis not present

## 2019-02-07 DIAGNOSIS — R262 Difficulty in walking, not elsewhere classified: Secondary | ICD-10-CM | POA: Diagnosis not present

## 2019-02-07 DIAGNOSIS — M25562 Pain in left knee: Secondary | ICD-10-CM | POA: Diagnosis not present

## 2019-02-13 ENCOUNTER — Encounter: Payer: Self-pay | Admitting: Family Medicine

## 2019-02-13 DIAGNOSIS — R2681 Unsteadiness on feet: Secondary | ICD-10-CM | POA: Diagnosis not present

## 2019-02-13 DIAGNOSIS — M25561 Pain in right knee: Secondary | ICD-10-CM | POA: Diagnosis not present

## 2019-02-13 DIAGNOSIS — R262 Difficulty in walking, not elsewhere classified: Secondary | ICD-10-CM | POA: Diagnosis not present

## 2019-02-13 DIAGNOSIS — M25661 Stiffness of right knee, not elsewhere classified: Secondary | ICD-10-CM | POA: Diagnosis not present

## 2019-02-13 DIAGNOSIS — M6281 Muscle weakness (generalized): Secondary | ICD-10-CM | POA: Diagnosis not present

## 2019-02-13 DIAGNOSIS — M25662 Stiffness of left knee, not elsewhere classified: Secondary | ICD-10-CM | POA: Diagnosis not present

## 2019-02-13 DIAGNOSIS — M25562 Pain in left knee: Secondary | ICD-10-CM | POA: Diagnosis not present

## 2019-02-15 ENCOUNTER — Ambulatory Visit (HOSPITAL_COMMUNITY)
Admission: RE | Admit: 2019-02-15 | Discharge: 2019-02-15 | Disposition: A | Discharge status: Home / Self Care | Payer: Medicare HMO | Source: Ambulatory Visit | Attending: Nephrology | Admitting: Nephrology

## 2019-02-15 ENCOUNTER — Other Ambulatory Visit: Payer: Self-pay

## 2019-02-15 VITALS — BP 111/65 | HR 88 | Temp 96.7°F | Resp 20

## 2019-02-15 DIAGNOSIS — M25662 Stiffness of left knee, not elsewhere classified: Secondary | ICD-10-CM | POA: Diagnosis not present

## 2019-02-15 DIAGNOSIS — R2681 Unsteadiness on feet: Secondary | ICD-10-CM | POA: Diagnosis not present

## 2019-02-15 DIAGNOSIS — M25661 Stiffness of right knee, not elsewhere classified: Secondary | ICD-10-CM | POA: Diagnosis not present

## 2019-02-15 DIAGNOSIS — M6281 Muscle weakness (generalized): Secondary | ICD-10-CM | POA: Diagnosis not present

## 2019-02-15 DIAGNOSIS — M25561 Pain in right knee: Secondary | ICD-10-CM | POA: Diagnosis not present

## 2019-02-15 DIAGNOSIS — N185 Chronic kidney disease, stage 5: Secondary | ICD-10-CM | POA: Diagnosis not present

## 2019-02-15 DIAGNOSIS — R262 Difficulty in walking, not elsewhere classified: Secondary | ICD-10-CM | POA: Diagnosis not present

## 2019-02-15 DIAGNOSIS — M25562 Pain in left knee: Secondary | ICD-10-CM | POA: Diagnosis not present

## 2019-02-15 LAB — RENAL FUNCTION PANEL
Albumin: 3.6 g/dL (ref 3.5–5.0)
Anion gap: 14 (ref 5–15)
BUN: 64 mg/dL — ABNORMAL HIGH (ref 8–23)
CO2: 22 mmol/L (ref 22–32)
Calcium: 9.5 mg/dL (ref 8.9–10.3)
Chloride: 104 mmol/L (ref 98–111)
Creatinine, Ser: 4.21 mg/dL — ABNORMAL HIGH (ref 0.61–1.24)
GFR calc Af Amer: 14 mL/min — ABNORMAL LOW (ref >60)
GFR calc non Af Amer: 12 mL/min — ABNORMAL LOW (ref >60)
Glucose, Bld: 103 mg/dL — ABNORMAL HIGH (ref 70–99)
Phosphorus: 4.4 mg/dL (ref 2.5–4.6)
Potassium: 4.2 mmol/L (ref 3.5–5.1)
Sodium: 140 mmol/L (ref 135–145)

## 2019-02-15 LAB — FERRITIN: Ferritin: 371 ng/mL — ABNORMAL HIGH (ref 24–336)

## 2019-02-15 LAB — IRON AND TIBC
Iron: 85 ug/dL (ref 45–182)
Saturation Ratios: 32 % (ref 17.9–39.5)
TIBC: 266 ug/dL (ref 250–450)
UIBC: 181 ug/dL

## 2019-02-15 LAB — MAGNESIUM: Magnesium: 3 mg/dL — ABNORMAL HIGH (ref 1.7–2.4)

## 2019-02-15 MED ORDER — EPOETIN ALFA 10000 UNIT/ML IJ SOLN
10000.0000 [IU] | INTRAMUSCULAR | Status: DC
  Administered 2019-02-15: 10:00:00 10000 [IU] via SUBCUTANEOUS

## 2019-02-15 MED ORDER — EPOETIN ALFA 10000 UNIT/ML IJ SOLN
INTRAMUSCULAR | Status: AC
  Administered 2019-02-15: 10000 [IU] via SUBCUTANEOUS
  Filled 2019-02-15: qty 1

## 2019-02-16 LAB — POCT HEMOGLOBIN-HEMACUE: Hemoglobin: 10.7 g/dL — ABNORMAL LOW (ref 13.0–17.0)

## 2019-02-20 ENCOUNTER — Encounter (HOSPITAL_COMMUNITY): Payer: Self-pay | Admitting: Family Medicine

## 2019-02-20 ENCOUNTER — Other Ambulatory Visit: Payer: Self-pay

## 2019-02-20 ENCOUNTER — Emergency Department (HOSPITAL_COMMUNITY)
Admission: EM | Admit: 2019-02-20 | Discharge: 2019-02-21 | Disposition: A | Discharge status: Home / Self Care | Payer: Medicare HMO | Attending: Emergency Medicine | Admitting: Emergency Medicine

## 2019-02-20 DIAGNOSIS — N189 Chronic kidney disease, unspecified: Secondary | ICD-10-CM | POA: Insufficient documentation

## 2019-02-20 DIAGNOSIS — M6281 Muscle weakness (generalized): Secondary | ICD-10-CM | POA: Diagnosis not present

## 2019-02-20 DIAGNOSIS — R2681 Unsteadiness on feet: Secondary | ICD-10-CM | POA: Diagnosis not present

## 2019-02-20 DIAGNOSIS — Y732 Prosthetic and other implants, materials and accessory gastroenterology and urology devices associated with adverse incidents: Secondary | ICD-10-CM | POA: Diagnosis not present

## 2019-02-20 DIAGNOSIS — R262 Difficulty in walking, not elsewhere classified: Secondary | ICD-10-CM | POA: Diagnosis not present

## 2019-02-20 DIAGNOSIS — I129 Hypertensive chronic kidney disease with stage 1 through stage 4 chronic kidney disease, or unspecified chronic kidney disease: Secondary | ICD-10-CM | POA: Diagnosis not present

## 2019-02-20 DIAGNOSIS — M25661 Stiffness of right knee, not elsewhere classified: Secondary | ICD-10-CM | POA: Diagnosis not present

## 2019-02-20 DIAGNOSIS — T83091A Other mechanical complication of indwelling urethral catheter, initial encounter: Secondary | ICD-10-CM | POA: Insufficient documentation

## 2019-02-20 DIAGNOSIS — Z87891 Personal history of nicotine dependence: Secondary | ICD-10-CM | POA: Diagnosis not present

## 2019-02-20 DIAGNOSIS — M25662 Stiffness of left knee, not elsewhere classified: Secondary | ICD-10-CM | POA: Diagnosis not present

## 2019-02-20 DIAGNOSIS — Z79899 Other long term (current) drug therapy: Secondary | ICD-10-CM | POA: Diagnosis not present

## 2019-02-20 DIAGNOSIS — M25561 Pain in right knee: Secondary | ICD-10-CM | POA: Diagnosis not present

## 2019-02-20 DIAGNOSIS — E039 Hypothyroidism, unspecified: Secondary | ICD-10-CM | POA: Diagnosis not present

## 2019-02-20 DIAGNOSIS — E119 Type 2 diabetes mellitus without complications: Secondary | ICD-10-CM | POA: Insufficient documentation

## 2019-02-20 DIAGNOSIS — M25562 Pain in left knee: Secondary | ICD-10-CM | POA: Diagnosis not present

## 2019-02-20 DIAGNOSIS — T83098A Other mechanical complication of other indwelling urethral catheter, initial encounter: Secondary | ICD-10-CM | POA: Diagnosis not present

## 2019-02-20 NOTE — ED Triage Notes (Signed)
Patient has a clogged foley catheter x 2.5 hours. Reports he attempted to flush catheter without success. Patient has a history of this.

## 2019-02-20 NOTE — ED Provider Notes (Signed)
[facility] DEPT Provider Note   CSN: [id number] Arrival date & time: 02/20/19  2215     History   Chief Complaint Chief Complaint  Patient presents with  . Foley Concern    HPI Richard Moses is a 85 y.o. male.     Patient presents to the emergency department with complaints of severe discomfort secondary to a blocked Foley catheter.  He has had the catheter in place for a number of weeks because of urinary retention.  Reports multiple episodes of blockage requiring replacement.  Patient has not had any output through his catheter for the last 4 to 5 hours, complaining of progressively worsening bladder area pain.     Past Medical History:  Diagnosis Date  . Abnormal MRI, cervical spine 09/29/10   Mildly abnormal MRI cervical spine demonstrating mild spondylosis and disc bulging from C4-5 down to C7-T1.  No spinal stenosis or foraminal narrowing  . Anemia   . Arthritis   . Blood transfusion without reported diagnosis   . Cancer [facility])    possible melanoma on lt.leg  . Cataract    beginning stage  . Cervical disc herniation 09/29/10   C4-C5 and C7-T1  . Chronic kidney disease (CKD)   . Chronic left-sided headaches   . Colon polyp 2007  . Diabetes mellitus   . External hemorrhoids without mention of complication 9983  . GERD (gastroesophageal reflux disease)   . Hyperlipidemia   . Hypertension   . Hypothyroidism 09/29/10   Untreated. Patient not interested in Rx.   . Intermittent self-catheterization of bladder    placed every month  . Knee pain, bilateral 02/05/2013   Pain since a fall in 2014  . MRI of brain abnormal 09/29/10   Abnormal MRI of brain, demonstrating mild atrophy  . Neck pain on left side 2012    Patient Active Problem List   Diagnosis Date Noted  . Secondary hyperparathyroidism ([place]) 03/03/2018  . Memory disturbance 03/03/2017  . Bilateral knee pain 03/03/2017  . Bilateral cataracts 01/17/2017  . Peripheral  neuropathy 11/30/2016  . Anemia associated with stage 5 chronic renal failure ([place]) 07/14/2016  . Benign non-nodular prostatic hyperplasia with lower urinary tract symptoms 07/11/2014  . Bilateral hydronephrosis 04/26/2014  . Incomplete bladder emptying 04/26/2014  . Urinary retention 04/22/2014  . Chronic kidney disease, stage V ([place]) 04/17/2014  . Dysphagia, pharyngoesophageal phase 09/11/2012  . Primary hypertension 12/25/2009  . Elevated PSA 05/20/2009  . Diet-controlled diabetes mellitus ([place]) 02/18/2009    Past Surgical History:  Procedure Laterality Date  . COLONOSCOPY    . MELANOMA EXCISION     left side of neck  . POLYPECTOMY    . RHINOPLASTY     x2         Home Medications    Prior to Admission medications   Medication Sig Start Date End Date Taking? Authorizing Provider  Barberry-Oreg Grape-Goldenseal (BERBERINE COMPLEX PO) Take 500 mg by mouth 3 (three) times daily with meals. Reported on 12/11/2015    [provider]  furosemide (LASIX) 20 MG tablet Take 1 tablet (20 mg total) by mouth daily. 09/18/18   Martyn Malay, MD  Multiple Vitamins-Minerals (MENS MULTIVITAMIN PLUS) TABS Take 1 tablet by mouth daily after breakfast.    [provider]    Family History Family History  Problem Relation Age of Onset  . Cancer Father 72       Renal CA  . Cancer Sister  Brain   . Cancer Brother        Brain  . Colon cancer Neg Hx   . Stomach cancer Neg Hx   . Colon polyps Neg Hx   . Esophageal cancer Neg Hx   . Rectal cancer Neg Hx     Social History Social History   Tobacco Use  . Smoking status: Former Smoker    Types: Cigarettes    Quit date: 12/06/1966    Years since quitting: 52.2  . Smokeless tobacco: Never Used  Substance Use Topics  . Alcohol use: Yes    Alcohol/week: 0.0 standard drinks    Comment: occasionally - had cut back since diabetes  . Drug use: No     Allergies   Patient has no known allergies.   Review of  Systems Review of Systems  Genitourinary: Positive for decreased urine volume.  All other systems reviewed and are negative.    Physical Exam Updated Vital Signs BP (!) 178/86 (BP Location: Left Arm)   Pulse (!) 112   Temp 98.1 F (36.7 C) (Oral)   Resp 16   Ht 5\' 11"  (1.803 m)   Wt 68 kg   SpO2 99%   BMI 20.92 kg/m   Physical Exam Vitals signs and nursing note reviewed.  Constitutional:      General: He is not in acute distress.    Appearance: Normal appearance. He is well-developed.  HENT:     Head: Normocephalic and atraumatic.     Right Ear: Hearing normal.     Left Ear: Hearing normal.     Nose: Nose normal.  Eyes:     Conjunctiva/sclera: Conjunctivae normal.     Pupils: Pupils are equal, round, and reactive to light.  Neck:     Musculoskeletal: Normal range of motion and neck supple.  Cardiovascular:     Rate and Rhythm: Regular rhythm.     Heart sounds: S1 normal and S2 normal. No murmur. No friction rub. No gallop.   Pulmonary:     Effort: Pulmonary effort is normal. No respiratory distress.     Breath sounds: Normal breath sounds.  Chest:     Chest wall: No tenderness.  Abdominal:     General: Bowel sounds are normal.     Palpations: Abdomen is soft.     Tenderness: There is abdominal tenderness in the suprapubic area. There is no guarding or rebound. Negative signs include Murphy's sign and McBurney's sign.     Hernia: No hernia is present.  Musculoskeletal: Normal range of motion.  Skin:    General: Skin is warm and dry.     Findings: No rash.  Neurological:     Mental Status: He is alert and oriented to person, place, and time.     GCS: GCS eye subscore is 4. GCS verbal subscore is 5. GCS motor subscore is 6.     Cranial Nerves: No cranial nerve deficit.     Sensory: No sensory deficit.     Coordination: Coordination normal.  Psychiatric:        Speech: Speech normal.        Behavior: Behavior normal.        Thought Content: Thought content  normal.      ED Treatments / Results  Labs (all labs ordered are listed, but only abnormal results are displayed) Labs Reviewed  URINE CULTURE  URINALYSIS, ROUTINE W REFLEX MICROSCOPIC    EKG None  Radiology No results found.  Procedures Procedures (including  critical care time)  Medications Ordered in ED Medications - No data to display   Initial Impression / Assessment and Plan / ED Course  I have reviewed the triage vital signs and the nursing notes.  Pertinent labs & imaging results that were available during my care of the patient were reviewed by me and considered in my medical decision making (see chart for details).        Patient presents with a blocked Foley catheter.  Patient with bladder discomfort secondary to urinary retention.  Foley catheter replaced by nursing staff.  Follow-up with urology.  Final Clinical Impressions(s) / ED Diagnoses   Final diagnoses:  Complication, blocked Foley catheter, initial encounter [facility])    ED Discharge Orders    None       Betsey Holiday Gwenyth Allegra, MD 02/21/19 0000

## 2019-02-21 LAB — URINALYSIS, ROUTINE W REFLEX MICROSCOPIC
Bilirubin Urine: NEGATIVE
Glucose, UA: 50 mg/dL — AB
Ketones, ur: NEGATIVE mg/dL
Nitrite: NEGATIVE
Protein, ur: >=300 mg/dL — AB
RBC / HPF: >50 RBC/hpf — ABNORMAL HIGH (ref 0–5)
Specific Gravity, Urine: 1.016 (ref 1.005–1.030)
pH: 8 (ref 5.0–8.0)

## 2019-02-21 MED ORDER — OXYBUTYNIN CHLORIDE 5 MG PO TABS: 2.5000 mg | ORAL_TABLET | Freq: Three times a day (TID) | ORAL | 0 refills | Status: DC | PRN

## 2019-02-21 MED ORDER — BELLADONNA ALKALOIDS-OPIUM 16.2-60 MG RE SUPP
0.5000 | Freq: Once | RECTAL | Status: AC
  Administered 2019-02-21: 0.5 via RECTAL

## 2019-02-21 NOTE — ED Notes (Signed)
The balloon to existing foley would not deflate, Dr aware and xray ordered

## 2019-02-25 ENCOUNTER — Emergency Department (HOSPITAL_COMMUNITY)
Admission: EM | Admit: 2019-02-25 | Discharge: 2019-02-25 | Disposition: A | Discharge status: Home / Self Care | Payer: Medicare HMO | Attending: Emergency Medicine | Admitting: Emergency Medicine

## 2019-02-25 ENCOUNTER — Other Ambulatory Visit: Payer: Self-pay

## 2019-02-25 ENCOUNTER — Encounter (HOSPITAL_COMMUNITY): Payer: Self-pay

## 2019-02-25 DIAGNOSIS — T83098A Other mechanical complication of other indwelling urethral catheter, initial encounter: Secondary | ICD-10-CM | POA: Diagnosis not present

## 2019-02-25 DIAGNOSIS — E1122 Type 2 diabetes mellitus with diabetic chronic kidney disease: Secondary | ICD-10-CM | POA: Insufficient documentation

## 2019-02-25 DIAGNOSIS — T839XXA Unspecified complication of genitourinary prosthetic device, implant and graft, initial encounter: Secondary | ICD-10-CM

## 2019-02-25 DIAGNOSIS — Z79899 Other long term (current) drug therapy: Secondary | ICD-10-CM | POA: Insufficient documentation

## 2019-02-25 DIAGNOSIS — Z87891 Personal history of nicotine dependence: Secondary | ICD-10-CM | POA: Diagnosis not present

## 2019-02-25 DIAGNOSIS — E039 Hypothyroidism, unspecified: Secondary | ICD-10-CM | POA: Diagnosis not present

## 2019-02-25 DIAGNOSIS — Y69 Unspecified misadventure during surgical and medical care: Secondary | ICD-10-CM | POA: Diagnosis not present

## 2019-02-25 DIAGNOSIS — I129 Hypertensive chronic kidney disease with stage 1 through stage 4 chronic kidney disease, or unspecified chronic kidney disease: Secondary | ICD-10-CM | POA: Insufficient documentation

## 2019-02-25 DIAGNOSIS — R339 Retention of urine, unspecified: Secondary | ICD-10-CM | POA: Insufficient documentation

## 2019-02-25 DIAGNOSIS — N189 Chronic kidney disease, unspecified: Secondary | ICD-10-CM | POA: Diagnosis not present

## 2019-02-25 LAB — URINE CULTURE
Culture: >=100000 — AB
Special Requests: NORMAL

## 2019-02-25 LAB — URINALYSIS, ROUTINE W REFLEX MICROSCOPIC
Bilirubin Urine: NEGATIVE
Glucose, UA: NEGATIVE mg/dL
Ketones, ur: NEGATIVE mg/dL
Nitrite: NEGATIVE
Protein, ur: 100 mg/dL — AB
RBC / HPF: >50 RBC/hpf — ABNORMAL HIGH (ref 0–5)
Specific Gravity, Urine: 1.009 (ref 1.005–1.030)
WBC, UA: >50 WBC/hpf — ABNORMAL HIGH (ref 0–5)
pH: 7 (ref 5.0–8.0)

## 2019-02-25 MED ORDER — LIDOCAINE HCL URETHRAL/MUCOSAL 2 % EX GEL
1.0000 "application " | Freq: Once | CUTANEOUS | Status: DC
  Filled 2019-02-25: qty 5

## 2019-02-25 NOTE — Discharge Instructions (Signed)
Please take your antibiotics as prescribed.

## 2019-02-25 NOTE — ED Triage Notes (Signed)
Pt states that his catheter has been clogged since this afternoon. Unable to flush catheter for relief. Endorsing lower abdominal pressure. Pt has hx of same problem.

## 2019-02-25 NOTE — ED Provider Notes (Signed)
TIME SEEN: 2:18 AM  CHIEF COMPLAINT: Catheter clogged  HPI: Patient is an 85 year old male with history of chronic kidney disease, hypertension, diabetes, hyperlipidemia, BPH who presents to the emergency department who presents to the emergency department with Foley catheter complication.  States that he has a chronic indwelling Foley secondary to BPH.  It was exchanged 2 days ago from a 22 [place] down to a 62 [place] after it became clogged.  He has tried flushing it at home without relief.  States he thinks he needs a larger catheter because he can see debris in the catheter.  He is having a lot of abdominal discomfort.  No vomiting.  Was prescribed antibiotics for urinary tract infection during his last visit which she has not yet started.  ROS: See HPI Constitutional: no fever  Eyes: no drainage  ENT: no runny nose   Cardiovascular:  no chest pain  Resp: no SOB  GI: no vomiting GU: no dysuria Integumentary: no rash  Allergy: no hives  Musculoskeletal: no leg swelling  Neurological: no slurred speech ROS otherwise negative  PAST MEDICAL HISTORY/PAST SURGICAL HISTORY:  Past Medical History:  Diagnosis Date  . Abnormal MRI, cervical spine 09/29/10   Mildly abnormal MRI cervical spine demonstrating mild spondylosis and disc bulging from C4-5 down to C7-T1.  No spinal stenosis or foraminal narrowing  . Anemia   . Arthritis   . Blood transfusion without reported diagnosis   . Cancer [facility])    possible melanoma on lt.leg  . Cataract    beginning stage  . Cervical disc herniation 09/29/10   C4-C5 and C7-T1  . Chronic kidney disease (CKD)   . Chronic left-sided headaches   . Colon polyp 2007  . Diabetes mellitus   . External hemorrhoids without mention of complication 0354  . GERD (gastroesophageal reflux disease)   . Hyperlipidemia   . Hypertension   . Hypothyroidism 09/29/10   Untreated. Patient not interested in Rx.   . Intermittent self-catheterization of bladder    placed  every month  . Knee pain, bilateral 02/05/2013   Pain since a fall in 2014  . MRI of brain abnormal 09/29/10   Abnormal MRI of brain, demonstrating mild atrophy  . Neck pain on left side 2012    MEDICATIONS:  Prior to Admission medications   Medication Sig Start Date End Date Taking? Authorizing Provider  Barberry-Oreg Grape-Goldenseal (BERBERINE COMPLEX PO) Take 500 mg by mouth 3 (three) times daily with meals. Reported on 12/11/2015    [provider]  furosemide (LASIX) 20 MG tablet Take 1 tablet (20 mg total) by mouth daily. 09/18/18   Martyn Malay, MD  Multiple Vitamins-Minerals (MENS MULTIVITAMIN PLUS) TABS Take 1 tablet by mouth daily after breakfast.    [provider]  oxybutynin (DITROPAN) 5 MG tablet Take 0.5 tablets (2.5 mg total) by mouth every 8 (eight) hours as needed for bladder spasms. 02/21/19   Orpah Greek, MD    ALLERGIES:  No Known Allergies  SOCIAL HISTORY:  Social History   Tobacco Use  . Smoking status: Former Smoker    Types: Cigarettes    Quit date: 12/06/1966    Years since quitting: 52.2  . Smokeless tobacco: Never Used  Substance Use Topics  . Alcohol use: Yes    Alcohol/week: 0.0 standard drinks    Comment: occasionally - had cut back since diabetes    FAMILY HISTORY: Family History  Problem Relation Age of Onset  . Cancer Father 75  Renal CA  . Cancer Sister        Brain   . Cancer Brother        Brain  . Colon cancer Neg Hx   . Stomach cancer Neg Hx   . Colon polyps Neg Hx   . Esophageal cancer Neg Hx   . Rectal cancer Neg Hx     EXAM: BP (!) 177/93 (BP Location: Left Arm)   Pulse (!) 136   Temp 99 F (37.2 C) (Oral)   Resp 20   SpO2 100%  CONSTITUTIONAL: Alert and oriented and responds appropriately to questions.  Elderly, appears uncomfortable HEAD: Normocephalic EYES: Conjunctivae clear, pupils appear equal, EOMI ENT: normal nose; moist mucous membranes NECK: Supple, no meningismus, no nuchal  rigidity, no LAD  CARD: Regular and tachycardic; S1 and S2 appreciated; no murmurs, no clicks, no rubs, no gallops RESP: Normal chest excursion without splinting or tachypnea; breath sounds clear and equal bilaterally; no wheezes, no rhonchi, no rales, no hypoxia or respiratory distress, speaking full sentences ABD/GI: Normal bowel sounds; non-distended; soft, non-tender, no rebound, no guarding, no peritoneal signs, no hepatosplenomegaly BACK:  The back appears normal and is non-tender to palpation, there is no CVA tenderness EXT: Normal ROM in all joints; non-tender to palpation; no edema; normal capillary refill; no cyanosis, no calf tenderness or swelling    SKIN: Normal color for age and race; warm; no rash NEURO: Moves all extremities equally PSYCH: The patient's mood and manner are appropriate. Grooming and personal hygiene are appropriate.  MEDICAL DECISION MAKING: Patient here with urinary retention secondary to bladder obstruction from his chronic indwelling Foley catheter.  Attempted to irrigate without relief.  Catheter exchange with a 20 [place] coud.  He drained over 400 mL and is feeling much better.  Urine appears infected today.  He states he was prescribed antibiotics 2 days ago.  Have advised him to take these medications and follow-up with his urologist.  Discussed return precautions with patient and son.  They are comfortable with this plan.  ED PROGRESS: Patient's heart rate has significantly improved now that his pain has improved and he has been able to drain his bladder appropriately.  Patient and son comfortable with plan for discharge home.  At this time, I do not feel there is any life-threatening condition present. I have reviewed and discussed all results (EKG, imaging, lab, urine as appropriate) and exam findings with patient/family. I have reviewed nursing notes and appropriate previous records.  I feel the patient is safe to be discharged home without further emergent  workup and can continue workup as an outpatient as needed. Discussed usual and customary return precautions. Patient/family verbalize understanding and are comfortable with this plan.  Outpatient follow-up has been provided as needed. All questions have been answered.      Kinley Ferrentino, Delice Bison, DO 02/25/19 0730

## 2019-02-26 ENCOUNTER — Telehealth: Payer: Self-pay | Admitting: *Deleted

## 2019-02-26 NOTE — Telephone Encounter (Signed)
Post ED Visit - Positive Culture Follow-up  Culture report reviewed by antimicrobial stewardship pharmacist: [place] Team []  Elenor Quinones, Pharm.D. []  Heide Guile, Pharm.D., BCPS AQ-ID []  Parks Neptune, Pharm.D., BCPS []  Alycia Rossetti, Pharm.D., BCPS []  [place]oks, Pharm.D., BCPS, AAHIVP []  Legrand Como, Pharm.D., BCPS, AAHIVP []  Salome Arnt, PharmD, BCPS []  Johnnette Gourd, PharmD, BCPS []  Hughes Better, PharmD, BCPS []  Leeroy Cha, PharmD []  Laqueta Linden, PharmD, BCPS []  Albertina Parr, PharmD  [place] Team []  Leodis Sias, PharmD []  Lindell Spar, PharmD []  Royetta Asal, PharmD []  Graylin Shiver, Rph []  Rema Fendt) Glennon Mac, PharmD []  Arlyn Dunning, PharmD []  Netta Cedars, PharmD []  Dia Sitter, PharmD []  Leone Haven, PharmD []  Gretta Arab, PharmD []  Theodis Shove, PharmD []  Peggyann Juba, PharmD []  Reuel Boom, PharmD   Positive urine culture, reviewed by Suella Broad, PA-C Patient is asymptomatic at present with Urology appt on 03/09/2019 and no further patient follow-up is required at this time.  Harlon Flor [facility] 02/26/2019, 11:03 AM

## 2019-02-27 LAB — URINE CULTURE: Culture: >=100000 — AB

## 2019-02-28 ENCOUNTER — Telehealth: Payer: Self-pay | Admitting: *Deleted

## 2019-02-28 NOTE — Telephone Encounter (Signed)
Post ED Visit - Positive Culture Follow-up  Culture report reviewed by antimicrobial stewardship pharmacist: [place] Team []  Elenor Quinones, Pharm.D. []  Heide Guile, Pharm.D., BCPS AQ-ID []  Parks Neptune, Pharm.D., BCPS []  Alycia Rossetti, Pharm.D., BCPS []  [place], Pharm.D., BCPS, AAHIVP []  Legrand Como, Pharm.D., BCPS, AAHIVP []  Salome Arnt, PharmD, BCPS []  Johnnette Gourd, PharmD, BCPS []  Hughes Better, PharmD, BCPS []  Leeroy Cha, PharmD []  Laqueta Linden, PharmD, BCPS []  Albertina Parr, PharmD  [place] Team []  Leodis Sias, PharmD []  Lindell Spar, PharmD []  Royetta Asal, PharmD []  Graylin Shiver, Rph []  Rema Fendt) Glennon Mac, PharmD []  Arlyn Dunning, PharmD []  Netta Cedars, PharmD [x]  Dia Sitter, PharmD []  Leone Haven, PharmD []  Gretta Arab, PharmD []  Theodis Shove, PharmD []  Peggyann Juba, PharmD []  Reuel Boom, PharmD   Positive urine culture Patient is asymptomatic and no further patient follow-up is required at this time.  Harlon Flor [facility] 02/28/2019, 8:30 AM

## 2019-03-01 ENCOUNTER — Other Ambulatory Visit: Payer: Self-pay

## 2019-03-01 ENCOUNTER — Ambulatory Visit (HOSPITAL_COMMUNITY)
Admission: RE | Admit: 2019-03-01 | Discharge: 2019-03-01 | Disposition: A | Discharge status: Home / Self Care | Payer: Medicare HMO | Source: Ambulatory Visit | Attending: Nephrology | Admitting: Nephrology

## 2019-03-01 VITALS — BP 130/75 | HR 87 | Temp 96.5°F | Resp 20

## 2019-03-01 DIAGNOSIS — M25561 Pain in right knee: Secondary | ICD-10-CM | POA: Diagnosis not present

## 2019-03-01 DIAGNOSIS — R262 Difficulty in walking, not elsewhere classified: Secondary | ICD-10-CM | POA: Diagnosis not present

## 2019-03-01 DIAGNOSIS — M25661 Stiffness of right knee, not elsewhere classified: Secondary | ICD-10-CM | POA: Diagnosis not present

## 2019-03-01 DIAGNOSIS — M6281 Muscle weakness (generalized): Secondary | ICD-10-CM | POA: Diagnosis not present

## 2019-03-01 DIAGNOSIS — R2681 Unsteadiness on feet: Secondary | ICD-10-CM | POA: Diagnosis not present

## 2019-03-01 DIAGNOSIS — N185 Chronic kidney disease, stage 5: Secondary | ICD-10-CM | POA: Diagnosis not present

## 2019-03-01 DIAGNOSIS — M25562 Pain in left knee: Secondary | ICD-10-CM | POA: Diagnosis not present

## 2019-03-01 DIAGNOSIS — M25662 Stiffness of left knee, not elsewhere classified: Secondary | ICD-10-CM | POA: Diagnosis not present

## 2019-03-01 LAB — POCT HEMOGLOBIN-HEMACUE: Hemoglobin: 9.6 g/dL — ABNORMAL LOW (ref 13.0–17.0)

## 2019-03-01 MED ORDER — EPOETIN ALFA 10000 UNIT/ML IJ SOLN: 10000.0000 [IU] | INTRAMUSCULAR | Status: DC

## 2019-03-01 MED ORDER — EPOETIN ALFA 10000 UNIT/ML IJ SOLN
INTRAMUSCULAR | Status: AC
  Administered 2019-03-01: 10000 [IU]
  Filled 2019-03-01: qty 1

## 2019-03-02 ENCOUNTER — Other Ambulatory Visit: Payer: Self-pay

## 2019-03-02 ENCOUNTER — Ambulatory Visit (INDEPENDENT_AMBULATORY_CARE_PROVIDER_SITE_OTHER): Payer: Medicare HMO | Admitting: Family Medicine

## 2019-03-02 VITALS — BP 115/70 | Wt 150.8 lb

## 2019-03-02 DIAGNOSIS — Z23 Encounter for immunization: Secondary | ICD-10-CM

## 2019-03-02 DIAGNOSIS — Z0000 Encounter for general adult medical examination without abnormal findings: Secondary | ICD-10-CM

## 2019-03-02 NOTE — Patient Instructions (Signed)
It was a pleasure to meet you today.  You will will receive the flu vaccine today.  If your insurance company requires paper chart of your annual physical please let me know.  You do not need a hepatitis vaccine at this time.   Continue your current activity and diet.     Follow-up with me as needed  Carollee Leitz, MD

## 2019-03-04 ENCOUNTER — Encounter: Payer: Self-pay | Admitting: Family Medicine

## 2019-03-04 NOTE — Progress Notes (Signed)
  Patient Name: Richard Moses Date of Birth: 07-24-1932 Date of Visit: 03/04/19 PCP: Carollee Leitz, MD  Chief Complaint:   Subjective: DMARI SCHUBRING is a pleasant 85 y.o. with medical history significant for diet-controlled diabetes, BPH, hypertension presenting today for medical exam.   Patient reports that his insurance is requiring an annual visit.  He denies any chest pain, shortness of breath, abdominal pain, nausea vomiting, diarrhea or constipation.  He currently has an indwelling catheter for enlarged prostate.  The only concern is that periodically gets blocked for which she goes to the ED and has it changed monthly.  Otherwise he states he is in good health and has no concerns presently.  ROS: Per HPI.   I have reviewed the patient's medical, surgical, family, and social history as appropriate.  Vitals:   03/02/19 1435  BP: 115/70  SpO2: 99%    General: Well-appearing Eyes: PEERLA, right eye ptosis ENTM: No pharyengeal erythema, mucous membranes moist Neck: nontender Cardiovascular: RRR with no murmurs noted Respiratory: CTA bilaterally  Gastrointestinal: Bowel sounds present. No abdominal pain MSK: Upper extremity strength 5/5 bilaterally, Lower extremity strength 5/5 bilaterally  Derm: No rashes noted Neuro: Alert and orientated x3 Psych: Behavior and speech appropriate to situation  Health maintenance -Annual physical complete. -Flu vaccine given. -Continue current diet and activity   Return to care as needed  Carollee Leitz, MD  [facility] Medicine Teaching Service

## 2019-03-05 DIAGNOSIS — M25662 Stiffness of left knee, not elsewhere classified: Secondary | ICD-10-CM | POA: Diagnosis not present

## 2019-03-05 DIAGNOSIS — M6281 Muscle weakness (generalized): Secondary | ICD-10-CM | POA: Diagnosis not present

## 2019-03-05 DIAGNOSIS — R262 Difficulty in walking, not elsewhere classified: Secondary | ICD-10-CM | POA: Diagnosis not present

## 2019-03-05 DIAGNOSIS — M25561 Pain in right knee: Secondary | ICD-10-CM | POA: Diagnosis not present

## 2019-03-05 DIAGNOSIS — M25562 Pain in left knee: Secondary | ICD-10-CM | POA: Diagnosis not present

## 2019-03-05 DIAGNOSIS — M25661 Stiffness of right knee, not elsewhere classified: Secondary | ICD-10-CM | POA: Diagnosis not present

## 2019-03-05 DIAGNOSIS — R2681 Unsteadiness on feet: Secondary | ICD-10-CM | POA: Diagnosis not present

## 2019-03-07 DIAGNOSIS — M25561 Pain in right knee: Secondary | ICD-10-CM | POA: Diagnosis not present

## 2019-03-07 DIAGNOSIS — M6281 Muscle weakness (generalized): Secondary | ICD-10-CM | POA: Diagnosis not present

## 2019-03-07 DIAGNOSIS — M25562 Pain in left knee: Secondary | ICD-10-CM | POA: Diagnosis not present

## 2019-03-07 DIAGNOSIS — M25662 Stiffness of left knee, not elsewhere classified: Secondary | ICD-10-CM | POA: Diagnosis not present

## 2019-03-07 DIAGNOSIS — R2681 Unsteadiness on feet: Secondary | ICD-10-CM | POA: Diagnosis not present

## 2019-03-07 DIAGNOSIS — M25661 Stiffness of right knee, not elsewhere classified: Secondary | ICD-10-CM | POA: Diagnosis not present

## 2019-03-07 DIAGNOSIS — R262 Difficulty in walking, not elsewhere classified: Secondary | ICD-10-CM | POA: Diagnosis not present

## 2019-03-09 DIAGNOSIS — N401 Enlarged prostate with lower urinary tract symptoms: Secondary | ICD-10-CM | POA: Diagnosis not present

## 2019-03-09 DIAGNOSIS — R310 Gross hematuria: Secondary | ICD-10-CM | POA: Diagnosis not present

## 2019-03-12 DIAGNOSIS — M25562 Pain in left knee: Secondary | ICD-10-CM | POA: Diagnosis not present

## 2019-03-12 DIAGNOSIS — R262 Difficulty in walking, not elsewhere classified: Secondary | ICD-10-CM | POA: Diagnosis not present

## 2019-03-12 DIAGNOSIS — M6281 Muscle weakness (generalized): Secondary | ICD-10-CM | POA: Diagnosis not present

## 2019-03-12 DIAGNOSIS — M25661 Stiffness of right knee, not elsewhere classified: Secondary | ICD-10-CM | POA: Diagnosis not present

## 2019-03-12 DIAGNOSIS — R2681 Unsteadiness on feet: Secondary | ICD-10-CM | POA: Diagnosis not present

## 2019-03-12 DIAGNOSIS — M25561 Pain in right knee: Secondary | ICD-10-CM | POA: Diagnosis not present

## 2019-03-12 DIAGNOSIS — M25662 Stiffness of left knee, not elsewhere classified: Secondary | ICD-10-CM | POA: Diagnosis not present

## 2019-03-15 ENCOUNTER — Other Ambulatory Visit: Payer: Self-pay

## 2019-03-15 ENCOUNTER — Encounter (HOSPITAL_COMMUNITY)
Admission: RE | Admit: 2019-03-15 | Discharge: 2019-03-15 | Disposition: A | Discharge status: Home / Self Care | Payer: Medicare HMO | Source: Ambulatory Visit | Attending: Nephrology | Admitting: Nephrology

## 2019-03-15 VITALS — BP 141/78 | HR 100 | Temp 96.3°F | Resp 20

## 2019-03-15 DIAGNOSIS — M6281 Muscle weakness (generalized): Secondary | ICD-10-CM | POA: Diagnosis not present

## 2019-03-15 DIAGNOSIS — M25661 Stiffness of right knee, not elsewhere classified: Secondary | ICD-10-CM | POA: Diagnosis not present

## 2019-03-15 DIAGNOSIS — M25561 Pain in right knee: Secondary | ICD-10-CM | POA: Diagnosis not present

## 2019-03-15 DIAGNOSIS — M25562 Pain in left knee: Secondary | ICD-10-CM | POA: Diagnosis not present

## 2019-03-15 DIAGNOSIS — M25662 Stiffness of left knee, not elsewhere classified: Secondary | ICD-10-CM | POA: Diagnosis not present

## 2019-03-15 DIAGNOSIS — N185 Chronic kidney disease, stage 5: Secondary | ICD-10-CM | POA: Diagnosis present

## 2019-03-15 DIAGNOSIS — R2681 Unsteadiness on feet: Secondary | ICD-10-CM | POA: Diagnosis not present

## 2019-03-15 DIAGNOSIS — R262 Difficulty in walking, not elsewhere classified: Secondary | ICD-10-CM | POA: Diagnosis not present

## 2019-03-15 LAB — RENAL FUNCTION PANEL
Albumin: 3.5 g/dL (ref 3.5–5.0)
Anion gap: 11 (ref 5–15)
BUN: 50 mg/dL — ABNORMAL HIGH (ref 8–23)
CO2: 21 mmol/L — ABNORMAL LOW (ref 22–32)
Calcium: 9.9 mg/dL (ref 8.9–10.3)
Chloride: 108 mmol/L (ref 98–111)
Creatinine, Ser: 4.28 mg/dL — ABNORMAL HIGH (ref 0.61–1.24)
GFR calc Af Amer: 14 mL/min — ABNORMAL LOW (ref >60)
GFR calc non Af Amer: 12 mL/min — ABNORMAL LOW (ref >60)
Glucose, Bld: 105 mg/dL — ABNORMAL HIGH (ref 70–99)
Phosphorus: 4.3 mg/dL (ref 2.5–4.6)
Potassium: 3.7 mmol/L (ref 3.5–5.1)
Sodium: 140 mmol/L (ref 135–145)

## 2019-03-15 LAB — IRON AND TIBC
Iron: 67 ug/dL (ref 45–182)
Saturation Ratios: 26 % (ref 17.9–39.5)
TIBC: 256 ug/dL (ref 250–450)
UIBC: 189 ug/dL

## 2019-03-15 LAB — FERRITIN: Ferritin: 300 ng/mL (ref 24–336)

## 2019-03-15 LAB — POCT HEMOGLOBIN-HEMACUE: Hemoglobin: 10.7 g/dL — ABNORMAL LOW (ref 13.0–17.0)

## 2019-03-15 MED ORDER — EPOETIN ALFA 10000 UNIT/ML IJ SOLN
INTRAMUSCULAR | Status: AC
  Administered 2019-03-15: 10000 [IU] via SUBCUTANEOUS
  Filled 2019-03-15: qty 1

## 2019-03-15 MED ORDER — EPOETIN ALFA 10000 UNIT/ML IJ SOLN
10000.0000 [IU] | INTRAMUSCULAR | Status: DC
  Administered 2019-03-15: 08:00:00 10000 [IU] via SUBCUTANEOUS

## 2019-03-16 LAB — PTH, INTACT AND CALCIUM
Calcium, Total (PTH): 10.1 mg/dL (ref 8.6–10.2)
PTH: 11 pg/mL — ABNORMAL LOW (ref 15–65)

## 2019-03-20 DIAGNOSIS — M25662 Stiffness of left knee, not elsewhere classified: Secondary | ICD-10-CM | POA: Diagnosis not present

## 2019-03-20 DIAGNOSIS — R262 Difficulty in walking, not elsewhere classified: Secondary | ICD-10-CM | POA: Diagnosis not present

## 2019-03-20 DIAGNOSIS — R2681 Unsteadiness on feet: Secondary | ICD-10-CM | POA: Diagnosis not present

## 2019-03-20 DIAGNOSIS — M25562 Pain in left knee: Secondary | ICD-10-CM | POA: Diagnosis not present

## 2019-03-20 DIAGNOSIS — M25561 Pain in right knee: Secondary | ICD-10-CM | POA: Diagnosis not present

## 2019-03-20 DIAGNOSIS — M25661 Stiffness of right knee, not elsewhere classified: Secondary | ICD-10-CM | POA: Diagnosis not present

## 2019-03-20 DIAGNOSIS — M6281 Muscle weakness (generalized): Secondary | ICD-10-CM | POA: Diagnosis not present

## 2019-03-29 ENCOUNTER — Ambulatory Visit (HOSPITAL_COMMUNITY)
Admission: RE | Admit: 2019-03-29 | Discharge: 2019-03-29 | Disposition: A | Discharge status: Home / Self Care | Payer: Medicare HMO | Source: Ambulatory Visit | Attending: Nephrology | Admitting: Nephrology

## 2019-03-29 ENCOUNTER — Other Ambulatory Visit: Payer: Self-pay

## 2019-03-29 VITALS — BP 140/71 | HR 80 | Temp 94.7°F | Resp 20

## 2019-03-29 DIAGNOSIS — N185 Chronic kidney disease, stage 5: Secondary | ICD-10-CM | POA: Insufficient documentation

## 2019-03-29 LAB — POCT HEMOGLOBIN-HEMACUE: Hemoglobin: 11.3 g/dL — ABNORMAL LOW (ref 13.0–17.0)

## 2019-03-29 MED ORDER — EPOETIN ALFA 10000 UNIT/ML IJ SOLN
10000.0000 [IU] | INTRAMUSCULAR | Status: DC
  Administered 2019-03-29: 08:00:00 10000 [IU] via SUBCUTANEOUS

## 2019-03-29 MED ORDER — EPOETIN ALFA 10000 UNIT/ML IJ SOLN
INTRAMUSCULAR | Status: AC
  Administered 2019-03-29: 08:00:00 10000 [IU] via SUBCUTANEOUS
  Filled 2019-03-29: qty 1

## 2019-04-10 DIAGNOSIS — R3914 Feeling of incomplete bladder emptying: Secondary | ICD-10-CM | POA: Diagnosis not present

## 2019-04-12 ENCOUNTER — Other Ambulatory Visit: Payer: Self-pay

## 2019-04-12 ENCOUNTER — Ambulatory Visit (HOSPITAL_COMMUNITY)
Admission: RE | Admit: 2019-04-12 | Discharge: 2019-04-12 | Disposition: A | Discharge status: Home / Self Care | Payer: Medicare HMO | Source: Ambulatory Visit | Attending: Nephrology | Admitting: Nephrology

## 2019-04-12 VITALS — BP 131/70 | HR 73 | Temp 95.6°F | Resp 20

## 2019-04-12 DIAGNOSIS — N185 Chronic kidney disease, stage 5: Secondary | ICD-10-CM | POA: Insufficient documentation

## 2019-04-12 LAB — RENAL FUNCTION PANEL
Albumin: 3.5 g/dL (ref 3.5–5.0)
Anion gap: 12 (ref 5–15)
BUN: 64 mg/dL — ABNORMAL HIGH (ref 8–23)
CO2: 19 mmol/L — ABNORMAL LOW (ref 22–32)
Calcium: 9.6 mg/dL (ref 8.9–10.3)
Chloride: 109 mmol/L (ref 98–111)
Creatinine, Ser: 4.24 mg/dL — ABNORMAL HIGH (ref 0.61–1.24)
GFR calc Af Amer: 14 mL/min — ABNORMAL LOW (ref >60)
GFR calc non Af Amer: 12 mL/min — ABNORMAL LOW (ref >60)
Glucose, Bld: 108 mg/dL — ABNORMAL HIGH (ref 70–99)
Phosphorus: 4.7 mg/dL — ABNORMAL HIGH (ref 2.5–4.6)
Potassium: 3.9 mmol/L (ref 3.5–5.1)
Sodium: 140 mmol/L (ref 135–145)

## 2019-04-12 LAB — IRON AND TIBC
Iron: 92 ug/dL (ref 45–182)
Saturation Ratios: 35 % (ref 17.9–39.5)
TIBC: 260 ug/dL (ref 250–450)
UIBC: 168 ug/dL

## 2019-04-12 LAB — POCT HEMOGLOBIN-HEMACUE: Hemoglobin: 10.6 g/dL — ABNORMAL LOW (ref 13.0–17.0)

## 2019-04-12 LAB — FERRITIN: Ferritin: 249 ng/mL (ref 24–336)

## 2019-04-12 MED ORDER — EPOETIN ALFA 10000 UNIT/ML IJ SOLN
INTRAMUSCULAR | Status: AC
  Filled 2019-04-12: qty 1

## 2019-04-12 MED ORDER — EPOETIN ALFA 10000 UNIT/ML IJ SOLN
10000.0000 [IU] | INTRAMUSCULAR | Status: DC
  Administered 2019-04-12: 10000 [IU] via SUBCUTANEOUS

## 2019-04-13 LAB — PTH, INTACT AND CALCIUM
Calcium, Total (PTH): 9.3 mg/dL (ref 8.6–10.2)
PTH: 14 pg/mL — ABNORMAL LOW (ref 15–65)

## 2019-04-26 ENCOUNTER — Other Ambulatory Visit: Payer: Self-pay

## 2019-04-26 ENCOUNTER — Ambulatory Visit (HOSPITAL_COMMUNITY)
Admission: RE | Admit: 2019-04-26 | Discharge: 2019-04-26 | Disposition: A | Discharge status: Home / Self Care | Payer: Medicare HMO | Source: Ambulatory Visit | Attending: Nephrology | Admitting: Nephrology

## 2019-04-26 VITALS — BP 141/81 | HR 87 | Temp 96.6°F | Resp 20

## 2019-04-26 DIAGNOSIS — N185 Chronic kidney disease, stage 5: Secondary | ICD-10-CM | POA: Diagnosis present

## 2019-04-26 LAB — POCT HEMOGLOBIN-HEMACUE: Hemoglobin: 10.8 g/dL — ABNORMAL LOW (ref 13.0–17.0)

## 2019-04-26 MED ORDER — EPOETIN ALFA 10000 UNIT/ML IJ SOLN
10000.0000 [IU] | INTRAMUSCULAR | Status: DC
  Administered 2019-04-26: 08:00:00 10000 [IU] via SUBCUTANEOUS

## 2019-04-26 MED ORDER — EPOETIN ALFA 10000 UNIT/ML IJ SOLN
INTRAMUSCULAR | Status: AC
  Administered 2019-04-26: 10000 [IU] via SUBCUTANEOUS
  Filled 2019-04-26: qty 1

## 2019-05-10 ENCOUNTER — Other Ambulatory Visit: Payer: Self-pay

## 2019-05-10 ENCOUNTER — Ambulatory Visit (HOSPITAL_COMMUNITY)
Admission: RE | Admit: 2019-05-10 | Discharge: 2019-05-10 | Disposition: A | Discharge status: Home / Self Care | Payer: Medicare HMO | Source: Ambulatory Visit | Attending: Nephrology | Admitting: Nephrology

## 2019-05-10 VITALS — BP 135/82 | HR 92 | Temp 98.4°F | Resp 20

## 2019-05-10 DIAGNOSIS — N185 Chronic kidney disease, stage 5: Secondary | ICD-10-CM | POA: Diagnosis present

## 2019-05-10 LAB — RENAL FUNCTION PANEL
Albumin: 3.6 g/dL (ref 3.5–5.0)
Anion gap: 13 (ref 5–15)
BUN: 57 mg/dL — ABNORMAL HIGH (ref 8–23)
CO2: 21 mmol/L — ABNORMAL LOW (ref 22–32)
Calcium: 9.9 mg/dL (ref 8.9–10.3)
Chloride: 108 mmol/L (ref 98–111)
Creatinine, Ser: 4.4 mg/dL — ABNORMAL HIGH (ref 0.61–1.24)
GFR calc Af Amer: 13 mL/min — ABNORMAL LOW (ref >60)
GFR calc non Af Amer: 11 mL/min — ABNORMAL LOW (ref >60)
Glucose, Bld: 112 mg/dL — ABNORMAL HIGH (ref 70–99)
Phosphorus: 4.3 mg/dL (ref 2.5–4.6)
Potassium: 3.7 mmol/L (ref 3.5–5.1)
Sodium: 142 mmol/L (ref 135–145)

## 2019-05-10 LAB — IRON AND TIBC
Iron: 78 ug/dL (ref 45–182)
Saturation Ratios: 30 % (ref 17.9–39.5)
TIBC: 258 ug/dL (ref 250–450)
UIBC: 180 ug/dL

## 2019-05-10 LAB — FERRITIN: Ferritin: 255 ng/mL (ref 24–336)

## 2019-05-10 MED ORDER — EPOETIN ALFA 10000 UNIT/ML IJ SOLN
10000.0000 [IU] | INTRAMUSCULAR | Status: DC
  Administered 2019-05-10: 10000 [IU] via SUBCUTANEOUS

## 2019-05-10 MED ORDER — EPOETIN ALFA 10000 UNIT/ML IJ SOLN
INTRAMUSCULAR | Status: AC
  Filled 2019-05-10: qty 1

## 2019-05-10 NOTE — Progress Notes (Signed)
Used the wrong green top tube and got a skewed potassium result even after a repeat Istat. Reported to Dr Posey Pronto.

## 2019-05-11 LAB — PTH, INTACT AND CALCIUM
Calcium, Total (PTH): 9.9 mg/dL (ref 8.6–10.2)
PTH: 12 pg/mL — ABNORMAL LOW (ref 15–65)

## 2019-05-23 DIAGNOSIS — R3914 Feeling of incomplete bladder emptying: Secondary | ICD-10-CM | POA: Diagnosis not present

## 2019-05-24 ENCOUNTER — Encounter (HOSPITAL_COMMUNITY)
Admission: RE | Admit: 2019-05-24 | Discharge: 2019-05-24 | Disposition: A | Discharge status: Home / Self Care | Payer: Medicare HMO | Source: Ambulatory Visit | Attending: Nephrology | Admitting: Nephrology

## 2019-05-24 ENCOUNTER — Other Ambulatory Visit: Payer: Self-pay

## 2019-05-24 VITALS — BP 123/67 | HR 91 | Temp 95.7°F | Resp 20

## 2019-05-24 DIAGNOSIS — N185 Chronic kidney disease, stage 5: Secondary | ICD-10-CM | POA: Insufficient documentation

## 2019-05-24 LAB — POCT HEMOGLOBIN-HEMACUE: Hemoglobin: 11 g/dL — ABNORMAL LOW (ref 13.0–17.0)

## 2019-05-24 MED ORDER — EPOETIN ALFA 10000 UNIT/ML IJ SOLN
INTRAMUSCULAR | Status: AC
  Filled 2019-05-24: qty 1

## 2019-05-24 MED ORDER — EPOETIN ALFA 10000 UNIT/ML IJ SOLN
10000.0000 [IU] | INTRAMUSCULAR | Status: DC
  Administered 2019-05-24: 10000 [IU] via SUBCUTANEOUS

## 2019-05-30 DIAGNOSIS — Z825 Family history of asthma and other chronic lower respiratory diseases: Secondary | ICD-10-CM | POA: Diagnosis not present

## 2019-05-30 DIAGNOSIS — N529 Male erectile dysfunction, unspecified: Secondary | ICD-10-CM | POA: Diagnosis not present

## 2019-05-30 DIAGNOSIS — M1990 Unspecified osteoarthritis, unspecified site: Secondary | ICD-10-CM | POA: Diagnosis not present

## 2019-05-30 DIAGNOSIS — I739 Peripheral vascular disease, unspecified: Secondary | ICD-10-CM | POA: Diagnosis not present

## 2019-05-30 DIAGNOSIS — Z85828 Personal history of other malignant neoplasm of skin: Secondary | ICD-10-CM | POA: Diagnosis not present

## 2019-05-30 DIAGNOSIS — Z87891 Personal history of nicotine dependence: Secondary | ICD-10-CM | POA: Diagnosis not present

## 2019-05-30 DIAGNOSIS — Z809 Family history of malignant neoplasm, unspecified: Secondary | ICD-10-CM | POA: Diagnosis not present

## 2019-05-30 DIAGNOSIS — Z008 Encounter for other general examination: Secondary | ICD-10-CM | POA: Diagnosis not present

## 2019-05-30 DIAGNOSIS — Z833 Family history of diabetes mellitus: Secondary | ICD-10-CM | POA: Diagnosis not present

## 2019-06-07 ENCOUNTER — Ambulatory Visit (HOSPITAL_COMMUNITY)
Admission: RE | Admit: 2019-06-07 | Discharge: 2019-06-07 | Disposition: A | Discharge status: Home / Self Care | Payer: Medicare HMO | Source: Ambulatory Visit | Attending: Nephrology | Admitting: Nephrology

## 2019-06-07 ENCOUNTER — Other Ambulatory Visit: Payer: Self-pay

## 2019-06-07 VITALS — BP 135/70 | HR 81 | Temp 96.0°F | Resp 20

## 2019-06-07 DIAGNOSIS — N185 Chronic kidney disease, stage 5: Secondary | ICD-10-CM | POA: Insufficient documentation

## 2019-06-07 LAB — POCT HEMOGLOBIN-HEMACUE: Hemoglobin: 10.6 g/dL — ABNORMAL LOW (ref 13.0–17.0)

## 2019-06-07 MED ORDER — EPOETIN ALFA 10000 UNIT/ML IJ SOLN
10000.0000 [IU] | INTRAMUSCULAR | Status: DC
  Administered 2019-06-07: 10000 [IU] via SUBCUTANEOUS

## 2019-06-07 MED ORDER — EPOETIN ALFA 10000 UNIT/ML IJ SOLN
INTRAMUSCULAR | Status: AC
  Filled 2019-06-07: qty 1

## 2019-06-12 DIAGNOSIS — R3914 Feeling of incomplete bladder emptying: Secondary | ICD-10-CM | POA: Diagnosis not present

## 2019-06-21 ENCOUNTER — Ambulatory Visit (HOSPITAL_COMMUNITY)
Admission: RE | Admit: 2019-06-21 | Discharge: 2019-06-21 | Disposition: A | Discharge status: Home / Self Care | Payer: Medicare HMO | Source: Ambulatory Visit | Attending: Nephrology | Admitting: Nephrology

## 2019-06-21 ENCOUNTER — Other Ambulatory Visit: Payer: Self-pay

## 2019-06-21 VITALS — BP 138/70 | HR 96 | Temp 96.3°F | Resp 20

## 2019-06-21 DIAGNOSIS — N185 Chronic kidney disease, stage 5: Secondary | ICD-10-CM | POA: Diagnosis present

## 2019-06-21 LAB — RENAL FUNCTION PANEL
Albumin: 3.6 g/dL (ref 3.5–5.0)
Anion gap: 12 (ref 5–15)
BUN: 56 mg/dL — ABNORMAL HIGH (ref 8–23)
CO2: 21 mmol/L — ABNORMAL LOW (ref 22–32)
Calcium: 9.8 mg/dL (ref 8.9–10.3)
Chloride: 107 mmol/L (ref 98–111)
Creatinine, Ser: 4.35 mg/dL — ABNORMAL HIGH (ref 0.61–1.24)
GFR calc Af Amer: 13 mL/min — ABNORMAL LOW (ref >60)
GFR calc non Af Amer: 12 mL/min — ABNORMAL LOW (ref >60)
Glucose, Bld: 101 mg/dL — ABNORMAL HIGH (ref 70–99)
Phosphorus: 4.7 mg/dL — ABNORMAL HIGH (ref 2.5–4.6)
Potassium: 4.3 mmol/L (ref 3.5–5.1)
Sodium: 140 mmol/L (ref 135–145)

## 2019-06-21 LAB — IRON AND TIBC
Iron: 89 ug/dL (ref 45–182)
Saturation Ratios: 33 % (ref 17.9–39.5)
TIBC: 270 ug/dL (ref 250–450)
UIBC: 181 ug/dL

## 2019-06-21 LAB — FERRITIN: Ferritin: 249 ng/mL (ref 24–336)

## 2019-06-21 MED ORDER — EPOETIN ALFA 10000 UNIT/ML IJ SOLN
INTRAMUSCULAR | Status: AC
  Administered 2019-06-21: 10000 [IU] via SUBCUTANEOUS
  Filled 2019-06-21: qty 1

## 2019-06-21 MED ORDER — EPOETIN ALFA 10000 UNIT/ML IJ SOLN: 10000.0000 [IU] | INTRAMUSCULAR | Status: DC

## 2019-06-22 LAB — PTH, INTACT AND CALCIUM
Calcium, Total (PTH): 9.9 mg/dL (ref 8.6–10.2)
PTH: 12 pg/mL — ABNORMAL LOW (ref 15–65)

## 2019-06-22 LAB — POCT HEMOGLOBIN-HEMACUE: Hemoglobin: 11.2 g/dL — ABNORMAL LOW (ref 13.0–17.0)

## 2019-07-05 ENCOUNTER — Ambulatory Visit (HOSPITAL_COMMUNITY)
Admission: RE | Admit: 2019-07-05 | Discharge: 2019-07-05 | Disposition: A | Discharge status: Home / Self Care | Payer: Medicare HMO | Source: Ambulatory Visit | Attending: Nephrology | Admitting: Nephrology

## 2019-07-05 ENCOUNTER — Other Ambulatory Visit: Payer: Self-pay

## 2019-07-05 VITALS — BP 116/63 | HR 99 | Temp 97.3°F | Resp 20

## 2019-07-05 DIAGNOSIS — N185 Chronic kidney disease, stage 5: Secondary | ICD-10-CM | POA: Insufficient documentation

## 2019-07-05 DIAGNOSIS — D631 Anemia in chronic kidney disease: Secondary | ICD-10-CM | POA: Diagnosis not present

## 2019-07-05 DIAGNOSIS — I120 Hypertensive chronic kidney disease with stage 5 chronic kidney disease or end stage renal disease: Secondary | ICD-10-CM | POA: Diagnosis not present

## 2019-07-05 DIAGNOSIS — N2581 Secondary hyperparathyroidism of renal origin: Secondary | ICD-10-CM | POA: Diagnosis not present

## 2019-07-05 LAB — POCT HEMOGLOBIN-HEMACUE: Hemoglobin: 11.2 g/dL — ABNORMAL LOW (ref 13.0–17.0)

## 2019-07-05 MED ORDER — EPOETIN ALFA 10000 UNIT/ML IJ SOLN: 10000.0000 [IU] | INTRAMUSCULAR | Status: DC

## 2019-07-05 MED ORDER — EPOETIN ALFA 10000 UNIT/ML IJ SOLN
INTRAMUSCULAR | Status: AC
  Administered 2019-07-05: 10000 [IU] via SUBCUTANEOUS
  Filled 2019-07-05: qty 1

## 2019-07-17 DIAGNOSIS — R338 Other retention of urine: Secondary | ICD-10-CM | POA: Diagnosis not present

## 2019-07-17 DIAGNOSIS — R310 Gross hematuria: Secondary | ICD-10-CM | POA: Diagnosis not present

## 2019-07-17 DIAGNOSIS — R350 Frequency of micturition: Secondary | ICD-10-CM | POA: Diagnosis not present

## 2019-07-19 ENCOUNTER — Other Ambulatory Visit: Payer: Self-pay

## 2019-07-19 ENCOUNTER — Encounter (HOSPITAL_COMMUNITY)
Admission: RE | Admit: 2019-07-19 | Discharge: 2019-07-19 | Disposition: A | Discharge status: Home / Self Care | Payer: Medicare HMO | Source: Ambulatory Visit | Attending: Nephrology | Admitting: Nephrology

## 2019-07-19 VITALS — BP 128/70 | HR 82 | Temp 97.1°F | Resp 20

## 2019-07-19 DIAGNOSIS — N185 Chronic kidney disease, stage 5: Secondary | ICD-10-CM | POA: Insufficient documentation

## 2019-07-19 LAB — IRON AND TIBC
Iron: 104 ug/dL (ref 45–182)
Saturation Ratios: 38 % (ref 17.9–39.5)
TIBC: 274 ug/dL (ref 250–450)
UIBC: 170 ug/dL

## 2019-07-19 LAB — RENAL FUNCTION PANEL
Albumin: 3.7 g/dL (ref 3.5–5.0)
Anion gap: 14 (ref 5–15)
BUN: 68 mg/dL — ABNORMAL HIGH (ref 8–23)
CO2: 20 mmol/L — ABNORMAL LOW (ref 22–32)
Calcium: 10 mg/dL (ref 8.9–10.3)
Chloride: 105 mmol/L (ref 98–111)
Creatinine, Ser: 4.37 mg/dL — ABNORMAL HIGH (ref 0.61–1.24)
GFR calc Af Amer: 13 mL/min — ABNORMAL LOW (ref >60)
GFR calc non Af Amer: 12 mL/min — ABNORMAL LOW (ref >60)
Glucose, Bld: 113 mg/dL — ABNORMAL HIGH (ref 70–99)
Phosphorus: 4.9 mg/dL — ABNORMAL HIGH (ref 2.5–4.6)
Potassium: 3.6 mmol/L (ref 3.5–5.1)
Sodium: 139 mmol/L (ref 135–145)

## 2019-07-19 LAB — POCT HEMOGLOBIN-HEMACUE: Hemoglobin: 10.9 g/dL — ABNORMAL LOW (ref 13.0–17.0)

## 2019-07-19 LAB — FERRITIN: Ferritin: 233 ng/mL (ref 24–336)

## 2019-07-19 MED ORDER — EPOETIN ALFA 10000 UNIT/ML IJ SOLN
INTRAMUSCULAR | Status: AC
  Filled 2019-07-19: qty 1

## 2019-07-19 MED ORDER — EPOETIN ALFA 10000 UNIT/ML IJ SOLN
10000.0000 [IU] | INTRAMUSCULAR | Status: DC
  Administered 2019-07-19: 08:00:00 10000 [IU] via SUBCUTANEOUS

## 2019-07-20 LAB — PTH, INTACT AND CALCIUM
Calcium, Total (PTH): 9.8 mg/dL (ref 8.6–10.2)
PTH: 10 pg/mL — ABNORMAL LOW (ref 15–65)

## 2019-08-02 ENCOUNTER — Other Ambulatory Visit: Payer: Self-pay

## 2019-08-02 ENCOUNTER — Encounter (HOSPITAL_COMMUNITY)
Admission: RE | Admit: 2019-08-02 | Discharge: 2019-08-02 | Disposition: A | Discharge status: Home / Self Care | Payer: Medicare HMO | Source: Ambulatory Visit | Attending: Nephrology | Admitting: Nephrology

## 2019-08-02 VITALS — BP 142/83 | HR 90 | Temp 96.5°F | Resp 20

## 2019-08-02 DIAGNOSIS — N185 Chronic kidney disease, stage 5: Secondary | ICD-10-CM | POA: Diagnosis not present

## 2019-08-02 LAB — POCT HEMOGLOBIN-HEMACUE: Hemoglobin: 10.9 g/dL — ABNORMAL LOW (ref 13.0–17.0)

## 2019-08-02 MED ORDER — EPOETIN ALFA 10000 UNIT/ML IJ SOLN
10000.0000 [IU] | INTRAMUSCULAR | Status: DC
  Administered 2019-08-02: 10000 [IU] via SUBCUTANEOUS

## 2019-08-02 MED ORDER — EPOETIN ALFA 10000 UNIT/ML IJ SOLN
INTRAMUSCULAR | Status: AC
  Filled 2019-08-02: qty 1

## 2019-08-14 DIAGNOSIS — R3914 Feeling of incomplete bladder emptying: Secondary | ICD-10-CM | POA: Diagnosis not present

## 2019-08-16 ENCOUNTER — Other Ambulatory Visit: Payer: Self-pay

## 2019-08-16 ENCOUNTER — Encounter (HOSPITAL_COMMUNITY)
Admission: RE | Admit: 2019-08-16 | Discharge: 2019-08-16 | Disposition: A | Discharge status: Home / Self Care | Payer: Medicare HMO | Source: Ambulatory Visit | Attending: Nephrology | Admitting: Nephrology

## 2019-08-16 VITALS — BP 116/68 | HR 90 | Temp 94.3°F | Resp 20

## 2019-08-16 DIAGNOSIS — N185 Chronic kidney disease, stage 5: Secondary | ICD-10-CM | POA: Diagnosis present

## 2019-08-16 LAB — RENAL FUNCTION PANEL
Albumin: 3.5 g/dL (ref 3.5–5.0)
Anion gap: 14 (ref 5–15)
BUN: 55 mg/dL — ABNORMAL HIGH (ref 8–23)
CO2: 20 mmol/L — ABNORMAL LOW (ref 22–32)
Calcium: 9.9 mg/dL (ref 8.9–10.3)
Chloride: 107 mmol/L (ref 98–111)
Creatinine, Ser: 4.42 mg/dL — ABNORMAL HIGH (ref 0.61–1.24)
GFR calc Af Amer: 13 mL/min — ABNORMAL LOW (ref >60)
GFR calc non Af Amer: 11 mL/min — ABNORMAL LOW (ref >60)
Glucose, Bld: 113 mg/dL — ABNORMAL HIGH (ref 70–99)
Phosphorus: 4.9 mg/dL — ABNORMAL HIGH (ref 2.5–4.6)
Potassium: 3.8 mmol/L (ref 3.5–5.1)
Sodium: 141 mmol/L (ref 135–145)

## 2019-08-16 LAB — IRON AND TIBC
Iron: 70 ug/dL (ref 45–182)
Saturation Ratios: 26 % (ref 17.9–39.5)
TIBC: 270 ug/dL (ref 250–450)
UIBC: 200 ug/dL

## 2019-08-16 LAB — POCT HEMOGLOBIN-HEMACUE: Hemoglobin: 10.2 g/dL — ABNORMAL LOW (ref 13.0–17.0)

## 2019-08-16 LAB — FERRITIN: Ferritin: 245 ng/mL (ref 24–336)

## 2019-08-16 MED ORDER — EPOETIN ALFA 10000 UNIT/ML IJ SOLN
INTRAMUSCULAR | Status: AC
  Filled 2019-08-16: qty 1

## 2019-08-16 MED ORDER — EPOETIN ALFA 10000 UNIT/ML IJ SOLN
10000.0000 [IU] | INTRAMUSCULAR | Status: DC
  Administered 2019-08-16: 10000 [IU] via SUBCUTANEOUS

## 2019-08-17 ENCOUNTER — Encounter: Payer: Self-pay | Admitting: Family Medicine

## 2019-08-17 ENCOUNTER — Other Ambulatory Visit: Payer: Self-pay

## 2019-08-17 ENCOUNTER — Ambulatory Visit (INDEPENDENT_AMBULATORY_CARE_PROVIDER_SITE_OTHER): Payer: Medicare HMO | Admitting: Family Medicine

## 2019-08-17 VITALS — BP 124/76 | HR 92 | Wt 149.6 lb

## 2019-08-17 DIAGNOSIS — R197 Diarrhea, unspecified: Secondary | ICD-10-CM

## 2019-08-17 DIAGNOSIS — K921 Melena: Secondary | ICD-10-CM | POA: Diagnosis not present

## 2019-08-17 DIAGNOSIS — Z978 Presence of other specified devices: Secondary | ICD-10-CM | POA: Diagnosis not present

## 2019-08-17 DIAGNOSIS — R7309 Other abnormal glucose: Secondary | ICD-10-CM | POA: Diagnosis not present

## 2019-08-17 LAB — PTH, INTACT AND CALCIUM
Calcium, Total (PTH): 9.5 mg/dL (ref 8.6–10.2)
PTH: 10 pg/mL — ABNORMAL LOW (ref 15–65)

## 2019-08-17 NOTE — Patient Instructions (Addendum)
It was a pleasure seeing you today.  You were seen for diarrhea today.  I have ordered blood work and stool samples.  I will call you with the results if they are abnormal.  Follow up with me on March 31 at 445 pm  Have a great weekend   Carollee Leitz MD     Diarrhea, Adult Diarrhea is when you pass loose and watery poop (stool) often. Diarrhea can make you feel weak and cause you to lose water in your body (get dehydrated). Losing water in your body can cause you to:  Feel tired and thirsty.  Have a dry mouth.  Go pee (urinate) less often. Diarrhea often lasts 2-3 days. However, it can last longer if it is a sign of something more serious. It is important to treat your diarrhea as told by your doctor. Follow these instructions at home: Eating and drinking     Follow these instructions as told by your doctor:  Take an ORS (oral rehydration solution). This is a drink that helps you replace fluids and minerals your body lost. It is sold at pharmacies and stores.  Drink plenty of fluids, such as: ? Water. ? Ice chips. ? Diluted fruit juice. ? Low-calorie sports drinks. ? Milk, if you want.  Avoid drinking fluids that have a lot of sugar or caffeine in them.  Eat bland, easy-to-digest foods in small amounts as you are able. These foods include: ? Bananas. ? Applesauce. ? Rice. ? Low-fat (lean) meats. ? Toast. ? Crackers.  Avoid alcohol.  Avoid spicy or fatty foods.  Medicines  Take over-the-counter and prescription medicines only as told by your doctor.  If you were prescribed an antibiotic medicine, take it as told by your doctor. Do not stop using the antibiotic even if you start to feel better. General instructions   Wash your hands often using soap and water. If soap and water are not available, use a hand sanitizer. Others in your home should wash their hands as well. Hands should be washed: ? After using the toilet or changing a diaper. ? Before  preparing, cooking, or serving food. ? While caring for a sick person. ? While visiting someone in a hospital.  Drink enough fluid to keep your pee (urine) pale yellow.  Rest at home while you get better.  Watch your condition for any changes.  Take a warm bath to help with any burning or pain from having diarrhea.  Keep all follow-up visits as told by your doctor. This is important. Contact a doctor if:  You have a fever.  Your diarrhea gets worse.  You have new symptoms.  You cannot keep fluids down.  You feel light-headed or dizzy.  You have a headache.  You have muscle cramps. Get help right away if:  You have chest pain.  You feel very weak or you pass out (faint).  You have bloody or black poop or poop that looks like tar.  You have very bad pain, cramping, or bloating in your belly (abdomen).  You have trouble breathing or you are breathing very quickly.  Your heart is beating very quickly.  Your skin feels cold and clammy.  You feel confused.  You have signs of losing too much water in your body, such as: ? Dark pee, very little pee, or no pee. ? Cracked lips. ? Dry mouth. ? Sunken eyes. ? Sleepiness. ? Weakness. Summary  Diarrhea is when you pass loose and watery poop (stool) often.  Diarrhea can make you feel weak and cause you to lose water in your body (get dehydrated).  Take an ORS (oral rehydration solution). This is a drink that is sold at pharmacies and stores.  Eat bland, easy-to-digest foods in small amounts as you are able.  Contact a doctor if your condition gets worse. Get help right away if you have signs that you have lost too much water in your body. This information is not intended to replace advice given to you by your health care provider. Make sure you discuss any questions you have with your health care provider. Document Revised: 10/28/2017 Document Reviewed: 10/28/2017 Elsevier Patient Education  [place].

## 2019-08-17 NOTE — Progress Notes (Signed)
    SUBJECTIVE:   CHIEF COMPLAINT / HPI:  Diarrhea for one month.  Patient reports explosive diarrhea for one month.  He states that he cannot make it to the bathroom sometimes.  Reports feeling gaseous and that's when he knows he has to go to the bathroom or he will soil his pants. He states that the diarrhea is black and sometimes in little pieces.  He reports having explosive diarrhea twice a day and sometimes every other day.  Denies any nausea, vomiting or abdominal pain.  Denies any fevers, increased thirst, weight loss or dizziness.  No recent travel, sick contact, or changes in diet.  Reports no recent antibiotic use, laxatives.  Lives with caregiver and his girlfriend, both of whom have not recently travelled outside of the [place] or are exhibiting any similar symptoms.    Current medications include Berine and mutivitamins.  He does not take any prescription medications.  He received Procrit 2 weeks ago.   PERTINENT  PMH / PSH:  Chronic indwelling catheter for BPH  OBJECTIVE:   BP 124/76   Pulse 92   Wt 149 lb 9.6 oz (67.9 kg)   SpO2 100%   BMI 20.86 kg/m    General: Alert and oriented, no apparent distress  ENTM: Mucous membranes moist  Gastrointestinal: Soft, nontender, nondistended, no guarding or rigidity.  Bowel sounds present. Skin: Warm and dry, good skin turgor Neuro: Motor and sensation normal Psych: Behavior and speech appropriate to situation  ASSESSMENT/PLAN:   Diarrhea Chronic Diarrhea.  Differentials include UGI bleed, infectious etiology or malabsorption but unclear etiology. Will work up and when resulted can consider GI referral in near future.   -CBC/diff -BMP -TSH, Free T4 -TTG -Stool for O&P and Culture -Stool for C.Diff -Stool for Giardia -Fecal Lactoferrin -FOBT -Consider GI consult when labs return -Continue oral hydration -Follow up appointment March 31 -RTC sooner if worsening symptoms      Carollee Leitz, MD [place]

## 2019-08-18 DIAGNOSIS — R197 Diarrhea, unspecified: Secondary | ICD-10-CM | POA: Diagnosis not present

## 2019-08-20 NOTE — Assessment & Plan Note (Addendum)
Chronic Diarrhea.  Differentials include UGI bleed, infectious etiology or malabsorption but unclear etiology. Will work up and when resulted can consider GI referral in near future.   -CBC/diff -BMP -TSH, Free T4 -TTG -Stool for O&P and Culture -Stool for C.Diff -Stool for Giardia -Fecal Lactoferrin -FOBT -Consider GI consult when labs return -Continue oral hydration -Follow up appointment March 31 -RTC sooner if worsening symptoms

## 2019-08-21 ENCOUNTER — Other Ambulatory Visit: Payer: Self-pay

## 2019-08-21 DIAGNOSIS — R197 Diarrhea, unspecified: Secondary | ICD-10-CM

## 2019-08-21 NOTE — Addendum Note (Signed)
Addended by: [place] Pink on: 08/21/2019 09:42 AM   Modules accepted: Orders

## 2019-08-22 LAB — CBC WITH DIFFERENTIAL/PLATELET
Basophils Absolute: 0 10*3/uL (ref 0.0–0.2)
Basos: 1 %
EOS (ABSOLUTE): 0.4 10*3/uL (ref 0.0–0.4)
Eos: 7 %
Hematocrit: 32.2 % — ABNORMAL LOW (ref 37.5–51.0)
Hemoglobin: 10.6 g/dL — ABNORMAL LOW (ref 13.0–17.7)
Immature Grans (Abs): 0 10*3/uL (ref 0.0–0.1)
Immature Granulocytes: 0 %
Lymphocytes Absolute: 1.7 10*3/uL (ref 0.7–3.1)
Lymphs: 29 %
MCH: 33.2 pg — ABNORMAL HIGH (ref 26.6–33.0)
MCHC: 32.9 g/dL (ref 31.5–35.7)
MCV: 101 fL — ABNORMAL HIGH (ref 79–97)
Monocytes Absolute: 0.7 10*3/uL (ref 0.1–0.9)
Monocytes: 11 %
Neutrophils Absolute: 3 10*3/uL (ref 1.4–7.0)
Neutrophils: 52 %
Platelets: 203 10*3/uL (ref 150–450)
RBC: 3.19 x10E6/uL — ABNORMAL LOW (ref 4.14–5.80)
RDW: 12.6 % (ref 11.6–15.4)
WBC: 5.8 10*3/uL (ref 3.4–10.8)

## 2019-08-22 LAB — TISSUE TRANSGLUTAMINASE ABS,IGG,IGA
Tissue Transglut Ab: 6 U/mL — ABNORMAL HIGH (ref 0–5)
Transglutaminase IgA: <2 U/mL (ref 0–3)

## 2019-08-22 LAB — T4, FREE: Free T4: 0.62 ng/dL — ABNORMAL LOW (ref 0.82–1.77)

## 2019-08-22 LAB — HEMOGLOBIN A1C
Est. average glucose Bld gHb Est-mCnc: 111 mg/dL
Hgb A1c MFr Bld: 5.5 % (ref 4.8–5.6)

## 2019-08-22 LAB — FECAL OCCULT BLOOD, IMMUNOCHEMICAL: Fecal Occult Bld: NEGATIVE

## 2019-08-22 LAB — TSH: TSH: 3.89 u[IU]/mL (ref 0.450–4.500)

## 2019-08-24 LAB — OVA AND PARASITE EXAMINATION

## 2019-08-25 LAB — STOOL CULTURE: E coli, Shiga toxin Assay: NEGATIVE

## 2019-08-26 LAB — C DIFFICILE, CYTOTOXIN B

## 2019-08-26 LAB — C DIFFICILE TOXINS A+B W/RFLX: C difficile Toxins A+B, EIA: NEGATIVE

## 2019-08-29 ENCOUNTER — Encounter: Payer: Self-pay | Admitting: Family Medicine

## 2019-08-30 ENCOUNTER — Encounter (HOSPITAL_COMMUNITY): Payer: Medicare HMO

## 2019-08-30 ENCOUNTER — Encounter (HOSPITAL_COMMUNITY)
Admission: RE | Admit: 2019-08-30 | Discharge: 2019-08-30 | Disposition: A | Discharge status: Home / Self Care | Payer: Medicare HMO | Source: Ambulatory Visit | Attending: Nephrology | Admitting: Nephrology

## 2019-08-30 VITALS — BP 130/71 | HR 86 | Temp 94.8°F | Resp 20

## 2019-08-30 DIAGNOSIS — N185 Chronic kidney disease, stage 5: Secondary | ICD-10-CM | POA: Diagnosis not present

## 2019-08-30 LAB — POCT HEMOGLOBIN-HEMACUE: Hemoglobin: 10.4 g/dL — ABNORMAL LOW (ref 13.0–17.0)

## 2019-08-30 MED ORDER — EPOETIN ALFA 10000 UNIT/ML IJ SOLN
INTRAMUSCULAR | Status: AC
  Filled 2019-08-30: qty 1

## 2019-08-30 MED ORDER — EPOETIN ALFA 10000 UNIT/ML IJ SOLN
10000.0000 [IU] | INTRAMUSCULAR | Status: DC
  Administered 2019-08-30: 10000 [IU] via SUBCUTANEOUS

## 2019-09-05 ENCOUNTER — Ambulatory Visit (INDEPENDENT_AMBULATORY_CARE_PROVIDER_SITE_OTHER): Payer: Medicare HMO | Admitting: Family Medicine

## 2019-09-05 ENCOUNTER — Other Ambulatory Visit: Payer: Self-pay

## 2019-09-05 ENCOUNTER — Encounter: Payer: Self-pay | Admitting: Family Medicine

## 2019-09-05 VITALS — BP 136/70 | HR 82 | Ht 71.0 in | Wt 149.8 lb

## 2019-09-05 DIAGNOSIS — M25511 Pain in right shoulder: Secondary | ICD-10-CM

## 2019-09-05 DIAGNOSIS — I739 Peripheral vascular disease, unspecified: Secondary | ICD-10-CM

## 2019-09-05 DIAGNOSIS — M25562 Pain in left knee: Secondary | ICD-10-CM

## 2019-09-05 DIAGNOSIS — G8929 Other chronic pain: Secondary | ICD-10-CM

## 2019-09-05 DIAGNOSIS — M25512 Pain in left shoulder: Secondary | ICD-10-CM | POA: Diagnosis not present

## 2019-09-05 DIAGNOSIS — Z5189 Encounter for other specified aftercare: Secondary | ICD-10-CM

## 2019-09-05 DIAGNOSIS — M25561 Pain in right knee: Secondary | ICD-10-CM | POA: Diagnosis not present

## 2019-09-05 DIAGNOSIS — R197 Diarrhea, unspecified: Secondary | ICD-10-CM

## 2019-09-05 NOTE — Patient Instructions (Signed)
It was a pleasure seeing you again today.  -Referral was sent for GI  Doctor for further evaluation of your diarrhea -Referral was sent to orthopedic surgery for you shoulder and knee pain -Referral was also sent to physical therapy to help with strengthening -Referral was sent to vascular surgery fo further evaluation of blockage in your left leg -Referral was sent to Geriatric clinic to help with management of care  You will receive calls for appointments within the next few weeks, if you do not hear anything please call the clinic.  If you have any chest pain, shortness of breath or worsening diarrhea please seek medical attention with the nearest Emergency department or Urgent care  Follow up in clinic as needed.  Have a good day and stay safe  Carollee Leitz MD    Plantar Fasciitis  Plantar fasciitis is a painful foot condition that affects the heel. It occurs when the band of tissue that connects the toes to the heel bone (plantar fascia) becomes irritated. This can happen as the result of exercising too much or doing other repetitive activities (overuse injury). The pain from plantar fasciitis can range from mild irritation to severe pain that makes it difficult to walk or move. The pain is usually worse in the morning after sleeping, or after sitting or lying down for a while. Pain may also be worse after long periods of walking or standing. What are the causes? This condition may be caused by:  Standing for long periods of time.  Wearing shoes that do not have good arch support.  Doing activities that put stress on joints (high-impact activities), including running, aerobics, and ballet.  Being overweight.  An abnormal way of walking (gait).  Tight muscles in the back of your lower leg (calf).  High arches in your feet.  Starting a new athletic activity. What are the signs or symptoms? The main symptom of this condition is heel pain. Pain may:  Be worse with first  steps after a time of rest, especially in the morning after sleeping or after you have been sitting or lying down for a while.  Be worse after long periods of standing still.  Decrease after 30-45 minutes of activity, such as gentle walking. How is this diagnosed? This condition may be diagnosed based on your medical history and your symptoms. Your health care provider may ask questions about your activity level. Your health care provider will do a physical exam to check for:  A tender area on the bottom of your foot.  A high arch in your foot.  Pain when you move your foot.  Difficulty moving your foot. You may have imaging tests to confirm the diagnosis, such as:  X-rays.  Ultrasound.  MRI. How is this treated? Treatment for plantar fasciitis depends on how severe your condition is. Treatment may include:  Rest, ice, applying pressure (compression), and raising the affected foot (elevation). This may be called RICE therapy. Your health care provider may recommend RICE therapy along with over-the-counter pain medicines to manage your pain.  Exercises to stretch your calves and your plantar fascia.  A splint that holds your foot in a stretched, upward position while you sleep (night splint).  Physical therapy to relieve symptoms and prevent problems in the future.  Injections of steroid medicine (cortisone) to relieve pain and inflammation.  Stimulating your plantar fascia with electrical impulses (extracorporeal shock wave therapy). This is usually the last treatment option before surgery.  Surgery, if other treatments  have not worked after 12 months. Follow these instructions at home:  Managing pain, stiffness, and swelling  If directed, put ice on the painful area: ? Put ice in a plastic bag, or use a frozen bottle of water. ? Place a towel between your skin and the bag or bottle. ? Roll the bottom of your foot over the bag or bottle. ? Do this for 20 minutes, 2-3  times a day.  Wear athletic shoes that have air-sole or gel-sole cushions, or try wearing soft shoe inserts that are designed for plantar fasciitis.  Raise (elevate) your foot above the level of your heart while you are sitting or lying down. Activity  Avoid activities that cause pain. Ask your health care provider what activities are safe for you.  Do physical therapy exercises and stretches as told by your health care provider.  Try activities and forms of exercise that are easier on your joints (low-impact). Examples include swimming, water aerobics, and biking. General instructions  Take over-the-counter and prescription medicines only as told by your health care provider.  Wear a night splint while sleeping, if told by your health care provider. Loosen the splint if your toes tingle, become numb, or turn cold and blue.  Maintain a healthy weight, or work with your health care provider to lose weight as needed.  Keep all follow-up visits as told by your health care provider. This is important. Contact a health care provider if you:  Have symptoms that do not go away after caring for yourself at home.  Have pain that gets worse.  Have pain that affects your ability to move or do your daily activities. Summary  Plantar fasciitis is a painful foot condition that affects the heel. It occurs when the band of tissue that connects the toes to the heel bone (plantar fascia) becomes irritated.  The main symptom of this condition is heel pain that may be worse after exercising too much or standing still for a long time.  Treatment varies, but it usually starts with rest, ice, compression, and elevation (RICE therapy) and over-the-counter medicines to manage pain. This information is not intended to replace advice given to you by your health care provider. Make sure you discuss any questions you have with your health care provider. Document Revised: 05/06/2017 Document Reviewed:  03/21/2017 Elsevier Patient Education  2020 [employer].

## 2019-09-08 DIAGNOSIS — M25511 Pain in right shoulder: Secondary | ICD-10-CM | POA: Insufficient documentation

## 2019-09-08 DIAGNOSIS — I739 Peripheral vascular disease, unspecified: Secondary | ICD-10-CM | POA: Insufficient documentation

## 2019-09-08 NOTE — Assessment & Plan Note (Signed)
Diarrhea improving but still dark in color. He continues to have periodic abdominal bloating.  Differential include celiac sprue and given  TTG Ab weak positive would like GI input for possible EGD.   -GI referral sent for further evaluation -Continue to hydrate -Follow-up if worsening symptoms

## 2019-09-08 NOTE — Assessment & Plan Note (Signed)
Previous x-rays bilateral knees showing severe arthritis.  He is currently not in any pain and is able to ambulate with no difficulty.  -Referral sent to orthopedics -Referral sent to physiotherapy -Tylenol as needed  -Follow-up as needed

## 2019-09-08 NOTE — Progress Notes (Signed)
    SUBJECTIVE:   CHIEF COMPLAINT / HPI: Follow-up acute diarrhea.  Diarrhea Improving.  Still dark in color.  Results of stool samples and blood work reviewed.   Bilateral knee pain Patient reports history of chronic bilateral knee pain started in 2014 after a fall.  He was previously seen by orthopedics who offered surgery at that time but patient refused.  He has since had physiotherapy without much improvement.  X-rays from 10/2018 shows advanced osteoarthritis in both knees.  Patient reports having increasing difficulty going up stairs but does report having a lift at home for this reason.  Bilateral shoulder pain Patient reports history of chronic bilateral shoulder pain that resulted from trying to prevent fall in 2014.  X-ray right shoulder 2017 shows mild degenerative changes.  PERTINENT  PMH / PSH: PmHx: Arthritis, bilateral cataracts, CKD with chronic anemia followed by Dr. Posey Pronto, chronic indwelling catheter.  OBJECTIVE:   BP 136/70   Pulse 82   Ht 5\' 11"  (1.803 m)   Wt 149 lb 12.8 oz (67.9 kg)   SpO2 98%   BMI 20.89 kg/m    General: Alert and oriented, no apparent distress  Cardiovascular: RRR with no murmurs noted Respiratory: CTA bilaterally  Gastrointestinal: Bowel sounds present. No abdominal pain MSK: Range of motion normal bilateral shoulders and knees.  No swelling, tenderness or erythema noted at large joint sites.  5/5 strength bilaterally upper and lower extremities  ASSESSMENT/PLAN:   Diarrhea Diarrhea improving but still dark in color. He continues to have periodic abdominal bloating.  Differential include celiac sprue and given  TTG Ab weak positive would like GI input for possible EGD.   -GI referral sent for further evaluation -Continue to hydrate -Follow-up if worsening symptoms   Bilateral knee pain Previous x-rays bilateral knees showing severe arthritis.  He is currently not in any pain and is able to ambulate with no difficulty.  -Referral  sent to orthopedics -Referral sent to physiotherapy -Tylenol as needed  -Follow-up as needed  Bilateral shoulder pain Chronic bilateral shoulder pain from trying to prevent fall in 2014.  Previous right shoulder x-ray showed mild degenerative changes.  Given age and chronicity likely arthritis because of bilateral shoulder pain. -Referral sent to orthopedics -Referral sent to physiotherapy -Tylenol as needed    Aging process Patient has a live-in caregiver and is well cared for.  Feels safe at home.  Given history of bilateral cataracts, severe arthritis and increasing age, I think patient will benefit from geriatric clinic. -Referral to geriatric clinic   Carollee Leitz, MD [place]

## 2019-09-08 NOTE — Assessment & Plan Note (Signed)
Chronic bilateral shoulder pain from trying to prevent fall in 2014.  Previous right shoulder x-ray showed mild degenerative changes.  Given age and chronicity likely arthritis because of bilateral shoulder pain. -Referral sent to orthopedics -Referral sent to physiotherapy -Tylenol as needed

## 2019-09-08 NOTE — Assessment & Plan Note (Signed)
Had report from recent vascular study suggesting severe stenosis on the left lower extremity.   -Referal sent to vascular surgery for recommendations -Follow-up as needed

## 2019-09-11 ENCOUNTER — Other Ambulatory Visit: Payer: Self-pay

## 2019-09-11 ENCOUNTER — Ambulatory Visit: Payer: Medicare HMO | Admitting: Family Medicine

## 2019-09-11 ENCOUNTER — Encounter: Payer: Self-pay | Admitting: Family Medicine

## 2019-09-11 DIAGNOSIS — M1711 Unilateral primary osteoarthritis, right knee: Secondary | ICD-10-CM

## 2019-09-11 DIAGNOSIS — R3914 Feeling of incomplete bladder emptying: Secondary | ICD-10-CM | POA: Diagnosis not present

## 2019-09-11 DIAGNOSIS — M1712 Unilateral primary osteoarthritis, left knee: Secondary | ICD-10-CM

## 2019-09-11 DIAGNOSIS — G8929 Other chronic pain: Secondary | ICD-10-CM | POA: Diagnosis not present

## 2019-09-11 DIAGNOSIS — M25511 Pain in right shoulder: Secondary | ICD-10-CM | POA: Diagnosis not present

## 2019-09-11 DIAGNOSIS — M25512 Pain in left shoulder: Secondary | ICD-10-CM | POA: Diagnosis not present

## 2019-09-11 NOTE — Progress Notes (Signed)
I saw and examined the patient with Dr. Mayer Masker and agree with assessment and plan as outlined.    Right knee medial compartment OA and left knee lateral compartment OA with 1+ laxity in each but solid endpoints.  Has tried multiple conservative options and really wants to avoid replacement.  Will try bilateral OA braces.  If no improvement, then possibly referral to Dr. Ernestina Patches for genicular nerve blocks.  Also having bilateral chronic shoulder pain with crepitus.  Will try PT for this.  Consider GH injections in future.

## 2019-09-11 NOTE — Progress Notes (Signed)
Richard Moses - 86 y.o. male MRN [id number]  Date of birth: 01-25-1933  Office Visit Note: Visit Date: 09/11/2019 PCP: Richard Leitz, MD Referred by: Richard La, MD  Subjective: Chief Complaint  Patient presents with  . Right Knee - Pain    Chronic pain both knees, left worse than right. Difficulty going up/down stairs. Would like another opinion of options for the knees - has seen other doctors that suggested knee replacement.  . Left Knee - Pain   HPI: Richard Moses is a 86 y.o. male who comes in today with chronic bilateral knee pain.  He has known advanced DJD in both knees, with left worse in lateral and patellofemoral compartments and right worse in medial compartment. He has pain with going up and down stairs and has pain with turning- sometimes the pain causes him to fall and he cannot be back up. He reports that he has seen many different doctors, has tried many different therapies (steroid injections, gel injections, PRP). Has worn a body helix knee sleeve on his knee in the past and it helped slightly. He has tried PT and it did not really help but it did help him to maintain his strength. He uses a chair lift at home and has a live in nursing aide that helps him at home.  Also reporting bilateral shoulder pain with crepitus for the past 8 years.  ROS Otherwise per HPI.  Assessment & Plan: Visit Diagnoses:  1. Chronic pain of both shoulders   2. Unilateral primary osteoarthritis, left knee   3. Unilateral primary osteoarthritis, right knee     Plan: Bilateral severe OA causing chronic knee pain. Right knee has medial compartment DJD and left knee has lateral compartment DJD, which is reflected with psuedolaxity on exam. He has tried injections, helix bracing, physical therapy. Would like to avoid knee replacement surgery. Will trial offloading braces for OA. If no improvement, will refer to Dr. Ernestina Moses for genicular nerve block.  For bilateral shoulders, will refer to PT  (already has referral for general strength and function).   Meds & Orders: No orders of the defined types were placed in this encounter.   Orders Placed This Encounter  Procedures  . Ambulatory referral to Physical Therapy    Follow-up: PRN  Procedures: No procedures performed  No notes on file   Clinical History: No specialty comments available.   He reports that he quit smoking about 52 years ago. His smoking use included cigarettes. He has never used smokeless tobacco.  Recent Labs    08/17/19 1715  HGBA1C 5.5    Objective:  VS:  HT:    WT:   BMI:     BP:   HR: bpm  TEMP: ( )  RESP:  Physical Exam  PHYSICAL EXAM: Gen: NAD, alert, cooperative with exam, well-appearing HEENT: clear conjunctiva,  CV:  no edema, capillary refill brisk, normal rate Resp: non-labored Skin: no rashes, normal turgor  Neuro: no gross deficits.   Ortho Exam  Left Knee: - Inspection: trace effusion - Palpation: no TTP - ROM: mild flexure contracture preventing full extension. Full flexion. 3+ crepitus with movement - Strength: 5/5 strength - Neuro/vasc: NV intact - Special Tests: - LIGAMENTS: negative anterior and posterior drawer, some laxity varus stressing (lateral) -- MENISCUS: negative McMurray's -- PF JOINT: nml patellar mobility bilaterally.    Left Knee: - Inspection: trace effusion - Palpation: no TTP - ROM: mild flexure contracture preventing full extension. Full flexion.  3+ crepitus with movement - Strength: 5/5 strength - Neuro/vasc: NV intact - Special Tests: - LIGAMENTS: negative anterior and posterior drawer, some laxity valgus stressing (medial) -- MENISCUS: negative McMurray's -- PF JOINT: nml patellar mobility bilaterally.    Hips: normal ROM, negative FABER and FADIR bilaterally Imaging: No results found.  Past Medical/Family/Surgical/Social History: Medications & Allergies reviewed per EMR, new medications updated. Patient Active Problem List    Diagnosis Date Noted  . Bilateral shoulder pain 09/08/2019  . Peripheral artery disease ([place]) 09/08/2019  . Chronic indwelling Foley catheter 08/17/2019  . Diarrhea 08/17/2019  . Secondary hyperparathyroidism ([place]) 03/03/2018  . Memory disturbance 03/03/2017  . Bilateral knee pain 03/03/2017  . Bilateral cataracts 01/17/2017  . Peripheral neuropathy 11/30/2016  . Anemia associated with stage 5 chronic renal failure ([place]) 07/14/2016  . Chronic kidney disease, stage V ([place]) 04/17/2014  . Primary hypertension 12/25/2009   Past Medical History:  Diagnosis Date  . Abnormal MRI, cervical spine 09/29/10   Mildly abnormal MRI cervical spine demonstrating mild spondylosis and disc bulging from C4-5 down to C7-T1.  No spinal stenosis or foraminal narrowing  . Anemia   . Arthritis   . Blood transfusion without reported diagnosis   . Cancer [facility])    possible melanoma on lt.leg  . Cataract    beginning stage  . Cervical disc herniation 09/29/10   C4-C5 and C7-T1  . Chronic kidney disease (CKD)   . Chronic left-sided headaches   . Colon polyp 2007  . Diabetes mellitus   . External hemorrhoids without mention of complication 9509  . GERD (gastroesophageal reflux disease)   . Hyperlipidemia   . Hypertension   . Hypothyroidism 09/29/10   Untreated. Patient not interested in Rx.   . Intermittent self-catheterization of bladder    placed every month  . Knee pain, bilateral 02/05/2013   Pain since a fall in 2014  . MRI of brain abnormal 09/29/10   Abnormal MRI of brain, demonstrating mild atrophy  . Neck pain on left side 2012   Family History  Problem Relation Age of Onset  . Cancer Father 62       Renal CA  . Cancer Sister        Brain   . Cancer Brother        Brain  . Colon cancer Neg Hx   . Stomach cancer Neg Hx   . Colon polyps Neg Hx   . Esophageal cancer Neg Hx   . Rectal cancer Neg Hx    Past Surgical History:  Procedure Laterality Date  . COLONOSCOPY    . MELANOMA  EXCISION     left side of neck  . POLYPECTOMY    . RHINOPLASTY     x2    Social History   Occupational History  . Occupation: Education officer, environmental: OTHER  Tobacco Use  . Smoking status: Former Smoker    Types: Cigarettes    Quit date: 12/06/1966    Years since quitting: 52.8  . Smokeless tobacco: Never Used  Substance and Sexual Activity  . Alcohol use: Yes    Alcohol/week: 0.0 standard drinks    Comment: occasionally - had cut back since diabetes  . Drug use: No  . Sexual activity: Never

## 2019-09-13 ENCOUNTER — Other Ambulatory Visit: Payer: Self-pay

## 2019-09-13 ENCOUNTER — Ambulatory Visit (HOSPITAL_COMMUNITY)
Admission: RE | Admit: 2019-09-13 | Discharge: 2019-09-13 | Disposition: A | Discharge status: Home / Self Care | Payer: Medicare HMO | Source: Ambulatory Visit | Attending: Nephrology | Admitting: Nephrology

## 2019-09-13 VITALS — BP 137/71 | HR 88 | Temp 94.6°F | Resp 20

## 2019-09-13 DIAGNOSIS — N401 Enlarged prostate with lower urinary tract symptoms: Secondary | ICD-10-CM | POA: Diagnosis not present

## 2019-09-13 DIAGNOSIS — R3914 Feeling of incomplete bladder emptying: Secondary | ICD-10-CM | POA: Diagnosis not present

## 2019-09-13 DIAGNOSIS — N185 Chronic kidney disease, stage 5: Secondary | ICD-10-CM | POA: Insufficient documentation

## 2019-09-13 DIAGNOSIS — D631 Anemia in chronic kidney disease: Secondary | ICD-10-CM | POA: Diagnosis not present

## 2019-09-13 LAB — FERRITIN: Ferritin: 276 ng/mL (ref 24–336)

## 2019-09-13 LAB — RENAL FUNCTION PANEL
Albumin: 3.7 g/dL (ref 3.5–5.0)
Anion gap: 15 (ref 5–15)
BUN: 58 mg/dL — ABNORMAL HIGH (ref 8–23)
CO2: 20 mmol/L — ABNORMAL LOW (ref 22–32)
Calcium: 10.2 mg/dL (ref 8.9–10.3)
Chloride: 106 mmol/L (ref 98–111)
Creatinine, Ser: 4.62 mg/dL — ABNORMAL HIGH (ref 0.61–1.24)
GFR calc Af Amer: 12 mL/min — ABNORMAL LOW (ref >60)
GFR calc non Af Amer: 11 mL/min — ABNORMAL LOW (ref >60)
Glucose, Bld: 112 mg/dL — ABNORMAL HIGH (ref 70–99)
Phosphorus: 5.5 mg/dL — ABNORMAL HIGH (ref 2.5–4.6)
Potassium: 3.9 mmol/L (ref 3.5–5.1)
Sodium: 141 mmol/L (ref 135–145)

## 2019-09-13 LAB — IRON AND TIBC
Iron: 94 ug/dL (ref 45–182)
Saturation Ratios: 34 % (ref 17.9–39.5)
TIBC: 279 ug/dL (ref 250–450)
UIBC: 185 ug/dL

## 2019-09-13 LAB — POCT HEMOGLOBIN-HEMACUE: Hemoglobin: 10 g/dL — ABNORMAL LOW (ref 13.0–17.0)

## 2019-09-13 MED ORDER — EPOETIN ALFA 10000 UNIT/ML IJ SOLN
INTRAMUSCULAR | Status: AC
  Administered 2019-09-13: 10000 [IU] via SUBCUTANEOUS
  Filled 2019-09-13: qty 1

## 2019-09-13 MED ORDER — EPOETIN ALFA 10000 UNIT/ML IJ SOLN: 10000.0000 [IU] | INTRAMUSCULAR | Status: DC

## 2019-09-14 LAB — PTH, INTACT AND CALCIUM
Calcium, Total (PTH): 10 mg/dL (ref 8.6–10.2)
PTH: 11 pg/mL — ABNORMAL LOW (ref 15–65)

## 2019-09-24 ENCOUNTER — Telehealth: Payer: Self-pay | Admitting: Family Medicine

## 2019-09-24 ENCOUNTER — Ambulatory Visit (INDEPENDENT_AMBULATORY_CARE_PROVIDER_SITE_OTHER): Payer: Medicare HMO | Admitting: Nurse Practitioner

## 2019-09-24 ENCOUNTER — Encounter: Payer: Self-pay | Admitting: Nurse Practitioner

## 2019-09-24 VITALS — BP 120/70 | HR 90 | Temp 97.6°F | Wt 146.2 lb

## 2019-09-24 DIAGNOSIS — R194 Change in bowel habit: Secondary | ICD-10-CM

## 2019-09-24 NOTE — Progress Notes (Signed)
IMPRESSION and PLAN:    86 year old male with CKD, history of colon polyps, gastritis, cholelithiasis, emphysema  # Bowel changes. Unformed stools, 2-3 times a day x 6 months --Minimally elevated TTG of 6.  Tolerates gluten.   Normal duodenum on EGD in 2019 --Unable to correlate bowel changes with diet.  He takes no medications.  He has been on same supplements for years ( we reviewed all of them together) --Negative C. difficile and stool culture early March. --Etiology of bowel changes unclear but patient is nontoxic-appearing, no weight loss, no blood in stool.  I think at this point it is safe to try an antidiarrheal and hold off on further evaluation.  --Imodium 2 mg twice daily.  Call in 10 days with condition update  # History of colon polyps.  --Adenomas removed at time of last colonoscopy May 2019.  Future polyp surveillance colonoscopy not recommended due to age  # Chronic [place] anemia / CKD --Gets Procrit injections every other week  HPI:    Primary GI: Dr. Havery Moros  Chief complaint : Diarrhea  **History comes from the chart and patient   Richard Moses is an 86 year old male here with bowel changes.  Approximately 6 months ago he began having bowel movements which were of a pudding consistency.  He is having anywhere from 2-4 bowel movements a day, some nocturnal stooling included.  Patient saw his PCP for evaluation in mid March.  He described dark stools but was guaiac negative.  Stool studies including C. difficile and stool culture were negative.  TTG minimally elevated at 6.  hemoglobin was 10.2, it is stable at 10 as of 09/13/2019.  Ferritin 276, TIBC 279, 34% saturation.  Patient gets Procrit under direction of nephrology.  Patient does not take any medications at home.  However, he is on several supplements but says he has taken the same ones for years.  He cannot correlate bowel changes with any particular foods such as gluten or lactose.  He did increase  fiber intake to try and get stools more of solid consistency.  He reports reports no major weight changes associated with the bowel changes. He has no abdominal pain associated with bowel changes   Review of systems:     No chest pain, no SOB, no fevers, no urinary sx   Past Medical History:  Diagnosis Date  . Abnormal MRI, cervical spine 09/29/10   Mildly abnormal MRI cervical spine demonstrating mild spondylosis and disc bulging from C4-5 down to C7-T1.  No spinal stenosis or foraminal narrowing  . Anemia   . Arthritis   . Blood transfusion without reported diagnosis   . Cancer [facility])    possible melanoma on lt.leg  . Cataract    beginning stage  . Cervical disc herniation 09/29/10   C4-C5 and C7-T1  . Chronic kidney disease (CKD)   . Chronic left-sided headaches   . Colon polyp 2007  . Diabetes mellitus   . External hemorrhoids without mention of complication 8502  . GERD (gastroesophageal reflux disease)   . Hyperlipidemia   . Hypertension   . Hypothyroidism 09/29/10   Untreated. Patient not interested in Rx.   . Intermittent self-catheterization of bladder    placed every month  . Knee pain, bilateral 02/05/2013   Pain since a fall in 2014  . MRI of brain abnormal 09/29/10   Abnormal MRI of brain, demonstrating mild atrophy  . Neck pain on left side  2012    Patient's surgical history, family medical history, social history, medications and allergies were all reviewed in Epic   Serum creatinine: 4.62 mg/dL (H) 09/13/19 0809 Estimated creatinine clearance: 11 mL/min (A)  Current Outpatient Medications  Medication Sig Dispense Refill  . ascorbic acid (VITAMIN C) 500 MG tablet Take by mouth.    Richard Moses Grape-Goldenseal (BERBERINE COMPLEX PO) Take 500 mg by mouth 3 (three) times daily with meals. Reported on 12/11/2015    . Cholecalciferol (VITAMIN D3) 10 MCG (400 UNIT) tablet Take by mouth.    . Multiple Vitamins-Minerals (MENS MULTIVITAMIN PLUS) TABS Take 1 tablet by  mouth daily after breakfast.    . Omega-3 Fatty Acids (FISH OIL PO) Take by mouth daily.     No current facility-administered medications for this visit.    Physical Exam:     BP 120/70 (BP Location: Left Arm, Patient Position: Sitting, Cuff Size: Normal)   Pulse 90   Temp 97.6 F (36.4 C)   Wt 146 lb 4 oz (66.3 kg)   BMI 20.40 kg/m   GENERAL:  Pleasant thin male in NAD PSYCH: : Cooperative, normal affect CARDIAC:  RRR, + murmur heard, no peripheral edema PULM: Normal respiratory effort, lungs CTA bilaterally, no wheezing ABDOMEN:  Nondistended, soft, nontender. No obvious masses, no hepatomegaly,  normal bowel sounds SKIN:  turgor, no lesions seen Musculoskeletal:  Normal muscle tone, normal strength NEURO: Alert and oriented x 3, no focal neurologic deficits  I spent 30 minutes total reviewing records, obtaining history, performing exam, counseling patient and documenting visit / findings.   Tye Savoy , NP 09/24/2019, 9:45 AM

## 2019-09-24 NOTE — Progress Notes (Signed)
Agree with assessment and plan as outlined.  

## 2019-09-24 NOTE — Telephone Encounter (Signed)
Patient called.   Said he had a missed call from our office but I didn't see any upcoming appointments or telephone encounters in his chart. Told him I would make his provider aware that he called though   Call back: [phone]

## 2019-09-24 NOTE — Telephone Encounter (Signed)
I, also, could not see who had called the patient from this office.

## 2019-09-24 NOTE — Patient Instructions (Signed)
If you are age 84 or older, your body mass index should be between 23-30. Your Body mass index is 20.4 kg/m. If this is out of the aforementioned range listed, please consider follow up with your Primary Care Provider.  If you are age 32 or younger, your body mass index should be between 19-25. Your Body mass index is 20.4 kg/m. If this is out of the aformentioned range listed, please consider follow up with your Primary Care Provider.   START Imodium 2 mg twice a day. You can buy this over the counter.  Call the office in 2 weeks to update [place] on how you are doing.

## 2019-09-26 DIAGNOSIS — M1711 Unilateral primary osteoarthritis, right knee: Secondary | ICD-10-CM | POA: Diagnosis not present

## 2019-09-26 DIAGNOSIS — M1712 Unilateral primary osteoarthritis, left knee: Secondary | ICD-10-CM | POA: Diagnosis not present

## 2019-09-27 ENCOUNTER — Ambulatory Visit (HOSPITAL_COMMUNITY)
Admission: RE | Admit: 2019-09-27 | Discharge: 2019-09-27 | Disposition: A | Discharge status: Home / Self Care | Payer: Medicare HMO | Source: Ambulatory Visit | Attending: Nephrology | Admitting: Nephrology

## 2019-09-27 ENCOUNTER — Other Ambulatory Visit: Payer: Self-pay

## 2019-09-27 VITALS — BP 128/75 | HR 97 | Temp 95.6°F | Resp 20

## 2019-09-27 DIAGNOSIS — N185 Chronic kidney disease, stage 5: Secondary | ICD-10-CM | POA: Insufficient documentation

## 2019-09-27 DIAGNOSIS — D631 Anemia in chronic kidney disease: Secondary | ICD-10-CM | POA: Insufficient documentation

## 2019-09-27 LAB — POCT HEMOGLOBIN-HEMACUE: Hemoglobin: 10.1 g/dL — ABNORMAL LOW (ref 13.0–17.0)

## 2019-09-27 MED ORDER — EPOETIN ALFA 10000 UNIT/ML IJ SOLN
10000.0000 [IU] | INTRAMUSCULAR | Status: DC
  Administered 2019-09-27: 10000 [IU] via SUBCUTANEOUS

## 2019-09-27 MED ORDER — EPOETIN ALFA 10000 UNIT/ML IJ SOLN
INTRAMUSCULAR | Status: AC
  Filled 2019-09-27: qty 1

## 2019-10-09 DIAGNOSIS — R3914 Feeling of incomplete bladder emptying: Secondary | ICD-10-CM | POA: Diagnosis not present

## 2019-10-11 ENCOUNTER — Ambulatory Visit (HOSPITAL_COMMUNITY)
Admission: RE | Admit: 2019-10-11 | Discharge: 2019-10-11 | Disposition: A | Discharge status: Home / Self Care | Payer: Medicare HMO | Source: Ambulatory Visit | Attending: Nephrology | Admitting: Nephrology

## 2019-10-11 ENCOUNTER — Other Ambulatory Visit: Payer: Self-pay | Admitting: *Deleted

## 2019-10-11 ENCOUNTER — Ambulatory Visit (INDEPENDENT_AMBULATORY_CARE_PROVIDER_SITE_OTHER)
Admission: RE | Admit: 2019-10-11 | Discharge: 2019-10-11 | Disposition: A | Discharge status: Home / Self Care | Payer: Medicare HMO | Source: Ambulatory Visit | Attending: Vascular Surgery | Admitting: Vascular Surgery

## 2019-10-11 ENCOUNTER — Ambulatory Visit (INDEPENDENT_AMBULATORY_CARE_PROVIDER_SITE_OTHER): Payer: Medicare HMO | Admitting: Vascular Surgery

## 2019-10-11 ENCOUNTER — Encounter: Payer: Self-pay | Admitting: Vascular Surgery

## 2019-10-11 ENCOUNTER — Other Ambulatory Visit: Payer: Self-pay

## 2019-10-11 VITALS — BP 146/69 | HR 72 | Temp 96.1°F | Resp 20

## 2019-10-11 VITALS — BP 117/68 | HR 84 | Temp 98.4°F | Resp 20 | Ht 71.0 in | Wt 150.0 lb

## 2019-10-11 DIAGNOSIS — R338 Other retention of urine: Secondary | ICD-10-CM | POA: Diagnosis not present

## 2019-10-11 DIAGNOSIS — M79606 Pain in leg, unspecified: Secondary | ICD-10-CM | POA: Diagnosis not present

## 2019-10-11 DIAGNOSIS — I739 Peripheral vascular disease, unspecified: Secondary | ICD-10-CM | POA: Diagnosis not present

## 2019-10-11 DIAGNOSIS — D631 Anemia in chronic kidney disease: Secondary | ICD-10-CM | POA: Diagnosis not present

## 2019-10-11 DIAGNOSIS — N2581 Secondary hyperparathyroidism of renal origin: Secondary | ICD-10-CM | POA: Diagnosis not present

## 2019-10-11 DIAGNOSIS — I120 Hypertensive chronic kidney disease with stage 5 chronic kidney disease or end stage renal disease: Secondary | ICD-10-CM | POA: Diagnosis not present

## 2019-10-11 DIAGNOSIS — N185 Chronic kidney disease, stage 5: Secondary | ICD-10-CM | POA: Insufficient documentation

## 2019-10-11 DIAGNOSIS — N210 Calculus in bladder: Secondary | ICD-10-CM | POA: Diagnosis not present

## 2019-10-11 LAB — IRON AND TIBC
Iron: 86 ug/dL (ref 45–182)
Saturation Ratios: 30 % (ref 17.9–39.5)
TIBC: 287 ug/dL (ref 250–450)
UIBC: 201 ug/dL

## 2019-10-11 LAB — RENAL FUNCTION PANEL
Albumin: 3.7 g/dL (ref 3.5–5.0)
Anion gap: 12 (ref 5–15)
BUN: 65 mg/dL — ABNORMAL HIGH (ref 8–23)
CO2: 22 mmol/L (ref 22–32)
Calcium: 9.5 mg/dL (ref 8.9–10.3)
Chloride: 106 mmol/L (ref 98–111)
Creatinine, Ser: 4.91 mg/dL — ABNORMAL HIGH (ref 0.61–1.24)
GFR calc Af Amer: 12 mL/min — ABNORMAL LOW (ref >60)
GFR calc non Af Amer: 10 mL/min — ABNORMAL LOW (ref >60)
Glucose, Bld: 107 mg/dL — ABNORMAL HIGH (ref 70–99)
Phosphorus: 4.8 mg/dL — ABNORMAL HIGH (ref 2.5–4.6)
Potassium: 3.6 mmol/L (ref 3.5–5.1)
Sodium: 140 mmol/L (ref 135–145)

## 2019-10-11 LAB — FERRITIN: Ferritin: 266 ng/mL (ref 24–336)

## 2019-10-11 LAB — POCT HEMOGLOBIN-HEMACUE: Hemoglobin: 9.8 g/dL — ABNORMAL LOW (ref 13.0–17.0)

## 2019-10-11 MED ORDER — EPOETIN ALFA 10000 UNIT/ML IJ SOLN
INTRAMUSCULAR | Status: AC
  Administered 2019-10-11: 08:00:00 10000 [IU] via SUBCUTANEOUS
  Filled 2019-10-11: qty 1

## 2019-10-11 MED ORDER — EPOETIN ALFA 10000 UNIT/ML IJ SOLN: 10000.0000 [IU] | INTRAMUSCULAR | Status: DC

## 2019-10-11 NOTE — Progress Notes (Signed)
Referring Physician: [facility]  Patient name: Richard Moses MRN: [id number] DOB: 12-17-1932 Sex: male  REASON FOR CONSULT: Abnormal noninvasive vascular test  HPI: Richard Moses is a 86 y.o. male, was recently Found to have an abnormal noninvasive vascular test on screening exam. He does not have claudication. He states he can walk all day without any difficulty. He does get nighttime cramps. I did discuss with him that these are unrelated to vascular disease.  He has not had any nonhealing wounds on his feet. He does have an area of his right first toe which occasionally rubs an ulcer on his right second toe. We discussed getting podiatry to file these nails for him. Other medical problems include arthritis, bladder outlet obstruction requiring chronic Foley catheter, diabetes all of which are stable.  Past Medical History:  Diagnosis Date  . Abnormal MRI, cervical spine 09/29/10   Mildly abnormal MRI cervical spine demonstrating mild spondylosis and disc bulging from C4-5 down to C7-T1.  No spinal stenosis or foraminal narrowing  . Anemia   . Arthritis   . Blood transfusion without reported diagnosis   . Cancer [facility])    possible melanoma on lt.leg  . Cataract    beginning stage  . Cervical disc herniation 09/29/10   C4-C5 and C7-T1  . Chronic kidney disease (CKD)   . Chronic left-sided headaches   . Colon polyp 2007  . Diabetes mellitus   . External hemorrhoids without mention of complication 1194  . GERD (gastroesophageal reflux disease)   . Hyperlipidemia   . Hypertension   . Hypothyroidism 09/29/10   Untreated. Patient not interested in Rx.   . Intermittent self-catheterization of bladder    placed every month  . Knee pain, bilateral 02/05/2013   Pain since a fall in 2014  . MRI of brain abnormal 09/29/10   Abnormal MRI of brain, demonstrating mild atrophy  . Neck pain on left side 2012   Past Surgical History:  Procedure Laterality Date  . COLONOSCOPY     . MELANOMA EXCISION     left side of neck  . POLYPECTOMY    . RHINOPLASTY     x2     Family History  Problem Relation Age of Onset  . Cancer Father 73       Renal CA  . Cancer Sister        Brain   . Cancer Brother        Brain  . Colon cancer Neg Hx   . Stomach cancer Neg Hx   . Colon polyps Neg Hx   . Esophageal cancer Neg Hx   . Rectal cancer Neg Hx     SOCIAL HISTORY: Social History   Socioeconomic History  . Marital status: Single    Spouse name: Not on file  . Number of children: 0  . Years of education: Not on file  . Highest education level: Not on file  Occupational History  . Occupation: Education officer, environmental: OTHER  Tobacco Use  . Smoking status: Former Smoker    Types: Cigarettes    Quit date: 12/06/1966    Years since quitting: 52.8  . Smokeless tobacco: Never Used  Substance and Sexual Activity  . Alcohol use: Yes    Alcohol/week: 0.0 standard drinks    Comment: occasionally - had cut back since diabetes  . Drug use: No  . Sexual activity: Never  Other Topics Concern  . Not on  file  Social History Narrative   Lives with boarder (young [place] man) - he helps with computer work managing his property in [place]. 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      Family lives in [place] where he is from originally       Updated 12/11/2015   Social Determinants of Health   Financial Resource Strain:   . Difficulty of Paying Living Expenses:   Food Insecurity:   . Worried About Charity fundraiser in the Last Year:   . Arboriculturist in the Last Year:   Transportation Needs:   . Film/video editor (Medical):   Marland Kitchen Lack of Transportation (Non-Medical):   Physical Activity:   . Days of Exercise per Week:   . Minutes of Exercise per Session:   Stress:   . Feeling of Stress :   Social Connections:   . Frequency of Communication with Friends and Family:   . Frequency of Social Gatherings with Friends and Family:   . Attends  Religious Services:   . Active Member of Clubs or Organizations:   . Attends Archivist Meetings:   Marland Kitchen Marital Status:   Intimate Partner Violence:   . Fear of Current or Ex-Partner:   . Emotionally Abused:   Marland Kitchen Physically Abused:   . Sexually Abused:     No Known Allergies  Current Outpatient Medications  Medication Sig Dispense Refill  . ascorbic acid (VITAMIN C) 500 MG tablet Take by mouth.    Richard Moses Grape-Goldenseal (BERBERINE COMPLEX PO) Take 500 mg by mouth 3 (three) times daily with meals. Reported on 12/11/2015    . Cholecalciferol (VITAMIN D3) 10 MCG (400 UNIT) tablet Take by mouth.    Marland Kitchen Epoetin Alfa (PROCRIT IJ) Inject as directed every 14 (fourteen) days.    . Multiple Vitamins-Minerals (MENS MULTIVITAMIN PLUS) TABS Take 1 tablet by mouth daily after breakfast.    . Omega-3 Fatty Acids (FISH OIL PO) Take by mouth daily.     No current facility-administered medications for this visit.   Facility-Administered Medications Ordered in Other Visits  Medication Dose Route Frequency Provider Last Rate Last Admin  . epoetin alfa (EPOGEN) injection 10,000 Units  10,000 Units Subcutaneous Q14 Days Elmarie Shiley, MD   10,000 Units at 10/11/19 0814    ROS:   General:  No weight loss, Fever, chills  HEENT: No recent headaches, no nasal bleeding, no visual changes, no sore throat  Neurologic: No dizziness, blackouts, seizures. No recent symptoms of stroke or mini- stroke. No recent episodes of slurred speech, or temporary blindness.  Cardiac: No recent episodes of chest pain/pressure, no shortness of breath at rest.  No shortness of breath with exertion.  Denies history of atrial fibrillation or irregular heartbeat  Vascular: No history of rest pain in feet.  No history of claudication.  No history of non-healing ulcer, No history of DVT   Pulmonary: No home oxygen, no productive cough, no hemoptysis,  No asthma or wheezing  Musculoskeletal:  [X]  Arthritis, [ ]  Low  back pain,  [ ]  Joint pain  Hematologic:No history of hypercoagulable state.  No history of easy bleeding.  No history of anemia  Gastrointestinal: No hematochezia or melena,  No gastroesophageal reflux, no trouble swallowing  Urinary: [ ]  chronic Kidney disease, [ ]  on HD - [ ]  MWF or [ ]  TTHS, [ ]  Burning with urination, [ ]  Frequent urination, [x ] Difficulty urinating;   Skin: No rashes  Psychological: No history of anxiety,  No history of depression   Physical Examination  Vitals:   10/11/19 1458  BP: 117/68  Pulse: 84  Resp: 20  Temp: 98.4 F (36.9 C)  SpO2: 96%  Weight: 150 lb (68 kg)  Height: 5\' 11"  (1.803 m)    Body mass index is 20.92 kg/m.  General:  Alert and oriented, no acute distress HEENT: Normal Neck: No JVD Cardiac: Regular Rate and Rhythm Abdomen: Soft, non-tender, non-distended, no mass Skin: No rash or ulcer Extremity Pulses:  2+ radial, brachial, femoral, popliteal absent dorsalis pedis, posterior tibial pulses bilaterally Musculoskeletal: No deformity or edema  Neurologic: Upper and lower extremity motor 5/5 and symmetric  DATA:  She had bilateral ABIs performed today. His vessels were calcified and noncompressible making them unreliable. He did have biphasic to monophasic waveforms in the left leg biphasic waveforms in the right leg a toe pressure on the right of 16 left side toe pressure was 38  ASSESSMENT: Peripheral arterial disease bilateral lower extremities currently asymptomatic. Has a palpable popliteal pulse of most likely has tibial occlusive disease.   PLAN: Patient with peripheral arterial disease essentially asymptomatic. In light of the patient's age I will leave it at the discretion of his primary care physician whether or not to start him on a statin or aspirin.  He will follow-up with [place] with a duplex exam and bilateral toe pressures in 6 months. He will follow up with [place] sooner if he has any nonhealing wounds.   Ruta Hinds, MD [facility] Office: [phone] Pager: [phone]

## 2019-10-12 ENCOUNTER — Other Ambulatory Visit: Payer: Self-pay | Admitting: Urology

## 2019-10-12 LAB — PTH, INTACT AND CALCIUM
Calcium, Total (PTH): 9.4 mg/dL (ref 8.6–10.2)
PTH: 12 pg/mL — ABNORMAL LOW (ref 15–65)

## 2019-10-15 ENCOUNTER — Other Ambulatory Visit: Payer: Self-pay | Admitting: *Deleted

## 2019-10-15 DIAGNOSIS — I739 Peripheral vascular disease, unspecified: Secondary | ICD-10-CM

## 2019-10-25 ENCOUNTER — Ambulatory Visit (HOSPITAL_COMMUNITY)
Admission: RE | Admit: 2019-10-25 | Discharge: 2019-10-25 | Disposition: A | Discharge status: Home / Self Care | Payer: Medicare HMO | Source: Ambulatory Visit | Attending: Nephrology | Admitting: Nephrology

## 2019-10-25 ENCOUNTER — Other Ambulatory Visit: Payer: Self-pay

## 2019-10-25 VITALS — BP 132/64 | HR 82 | Temp 97.1°F | Resp 20

## 2019-10-25 DIAGNOSIS — N185 Chronic kidney disease, stage 5: Secondary | ICD-10-CM | POA: Insufficient documentation

## 2019-10-25 LAB — POCT HEMOGLOBIN-HEMACUE: Hemoglobin: 9.3 g/dL — ABNORMAL LOW (ref 13.0–17.0)

## 2019-10-25 MED ORDER — EPOETIN ALFA 20000 UNIT/ML IJ SOLN
20000.0000 [IU] | INTRAMUSCULAR | Status: DC
  Administered 2019-10-25: 20000 [IU] via SUBCUTANEOUS

## 2019-10-25 MED ORDER — EPOETIN ALFA 20000 UNIT/ML IJ SOLN
INTRAMUSCULAR | Status: AC
  Filled 2019-10-25: qty 1

## 2019-10-29 ENCOUNTER — Other Ambulatory Visit: Payer: Self-pay | Admitting: Podiatry

## 2019-10-29 ENCOUNTER — Ambulatory Visit (INDEPENDENT_AMBULATORY_CARE_PROVIDER_SITE_OTHER): Payer: Medicare HMO

## 2019-10-29 ENCOUNTER — Other Ambulatory Visit: Payer: Self-pay

## 2019-10-29 ENCOUNTER — Ambulatory Visit: Payer: Medicare HMO | Admitting: Podiatry

## 2019-10-29 ENCOUNTER — Encounter: Payer: Self-pay | Admitting: Podiatry

## 2019-10-29 VITALS — Temp 98.0°F

## 2019-10-29 DIAGNOSIS — M79675 Pain in left toe(s): Secondary | ICD-10-CM | POA: Diagnosis not present

## 2019-10-29 DIAGNOSIS — B351 Tinea unguium: Secondary | ICD-10-CM | POA: Diagnosis not present

## 2019-10-29 DIAGNOSIS — R52 Pain, unspecified: Secondary | ICD-10-CM

## 2019-10-29 DIAGNOSIS — M7671 Peroneal tendinitis, right leg: Secondary | ICD-10-CM | POA: Diagnosis not present

## 2019-10-29 DIAGNOSIS — M7670 Peroneal tendinitis, unspecified leg: Secondary | ICD-10-CM

## 2019-10-29 DIAGNOSIS — M722 Plantar fascial fibromatosis: Secondary | ICD-10-CM

## 2019-10-29 DIAGNOSIS — M79674 Pain in right toe(s): Secondary | ICD-10-CM | POA: Diagnosis not present

## 2019-10-31 NOTE — Progress Notes (Signed)
Subjective:   Patient ID: Richard Moses, male   DOB: 86 y.o.   MRN: [id number]   HPI Patient presents stating that he knows he has vascular disease that is been diagnosed and he has pain on the bottom of his left heel side of his right foot and its been getting worse over the last few months.  Patient states that at times he has difficulty with walking and patient does not currently smoke and is not currently active   Review of Systems  All other systems reviewed and are negative.       Objective:  Physical Exam Vitals and nursing note reviewed.  Constitutional:      Appearance: He is well-developed.  Pulmonary:     Effort: Pulmonary effort is normal.  Musculoskeletal:        General: Normal range of motion.  Skin:    General: Skin is warm.  Neurological:     Mental Status: He is alert.     Neurovascular status was found to be mildly diminished with patient found to have exquisite discomfort plantar aspect left heel and inflammation pain of the side of the right foot at the peroneal insertion with no indication of muscle strength loss.  There is slow cap fill time bilateral diminished hair growth     Assessment:  Combination of probable moderate vascular disease along with fasciitis left and peroneal tendinitis right     Plan:  H&P all conditions reviewed discussed.  At this point I do want him to do daily inspections and protect his feet and I went ahead and I injected the left plantar fascia 3 mg Dexasone Kenalog 5 mg Xylocaine and for the right I did do sterile prep and injected the peroneal insertion 3 mg dexamethasone 5 mg Xylocaine.  I advised on supportive type shoes anti-inflammatories and patient will be seen back to recheck as needed  X-rays indicate that there is moderate arthritis no indications of stress fracture noted

## 2019-11-08 ENCOUNTER — Encounter (HOSPITAL_COMMUNITY)
Admission: RE | Admit: 2019-11-08 | Discharge: 2019-11-08 | Disposition: A | Discharge status: Home / Self Care | Payer: Medicare HMO | Source: Ambulatory Visit | Attending: Nephrology | Admitting: Nephrology

## 2019-11-08 ENCOUNTER — Other Ambulatory Visit: Payer: Self-pay

## 2019-11-08 VITALS — BP 117/68 | HR 86 | Temp 96.6°F | Resp 20

## 2019-11-08 DIAGNOSIS — N185 Chronic kidney disease, stage 5: Secondary | ICD-10-CM | POA: Diagnosis present

## 2019-11-08 LAB — RENAL FUNCTION PANEL
Albumin: 3.5 g/dL (ref 3.5–5.0)
Anion gap: 14 (ref 5–15)
BUN: 75 mg/dL — ABNORMAL HIGH (ref 8–23)
CO2: 20 mmol/L — ABNORMAL LOW (ref 22–32)
Calcium: 9.5 mg/dL (ref 8.9–10.3)
Chloride: 109 mmol/L (ref 98–111)
Creatinine, Ser: 5.03 mg/dL — ABNORMAL HIGH (ref 0.61–1.24)
GFR calc Af Amer: 11 mL/min — ABNORMAL LOW (ref >60)
GFR calc non Af Amer: 10 mL/min — ABNORMAL LOW (ref >60)
Glucose, Bld: 102 mg/dL — ABNORMAL HIGH (ref 70–99)
Phosphorus: 5 mg/dL — ABNORMAL HIGH (ref 2.5–4.6)
Potassium: 4.1 mmol/L (ref 3.5–5.1)
Sodium: 143 mmol/L (ref 135–145)

## 2019-11-08 LAB — IRON AND TIBC
Iron: 67 ug/dL (ref 45–182)
Saturation Ratios: 24 % (ref 17.9–39.5)
TIBC: 284 ug/dL (ref 250–450)
UIBC: 217 ug/dL

## 2019-11-08 LAB — FERRITIN: Ferritin: 234 ng/mL (ref 24–336)

## 2019-11-08 LAB — POCT HEMOGLOBIN-HEMACUE: Hemoglobin: 9.5 g/dL — ABNORMAL LOW (ref 13.0–17.0)

## 2019-11-08 MED ORDER — EPOETIN ALFA 20000 UNIT/ML IJ SOLN
INTRAMUSCULAR | Status: AC
  Administered 2019-11-08: 20000 [IU] via SUBCUTANEOUS
  Filled 2019-11-08: qty 1

## 2019-11-08 MED ORDER — EPOETIN ALFA 20000 UNIT/ML IJ SOLN: 20000.0000 [IU] | INTRAMUSCULAR | Status: DC

## 2019-11-09 LAB — PTH, INTACT AND CALCIUM
Calcium, Total (PTH): 9.3 mg/dL (ref 8.6–10.2)
PTH: 12 pg/mL — ABNORMAL LOW (ref 15–65)

## 2019-11-13 ENCOUNTER — Encounter (HOSPITAL_BASED_OUTPATIENT_CLINIC_OR_DEPARTMENT_OTHER): Payer: Self-pay | Admitting: Urology

## 2019-11-13 ENCOUNTER — Other Ambulatory Visit: Payer: Self-pay

## 2019-11-13 DIAGNOSIS — R3914 Feeling of incomplete bladder emptying: Secondary | ICD-10-CM | POA: Diagnosis not present

## 2019-11-13 NOTE — Progress Notes (Addendum)
Addendum: spoke with Richard zanetto pa patient meets [facility] guidelines   Spoke w/ via phone for pre-op interview---patient Lab needs dos----   I stat 8, ekg           COVID test ------11-15-2019 at 840 am Arrive at -------530 am 11-19-2019  NPO after ------midnight Medications to take morning of surgery -----none Diabetic medication -----diet controlled dm Patient Special Instructions -----none Pre-Op special Istructions -----none Patient verbalized understanding of instructions that were given at this phone interview. Patient denies shortness of breath, chest pain, fever, cough a this phone interview.  Anesthesia : stage 4 ckd Chart to Janett Billow zanetto pa for review  PCP dr :Pollie Friar Cardiologist :none Nephrology dr patel Cassell Clement 10-11-2019 on chart Chest x-ray :none EKG :none Echo :none Cardiac Cath : none Sleep Study/ CPAP :none Fasting Blood Sugar :      / Checks Blood Sugar -- times a JKD:TOIZ controlled dm does not check cbg   Blood Thinner/ Instructions /Last Dose:n/a ASA / Instructions/ Last Dose : n/a  Patient denies shortness of breath, chest pain, fever, and cough at this phone interview.

## 2019-11-15 ENCOUNTER — Other Ambulatory Visit (HOSPITAL_COMMUNITY)
Admission: RE | Admit: 2019-11-15 | Discharge: 2019-11-15 | Disposition: A | Discharge status: Home / Self Care | Payer: Medicare HMO | Source: Ambulatory Visit | Attending: Urology | Admitting: Urology

## 2019-11-15 DIAGNOSIS — Z20822 Contact with and (suspected) exposure to covid-19: Secondary | ICD-10-CM | POA: Insufficient documentation

## 2019-11-15 DIAGNOSIS — Z01812 Encounter for preprocedural laboratory examination: Secondary | ICD-10-CM | POA: Diagnosis not present

## 2019-11-15 LAB — SARS CORONAVIRUS 2 (TAT 6-24 HRS): SARS Coronavirus 2: NEGATIVE

## 2019-11-16 NOTE — Anesthesia Preprocedure Evaluation (Addendum)
Anesthesia Evaluation  Patient identified by MRN, date of birth, ID band Patient awake    Reviewed: Allergy & Precautions, NPO status , Patient's Chart, lab work & pertinent test results  History of Anesthesia Complications Negative for: history of anesthetic complications  Airway Mallampati: II  TM Distance: >3 FB Neck ROM: Full    Dental no notable dental hx.    Pulmonary neg pulmonary ROS, former smoker,    Pulmonary exam normal        Cardiovascular hypertension, + Peripheral Vascular Disease  Normal cardiovascular exam     Neuro/Psych negative neurological ROS  negative psych ROS   GI/Hepatic Neg liver ROS, GERD  ,  Endo/Other  diabetes, Type 2Hypothyroidism   Renal/GU CRFRenal disease (Cr 5.03, renal stones)  negative genitourinary   Musculoskeletal  (+) Arthritis ,   Abdominal   Peds  Hematology  (+) anemia ,   Anesthesia Other Findings Day of surgery medications reviewed with patient.  Reproductive/Obstetrics negative OB ROS                            Anesthesia Physical Anesthesia Plan  ASA: III  Anesthesia Plan: General   Post-op Pain Management:    Induction: Intravenous  PONV Risk Score and Plan: 2 and Treatment may vary due to age or medical condition and Ondansetron  Airway Management Planned: LMA  Additional Equipment: None  Intra-op Plan:   Post-operative Plan: Extubation in OR  Informed Consent: I have reviewed the patients History and Physical, chart, labs and discussed the procedure including the risks, benefits and alternatives for the proposed anesthesia with the patient or authorized representative who has indicated his/her understanding and acceptance.     Dental advisory given  Plan Discussed with: CRNA  Anesthesia Plan Comments:        Anesthesia Quick Evaluation

## 2019-11-19 ENCOUNTER — Ambulatory Visit (HOSPITAL_BASED_OUTPATIENT_CLINIC_OR_DEPARTMENT_OTHER): Payer: Medicare HMO | Admitting: Physician Assistant

## 2019-11-19 ENCOUNTER — Other Ambulatory Visit: Payer: Self-pay

## 2019-11-19 ENCOUNTER — Ambulatory Visit (HOSPITAL_BASED_OUTPATIENT_CLINIC_OR_DEPARTMENT_OTHER)
Admission: RE | Admit: 2019-11-19 | Discharge: 2019-11-19 | Disposition: A | Discharge status: Home / Self Care | Payer: Medicare HMO | Attending: Urology | Admitting: Urology

## 2019-11-19 ENCOUNTER — Encounter (HOSPITAL_BASED_OUTPATIENT_CLINIC_OR_DEPARTMENT_OTHER): Payer: Self-pay | Admitting: Urology

## 2019-11-19 ENCOUNTER — Encounter (HOSPITAL_BASED_OUTPATIENT_CLINIC_OR_DEPARTMENT_OTHER)
Admission: RE | Disposition: A | Discharge status: Home / Self Care | Payer: Self-pay | Source: Home / Self Care | Attending: Urology

## 2019-11-19 DIAGNOSIS — I120 Hypertensive chronic kidney disease with stage 5 chronic kidney disease or end stage renal disease: Secondary | ICD-10-CM | POA: Diagnosis not present

## 2019-11-19 DIAGNOSIS — N185 Chronic kidney disease, stage 5: Secondary | ICD-10-CM | POA: Insufficient documentation

## 2019-11-19 DIAGNOSIS — N210 Calculus in bladder: Secondary | ICD-10-CM | POA: Diagnosis not present

## 2019-11-19 DIAGNOSIS — E1151 Type 2 diabetes mellitus with diabetic peripheral angiopathy without gangrene: Secondary | ICD-10-CM | POA: Diagnosis not present

## 2019-11-19 DIAGNOSIS — E1136 Type 2 diabetes mellitus with diabetic cataract: Secondary | ICD-10-CM | POA: Insufficient documentation

## 2019-11-19 DIAGNOSIS — Z87891 Personal history of nicotine dependence: Secondary | ICD-10-CM | POA: Insufficient documentation

## 2019-11-19 DIAGNOSIS — E1122 Type 2 diabetes mellitus with diabetic chronic kidney disease: Secondary | ICD-10-CM | POA: Diagnosis not present

## 2019-11-19 DIAGNOSIS — M1990 Unspecified osteoarthritis, unspecified site: Secondary | ICD-10-CM | POA: Insufficient documentation

## 2019-11-19 DIAGNOSIS — D631 Anemia in chronic kidney disease: Secondary | ICD-10-CM | POA: Diagnosis not present

## 2019-11-19 DIAGNOSIS — N400 Enlarged prostate without lower urinary tract symptoms: Secondary | ICD-10-CM | POA: Diagnosis not present

## 2019-11-19 HISTORY — DX: Unspecified hydronephrosis: N13.30

## 2019-11-19 HISTORY — DX: Plantar fascial fibromatosis: M72.2

## 2019-11-19 HISTORY — PX: CYSTOSCOPY WITH LITHOLAPAXY: SHX1425

## 2019-11-19 HISTORY — DX: Benign prostatic hyperplasia without lower urinary tract symptoms: N40.0

## 2019-11-19 HISTORY — DX: Polyneuropathy, unspecified: G62.9

## 2019-11-19 HISTORY — DX: Secondary hyperparathyroidism of renal origin: N25.81

## 2019-11-19 HISTORY — DX: Personal history of other diseases of the circulatory system: Z86.79

## 2019-11-19 HISTORY — DX: Elevated prostate specific antigen (PSA): R97.20

## 2019-11-19 LAB — POCT I-STAT, CHEM 8
BUN: 68 mg/dL — ABNORMAL HIGH (ref 8–23)
Calcium, Ion: 1.14 mmol/L — ABNORMAL LOW (ref 1.15–1.40)
Chloride: 113 mmol/L — ABNORMAL HIGH (ref 98–111)
Creatinine, Ser: 4.9 mg/dL — ABNORMAL HIGH (ref 0.61–1.24)
Glucose, Bld: 93 mg/dL (ref 70–99)
HCT: 48 % (ref 39.0–52.0)
Hemoglobin: 16.3 g/dL (ref 13.0–17.0)
Potassium: 3.8 mmol/L (ref 3.5–5.1)
Sodium: 145 mmol/L (ref 135–145)
TCO2: 18 mmol/L — ABNORMAL LOW (ref 22–32)

## 2019-11-19 LAB — GLUCOSE, CAPILLARY: Glucose-Capillary: 82 mg/dL (ref 70–99)

## 2019-11-19 SURGERY — CYSTOSCOPY, WITH BLADDER CALCULUS LITHOLAPAXY
Anesthesia: General | Site: Bladder

## 2019-11-19 MED ORDER — FENTANYL CITRATE (PF) 100 MCG/2ML IJ SOLN
INTRAMUSCULAR | Status: DC | PRN
  Administered 2019-11-19: 50 ug via INTRAVENOUS
  Administered 2019-11-19 (×2): 25 ug via INTRAVENOUS

## 2019-11-19 MED ORDER — LIDOCAINE 2% (20 MG/ML) 5 ML SYRINGE
INTRAMUSCULAR | Status: AC
  Filled 2019-11-19: qty 5

## 2019-11-19 MED ORDER — ONDANSETRON HCL 4 MG/2ML IJ SOLN
INTRAMUSCULAR | Status: DC | PRN
  Administered 2019-11-19: 4 mg via INTRAVENOUS

## 2019-11-19 MED ORDER — FENTANYL CITRATE (PF) 100 MCG/2ML IJ SOLN: 25.0000 ug | INTRAMUSCULAR | Status: DC | PRN

## 2019-11-19 MED ORDER — PROPOFOL 10 MG/ML IV BOLUS
INTRAVENOUS | Status: DC | PRN
  Administered 2019-11-19: 110 mg via INTRAVENOUS
  Administered 2019-11-19: 50 mg via INTRAVENOUS

## 2019-11-19 MED ORDER — OXYCODONE HCL 5 MG PO TABS: 5.0000 mg | ORAL_TABLET | Freq: Once | ORAL | Status: DC | PRN

## 2019-11-19 MED ORDER — LIDOCAINE 2% (20 MG/ML) 5 ML SYRINGE
INTRAMUSCULAR | Status: DC | PRN
  Administered 2019-11-19: 100 mg via INTRAVENOUS

## 2019-11-19 MED ORDER — SODIUM CHLORIDE 0.9 % IV SOLN: INTRAVENOUS | Status: DC

## 2019-11-19 MED ORDER — CEFAZOLIN SODIUM-DEXTROSE 2-4 GM/100ML-% IV SOLN
INTRAVENOUS | Status: AC
  Filled 2019-11-19: qty 100

## 2019-11-19 MED ORDER — SODIUM CHLORIDE 0.9 % IR SOLN
Status: DC | PRN
  Administered 2019-11-19 (×3): 3000 mL via INTRAVESICAL

## 2019-11-19 MED ORDER — PROMETHAZINE HCL 25 MG/ML IJ SOLN: 6.2500 mg | INTRAMUSCULAR | Status: DC | PRN

## 2019-11-19 MED ORDER — CEFAZOLIN SODIUM-DEXTROSE 2-4 GM/100ML-% IV SOLN
2.0000 g | INTRAVENOUS | Status: AC
  Administered 2019-11-19: 2 g via INTRAVENOUS

## 2019-11-19 MED ORDER — ACETAMINOPHEN 500 MG PO TABS
ORAL_TABLET | ORAL | Status: AC
  Filled 2019-11-19: qty 2

## 2019-11-19 MED ORDER — OXYCODONE HCL 5 MG/5ML PO SOLN: 5.0000 mg | Freq: Once | ORAL | Status: DC | PRN

## 2019-11-19 MED ORDER — FENTANYL CITRATE (PF) 100 MCG/2ML IJ SOLN
INTRAMUSCULAR | Status: AC
  Filled 2019-11-19: qty 2

## 2019-11-19 MED ORDER — PROPOFOL 10 MG/ML IV BOLUS
INTRAVENOUS | Status: AC
  Filled 2019-11-19: qty 40

## 2019-11-19 MED ORDER — ACETAMINOPHEN 500 MG PO TABS
1000.0000 mg | ORAL_TABLET | Freq: Once | ORAL | Status: AC
  Administered 2019-11-19: 1000 mg via ORAL

## 2019-11-19 SURGICAL SUPPLY — 26 items
BAG DRAIN URO-CYSTO SKYTR STRL (DRAIN) ×2 IMPLANT
BAG DRN RND TRDRP ANRFLXCHMBR (UROLOGICAL SUPPLIES) ×1
BAG DRN UROCATH (DRAIN) ×1
BAG URINE DRAIN 2000ML AR STRL (UROLOGICAL SUPPLIES) ×1 IMPLANT
CATH FOLEY 2WAY SLVR  5CC 20FR (CATHETERS) ×2
CATH FOLEY 2WAY SLVR 5CC 20FR (CATHETERS) IMPLANT
CLOTH BEACON ORANGE TIMEOUT ST (SAFETY) ×2 IMPLANT
FIBER LASER FLEXIVA 1000 (UROLOGICAL SUPPLIES) ×1 IMPLANT
FIBER LASER FLEXIVA 550 (UROLOGICAL SUPPLIES) IMPLANT
GLOVE BIO SURGEON STRL SZ 6.5 (GLOVE) ×2 IMPLANT
GLOVE BIO SURGEON STRL SZ7.5 (GLOVE) ×2 IMPLANT
GOWN STRL REUS W/ TWL LRG LVL3 (GOWN DISPOSABLE) IMPLANT
GOWN STRL REUS W/TWL LRG LVL3 (GOWN DISPOSABLE) ×2
GOWN STRL REUS W/TWL XL LVL3 (GOWN DISPOSABLE) ×2 IMPLANT
HOLDER FOLEY CATH W/STRAP (MISCELLANEOUS) ×1 IMPLANT
IV NS IRRIG 3000ML ARTHROMATIC (IV SOLUTION) ×4 IMPLANT
KIT TURNOVER CYSTO (KITS) ×2 IMPLANT
MANIFOLD NEPTUNE II (INSTRUMENTS) ×2 IMPLANT
NS IRRIG 500ML POUR BTL (IV SOLUTION) ×2 IMPLANT
SYR 10ML LL (SYRINGE) ×2 IMPLANT
SYR TOOMEY IRRIG 70ML (MISCELLANEOUS) ×2
SYRINGE TOOMEY IRRIG 70ML (MISCELLANEOUS) IMPLANT
TRAY CYSTO PACK (CUSTOM PROCEDURE TRAY) ×2 IMPLANT
TUBE CONNECTING 12X1/4 (SUCTIONS) ×1 IMPLANT
TUBING UROLOGY SET (TUBING) ×2 IMPLANT
WATER STERILE IRR 3000ML UROMA (IV SOLUTION) IMPLANT

## 2019-11-19 NOTE — H&P (Signed)
H&P  Chief Complaint: Bladder calculi  History of Present Illness: 86 year old male with chronic indwelling catheter and bladder calculi presents for cystoscopy lithotripsy Past Medical History:  Diagnosis Date  . Abnormal MRI, cervical spine 09/29/10   Mildly abnormal MRI cervical spine demonstrating mild spondylosis and disc bulging from C4-5 down to C7-T1.  No spinal stenosis or foraminal narrowing  . Anemia   . Arthritis   . Blood transfusion without reported diagnosis   . BPH (benign prostatic hyperplasia)   . Cancer [facility])    possible melanoma on lt.leg  . Cataract    beginning stage  . Cervical disc herniation 09/29/10   C4-C5 and C7-T1  . Chronic kidney disease (CKD)    stage 4 per dr patel nephrology lov 10-11-2019  . Chronic left-sided headaches   . Colon polyp 2007  . Diabetes mellitus    diet dontrolled  . Elevated PSA   . External hemorrhoids without mention of complication 5852  . GERD (gastroesophageal reflux disease)   . History of hypertension    no current meds for  . Hydronephrosis    acute renal failure superimposed on stage 4 ckd  . Hyperlipidemia   . Hypothyroidism 09/29/10   Untreated. Patient not interested in Rx.   . Intermittent self-catheterization of bladder    placed every month, foley in all the time  . Knee pain, bilateral 02/05/2013   Pain since a fall in 2014  . MRI of brain abnormal 09/29/10   Abnormal MRI of brain, demonstrating mild atrophy  . Neck pain on left side 2012  . Neuropathy   . Plantar fasciitis    left foot  . Secondary hyperparathyroidism [facility])    Past Surgical History:  Procedure Laterality Date  . COLONOSCOPY    . left knee melanoma removed  2018  . MELANOMA EXCISION     left side of neck  . POLYPECTOMY    . RHINOPLASTY     x2     Home Medications:  Medications Prior to Admission  Medication Sig Dispense Refill Last Dose  . ascorbic acid (VITAMIN C) 500 MG tablet Take by mouth.   Past Month at Unknown time  .  Barberry-Oreg Grape-Goldenseal (BERBERINE COMPLEX PO) Take 500 mg by mouth 3 (three) times daily with meals. Reported on 12/11/2015   Past Month at Unknown time  . Cholecalciferol (VITAMIN D3) 10 MCG (400 UNIT) tablet Take by mouth.   Past Month at Unknown time  . Epoetin Alfa (PROCRIT IJ) Inject as directed every 14 (fourteen) days.   Past Month at Unknown time  . Multiple Vitamins-Minerals (MENS MULTIVITAMIN PLUS) TABS Take 1 tablet by mouth daily after breakfast.   Past Month at Unknown time  . Omega-3 Fatty Acids (FISH OIL PO) Take by mouth daily.   Past Month at Unknown time   Allergies: No Known Allergies  Family History  Problem Relation Age of Onset  . Cancer Father 17       Renal CA  . Cancer Sister        Brain   . Cancer Brother        Brain  . Colon cancer Neg Hx   . Stomach cancer Neg Hx   . Colon polyps Neg Hx   . Esophageal cancer Neg Hx   . Rectal cancer Neg Hx    Social History:  reports that he quit smoking about 52 years ago. His smoking use included cigarettes. He has never used smokeless tobacco. He reports  previous alcohol use. He reports that he does not use drugs.  ROS: A complete review of systems was performed.  All systems are negative except for pertinent findings as noted. ROS   Physical Exam:  Vital signs in last 24 hours: Temp:  [98.4 F (36.9 C)] 98.4 F (36.9 C) (06/14 0617) Pulse Rate:  [75] 75 (06/14 0617) Resp:  [14] 14 (06/14 0617) BP: (145)/(75) 145/75 (06/14 0617) SpO2:  [100 %] 100 % (06/14 0617) Weight:  [67.2 kg] 67.2 kg (06/14 0617) General:  Alert and oriented, No acute distress HEENT: Normocephalic, atraumatic Neck: No JVD or lymphadenopathy Cardiovascular: Regular rate and rhythm Lungs: Regular rate and effort Abdomen: Soft, nontender, nondistended, no abdominal masses Back: No CVA tenderness Extremities: No edema Neurologic: Grossly intact  Laboratory Data:  Results for orders placed or performed during the hospital  encounter of 11/19/19 (from the past 24 hour(s))  I-STAT, chem 8     Status: Abnormal   Collection Time: 11/19/19  6:50 AM  Result Value Ref Range   Sodium 145 135 - 145 mmol/L   Potassium 3.8 3.5 - 5.1 mmol/L   Chloride 113 (H) 98 - 111 mmol/L   BUN 68 (H) 8 - 23 mg/dL   Creatinine, Ser 4.90 (H) 0.61 - 1.24 mg/dL   Glucose, Bld 93 70 - 99 mg/dL   Calcium, Ion 1.14 (L) 1.15 - 1.40 mmol/L   TCO2 18 (L) 22 - 32 mmol/L   Hemoglobin 16.3 13.0 - 17.0 g/dL   HCT 48.0 39 - 52 %   Recent Results (from the past 240 hour(s))  SARS CORONAVIRUS 2 (TAT 6-24 HRS) Nasopharyngeal Nasopharyngeal Swab     Status: None   Collection Time: 11/15/19  8:33 AM   Specimen: Nasopharyngeal Swab  Result Value Ref Range Status   SARS Coronavirus 2 NEGATIVE NEGATIVE Final    Comment: (NOTE) SARS-CoV-2 target nucleic acids are NOT DETECTED.  The SARS-CoV-2 RNA is generally detectable in upper and lower respiratory specimens during the acute phase of infection. Negative results do not preclude SARS-CoV-2 infection, do not rule out co-infections with other pathogens, and should not be used as the sole basis for treatment or other patient management decisions. Negative results must be combined with clinical observations, patient history, and epidemiological information. The expected result is Negative.  Fact Sheet for Patients: SugarRoll.be  Fact Sheet for Healthcare Providers: https://www.woods-mathews.com/  This test is not yet approved or cleared by the [place] FDA and  has been authorized for detection and/or diagnosis of SARS-CoV-2 by FDA under an Emergency Use Authorization (EUA). This EUA will remain  in effect (meaning this test can be used) for the duration of the COVID-19 declaration under Se ction 564(b)(1) of the Act, 21 U.S.C. section 360bbb-3(b)(1), unless the authorization is terminated or revoked sooner.  Performed at [facility],  [place] [address], [place], [place] [address]    Creatinine: Recent Labs    11/19/19 2426  CREATININE 4.90*    Impression/Assessment:  Bladder calculi  Plan:  Proceed with cystoscopy lithotripsy of bladder stones  Marton Redwood, III 11/19/2019, 8:27 AM

## 2019-11-19 NOTE — Care Plan (Signed)
Foley removed at 0900, 200 ml yellow urine discarded

## 2019-11-19 NOTE — Op Note (Signed)
Operative Note  Preoperative diagnosis:  1.  Bladder calculi  Postoperative diagnosis: 1.  Bladder calculi, less than 2.5 cm  Procedure(s): 1.  Cystolitholapaxy, less than 2.5 cm  Surgeon: Link Snuffer, MD  Assistants: None  Anesthesia: General  Complications: None immediate  EBL: Minimal  Specimens: 1.  None  Drains/Catheters: 1.  20 French Foley catheter  Intraoperative findings: 1.  Normal anterior urethra 2.  Obstructing prostate with large median lobe.  There was some trabeculation.  2 bladder stones were fragmented  Indication: 86 year old male with chronic Foley catheter and bladder stones presents for the previously mentioned operation  Description of procedure:  The patient was identified and consent was obtained.  The patient was taken to the operating room and placed in the supine position.  The patient was placed under general anesthesia.  Perioperative antibiotics were administered.  The patient was placed in dorsal lithotomy.  Patient was prepped and draped in a standard sterile fashion and a timeout was performed.  A 21 French scope was inserted into the urethra and into the bladder.  The stones of interest were encountered and were fragmented to tiny fragments.  All fragments were evacuated.  I placed a 80 French Foley catheter and this concluded the operation.  Patient tolerated the procedure well and was stable postoperatively.  Plan: Continue monthly catheter changes

## 2019-11-19 NOTE — Discharge Instructions (Signed)
Indwelling Urinary Catheter Care, Adult An indwelling urinary catheter is a thin tube that is put into your bladder. The tube helps to drain pee (urine) out of your body. The tube goes in through your urethra. Your urethra is where pee comes out of your body. Your pee will come out through the catheter, then it will go into a bag (drainage bag). Take good care of your catheter so it will work well. How to wear your catheter and bag Supplies needed  Sticky tape (adhesive tape) or a leg strap.  Alcohol wipe or soap and water (if you use tape).  A clean towel (if you use tape).  Large overnight bag.  Smaller bag (leg bag). Wearing your catheter Attach your catheter to your leg with tape or a leg strap.  Make sure the catheter is not pulled tight.  If a leg strap gets wet, take it off and put on a dry strap.  If you use tape to hold the bag on your leg: 1. Use an alcohol wipe or soap and water to wash your skin where the tape made it sticky before. 2. Use a clean towel to pat-dry that skin. 3. Use new tape to make the bag stay on your leg. Wearing your bags You should have been given a large overnight bag.  You may wear the overnight bag in the day or night.  Always have the overnight bag lower than your bladder.  Do not let the bag touch the floor.  Before you go to sleep, put a clean plastic bag in a wastebasket. Then hang the overnight bag inside the wastebasket. You should also have a smaller leg bag that fits under your clothes.  Always wear the leg bag below your knee.  Do not wear your leg bag at night. How to care for your skin and catheter Supplies needed  A clean washcloth.  Water and mild soap.  A clean towel. Caring for your skin and catheter      Clean the skin around your catheter every day: 1. Wash your hands with soap and water. 2. Wet a clean washcloth in warm water and mild soap. 3. Clean the skin around your urethra.  If you are  male:  Gently spread the folds of skin around your vagina (labia).  With the washcloth in your other hand, wipe the inner side of your labia on each side. Wipe from front to back.  If you are male:  Pull back any skin that covers the end of your penis (foreskin).  With the washcloth in your other hand, wipe your penis in small circles. Start wiping at the tip of your penis, then move away from the catheter.  Move the foreskin back in place, if needed. 4. With your free hand, hold the catheter close to where it goes into your body.  Keep holding the catheter during cleaning so it does not get pulled out. 5. With the washcloth in your other hand, clean the catheter.  Only wipe downward on the catheter.  Do not wipe upward toward your body. Doing this may push germs into your urethra and cause infection. 6. Use a clean towel to pat-dry the catheter and the skin around it. Make sure to wipe off all soap. 7. Wash your hands with soap and water.  Shower every day. Do not take baths.  Do not use cream, ointment, or lotion on the area where the catheter goes into your body, unless your doctor tells you   to.  Do not use powders, sprays, or lotions on your genital area.  Check your skin around the catheter every day for signs of infection. Check for: ? Redness, swelling, or pain. ? Fluid or blood. ? Warmth. ? Pus or a bad smell. How to empty the bag Supplies needed  Rubbing alcohol.  Gauze pad or cotton ball.  Tape or a leg strap. Emptying the bag Pour the pee out of your bag when it is ?- full, or at least 2-3 times a day. Do this for your overnight bag and your leg bag. 1. Wash your hands with soap and water. 2. Separate (detach) the bag from your leg. 3. Hold the bag over the toilet or a clean pail. Keep the bag lower than your hips and bladder. This is so the pee (urine) does not go back into the tube. 4. Open the pour spout. It is at the bottom of the bag. 5. Empty the  pee into the toilet or pail. Do not let the pour spout touch any surface. 6. Put rubbing alcohol on a gauze pad or cotton ball. 7. Use the gauze pad or cotton ball to clean the pour spout. 8. Close the pour spout. 9. Attach the bag to your leg with tape or a leg strap. 10. Wash your hands with soap and water. Follow instructions for cleaning the drainage bag:  From the product maker.  As told by your doctor. How to change the bag Supplies needed  Alcohol wipes.  A clean bag.  Tape or a leg strap. Changing the bag Replace your bag when it starts to leak, smell bad, or look dirty. 1. Wash your hands with soap and water. 2. Separate the dirty bag from your leg. 3. Pinch the catheter with your fingers so that pee does not spill out. 4. Separate the catheter tube from the bag tube where these tubes connect (at the connection valve). Do not let the tubes touch any surface. 5. Clean the end of the catheter tube with an alcohol wipe. Use a different alcohol wipe to clean the end of the bag tube. 6. Connect the catheter tube to the tube of the clean bag. 7. Attach the clean bag to your leg with tape or a leg strap. Do not make the bag tight on your leg. 8. Wash your hands with soap and water. General rules   Never pull on your catheter. Never try to take it out. Doing that can hurt you.  Always wash your hands before and after you touch your catheter or bag. Use a mild, fragrance-free soap. If you do not have soap and water, use hand sanitizer.  Always make sure there are no twists or bends (kinks) in the catheter tube.  Always make sure there are no leaks in the catheter or bag.  Drink enough fluid to keep your pee pale yellow.  Do not take baths, swim, or use a hot tub.  If you are male, wipe from front to back after you poop (have a bowel movement). Contact a doctor if:  Your pee is cloudy.  Your pee smells worse than usual.  Your catheter gets clogged.  Your catheter  leaks.  Your bladder feels full. Get help right away if:  You have redness, swelling, or pain where the catheter goes into your body.  You have fluid, blood, pus, or a bad smell coming from the area where the catheter goes into your body.  Your skin feels warm where   the catheter goes into your body.  You have a fever.  You have pain in your: ? Belly (abdomen). ? Legs. ? Lower back. ? Bladder.  You see blood in the catheter.  Your pee is pink or red.  You feel sick to your stomach (nauseous).  You throw up (vomit).  You have chills.  Your pee is not draining into the bag.  Your catheter gets pulled out. Summary  An indwelling urinary catheter is a thin tube that is placed into the bladder to help drain pee (urine) out of the body.  The catheter is placed into the part of the body that drains pee from the bladder (urethra).  Taking good care of your catheter will keep it working properly and help prevent problems.  Always wash your hands before and after touching your catheter or bag.  Never pull on your catheter or try to take it out. This information is not intended to replace advice given to you by your health care provider. Make sure you discuss any questions you have with your health care provider. Document Revised: 09/15/2018 Document Reviewed: 01/07/2017 Elsevier Patient Education  2020 [employer]   Post Anesthesia Home Care Instructions  Activity: Get plenty of rest for the remainder of the day. A responsible individual must stay with you for 24 hours following the procedure.  For the next 24 hours, DO NOT: -Drive a car -Operate machinery -Drink alcoholic beverages -Take any medication unless instructed by your physician -Make any legal decisions or sign important papers.  Meals: Start with liquid foods such as gelatin or soup. Progress to regular foods as tolerated. Avoid greasy, spicy, heavy foods. If nausea and/or vomiting occur, drink only  clear liquids until the nausea and/or vomiting subsides. Call your physician if vomiting continues.  Special Instructions/Symptoms: Your throat may feel dry or sore from the anesthesia or the breathing tube placed in your throat during surgery. If this causes discomfort, gargle with warm salt water. The discomfort should disappear within 24 hours.   

## 2019-11-19 NOTE — Anesthesia Procedure Notes (Signed)
Procedure Name: LMA Insertion Date/Time: 11/19/2019 8:58 AM Performed by: Lollie Sails, CRNA Pre-anesthesia Checklist: Patient identified, Emergency Drugs available, Suction available, Patient being monitored and Timeout performed Patient Re-evaluated:Patient Re-evaluated prior to induction Oxygen Delivery Method: Circle system utilized Preoxygenation: Pre-oxygenation with 100% oxygen Induction Type: IV induction Ventilation: Mask ventilation without difficulty LMA: LMA inserted LMA Size: 5.0 Number of attempts: 2 Placement Confirmation: positive ETCO2 and breath sounds checked- equal and bilateral Tube secured with: Tape Dental Injury: Teeth and Oropharynx as per pre-operative assessment

## 2019-11-19 NOTE — Anesthesia Postprocedure Evaluation (Signed)
Anesthesia Post Note  Patient: Richard Moses  Procedure(s) Performed: CYSTOSCOPY WITH LITHOLAPAXY (N/A Bladder)     Patient location during evaluation: PACU Anesthesia Type: General Level of consciousness: awake and alert and oriented Pain management: pain level controlled Vital Signs Assessment: post-procedure vital signs reviewed and stable Respiratory status: spontaneous breathing, nonlabored ventilation and respiratory function stable Cardiovascular status: blood pressure returned to baseline Postop Assessment: no apparent nausea or vomiting Anesthetic complications: no   No complications documented.  Last Vitals:  Vitals:   11/19/19 1000 11/19/19 1015  BP: (!) 148/69 140/70  Pulse: 69 66  Resp: 12 14  Temp:    SpO2: 100% 100%    Last Pain:  Vitals:   11/19/19 1015  TempSrc:   PainSc: 0-No pain                 Brennan Bailey

## 2019-11-19 NOTE — Transfer of Care (Signed)
Immediate Anesthesia Transfer of Care Note  Patient: Richard Moses  Procedure(s) Performed: CYSTOSCOPY WITH LITHOLAPAXY (N/A Bladder)  Patient Location: PACU  Anesthesia Type:General  Level of Consciousness: awake, alert  and patient cooperative  Airway & Oxygen Therapy: Patient Spontanous Breathing and Patient connected to nasal cannula oxygen  Post-op Assessment: Report given to RN and Post -op Vital signs reviewed and stable  Post vital signs: Reviewed and stable  Last Vitals:  Vitals Value Taken Time  BP 136/80 11/19/19 0949  Temp    Pulse 74 11/19/19 0950  Resp 11 11/19/19 0950  SpO2 100 % 11/19/19 0950  Vitals shown include unvalidated device data.  Last Pain:  Vitals:   11/19/19 0617  TempSrc: Oral  PainSc: 3       Patients Stated Pain Goal: 5 (21/11/73 5670)  Complications: No complications documented.

## 2019-11-20 ENCOUNTER — Encounter (HOSPITAL_BASED_OUTPATIENT_CLINIC_OR_DEPARTMENT_OTHER): Payer: Self-pay | Admitting: Urology

## 2019-11-21 DIAGNOSIS — R338 Other retention of urine: Secondary | ICD-10-CM | POA: Diagnosis not present

## 2019-11-21 DIAGNOSIS — N318 Other neuromuscular dysfunction of bladder: Secondary | ICD-10-CM | POA: Diagnosis not present

## 2019-11-22 ENCOUNTER — Other Ambulatory Visit: Payer: Self-pay

## 2019-11-22 ENCOUNTER — Ambulatory Visit (HOSPITAL_COMMUNITY)
Admission: RE | Admit: 2019-11-22 | Discharge: 2019-11-22 | Disposition: A | Discharge status: Home / Self Care | Payer: Medicare HMO | Source: Ambulatory Visit | Attending: Nephrology | Admitting: Nephrology

## 2019-11-22 VITALS — BP 138/72 | HR 91 | Temp 97.0°F | Resp 18

## 2019-11-22 DIAGNOSIS — N185 Chronic kidney disease, stage 5: Secondary | ICD-10-CM | POA: Diagnosis not present

## 2019-11-22 LAB — POCT HEMOGLOBIN-HEMACUE: Hemoglobin: 9.8 g/dL — ABNORMAL LOW (ref 13.0–17.0)

## 2019-11-22 MED ORDER — EPOETIN ALFA 20000 UNIT/ML IJ SOLN: 20000.0000 [IU] | INTRAMUSCULAR | Status: DC

## 2019-11-22 MED ORDER — EPOETIN ALFA 20000 UNIT/ML IJ SOLN
INTRAMUSCULAR | Status: AC
  Administered 2019-11-22: 20000 [IU]
  Filled 2019-11-22: qty 1

## 2019-12-06 ENCOUNTER — Other Ambulatory Visit: Payer: Self-pay

## 2019-12-06 ENCOUNTER — Encounter (HOSPITAL_COMMUNITY)
Admission: RE | Admit: 2019-12-06 | Discharge: 2019-12-06 | Disposition: A | Discharge status: Home / Self Care | Payer: Medicare HMO | Source: Ambulatory Visit | Attending: Nephrology | Admitting: Nephrology

## 2019-12-06 VITALS — BP 125/76 | HR 92 | Resp 16

## 2019-12-06 DIAGNOSIS — N185 Chronic kidney disease, stage 5: Secondary | ICD-10-CM | POA: Insufficient documentation

## 2019-12-06 LAB — RENAL FUNCTION PANEL
Albumin: 3.5 g/dL (ref 3.5–5.0)
Anion gap: 12 (ref 5–15)
BUN: 67 mg/dL — ABNORMAL HIGH (ref 8–23)
CO2: 20 mmol/L — ABNORMAL LOW (ref 22–32)
Calcium: 9.7 mg/dL (ref 8.9–10.3)
Chloride: 109 mmol/L (ref 98–111)
Creatinine, Ser: 4.58 mg/dL — ABNORMAL HIGH (ref 0.61–1.24)
GFR calc Af Amer: 13 mL/min — ABNORMAL LOW (ref >60)
GFR calc non Af Amer: 11 mL/min — ABNORMAL LOW (ref >60)
Glucose, Bld: 104 mg/dL — ABNORMAL HIGH (ref 70–99)
Phosphorus: 4.7 mg/dL — ABNORMAL HIGH (ref 2.5–4.6)
Potassium: 4 mmol/L (ref 3.5–5.1)
Sodium: 141 mmol/L (ref 135–145)

## 2019-12-06 LAB — IRON AND TIBC
Iron: 88 ug/dL (ref 45–182)
Saturation Ratios: 30 % (ref 17.9–39.5)
TIBC: 297 ug/dL (ref 250–450)
UIBC: 209 ug/dL

## 2019-12-06 LAB — FERRITIN: Ferritin: 157 ng/mL (ref 24–336)

## 2019-12-06 LAB — POCT HEMOGLOBIN-HEMACUE: Hemoglobin: 10.9 g/dL — ABNORMAL LOW (ref 13.0–17.0)

## 2019-12-06 MED ORDER — EPOETIN ALFA 20000 UNIT/ML IJ SOLN
INTRAMUSCULAR | Status: AC
  Filled 2019-12-06: qty 1

## 2019-12-06 MED ORDER — EPOETIN ALFA 20000 UNIT/ML IJ SOLN
20000.0000 [IU] | INTRAMUSCULAR | Status: DC
  Administered 2019-12-06: 20000 [IU] via SUBCUTANEOUS

## 2019-12-07 LAB — PTH, INTACT AND CALCIUM
Calcium, Total (PTH): 9.6 mg/dL (ref 8.6–10.2)
PTH: 11 pg/mL — ABNORMAL LOW (ref 15–65)

## 2019-12-11 DIAGNOSIS — R3914 Feeling of incomplete bladder emptying: Secondary | ICD-10-CM | POA: Diagnosis not present

## 2019-12-14 DIAGNOSIS — I120 Hypertensive chronic kidney disease with stage 5 chronic kidney disease or end stage renal disease: Secondary | ICD-10-CM | POA: Diagnosis not present

## 2019-12-14 DIAGNOSIS — N185 Chronic kidney disease, stage 5: Secondary | ICD-10-CM | POA: Diagnosis not present

## 2019-12-14 DIAGNOSIS — N2581 Secondary hyperparathyroidism of renal origin: Secondary | ICD-10-CM | POA: Diagnosis not present

## 2019-12-14 DIAGNOSIS — D631 Anemia in chronic kidney disease: Secondary | ICD-10-CM | POA: Diagnosis not present

## 2019-12-19 DIAGNOSIS — R338 Other retention of urine: Secondary | ICD-10-CM | POA: Diagnosis not present

## 2019-12-19 DIAGNOSIS — N210 Calculus in bladder: Secondary | ICD-10-CM | POA: Diagnosis not present

## 2019-12-19 DIAGNOSIS — R310 Gross hematuria: Secondary | ICD-10-CM | POA: Diagnosis not present

## 2019-12-20 ENCOUNTER — Other Ambulatory Visit: Payer: Self-pay

## 2019-12-20 ENCOUNTER — Encounter (HOSPITAL_COMMUNITY)
Admission: RE | Admit: 2019-12-20 | Discharge: 2019-12-20 | Disposition: A | Discharge status: Home / Self Care | Payer: Medicare HMO | Source: Ambulatory Visit | Attending: Nephrology | Admitting: Nephrology

## 2019-12-20 VITALS — BP 122/74 | HR 99 | Temp 97.5°F | Resp 20

## 2019-12-20 DIAGNOSIS — N185 Chronic kidney disease, stage 5: Secondary | ICD-10-CM | POA: Diagnosis not present

## 2019-12-20 LAB — POCT HEMOGLOBIN-HEMACUE: Hemoglobin: 12.1 g/dL — ABNORMAL LOW (ref 13.0–17.0)

## 2019-12-20 MED ORDER — EPOETIN ALFA 20000 UNIT/ML IJ SOLN: 20000.0000 [IU] | INTRAMUSCULAR | Status: DC

## 2020-01-03 ENCOUNTER — Other Ambulatory Visit: Payer: Self-pay

## 2020-01-03 ENCOUNTER — Ambulatory Visit (HOSPITAL_COMMUNITY)
Admission: RE | Admit: 2020-01-03 | Discharge: 2020-01-03 | Disposition: A | Discharge status: Home / Self Care | Payer: Medicare HMO | Source: Ambulatory Visit | Attending: Nephrology | Admitting: Nephrology

## 2020-01-03 VITALS — BP 127/73 | HR 86 | Temp 97.3°F | Resp 20

## 2020-01-03 DIAGNOSIS — N185 Chronic kidney disease, stage 5: Secondary | ICD-10-CM | POA: Diagnosis not present

## 2020-01-03 LAB — RENAL FUNCTION PANEL
Albumin: 3.4 g/dL — ABNORMAL LOW (ref 3.5–5.0)
Anion gap: 18 — ABNORMAL HIGH (ref 5–15)
BUN: 76 mg/dL — ABNORMAL HIGH (ref 8–23)
CO2: 17 mmol/L — ABNORMAL LOW (ref 22–32)
Calcium: 9.3 mg/dL (ref 8.9–10.3)
Chloride: 105 mmol/L (ref 98–111)
Creatinine, Ser: 4.83 mg/dL — ABNORMAL HIGH (ref 0.61–1.24)
GFR calc Af Amer: 12 mL/min — ABNORMAL LOW (ref >60)
GFR calc non Af Amer: 10 mL/min — ABNORMAL LOW (ref >60)
Glucose, Bld: 105 mg/dL — ABNORMAL HIGH (ref 70–99)
Phosphorus: 4.9 mg/dL — ABNORMAL HIGH (ref 2.5–4.6)
Potassium: 4 mmol/L (ref 3.5–5.1)
Sodium: 140 mmol/L (ref 135–145)

## 2020-01-03 LAB — IRON AND TIBC
Iron: 76 ug/dL (ref 45–182)
Saturation Ratios: 29 % (ref 17.9–39.5)
TIBC: 266 ug/dL (ref 250–450)
UIBC: 190 ug/dL

## 2020-01-03 LAB — POCT HEMOGLOBIN-HEMACUE: Hemoglobin: 10.2 g/dL — ABNORMAL LOW (ref 13.0–17.0)

## 2020-01-03 LAB — FERRITIN: Ferritin: 195 ng/mL (ref 24–336)

## 2020-01-03 MED ORDER — EPOETIN ALFA 20000 UNIT/ML IJ SOLN
INTRAMUSCULAR | Status: AC
  Filled 2020-01-03: qty 1

## 2020-01-03 MED ORDER — EPOETIN ALFA 20000 UNIT/ML IJ SOLN
20000.0000 [IU] | INTRAMUSCULAR | Status: DC
  Administered 2020-01-03: 20000 [IU] via SUBCUTANEOUS

## 2020-01-04 LAB — PTH, INTACT AND CALCIUM
Calcium, Total (PTH): 9.5 mg/dL (ref 8.6–10.2)
PTH: 10 pg/mL — ABNORMAL LOW (ref 15–65)

## 2020-01-09 ENCOUNTER — Other Ambulatory Visit: Payer: Self-pay

## 2020-01-09 DIAGNOSIS — N185 Chronic kidney disease, stage 5: Secondary | ICD-10-CM

## 2020-01-15 DIAGNOSIS — R3914 Feeling of incomplete bladder emptying: Secondary | ICD-10-CM | POA: Diagnosis not present

## 2020-01-17 ENCOUNTER — Other Ambulatory Visit: Payer: Self-pay

## 2020-01-17 ENCOUNTER — Ambulatory Visit (HOSPITAL_COMMUNITY)
Admission: RE | Admit: 2020-01-17 | Discharge: 2020-01-17 | Disposition: A | Discharge status: Home / Self Care | Payer: Medicare HMO | Source: Ambulatory Visit | Attending: Nephrology | Admitting: Nephrology

## 2020-01-17 VITALS — BP 120/74 | HR 76 | Temp 97.3°F | Resp 20

## 2020-01-17 DIAGNOSIS — N185 Chronic kidney disease, stage 5: Secondary | ICD-10-CM | POA: Diagnosis not present

## 2020-01-17 LAB — POCT HEMOGLOBIN-HEMACUE: Hemoglobin: 10.8 g/dL — ABNORMAL LOW (ref 13.0–17.0)

## 2020-01-17 MED ORDER — EPOETIN ALFA 20000 UNIT/ML IJ SOLN
20000.0000 [IU] | INTRAMUSCULAR | Status: DC
  Administered 2020-01-17: 20000 [IU] via SUBCUTANEOUS

## 2020-01-17 MED ORDER — EPOETIN ALFA 20000 UNIT/ML IJ SOLN
INTRAMUSCULAR | Status: AC
  Filled 2020-01-17: qty 1

## 2020-01-24 ENCOUNTER — Encounter: Payer: Self-pay | Admitting: *Deleted

## 2020-01-24 ENCOUNTER — Other Ambulatory Visit: Payer: Self-pay

## 2020-01-24 ENCOUNTER — Ambulatory Visit (HOSPITAL_COMMUNITY)
Admission: RE | Admit: 2020-01-24 | Discharge: 2020-01-24 | Disposition: A | Discharge status: Home / Self Care | Payer: Medicare HMO | Source: Ambulatory Visit | Attending: Vascular Surgery | Admitting: Vascular Surgery

## 2020-01-24 ENCOUNTER — Other Ambulatory Visit: Payer: Self-pay | Admitting: *Deleted

## 2020-01-24 ENCOUNTER — Ambulatory Visit (INDEPENDENT_AMBULATORY_CARE_PROVIDER_SITE_OTHER): Payer: Medicare HMO | Admitting: Vascular Surgery

## 2020-01-24 ENCOUNTER — Encounter: Payer: Self-pay | Admitting: Vascular Surgery

## 2020-01-24 ENCOUNTER — Ambulatory Visit (INDEPENDENT_AMBULATORY_CARE_PROVIDER_SITE_OTHER)
Admission: RE | Admit: 2020-01-24 | Discharge: 2020-01-24 | Disposition: A | Discharge status: Home / Self Care | Payer: Medicare HMO | Source: Ambulatory Visit | Attending: Vascular Surgery | Admitting: Vascular Surgery

## 2020-01-24 VITALS — BP 130/72 | HR 74 | Temp 97.9°F | Resp 20 | Ht 71.0 in | Wt 146.0 lb

## 2020-01-24 DIAGNOSIS — N185 Chronic kidney disease, stage 5: Secondary | ICD-10-CM

## 2020-01-24 NOTE — Progress Notes (Signed)
Referring Physician: Elmarie Shiley  Patient name: Richard Moses MRN: [id number] DOB: 01-05-1933 Sex: male  REASON FOR CONSULT: Hemodialysis access HPI: Richard Moses is a 86 y.o. male, who is CKD 5 and approaching need for hemodialysis.  He is right-handed.  He has not had any prior access procedures.  He is not currently on hemodialysis.  He does not have a pacemaker.  Other medical problems include chronic back pain with degenerative arthritis in both knees, diabetes, hypertension all of which are currently stable.  Past Medical History:  Diagnosis Date   Abnormal MRI, cervical spine 09/29/10   Mildly abnormal MRI cervical spine demonstrating mild spondylosis and disc bulging from C4-5 down to C7-T1.  No spinal stenosis or foraminal narrowing   Anemia    Arthritis    Blood transfusion without reported diagnosis    BPH (benign prostatic hyperplasia)    Cancer (HCC)    possible melanoma on lt.leg   Cataract    beginning stage   Cervical disc herniation 09/29/10   C4-C5 and C7-T1   Chronic kidney disease (CKD)    stage 4 per dr patel nephrology lov 10-11-2019   Chronic left-sided headaches    Colon polyp 2007   Diabetes mellitus    diet dontrolled   Elevated PSA    External hemorrhoids without mention of complication 8546   GERD (gastroesophageal reflux disease)    History of hypertension    no current meds for   Hydronephrosis    acute renal failure superimposed on stage 4 ckd   Hyperlipidemia    Hypothyroidism 09/29/10   Untreated. Patient not interested in Rx.    Intermittent self-catheterization of bladder    placed every month, foley in all the time   Knee pain, bilateral 02/05/2013   Pain since a fall in 2014   MRI of brain abnormal 09/29/10   Abnormal MRI of brain, demonstrating mild atrophy   Neck pain on left side 2012   Neuropathy    Plantar fasciitis    left foot   Secondary hyperparathyroidism ([place])    Past Surgical History:  Procedure  Laterality Date   COLONOSCOPY     CYSTOSCOPY WITH LITHOLAPAXY N/A 11/19/2019   Procedure: CYSTOSCOPY WITH LITHOLAPAXY;  Surgeon: Lucas Mallow, MD;  Location[facility];  Service: Urology;  Laterality: N/A;   left knee melanoma removed  2018   MELANOMA EXCISION     left side of neck   POLYPECTOMY     RHINOPLASTY     x2     Family History  Problem Relation Age of Onset   Cancer Father 77       Renal CA   Cancer Sister        Brain    Cancer Brother        Brain   Colon cancer Neg Hx    Stomach cancer Neg Hx    Colon polyps Neg Hx    Esophageal cancer Neg Hx    Rectal cancer Neg Hx     SOCIAL HISTORY: Social History   Socioeconomic History   Marital status: Single    Spouse name: Not on file   Number of children: 0   Years of education: Not on file   Highest education level: Not on file  Occupational History   Occupation: investment Product/process development scientist: [employer]  Tobacco Use   Smoking status: Former Smoker    Types: Cigarettes    Quit  date: 12/06/1966    Years since quitting: 53.1   Smokeless tobacco: Never Used  Vaping Use   Vaping Use: Never used  Substance and Sexual Activity   Alcohol use: Not Currently    Alcohol/week: 0.0 standard drinks   Drug use: No   Sexual activity: Never  Other Topics Concern   Not on file  Social History Narrative   Lives with boarder (young [place] man) - he helps with computer work managing his property in [place]. [name] is a Systems developer and an interpreter.      Family lives in [place] where he is from originally       Updated 12/11/2015   Social Determinants of Health   Financial Resource Strain:    Difficulty of Paying Living Expenses: Not on file  Food Insecurity:    Worried About Charity fundraiser in the Last Year: Not on file   [employer] of Food in the Last Year: Not on file  Transportation Needs:    Lack of Transportation (Medical): Not on file   Lack of  Transportation (Non-Medical): Not on file  Physical Activity:    Days of Exercise per Week: Not on file   Minutes of Exercise per Session: Not on file  Stress:    Feeling of Stress : Not on file  Social Connections:    Frequency of Communication with Friends and Family: Not on file   Frequency of Social Gatherings with Friends and Family: Not on file   Attends Religious Services: Not on file   Active Member of Clubs or Organizations: Not on file   Attends Archivist Meetings: Not on file   Marital Status: Not on file  Intimate Partner Violence:    Fear of Current or Ex-Partner: Not on file   Emotionally Abused: Not on file   Physically Abused: Not on file   Sexually Abused: Not on file    No Known Allergies  Current Outpatient Medications  Medication Sig Dispense Refill   ascorbic acid (VITAMIN C) 500 MG tablet Take by mouth.     Barberry-Oreg Grape-Goldenseal (BERBERINE COMPLEX PO) Take 500 mg by mouth 3 (three) times daily with meals. Reported on 12/11/2015     Cholecalciferol (VITAMIN D3) 10 MCG (400 UNIT) tablet Take by mouth.     Epoetin Alfa (PROCRIT IJ) Inject as directed every 14 (fourteen) days.     Multiple Vitamins-Minerals (MENS MULTIVITAMIN PLUS) TABS Take 1 tablet by mouth daily after breakfast.     Omega-3 Fatty Acids (FISH OIL PO) Take by mouth daily.     No current facility-administered medications for this visit.    ROS:   General:  No weight loss, Fever, chills  HEENT: No recent headaches, no nasal bleeding, no visual changes, no sore throat  Neurologic: No dizziness, blackouts, seizures. No recent symptoms of stroke or mini- stroke. No recent episodes of slurred speech, or temporary blindness.  Cardiac: No recent episodes of chest pain/pressure, no shortness of breath at rest.  No shortness of breath with exertion.  Denies history of atrial fibrillation or irregular heartbeat  Vascular: No history of rest pain in feet.  No  history of claudication.  No history of non-healing ulcer, No history of DVT   Pulmonary: No home oxygen, no productive cough, no hemoptysis,  No asthma or wheezing  Musculoskeletal:  [X]  Arthritis, [X]  Low back pain,  [X]  Joint pain  Hematologic:No history of hypercoagulable state.  No history of easy bleeding.  No history  of anemia  Gastrointestinal: No hematochezia or melena,  No gastroesophageal reflux, no trouble swallowing  Urinary: [X]  chronic Kidney disease, [ ]  on HD - [ ]  MWF or [ ]  TTHS, [ ]  Burning with urination, [ ]  Frequent urination, [ ]  Difficulty urinating;   Skin: No rashes  Psychological: No history of anxiety,  No history of depression   Physical Examination  Vitals:   01/24/20 1343  BP: 130/72  Pulse: 74  Resp: 20  Temp: 97.9 F (36.6 C)  SpO2: 98%  Weight: 146 lb (66.2 kg)  Height: 5\' 11"  (1.803 m)    Body mass index is 20.36 kg/m.  General:  Alert and oriented, no acute distress HEENT: Normal Neck: No JVD Cardiac: Regular Rate and Rhythm Skin: No rash Extremity Pulses:  2+ radial, brachial pulses bilaterally Musculoskeletal: No deformity or edema  Neurologic: Upper and lower extremity motor 5/5 and symmetric  DATA:  Patient had a vein mapping ultrasound today which shows no upper arm cephalic vein bilaterally.  He had a 3 mm left forearm cephalic vein a 2 mm right forearm cephalic vein a 4 mm left basilic vein and a 3 mm right basilic vein  ASSESSMENT: Patient needs long-term hemodialysis access.  He has anatomy suitable for creation of a left radiocephalic AV fistula.   PLAN: Left radiocephalic AV fistula scheduled for February 25, 2020.  Risk benefits possible complications of procedure details including not limited to bleeding infection nonmaturation of the fistula ischemic steal periincisional numbness all discussed with the patient today.  He understands and agrees to proceed.   Ruta Hinds, MD [facility]  [place] [place] Office: [phone]

## 2020-01-30 IMAGING — US US RENAL
1 series · 14 of 25 positions shown · non-contrast
Comparison: None.

CLINICAL DATA: Initial evaluation for chronic kidney disease, stage
5. History of diabetes, hypertension.

EXAM:
RENAL / URINARY TRACT ULTRASOUND COMPLETE

[Series 1: us renal · 0.23mm/px · 14 of 41 slices shown]
[im 1/41]
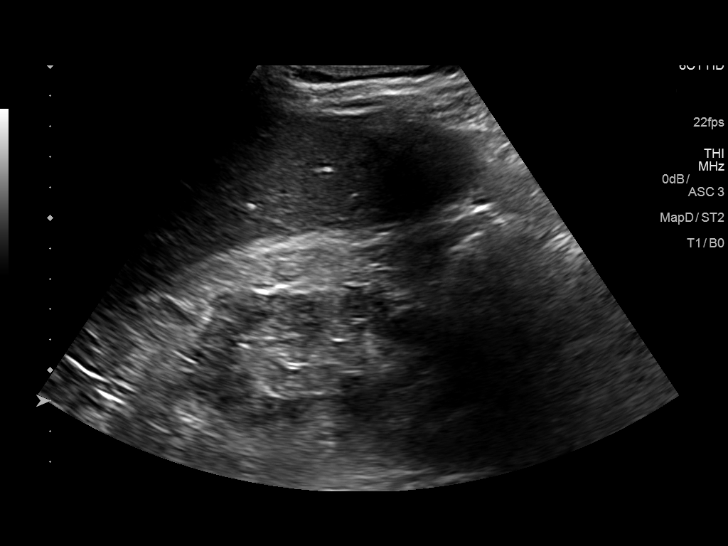
[im 4/41]
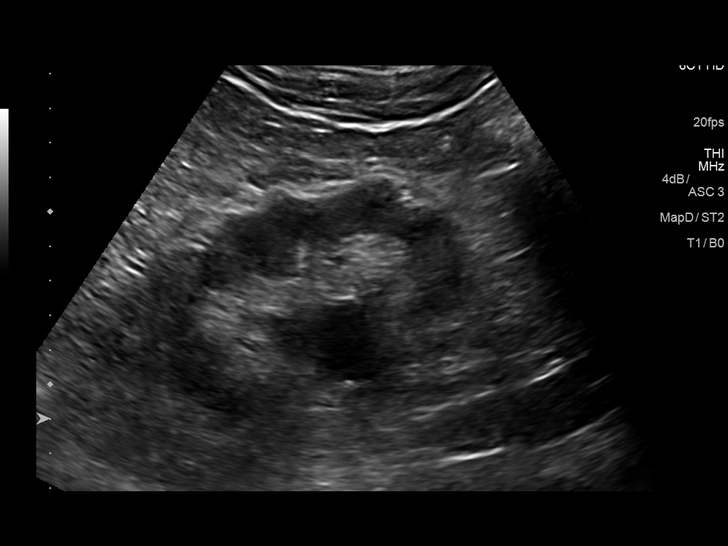
[im 7/41]
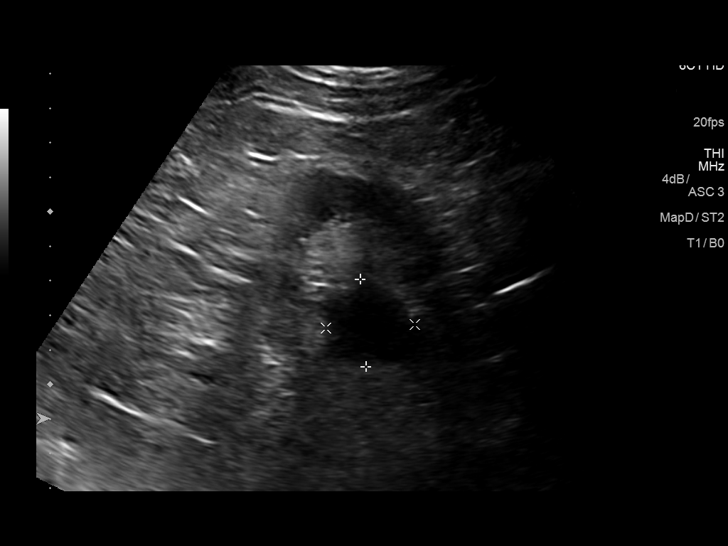
[im 11/41]
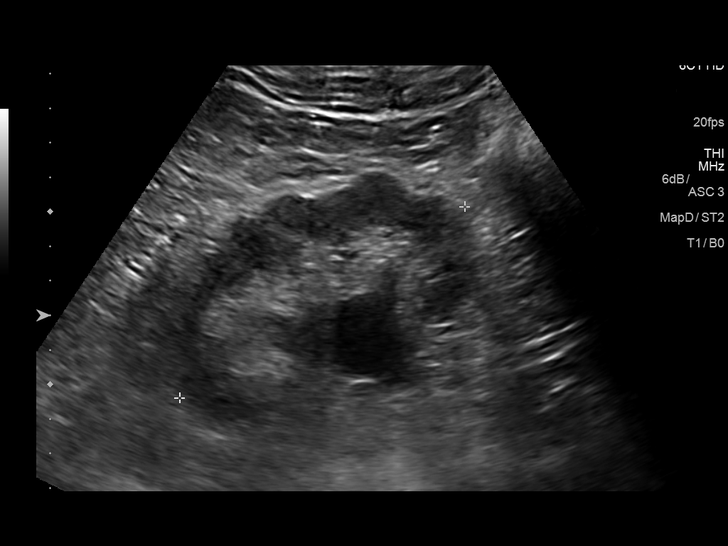
[im 14/41]
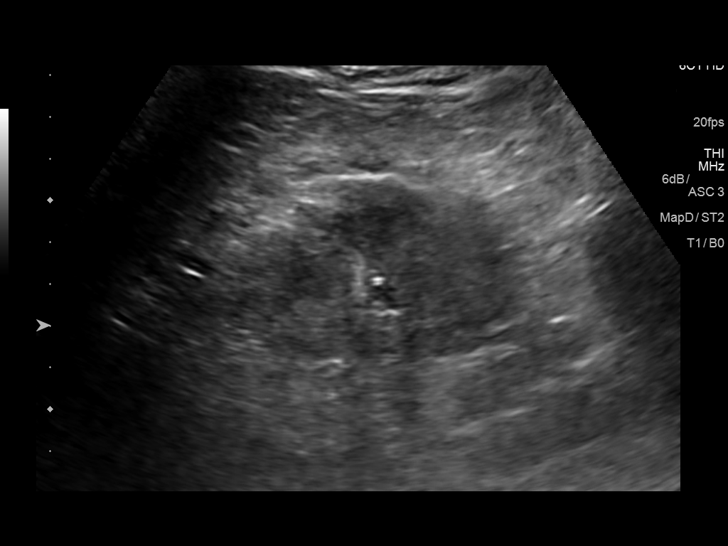
[im 16/41]
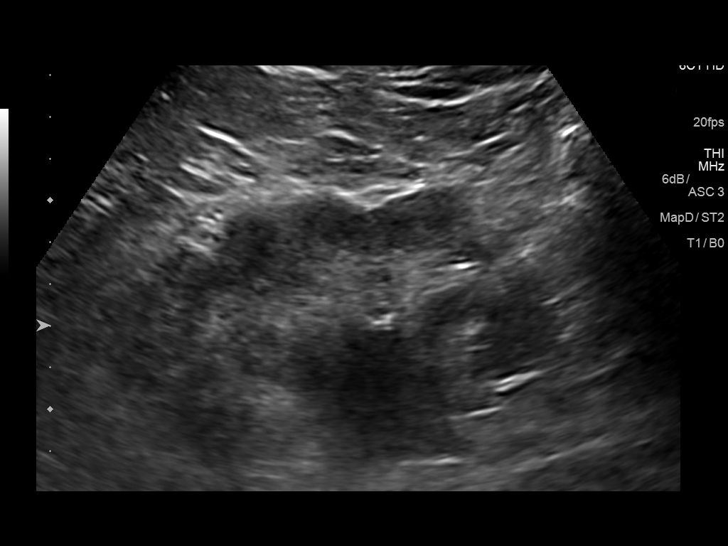
[im 19/41]
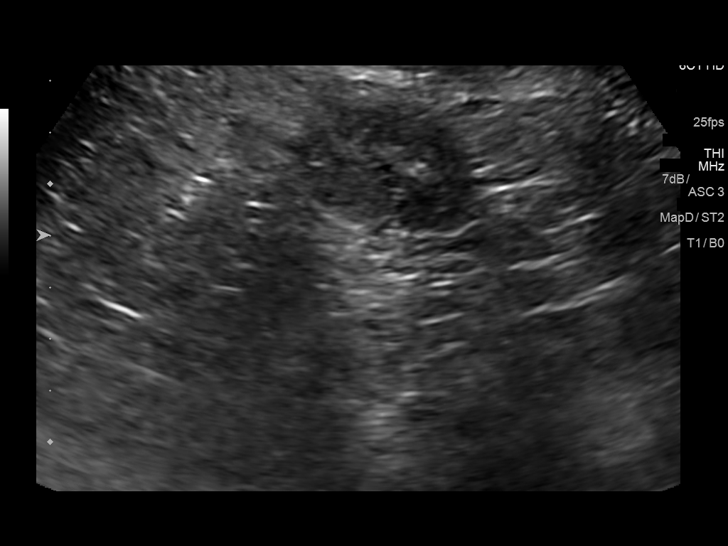
[im 22/41]
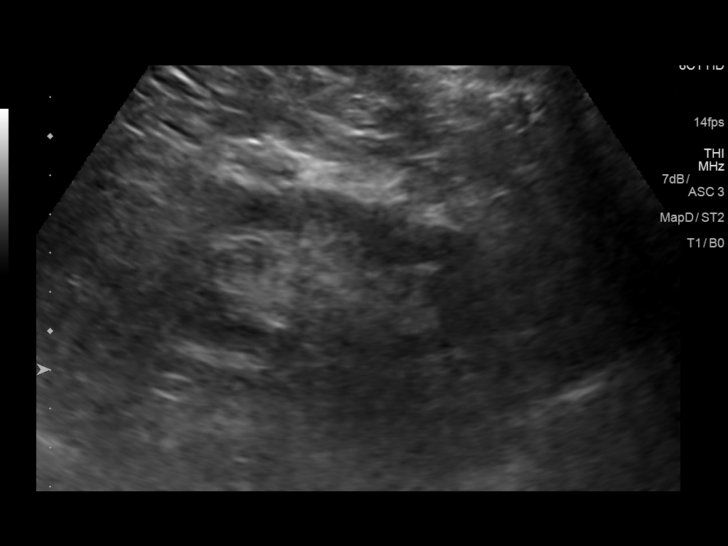
[im 26/41]
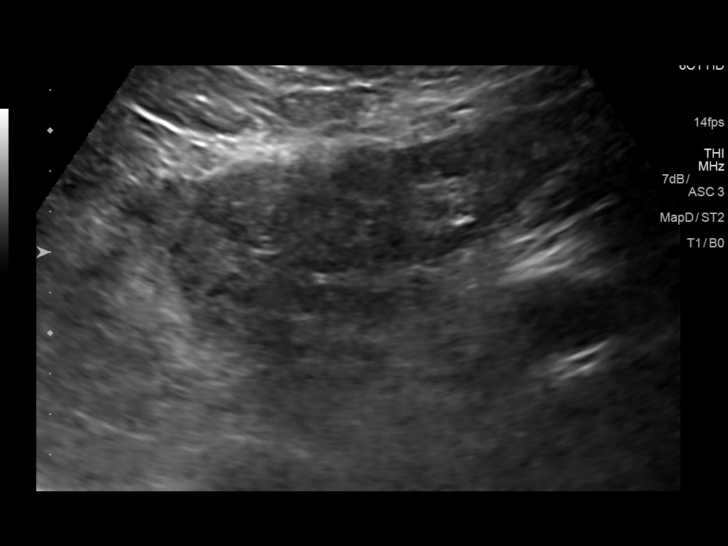
[im 27/41]
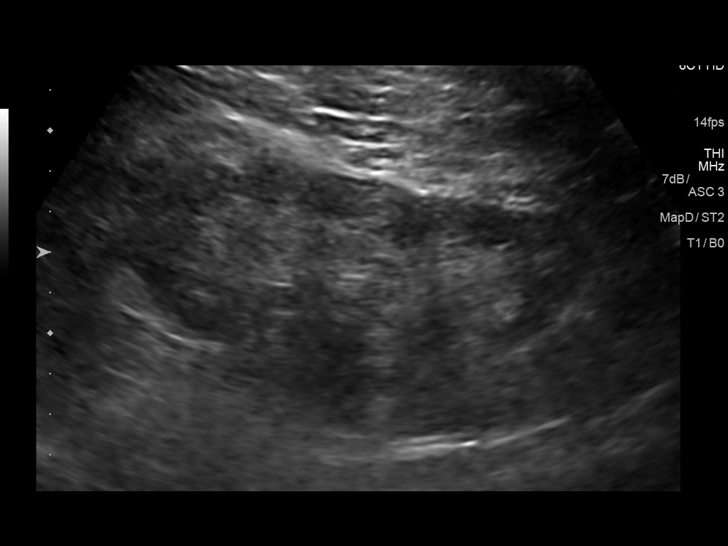
[im 31/41]
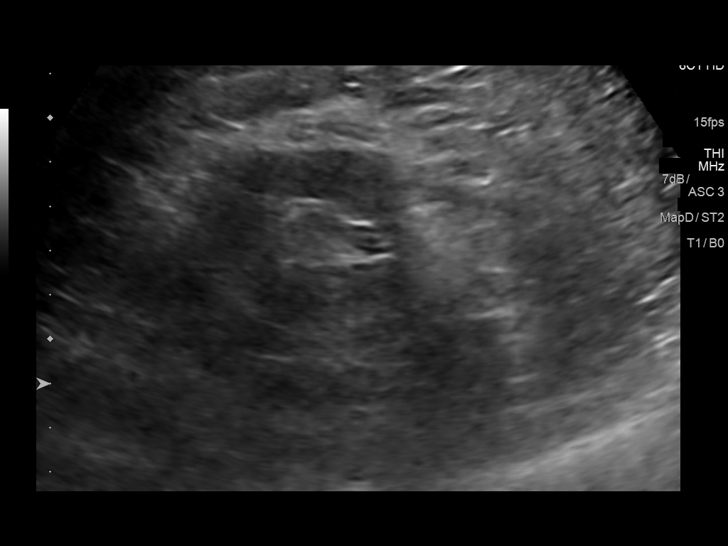
[im 34/41]
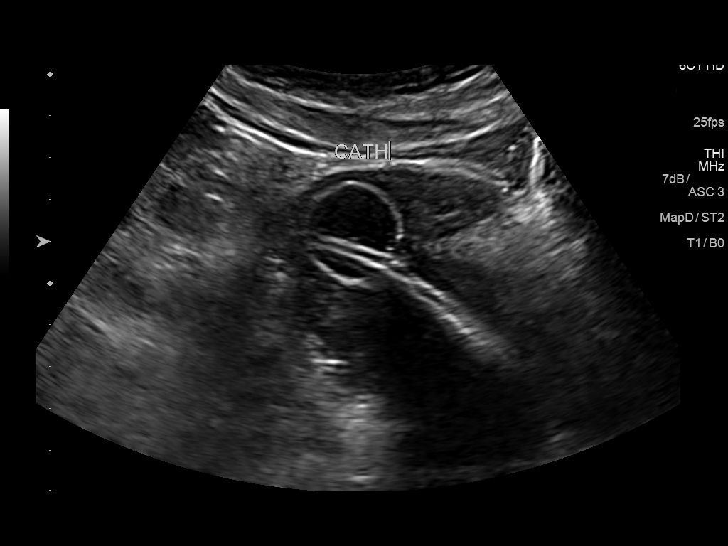
[im 37/41]
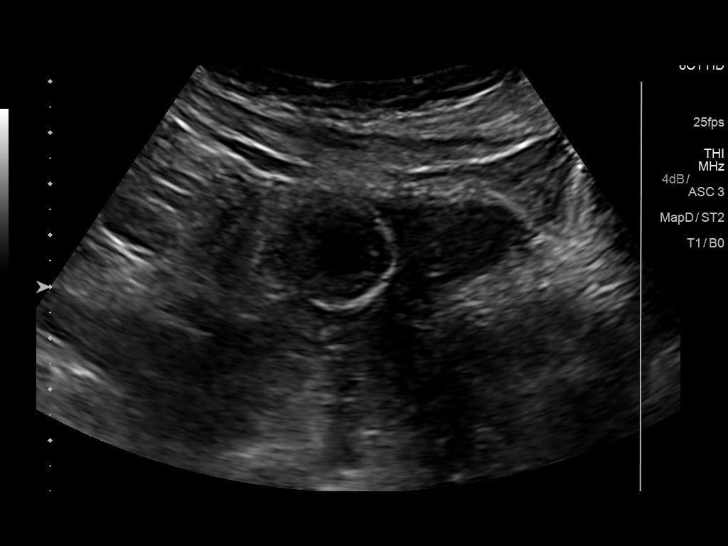
[im 41/41]
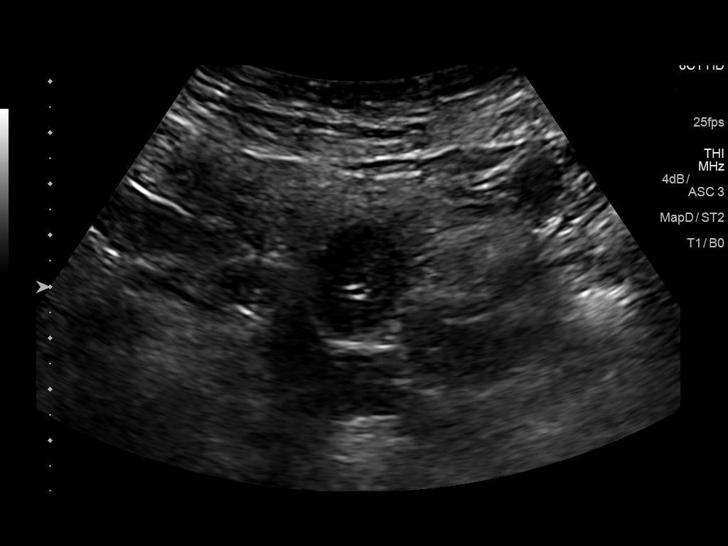

[14 of 25 positions shown; findings below may reference images not displayed]

FINDINGS: Right Kidney:

Length: 9.9 cm. Cortical thinning with lobulated contour and
diffusely increased echogenicity. No hydronephrosis or
nephrolithiasis. 1.9 x 2.5 x 2.8 cm simple cyst present within the
interpolar region.

Left Kidney:

Length: 11.0 cm. Cortical thinning with lobulated contour and
diffusely increased echogenicity. No mass or hydronephrosis
visualized.

Bladder:

Bladder decompressed with a Foley catheter in place.
IMPRESSION: 1. Cortical thinning with increased echogenicity within the renal
parenchyma, compatible with chronic medical renal disease. No
hydronephrosis.
2. 2.8 cm simple cyst within the interpolar right kidney.
3. Foley catheter in place.

## 2020-01-31 ENCOUNTER — Encounter (HOSPITAL_COMMUNITY)
Admission: RE | Admit: 2020-01-31 | Discharge: 2020-01-31 | Disposition: A | Discharge status: Home / Self Care | Payer: Medicare HMO | Source: Ambulatory Visit | Attending: Nephrology | Admitting: Nephrology

## 2020-01-31 ENCOUNTER — Other Ambulatory Visit: Payer: Self-pay

## 2020-01-31 VITALS — BP 144/71 | HR 87 | Temp 97.7°F | Resp 20

## 2020-01-31 DIAGNOSIS — N185 Chronic kidney disease, stage 5: Secondary | ICD-10-CM | POA: Insufficient documentation

## 2020-01-31 LAB — RENAL FUNCTION PANEL
Albumin: 3.6 g/dL (ref 3.5–5.0)
Anion gap: 13 (ref 5–15)
BUN: 78 mg/dL — ABNORMAL HIGH (ref 8–23)
CO2: 18 mmol/L — ABNORMAL LOW (ref 22–32)
Calcium: 9.9 mg/dL (ref 8.9–10.3)
Chloride: 110 mmol/L (ref 98–111)
Creatinine, Ser: 5.31 mg/dL — ABNORMAL HIGH (ref 0.61–1.24)
GFR calc Af Amer: 11 mL/min — ABNORMAL LOW (ref >60)
GFR calc non Af Amer: 9 mL/min — ABNORMAL LOW (ref >60)
Glucose, Bld: 112 mg/dL — ABNORMAL HIGH (ref 70–99)
Phosphorus: 5.9 mg/dL — ABNORMAL HIGH (ref 2.5–4.6)
Potassium: 4.3 mmol/L (ref 3.5–5.1)
Sodium: 141 mmol/L (ref 135–145)

## 2020-01-31 LAB — IRON AND TIBC
Iron: 72 ug/dL (ref 45–182)
Saturation Ratios: 25 % (ref 17.9–39.5)
TIBC: 288 ug/dL (ref 250–450)
UIBC: 216 ug/dL

## 2020-01-31 LAB — FERRITIN: Ferritin: 165 ng/mL (ref 24–336)

## 2020-01-31 LAB — POCT HEMOGLOBIN-HEMACUE: Hemoglobin: 10.5 g/dL — ABNORMAL LOW (ref 13.0–17.0)

## 2020-01-31 MED ORDER — EPOETIN ALFA 20000 UNIT/ML IJ SOLN
INTRAMUSCULAR | Status: AC
  Filled 2020-01-31: qty 1

## 2020-01-31 MED ORDER — EPOETIN ALFA 20000 UNIT/ML IJ SOLN
20000.0000 [IU] | INTRAMUSCULAR | Status: DC
  Administered 2020-01-31: 20000 [IU] via SUBCUTANEOUS

## 2020-02-01 LAB — PTH, INTACT AND CALCIUM
Calcium, Total (PTH): 9.7 mg/dL (ref 8.6–10.2)
PTH: 12 pg/mL — ABNORMAL LOW (ref 15–65)

## 2020-02-17 NOTE — Progress Notes (Signed)
    SUBJECTIVE:   CHIEF COMPLAINT / HPI: physical  No acute concerns.  Would like HbA1c checked today.  Was recently vacationing in [place].   PERTINENT  PMH / PSH:  RCC   OBJECTIVE:   BP (!) 104/56   Pulse 88   Ht 5\' 11"  (1.803 m)   Wt 138 lb 3.2 oz (62.7 kg)   SpO2 99%   BMI 19.27 kg/m    General: Alert and oriented, no apparent distress  Neck: nontender Cardiovascular: RRR with no murmurs noted Respiratory: CTA bilaterally  Gastrointestinal: Bowel sounds present. No abdominal pain MSK: Upper extremity strength 5/5 bilaterally, Lower extremity strength 5/5 bilaterally  Derm: No rashes noted Psych: Behavior and speech appropriate to situation  ASSESSMENT/PLAN:   Encounter for wellness examination in adult No acute concerns today.  Normal exam.  Received COVID vaccines at [facility] -HbA1c 5.6 today -Flu vaccine today -Refer to Podiatry for annual foot exam -Follow up in 1 year or sooner if needed     Carollee Leitz, MD [place]

## 2020-02-18 ENCOUNTER — Encounter (HOSPITAL_COMMUNITY)
Admission: RE | Admit: 2020-02-18 | Discharge: 2020-02-18 | Disposition: A | Discharge status: Home / Self Care | Payer: Medicare HMO | Source: Ambulatory Visit | Attending: Nephrology | Admitting: Nephrology

## 2020-02-18 ENCOUNTER — Other Ambulatory Visit: Payer: Self-pay

## 2020-02-18 VITALS — BP 125/70 | HR 93 | Temp 97.7°F | Resp 20

## 2020-02-18 DIAGNOSIS — N185 Chronic kidney disease, stage 5: Secondary | ICD-10-CM | POA: Insufficient documentation

## 2020-02-18 LAB — POCT HEMOGLOBIN-HEMACUE: Hemoglobin: 10.9 g/dL — ABNORMAL LOW (ref 13.0–17.0)

## 2020-02-18 MED ORDER — EPOETIN ALFA 20000 UNIT/ML IJ SOLN: 20000.0000 [IU] | INTRAMUSCULAR | Status: DC

## 2020-02-18 MED ORDER — EPOETIN ALFA 20000 UNIT/ML IJ SOLN
INTRAMUSCULAR | Status: AC
  Administered 2020-02-18: 20000 [IU]
  Filled 2020-02-18: qty 1

## 2020-02-19 ENCOUNTER — Other Ambulatory Visit: Payer: Self-pay

## 2020-02-19 ENCOUNTER — Encounter: Payer: Self-pay | Admitting: Family Medicine

## 2020-02-19 ENCOUNTER — Ambulatory Visit (INDEPENDENT_AMBULATORY_CARE_PROVIDER_SITE_OTHER): Payer: Medicare HMO | Admitting: Family Medicine

## 2020-02-19 VITALS — BP 104/56 | HR 88 | Ht 71.0 in | Wt 138.2 lb

## 2020-02-19 DIAGNOSIS — R3914 Feeling of incomplete bladder emptying: Secondary | ICD-10-CM | POA: Diagnosis not present

## 2020-02-19 DIAGNOSIS — Z0000 Encounter for general adult medical examination without abnormal findings: Secondary | ICD-10-CM

## 2020-02-19 DIAGNOSIS — Z23 Encounter for immunization: Secondary | ICD-10-CM | POA: Diagnosis not present

## 2020-02-19 DIAGNOSIS — G589 Mononeuropathy, unspecified: Secondary | ICD-10-CM

## 2020-02-19 DIAGNOSIS — N185 Chronic kidney disease, stage 5: Secondary | ICD-10-CM | POA: Diagnosis not present

## 2020-02-19 LAB — POCT GLYCOSYLATED HEMOGLOBIN (HGB A1C): HbA1c, POC (controlled diabetic range): 5.4 % (ref 0.0–7.0)

## 2020-02-19 NOTE — Patient Instructions (Signed)
Thank you for coming to see me today. It was a pleasure.  You should pay attention to your hemoglobin A1C.  It is a three month test about your average blood sugar. If the A1C is - <7.0 is great.  That is our goal for treating you. - Between 7.0 and 9.0 is not so good.  We would need to work to do better. - Above 9.0 is terrible.  You would really need to work with [place] to get it under control.    Today's A1C = 5.4   Please follow-up with PCP as needed  If you have any questions or concerns, please do not hesitate to call the office at [phone].  Best,   Carollee Leitz, MD [facility]

## 2020-02-21 ENCOUNTER — Encounter (HOSPITAL_COMMUNITY): Payer: Self-pay | Admitting: Vascular Surgery

## 2020-02-21 ENCOUNTER — Other Ambulatory Visit: Payer: Self-pay

## 2020-02-21 NOTE — Progress Notes (Signed)
PCP - Dr Rock Nephew Cardiologist - denies Nephrologist Patel   Chest x-ray - n/a EKG - 11/19/19 Stress Test - denies ECHO - denies Cardiac Cath - denies  Patient is very controlled diabetic, patient ran our of stips to check his blood sugar but is working on getting more  COVID TEST- 11/22/19   Anesthesia review: NO  Patient denies shortness of breath, fever, cough and chest pain at PAT appointment   All instructions explained to the patient, with a verbal understanding of the material. Patient agrees to go over the instructions while at home for a better understanding. Patient also instructed to self quarantine after being tested for COVID-19. The opportunity to ask questions was provided.

## 2020-02-22 ENCOUNTER — Other Ambulatory Visit (HOSPITAL_COMMUNITY)
Admission: RE | Admit: 2020-02-22 | Discharge: 2020-02-22 | Disposition: A | Discharge status: Home / Self Care | Payer: Medicare HMO | Source: Ambulatory Visit | Attending: Vascular Surgery | Admitting: Vascular Surgery

## 2020-02-22 DIAGNOSIS — Z20822 Contact with and (suspected) exposure to covid-19: Secondary | ICD-10-CM | POA: Insufficient documentation

## 2020-02-22 DIAGNOSIS — Z01812 Encounter for preprocedural laboratory examination: Secondary | ICD-10-CM | POA: Insufficient documentation

## 2020-02-22 LAB — SARS CORONAVIRUS 2 (TAT 6-24 HRS): SARS Coronavirus 2: NEGATIVE

## 2020-02-23 ENCOUNTER — Other Ambulatory Visit (HOSPITAL_COMMUNITY): Payer: Medicare HMO

## 2020-02-24 ENCOUNTER — Encounter: Payer: Self-pay | Admitting: Family Medicine

## 2020-02-24 NOTE — Assessment & Plan Note (Signed)
No acute concerns today.  Normal exam.  Received COVID vaccines at [facility] -HbA1c 5.6 today -Flu vaccine today -Refer to Podiatry for annual foot exam -Follow up in 1 year or sooner if needed

## 2020-02-25 ENCOUNTER — Ambulatory Visit (HOSPITAL_COMMUNITY): Payer: Medicare HMO | Admitting: Anesthesiology

## 2020-02-25 ENCOUNTER — Ambulatory Visit (HOSPITAL_COMMUNITY)
Admission: RE | Admit: 2020-02-25 | Discharge: 2020-02-25 | Disposition: A | Discharge status: Home / Self Care | Payer: Medicare HMO | Attending: Vascular Surgery | Admitting: Vascular Surgery

## 2020-02-25 ENCOUNTER — Encounter (HOSPITAL_COMMUNITY)
Admission: RE | Disposition: A | Discharge status: Home / Self Care | Payer: Self-pay | Source: Home / Self Care | Attending: Vascular Surgery

## 2020-02-25 ENCOUNTER — Encounter (HOSPITAL_COMMUNITY): Payer: Self-pay | Admitting: Vascular Surgery

## 2020-02-25 ENCOUNTER — Other Ambulatory Visit: Payer: Self-pay

## 2020-02-25 DIAGNOSIS — E1140 Type 2 diabetes mellitus with diabetic neuropathy, unspecified: Secondary | ICD-10-CM | POA: Diagnosis not present

## 2020-02-25 DIAGNOSIS — M170 Bilateral primary osteoarthritis of knee: Secondary | ICD-10-CM | POA: Diagnosis not present

## 2020-02-25 DIAGNOSIS — Z85820 Personal history of malignant melanoma of skin: Secondary | ICD-10-CM | POA: Diagnosis not present

## 2020-02-25 DIAGNOSIS — E1136 Type 2 diabetes mellitus with diabetic cataract: Secondary | ICD-10-CM | POA: Insufficient documentation

## 2020-02-25 DIAGNOSIS — N179 Acute kidney failure, unspecified: Secondary | ICD-10-CM | POA: Diagnosis not present

## 2020-02-25 DIAGNOSIS — E785 Hyperlipidemia, unspecified: Secondary | ICD-10-CM | POA: Diagnosis not present

## 2020-02-25 DIAGNOSIS — D631 Anemia in chronic kidney disease: Secondary | ICD-10-CM | POA: Diagnosis not present

## 2020-02-25 DIAGNOSIS — Z8051 Family history of malignant neoplasm of kidney: Secondary | ICD-10-CM | POA: Diagnosis not present

## 2020-02-25 DIAGNOSIS — N400 Enlarged prostate without lower urinary tract symptoms: Secondary | ICD-10-CM | POA: Diagnosis not present

## 2020-02-25 DIAGNOSIS — N2581 Secondary hyperparathyroidism of renal origin: Secondary | ICD-10-CM | POA: Diagnosis not present

## 2020-02-25 DIAGNOSIS — Z808 Family history of malignant neoplasm of other organs or systems: Secondary | ICD-10-CM | POA: Diagnosis not present

## 2020-02-25 DIAGNOSIS — I120 Hypertensive chronic kidney disease with stage 5 chronic kidney disease or end stage renal disease: Secondary | ICD-10-CM | POA: Diagnosis not present

## 2020-02-25 DIAGNOSIS — Z87891 Personal history of nicotine dependence: Secondary | ICD-10-CM | POA: Diagnosis not present

## 2020-02-25 DIAGNOSIS — N185 Chronic kidney disease, stage 5: Secondary | ICD-10-CM | POA: Insufficient documentation

## 2020-02-25 DIAGNOSIS — E1122 Type 2 diabetes mellitus with diabetic chronic kidney disease: Secondary | ICD-10-CM | POA: Insufficient documentation

## 2020-02-25 HISTORY — PX: AV FISTULA PLACEMENT: SHX1204

## 2020-02-25 LAB — POCT I-STAT, CHEM 8
BUN: 70 mg/dL — ABNORMAL HIGH (ref 8–23)
Calcium, Ion: 1.24 mmol/L (ref 1.15–1.40)
Chloride: 113 mmol/L — ABNORMAL HIGH (ref 98–111)
Creatinine, Ser: 5.6 mg/dL — ABNORMAL HIGH (ref 0.61–1.24)
Glucose, Bld: 98 mg/dL (ref 70–99)
HCT: 34 % — ABNORMAL LOW (ref 39.0–52.0)
Hemoglobin: 11.6 g/dL — ABNORMAL LOW (ref 13.0–17.0)
Potassium: 3.6 mmol/L (ref 3.5–5.1)
Sodium: 141 mmol/L (ref 135–145)
TCO2: 17 mmol/L — ABNORMAL LOW (ref 22–32)

## 2020-02-25 LAB — GLUCOSE, CAPILLARY
Glucose-Capillary: 106 mg/dL — ABNORMAL HIGH (ref 70–99)
Glucose-Capillary: 93 mg/dL (ref 70–99)

## 2020-02-25 SURGERY — ARTERIOVENOUS (AV) FISTULA CREATION
Anesthesia: Monitor Anesthesia Care | Laterality: Left

## 2020-02-25 MED ORDER — CHLORHEXIDINE GLUCONATE 0.12 % MT SOLN
15.0000 mL | Freq: Once | OROMUCOSAL | Status: AC
  Administered 2020-02-25: 15 mL via OROMUCOSAL
  Filled 2020-02-25: qty 15

## 2020-02-25 MED ORDER — ONDANSETRON HCL 4 MG/2ML IJ SOLN
INTRAMUSCULAR | Status: DC | PRN
  Administered 2020-02-25: 4 mg via INTRAVENOUS

## 2020-02-25 MED ORDER — 0.9 % SODIUM CHLORIDE (POUR BTL) OPTIME
TOPICAL | Status: DC | PRN
  Administered 2020-02-25: 1000 mL

## 2020-02-25 MED ORDER — PHENYLEPHRINE 40 MCG/ML (10ML) SYRINGE FOR IV PUSH (FOR BLOOD PRESSURE SUPPORT)
PREFILLED_SYRINGE | INTRAVENOUS | Status: AC
  Filled 2020-02-25: qty 10

## 2020-02-25 MED ORDER — CHLORHEXIDINE GLUCONATE 4 % EX LIQD: 60.0000 mL | Freq: Once | CUTANEOUS | Status: DC

## 2020-02-25 MED ORDER — ONDANSETRON HCL 4 MG/2ML IJ SOLN
INTRAMUSCULAR | Status: AC
  Filled 2020-02-25: qty 2

## 2020-02-25 MED ORDER — PHENYLEPHRINE 40 MCG/ML (10ML) SYRINGE FOR IV PUSH (FOR BLOOD PRESSURE SUPPORT)
PREFILLED_SYRINGE | INTRAVENOUS | Status: DC | PRN
  Administered 2020-02-25 (×3): 40 ug via INTRAVENOUS

## 2020-02-25 MED ORDER — PROPOFOL 10 MG/ML IV BOLUS
INTRAVENOUS | Status: AC
  Filled 2020-02-25: qty 20

## 2020-02-25 MED ORDER — FENTANYL CITRATE (PF) 100 MCG/2ML IJ SOLN
INTRAMUSCULAR | Status: DC | PRN
  Administered 2020-02-25 (×2): 25 ug via INTRAVENOUS

## 2020-02-25 MED ORDER — SODIUM CHLORIDE 0.9 % IV SOLN
INTRAVENOUS | Status: AC
  Filled 2020-02-25: qty 1.2

## 2020-02-25 MED ORDER — PROTAMINE SULFATE 10 MG/ML IV SOLN
INTRAVENOUS | Status: DC | PRN
  Administered 2020-02-25: 50 mg via INTRAVENOUS

## 2020-02-25 MED ORDER — CEFAZOLIN SODIUM-DEXTROSE 2-4 GM/100ML-% IV SOLN
2.0000 g | INTRAVENOUS | Status: AC
  Administered 2020-02-25: 2 g via INTRAVENOUS
  Filled 2020-02-25: qty 100

## 2020-02-25 MED ORDER — HEPARIN SODIUM (PORCINE) 1000 UNIT/ML IJ SOLN
INTRAMUSCULAR | Status: DC | PRN
  Administered 2020-02-25: 5000 [IU] via INTRAVENOUS

## 2020-02-25 MED ORDER — PROPOFOL 500 MG/50ML IV EMUL
INTRAVENOUS | Status: DC | PRN
  Administered 2020-02-25: 100 ug/kg/min via INTRAVENOUS

## 2020-02-25 MED ORDER — SODIUM CHLORIDE 0.9 % IV SOLN: INTRAVENOUS | Status: DC

## 2020-02-25 MED ORDER — TRAMADOL HCL 50 MG PO TABS: 50.0000 mg | ORAL_TABLET | Freq: Four times a day (QID) | ORAL | 0 refills | Status: AC | PRN

## 2020-02-25 MED ORDER — FENTANYL CITRATE (PF) 100 MCG/2ML IJ SOLN: 25.0000 ug | INTRAMUSCULAR | Status: DC | PRN

## 2020-02-25 MED ORDER — FENTANYL CITRATE (PF) 250 MCG/5ML IJ SOLN
INTRAMUSCULAR | Status: AC
  Filled 2020-02-25: qty 5

## 2020-02-25 MED ORDER — ORAL CARE MOUTH RINSE: 15.0000 mL | Freq: Once | OROMUCOSAL | Status: AC

## 2020-02-25 MED ORDER — LIDOCAINE HCL (PF) 1 % IJ SOLN
INTRAMUSCULAR | Status: DC | PRN
  Administered 2020-02-25: 30 mL

## 2020-02-25 MED ORDER — SODIUM CHLORIDE 0.9 % IV SOLN
INTRAVENOUS | Status: DC | PRN
  Administered 2020-02-25: 500 mL

## 2020-02-25 MED ORDER — LIDOCAINE HCL (PF) 1 % IJ SOLN
INTRAMUSCULAR | Status: AC
  Filled 2020-02-25: qty 30

## 2020-02-25 SURGICAL SUPPLY — 36 items
ADH SKN CLS APL DERMABOND .7 (GAUZE/BANDAGES/DRESSINGS) ×1
ARMBAND PINK RESTRICT EXTREMIT (MISCELLANEOUS) ×4 IMPLANT
CANISTER SUCT 3000ML PPV (MISCELLANEOUS) ×2 IMPLANT
CANNULA VESSEL 3MM 2 BLNT TIP (CANNULA) ×2 IMPLANT
CLIP VESOCCLUDE MED 6/CT (CLIP) ×2 IMPLANT
CLIP VESOCCLUDE SM WIDE 6/CT (CLIP) ×2 IMPLANT
COVER PROBE W GEL 5X96 (DRAPES) IMPLANT
COVER WAND RF STERILE (DRAPES) ×1 IMPLANT
DECANTER SPIKE VIAL GLASS SM (MISCELLANEOUS) ×2 IMPLANT
DERMABOND ADVANCED (GAUZE/BANDAGES/DRESSINGS) ×1
DERMABOND ADVANCED .7 DNX12 (GAUZE/BANDAGES/DRESSINGS) ×1 IMPLANT
DRAIN PENROSE 1/4X12 LTX STRL (WOUND CARE) ×2 IMPLANT
ELECT REM PT RETURN 9FT ADLT (ELECTROSURGICAL) ×2
ELECTRODE REM PT RTRN 9FT ADLT (ELECTROSURGICAL) ×1 IMPLANT
GLOVE BIO SURGEON STRL SZ7.5 (GLOVE) ×2 IMPLANT
GLOVE BIOGEL PI IND STRL 7.0 (GLOVE) IMPLANT
GLOVE BIOGEL PI INDICATOR 7.0 (GLOVE) ×1
GLOVE SURG SS PI 6.5 STRL IVOR (GLOVE) ×1 IMPLANT
GOWN STRL REUS W/ TWL LRG LVL3 (GOWN DISPOSABLE) ×3 IMPLANT
GOWN STRL REUS W/TWL LRG LVL3 (GOWN DISPOSABLE) ×6
KIT BASIN OR (CUSTOM PROCEDURE TRAY) ×2 IMPLANT
KIT TURNOVER KIT B (KITS) ×2 IMPLANT
LOOP VESSEL MINI RED (MISCELLANEOUS) ×1 IMPLANT
NS IRRIG 1000ML POUR BTL (IV SOLUTION) ×2 IMPLANT
PACK CV ACCESS (CUSTOM PROCEDURE TRAY) ×2 IMPLANT
PAD ARMBOARD 7.5X6 YLW CONV (MISCELLANEOUS) ×4 IMPLANT
SPONGE SURGIFOAM ABS GEL 100 (HEMOSTASIS) IMPLANT
SUT PROLENE 6 0 CC (SUTURE) ×2 IMPLANT
SUT PROLENE 7 0 BV 1 (SUTURE) ×1 IMPLANT
SUT VIC AB 3-0 SH 27 (SUTURE) ×2
SUT VIC AB 3-0 SH 27X BRD (SUTURE) ×1 IMPLANT
SUT VICRYL 4-0 PS2 18IN ABS (SUTURE) ×2 IMPLANT
SYR BULB IRRIG 60ML STRL (SYRINGE) ×1 IMPLANT
TOWEL GREEN STERILE (TOWEL DISPOSABLE) ×2 IMPLANT
UNDERPAD 30X36 HEAVY ABSORB (UNDERPADS AND DIAPERS) ×2 IMPLANT
WATER STERILE IRR 1000ML POUR (IV SOLUTION) ×2 IMPLANT

## 2020-02-25 NOTE — Transfer of Care (Signed)
Immediate Anesthesia Transfer of Care Note  Patient: Richard Moses  Procedure(s) Performed: ARTERIOVENOUS (AV) FISTULA CREATION LEFT (Left )  Patient Location: PACU  Anesthesia Type:MAC  Level of Consciousness: awake, alert  and oriented  Airway & Oxygen Therapy: Patient Spontanous Breathing and Patient connected to face mask oxygen  Post-op Assessment: Report given to RN and Post -op Vital signs reviewed and stable  Post vital signs: Reviewed and stable  Last Vitals:  Vitals Value Taken Time  BP 116/59 02/25/20 1013  Temp    Pulse 65 02/25/20 1021  Resp 13 02/25/20 1021  SpO2 100 % 02/25/20 1021  Vitals shown include unvalidated device data.  Last Pain:  Vitals:   02/25/20 0732  TempSrc:   PainSc: 0-No pain         Complications: No complications documented.

## 2020-02-25 NOTE — Anesthesia Postprocedure Evaluation (Signed)
Anesthesia Post Note  Patient: Richard Moses  Procedure(s) Performed: ARTERIOVENOUS (AV) FISTULA CREATION LEFT (Left )     Patient location during evaluation: PACU Anesthesia Type: MAC Level of consciousness: awake Pain management: pain level controlled Vital Signs Assessment: post-procedure vital signs reviewed and stable Respiratory status: spontaneous breathing Cardiovascular status: stable Postop Assessment: no apparent nausea or vomiting Anesthetic complications: no   No complications documented.  Last Vitals:  Vitals:   02/25/20 1043 02/25/20 1058  BP: 120/66 123/60  Pulse: 60 60  Resp: 10 12  Temp:  (!) 36.2 C  SpO2: 100% 98%    Last Pain:  Vitals:   02/25/20 1100  TempSrc:   PainSc: 0-No pain                 Idil Maslanka

## 2020-02-25 NOTE — Discharge Instructions (Signed)
° °  [facility] ° °Discharge Instructions ° °AV Fistula or Graft Surgery for Dialysis Access ° °Please refer to the following instructions for your post-procedure care. Your surgeon or physician assistant will discuss any changes with you. ° °Activity ° °You may drive the day following your surgery, if you are comfortable and no longer taking prescription pain medication. Resume full activity as the soreness in your incision resolves. ° °Bathing/Showering ° °You may shower after you go home. Keep your incision dry for 48 hours. Do not soak in a bathtub, hot tub, or swim until the incision heals completely. You may not shower if you have a hemodialysis catheter. ° °Incision Care ° °Clean your incision with mild soap and water after 48 hours. Pat the area dry with a clean towel. You do not need a bandage unless otherwise instructed. Do not apply any ointments or creams to your incision. You may have skin glue on your incision. Do not peel it off. It will come off on its own in about one week. Your arm may swell a bit after surgery. To reduce swelling use pillows to elevate your arm so it is above your heart. Your doctor will tell you if you need to lightly wrap your arm with an ACE bandage. ° °Diet ° °Resume your normal diet. There are not special food restrictions following this procedure. In order to heal from your surgery, it is CRITICAL to get adequate nutrition. Your body requires vitamins, minerals, and protein. Vegetables are the best source of vitamins and minerals. Vegetables also provide the perfect balance of protein. Processed food has little nutritional value, so try to avoid this. ° °Medications ° °Resume taking all of your medications. If your incision is causing pain, you may take over-the counter pain relievers such as acetaminophen (Tylenol). If you were prescribed a stronger pain medication, please be aware these medications can cause nausea and constipation. Prevent  nausea by taking the medication with a snack or meal. Avoid constipation by drinking plenty of fluids and eating foods with high amount of fiber, such as fruits, vegetables, and grains. Do not take Tylenol if you are taking prescription pain medications. ° ° ° ° °Follow up °Your surgeon may want to see you in the office following your access surgery. If so, this will be arranged at the time of your surgery. ° °Please call us immediately for any of the following conditions: ° °Increased pain, redness, drainage (pus) from your incision site °Fever of 101 degrees or higher °Severe or worsening pain at your incision site °Hand pain or numbness. ° °Reduce your risk of vascular disease: ° °Stop smoking. If you would like help, call [facility] at 1-800-QUIT-NOW (1-800-784-8669) or [facility] at [phone] ° °Manage your cholesterol °Maintain a desired weight °Control your diabetes °Keep your blood pressure down ° °Dialysis ° °It will take several weeks to several months for your new dialysis access to be ready for use. Your surgeon will determine when it is OK to use it. Your nephrologist will continue to direct your dialysis. You can continue to use your Permcath until your new access is ready for use. ° °If you have any questions, please call the office at [phone]. ° °

## 2020-02-25 NOTE — Anesthesia Procedure Notes (Signed)
Procedure Name: MAC Performed by: Alain Marion, CRNA Pre-anesthesia Checklist: Patient identified, Emergency Drugs available, Suction available and Patient being monitored Oxygen Delivery Method: Simple face mask Placement Confirmation: positive ETCO2

## 2020-02-25 NOTE — H&P (Signed)
Referring Physician: Elmarie Shiley  Patient name: Richard Moses MRN: [id number]        DOB: 1933/04/21          Sex: male  REASON FOR CONSULT: Hemodialysis access HPI: Richard Moses is a 86 y.o. male, who is CKD 5 and approaching need for hemodialysis.  He is right-handed.  He has not had any prior access procedures.  He is not currently on hemodialysis.  He does not have a pacemaker.  Other medical problems include chronic back pain with degenerative arthritis in both knees, diabetes, hypertension all of which are currently stable.      Past Medical History:  Diagnosis Date  . Abnormal MRI, cervical spine 09/29/10   Mildly abnormal MRI cervical spine demonstrating mild spondylosis and disc bulging from C4-5 down to C7-T1.  No spinal stenosis or foraminal narrowing  . Anemia   . Arthritis   . Blood transfusion without reported diagnosis   . BPH (benign prostatic hyperplasia)   . Cancer [facility])    possible melanoma on lt.leg  . Cataract    beginning stage  . Cervical disc herniation 09/29/10   C4-C5 and C7-T1  . Chronic kidney disease (CKD)    stage 4 per dr patel nephrology lov 10-11-2019  . Chronic left-sided headaches   . Colon polyp 2007  . Diabetes mellitus    diet dontrolled  . Elevated PSA   . External hemorrhoids without mention of complication 3810  . GERD (gastroesophageal reflux disease)   . History of hypertension    no current meds for  . Hydronephrosis    acute renal failure superimposed on stage 4 ckd  . Hyperlipidemia   . Hypothyroidism 09/29/10   Untreated. Patient not interested in Rx.   . Intermittent self-catheterization of bladder    placed every month, foley in all the time  . Knee pain, bilateral 02/05/2013   Pain since a fall in 2014  . MRI of brain abnormal 09/29/10   Abnormal MRI of brain, demonstrating mild atrophy  . Neck pain on left side 2012  . Neuropathy   . Plantar fasciitis    left foot  . Secondary  hyperparathyroidism [facility])         Past Surgical History:  Procedure Laterality Date  . COLONOSCOPY    . CYSTOSCOPY WITH LITHOLAPAXY N/A 11/19/2019   Procedure: CYSTOSCOPY WITH LITHOLAPAXY;  Surgeon: Lucas Mallow, MD;  Location: [facility];  Service: Urology;  Laterality: N/A;  . left knee melanoma removed  2018  . MELANOMA EXCISION     left side of neck  . POLYPECTOMY    . RHINOPLASTY     x2          Family History  Problem Relation Age of Onset  . Cancer Father 73       Renal CA  . Cancer Sister        Brain   . Cancer Brother        Brain  . Colon cancer Neg Hx   . Stomach cancer Neg Hx   . Colon polyps Neg Hx   . Esophageal cancer Neg Hx   . Rectal cancer Neg Hx     SOCIAL HISTORY: Social History        Socioeconomic History  . Marital status: Single    Spouse name: Not on file  . Number of children: 0  . Years of education: Not on file  . Highest education level: Not  on file  Occupational History  . Occupation: Education officer, environmental: OTHER  Tobacco Use  . Smoking status: Former Smoker    Types: Cigarettes    Quit date: 12/06/1966    Years since quitting: 53.1  . Smokeless tobacco: Never Used  Vaping Use  . Vaping Use: Never used  Substance and Sexual Activity  . Alcohol use: Not Currently    Alcohol/week: 0.0 standard drinks  . Drug use: No  . Sexual activity: Never  Other Topics Concern  . Not on file  Social History Narrative   Lives with boarder (young [place] man) - he helps with computer work managing his property in [place]. [name] is a Systems developer and an interpreter.      Family lives in [place] where he is from originally       Updated 12/11/2015   Social Determinants of Health      Financial Resource Strain:   . Difficulty of Paying Living Expenses: Not on file  Food Insecurity:   . Worried About Charity fundraiser in the Last Year: Not on file  .  Ran Out of Food in the Last Year: Not on file  Transportation Needs:   . Lack of Transportation (Medical): Not on file  . Lack of Transportation (Non-Medical): Not on file  Physical Activity:   . Days of Exercise per Week: Not on file  . Minutes of Exercise per Session: Not on file  Stress:   . Feeling of Stress : Not on file  Social Connections:   . Frequency of Communication with Friends and Family: Not on file  . Frequency of Social Gatherings with Friends and Family: Not on file  . Attends Religious Services: Not on file  . Active Member of Clubs or Organizations: Not on file  . Attends Archivist Meetings: Not on file  . Marital Status: Not on file  Intimate Partner Violence:   . Fear of Current or Ex-Partner: Not on file  . Emotionally Abused: Not on file  . Physically Abused: Not on file  . Sexually Abused: Not on file    No Known Allergies        Current Outpatient Medications  Medication Sig Dispense Refill  . ascorbic acid (VITAMIN C) 500 MG tablet Take by mouth.    Jolyne Loa Grape-Goldenseal (BERBERINE COMPLEX PO) Take 500 mg by mouth 3 (three) times daily with meals. Reported on 12/11/2015    . Cholecalciferol (VITAMIN D3) 10 MCG (400 UNIT) tablet Take by mouth.    Marland Kitchen Epoetin Alfa (PROCRIT IJ) Inject as directed every 14 (fourteen) days.    . Multiple Vitamins-Minerals (MENS MULTIVITAMIN PLUS) TABS Take 1 tablet by mouth daily after breakfast.    . Omega-3 Fatty Acids (FISH OIL PO) Take by mouth daily.     No current facility-administered medications for this visit.    ROS:   General:  No weight loss, Fever, chills  HEENT: No recent headaches, no nasal bleeding, no visual changes, no sore throat  Neurologic: No dizziness, blackouts, seizures. No recent symptoms of stroke or mini- stroke. No recent episodes of slurred speech, or temporary blindness.  Cardiac: No recent episodes of chest pain/pressure, no shortness of breath  at rest.  No shortness of breath with exertion.  Denies history of atrial fibrillation or irregular heartbeat  Vascular: No history of rest pain in feet.  No history of claudication.  No history of non-healing ulcer, No history of DVT  Pulmonary: No home oxygen, no productive cough, no hemoptysis,  No asthma or wheezing  Musculoskeletal:  [X]  Arthritis, [X]  Low back pain,  [X]  Joint pain  Hematologic:No history of hypercoagulable state.  No history of easy bleeding.  No history of anemia  Gastrointestinal: No hematochezia or melena,  No gastroesophageal reflux, no trouble swallowing  Urinary: [X]  chronic Kidney disease, [ ]  on HD - [ ]  MWF or [ ]  TTHS, [ ]  Burning with urination, [ ]  Frequent urination, [ ]  Difficulty urinating;   Skin: No rashes  Psychological: No history of anxiety,  No history of depression   Physical Examination   Vitals:   02/25/20 0711 02/25/20 0816  BP: (!) 184/88 (!) 151/65  Pulse: 79   Resp: 19   Temp: 97.9 F (36.6 C)   TempSrc: Temporal   SpO2: 100%   Weight: 65.3 kg   Height: 5\' 11"  (1.803 m)     General:  Alert and oriented, no acute distress HEENT: Normal Neck: No JVD Cardiac: Regular Rate and Rhythm Skin: No rash Extremity Pulses:  2+ radial, brachial pulses bilaterally Musculoskeletal: No deformity or edema      Neurologic: Upper and lower extremity motor 5/5 and symmetric  DATA:  Patient had a vein mapping ultrasound today which shows no upper arm cephalic vein bilaterally.  He had a 3 mm left forearm cephalic vein a 2 mm right forearm cephalic vein a 4 mm left basilic vein and a 3 mm right basilic vein  ASSESSMENT: Patient needs long-term hemodialysis access.  He has anatomy suitable for creation of a left radiocephalic AV fistula.   PLAN: Left radiocephalic AV fistula scheduled for February 25, 2020.  Risk benefits possible complications of procedure details including not limited to bleeding infection  nonmaturation of the fistula ischemic steal periincisional numbness all discussed with the patient today.  He understands and agrees to proceed.   Ruta Hinds, MD [facility] [place] Office: [phone]

## 2020-02-25 NOTE — Op Note (Signed)
Procedure: Left radiocephalic AV fistula  Preoperative diagnosis: CKD 5  Postoperative diagnosis: Same  Anesthesia: Local with IV sedation  Assistant: Gerri Lins, PA-C for assistance with exposure and expediting procedure and creation of anastomosis  Operative findings: 3 mm cephalic vein 3 mm radial artery  Operative details: After pain informed consent, the patient was taken the operating.  The patient was placed in supine position operating table.  Adequate sedation patient's entire left upper extremities prepped and draped in usual sterile fashion.  Local anesthesia was infiltrated midway between the cephalic vein and radial artery.  Longitudinal incision was made in this location carried down through the subcutaneous tissues down the level left cephalic vein.  There was some spasm.  The vein was dissected free circumferentially.  Small side branches were ligated divided tween silk ties.  Next the radial artery was dissected free in the medial portion the incision.  This was slightly calcified but overall had a good pulse quality.  Small side branches were ligated divided tween silk ties and a vessel loop was placed around this.  Patient was then given 5000 intravenous heparin.  The distal cephalic vein was transected and the distal stump was ligated with 2-0 silk tie.  The vein was then gently distended with sequential dilators up to a 4 dilator.  It was just thoroughly flushed with heparinized saline.  It was then marked for orientation.  After appropriate circulation time for the heparin the artery was controlled proximally distally with final blood clamps.  A longitudinal opening was made in the radial artery and the vein was swung over to this level and sewn end of vein to side of artery using a running 6-0 Prolene suture.  Just prior to completion the anastomosis it was for blood backbled and thoroughly flushed.  The anastomosis was secured clamps released there was a palpable thrill in  the fistula immediately.  1 repair stitch was placed.  Subcutaneous tissues were reapproximated using running 3-0 Vicryl suture.  Skin was closed with a 4-0 Vicryl subcuticular stitch.  Dermabond was applied.  Patient tired procedure well and there were no complications.  Instrument sponge and needle counts correct at the end of the case.  Patient was taken the recovery room in stable condition.  Ruta Hinds, MD [facility] [place] Office: [phone]

## 2020-02-25 NOTE — Anesthesia Preprocedure Evaluation (Signed)
Anesthesia Evaluation  Patient identified by MRN, date of birth, ID band Patient awake    Reviewed: Allergy & Precautions, NPO status , Patient's Chart, lab work & pertinent test results  Airway Mallampati: II  TM Distance: >3 FB     Dental   Pulmonary former smoker,    breath sounds clear to auscultation       Cardiovascular hypertension, + Peripheral Vascular Disease   Rhythm:Regular Rate:Normal     Neuro/Psych    GI/Hepatic Neg liver ROS, GERD  ,  Endo/Other  diabetesHypothyroidism   Renal/GU Renal disease     Musculoskeletal   Abdominal   Peds  Hematology   Anesthesia Other Findings   Reproductive/Obstetrics                             Anesthesia Physical Anesthesia Plan  ASA: III  Anesthesia Plan: MAC   Post-op Pain Management:    Induction: Intravenous  PONV Risk Score and Plan: 2 and Ondansetron, Dexamethasone and Midazolam  Airway Management Planned: Nasal Cannula and Simple Face Mask  Additional Equipment:   Intra-op Plan:   Post-operative Plan:   Informed Consent: I have reviewed the patients History and Physical, chart, labs and discussed the procedure including the risks, benefits and alternatives for the proposed anesthesia with the patient or authorized representative who has indicated his/her understanding and acceptance.     Dental advisory given  Plan Discussed with: CRNA and Anesthesiologist  Anesthesia Plan Comments:         Anesthesia Quick Evaluation

## 2020-02-26 ENCOUNTER — Encounter (HOSPITAL_COMMUNITY): Payer: Self-pay | Admitting: Vascular Surgery

## 2020-02-29 DIAGNOSIS — D631 Anemia in chronic kidney disease: Secondary | ICD-10-CM | POA: Diagnosis not present

## 2020-02-29 DIAGNOSIS — N2581 Secondary hyperparathyroidism of renal origin: Secondary | ICD-10-CM | POA: Diagnosis not present

## 2020-02-29 DIAGNOSIS — N185 Chronic kidney disease, stage 5: Secondary | ICD-10-CM | POA: Diagnosis not present

## 2020-02-29 DIAGNOSIS — I120 Hypertensive chronic kidney disease with stage 5 chronic kidney disease or end stage renal disease: Secondary | ICD-10-CM | POA: Diagnosis not present

## 2020-03-03 ENCOUNTER — Other Ambulatory Visit: Payer: Self-pay

## 2020-03-03 ENCOUNTER — Ambulatory Visit (HOSPITAL_COMMUNITY)
Admission: RE | Admit: 2020-03-03 | Discharge: 2020-03-03 | Disposition: A | Discharge status: Home / Self Care | Payer: Medicare HMO | Source: Ambulatory Visit | Attending: Nephrology | Admitting: Nephrology

## 2020-03-03 VITALS — BP 144/85 | HR 77 | Temp 97.5°F | Resp 20

## 2020-03-03 DIAGNOSIS — N185 Chronic kidney disease, stage 5: Secondary | ICD-10-CM | POA: Insufficient documentation

## 2020-03-03 LAB — RENAL FUNCTION PANEL
Albumin: 3.6 g/dL (ref 3.5–5.0)
Anion gap: 14 (ref 5–15)
BUN: 69 mg/dL — ABNORMAL HIGH (ref 8–23)
CO2: 19 mmol/L — ABNORMAL LOW (ref 22–32)
Calcium: 10 mg/dL (ref 8.9–10.3)
Chloride: 106 mmol/L (ref 98–111)
Creatinine, Ser: 4.3 mg/dL — ABNORMAL HIGH (ref 0.61–1.24)
GFR calc Af Amer: 14 mL/min — ABNORMAL LOW (ref >60)
GFR calc non Af Amer: 12 mL/min — ABNORMAL LOW (ref >60)
Glucose, Bld: 108 mg/dL — ABNORMAL HIGH (ref 70–99)
Phosphorus: 4 mg/dL (ref 2.5–4.6)
Potassium: 3.8 mmol/L (ref 3.5–5.1)
Sodium: 139 mmol/L (ref 135–145)

## 2020-03-03 LAB — FERRITIN: Ferritin: 224 ng/mL (ref 24–336)

## 2020-03-03 LAB — IRON AND TIBC
Iron: 77 ug/dL (ref 45–182)
Saturation Ratios: 27 % (ref 17.9–39.5)
TIBC: 288 ug/dL (ref 250–450)
UIBC: 211 ug/dL

## 2020-03-03 LAB — POCT HEMOGLOBIN-HEMACUE: Hemoglobin: 11.1 g/dL — ABNORMAL LOW (ref 13.0–17.0)

## 2020-03-03 MED ORDER — EPOETIN ALFA 20000 UNIT/ML IJ SOLN
INTRAMUSCULAR | Status: AC
  Filled 2020-03-03: qty 1

## 2020-03-03 MED ORDER — EPOETIN ALFA 20000 UNIT/ML IJ SOLN
20000.0000 [IU] | INTRAMUSCULAR | Status: DC
  Administered 2020-03-03: 20000 [IU] via SUBCUTANEOUS

## 2020-03-04 DIAGNOSIS — R030 Elevated blood-pressure reading, without diagnosis of hypertension: Secondary | ICD-10-CM | POA: Diagnosis not present

## 2020-03-04 DIAGNOSIS — R269 Unspecified abnormalities of gait and mobility: Secondary | ICD-10-CM | POA: Diagnosis not present

## 2020-03-04 DIAGNOSIS — Z8249 Family history of ischemic heart disease and other diseases of the circulatory system: Secondary | ICD-10-CM | POA: Diagnosis not present

## 2020-03-04 DIAGNOSIS — Z87891 Personal history of nicotine dependence: Secondary | ICD-10-CM | POA: Diagnosis not present

## 2020-03-04 DIAGNOSIS — Z85828 Personal history of other malignant neoplasm of skin: Secondary | ICD-10-CM | POA: Diagnosis not present

## 2020-03-04 DIAGNOSIS — Z833 Family history of diabetes mellitus: Secondary | ICD-10-CM | POA: Diagnosis not present

## 2020-03-04 DIAGNOSIS — N529 Male erectile dysfunction, unspecified: Secondary | ICD-10-CM | POA: Diagnosis not present

## 2020-03-04 LAB — PTH, INTACT AND CALCIUM
Calcium, Total (PTH): 9.9 mg/dL (ref 8.6–10.2)
PTH: 8 pg/mL — ABNORMAL LOW (ref 15–65)

## 2020-03-07 ENCOUNTER — Other Ambulatory Visit: Payer: Self-pay

## 2020-03-07 DIAGNOSIS — N185 Chronic kidney disease, stage 5: Secondary | ICD-10-CM

## 2020-03-10 DIAGNOSIS — H5203 Hypermetropia, bilateral: Secondary | ICD-10-CM | POA: Diagnosis not present

## 2020-03-10 DIAGNOSIS — Z0100 Encounter for examination of eyes and vision without abnormal findings: Secondary | ICD-10-CM | POA: Diagnosis not present

## 2020-03-10 DIAGNOSIS — H2513 Age-related nuclear cataract, bilateral: Secondary | ICD-10-CM | POA: Diagnosis not present

## 2020-03-10 DIAGNOSIS — H52203 Unspecified astigmatism, bilateral: Secondary | ICD-10-CM | POA: Diagnosis not present

## 2020-03-10 DIAGNOSIS — E119 Type 2 diabetes mellitus without complications: Secondary | ICD-10-CM | POA: Diagnosis not present

## 2020-03-10 DIAGNOSIS — H524 Presbyopia: Secondary | ICD-10-CM | POA: Diagnosis not present

## 2020-03-14 ENCOUNTER — Telehealth: Payer: Self-pay

## 2020-03-14 NOTE — Telephone Encounter (Signed)
Patient calls nurse line due to difficulty breathing. Patient reports onset Wednesday, 03/12/20. Patient reports exertional shortness of breath, with mild SHOB when resting. Patient also reports increased weakness.   Patient denies cough, fever, runny nose, and body aches. Patient had COVID testing last week that came back negative. Patient reports receiving all three COVID vaccinations. Due to new onset and no history of lung disease, advised patient to be evaluated in UC/ED. Patient verbalizes understanding.   To PCP  Talbot Grumbling, RN

## 2020-03-15 ENCOUNTER — Emergency Department (HOSPITAL_COMMUNITY): Payer: Medicare HMO

## 2020-03-15 ENCOUNTER — Other Ambulatory Visit: Payer: Self-pay

## 2020-03-15 ENCOUNTER — Inpatient Hospital Stay (HOSPITAL_COMMUNITY): Payer: Medicare HMO

## 2020-03-15 ENCOUNTER — Ambulatory Visit (HOSPITAL_COMMUNITY)
Admission: EM | Admit: 2020-03-15 | Discharge: 2020-03-15 | Disposition: A | Discharge status: Home / Self Care | Payer: Medicare HMO

## 2020-03-15 ENCOUNTER — Encounter (HOSPITAL_COMMUNITY): Payer: Self-pay | Admitting: *Deleted

## 2020-03-15 DIAGNOSIS — Z20822 Contact with and (suspected) exposure to covid-19: Secondary | ICD-10-CM | POA: Diagnosis present

## 2020-03-15 DIAGNOSIS — R7989 Other specified abnormal findings of blood chemistry: Secondary | ICD-10-CM | POA: Diagnosis present

## 2020-03-15 DIAGNOSIS — R197 Diarrhea, unspecified: Secondary | ICD-10-CM | POA: Diagnosis present

## 2020-03-15 DIAGNOSIS — E785 Hyperlipidemia, unspecified: Secondary | ICD-10-CM | POA: Diagnosis present

## 2020-03-15 DIAGNOSIS — R54 Age-related physical debility: Secondary | ICD-10-CM | POA: Diagnosis present

## 2020-03-15 DIAGNOSIS — Z79899 Other long term (current) drug therapy: Secondary | ICD-10-CM

## 2020-03-15 DIAGNOSIS — E1142 Type 2 diabetes mellitus with diabetic polyneuropathy: Secondary | ICD-10-CM | POA: Diagnosis present

## 2020-03-15 DIAGNOSIS — R0602 Shortness of breath: Secondary | ICD-10-CM | POA: Diagnosis not present

## 2020-03-15 DIAGNOSIS — Z4901 Encounter for fitting and adjustment of extracorporeal dialysis catheter: Secondary | ICD-10-CM | POA: Diagnosis not present

## 2020-03-15 DIAGNOSIS — N2581 Secondary hyperparathyroidism of renal origin: Secondary | ICD-10-CM | POA: Diagnosis not present

## 2020-03-15 DIAGNOSIS — G510 Bell's palsy: Secondary | ICD-10-CM | POA: Diagnosis present

## 2020-03-15 DIAGNOSIS — I770 Arteriovenous fistula, acquired: Secondary | ICD-10-CM | POA: Diagnosis present

## 2020-03-15 DIAGNOSIS — K219 Gastro-esophageal reflux disease without esophagitis: Secondary | ICD-10-CM | POA: Diagnosis present

## 2020-03-15 DIAGNOSIS — I350 Nonrheumatic aortic (valve) stenosis: Secondary | ICD-10-CM | POA: Diagnosis not present

## 2020-03-15 DIAGNOSIS — N186 End stage renal disease: Secondary | ICD-10-CM | POA: Diagnosis not present

## 2020-03-15 DIAGNOSIS — I214 Non-ST elevation (NSTEMI) myocardial infarction: Secondary | ICD-10-CM | POA: Diagnosis not present

## 2020-03-15 DIAGNOSIS — E039 Hypothyroidism, unspecified: Secondary | ICD-10-CM | POA: Diagnosis present

## 2020-03-15 DIAGNOSIS — R609 Edema, unspecified: Secondary | ICD-10-CM | POA: Diagnosis present

## 2020-03-15 DIAGNOSIS — R570 Cardiogenic shock: Secondary | ICD-10-CM | POA: Diagnosis not present

## 2020-03-15 DIAGNOSIS — Z7189 Other specified counseling: Secondary | ICD-10-CM | POA: Diagnosis not present

## 2020-03-15 DIAGNOSIS — I5041 Acute combined systolic (congestive) and diastolic (congestive) heart failure: Secondary | ICD-10-CM | POA: Diagnosis not present

## 2020-03-15 DIAGNOSIS — E1151 Type 2 diabetes mellitus with diabetic peripheral angiopathy without gangrene: Secondary | ICD-10-CM | POA: Diagnosis present

## 2020-03-15 DIAGNOSIS — Z8051 Family history of malignant neoplasm of kidney: Secondary | ICD-10-CM

## 2020-03-15 DIAGNOSIS — J9601 Acute respiratory failure with hypoxia: Secondary | ICD-10-CM | POA: Diagnosis not present

## 2020-03-15 DIAGNOSIS — R338 Other retention of urine: Secondary | ICD-10-CM | POA: Diagnosis present

## 2020-03-15 DIAGNOSIS — Z515 Encounter for palliative care: Secondary | ICD-10-CM | POA: Diagnosis not present

## 2020-03-15 DIAGNOSIS — I132 Hypertensive heart and chronic kidney disease with heart failure and with stage 5 chronic kidney disease, or end stage renal disease: Secondary | ICD-10-CM | POA: Diagnosis present

## 2020-03-15 DIAGNOSIS — H9193 Unspecified hearing loss, bilateral: Secondary | ICD-10-CM | POA: Diagnosis present

## 2020-03-15 DIAGNOSIS — J90 Pleural effusion, not elsewhere classified: Secondary | ICD-10-CM | POA: Diagnosis not present

## 2020-03-15 DIAGNOSIS — N401 Enlarged prostate with lower urinary tract symptoms: Secondary | ICD-10-CM | POA: Diagnosis present

## 2020-03-15 DIAGNOSIS — Z85820 Personal history of malignant melanoma of skin: Secondary | ICD-10-CM

## 2020-03-15 DIAGNOSIS — Z66 Do not resuscitate: Secondary | ICD-10-CM | POA: Diagnosis not present

## 2020-03-15 DIAGNOSIS — E1122 Type 2 diabetes mellitus with diabetic chronic kidney disease: Secondary | ICD-10-CM | POA: Diagnosis present

## 2020-03-15 DIAGNOSIS — R55 Syncope and collapse: Secondary | ICD-10-CM | POA: Diagnosis present

## 2020-03-15 DIAGNOSIS — I10 Essential (primary) hypertension: Secondary | ICD-10-CM | POA: Diagnosis present

## 2020-03-15 DIAGNOSIS — E872 Acidosis: Secondary | ICD-10-CM | POA: Diagnosis not present

## 2020-03-15 DIAGNOSIS — N185 Chronic kidney disease, stage 5: Secondary | ICD-10-CM | POA: Diagnosis not present

## 2020-03-15 DIAGNOSIS — I361 Nonrheumatic tricuspid (valve) insufficiency: Secondary | ICD-10-CM | POA: Diagnosis not present

## 2020-03-15 DIAGNOSIS — R531 Weakness: Secondary | ICD-10-CM | POA: Diagnosis not present

## 2020-03-15 DIAGNOSIS — Z538 Procedure and treatment not carried out for other reasons: Secondary | ICD-10-CM | POA: Diagnosis not present

## 2020-03-15 DIAGNOSIS — I200 Unstable angina: Secondary | ICD-10-CM | POA: Diagnosis not present

## 2020-03-15 DIAGNOSIS — Z79891 Long term (current) use of opiate analgesic: Secondary | ICD-10-CM

## 2020-03-15 DIAGNOSIS — E440 Moderate protein-calorie malnutrition: Secondary | ICD-10-CM | POA: Diagnosis present

## 2020-03-15 DIAGNOSIS — D631 Anemia in chronic kidney disease: Secondary | ICD-10-CM | POA: Diagnosis not present

## 2020-03-15 DIAGNOSIS — E43 Unspecified severe protein-calorie malnutrition: Secondary | ICD-10-CM | POA: Diagnosis present

## 2020-03-15 DIAGNOSIS — R079 Chest pain, unspecified: Secondary | ICD-10-CM

## 2020-03-15 DIAGNOSIS — F0390 Unspecified dementia without behavioral disturbance: Secondary | ICD-10-CM | POA: Diagnosis present

## 2020-03-15 DIAGNOSIS — Z681 Body mass index (BMI) 19 or less, adult: Secondary | ICD-10-CM | POA: Diagnosis not present

## 2020-03-15 DIAGNOSIS — G629 Polyneuropathy, unspecified: Secondary | ICD-10-CM

## 2020-03-15 DIAGNOSIS — I480 Paroxysmal atrial fibrillation: Secondary | ICD-10-CM | POA: Diagnosis not present

## 2020-03-15 DIAGNOSIS — R1310 Dysphagia, unspecified: Secondary | ICD-10-CM | POA: Diagnosis present

## 2020-03-15 DIAGNOSIS — R0781 Pleurodynia: Secondary | ICD-10-CM | POA: Diagnosis present

## 2020-03-15 DIAGNOSIS — I739 Peripheral vascular disease, unspecified: Secondary | ICD-10-CM | POA: Diagnosis present

## 2020-03-15 DIAGNOSIS — R601 Generalized edema: Secondary | ICD-10-CM | POA: Diagnosis not present

## 2020-03-15 DIAGNOSIS — R778 Other specified abnormalities of plasma proteins: Secondary | ICD-10-CM

## 2020-03-15 DIAGNOSIS — Z87891 Personal history of nicotine dependence: Secondary | ICD-10-CM

## 2020-03-15 DIAGNOSIS — Z992 Dependence on renal dialysis: Secondary | ICD-10-CM | POA: Diagnosis not present

## 2020-03-15 LAB — CBC WITH DIFFERENTIAL/PLATELET
Abs Immature Granulocytes: 0.03 10*3/uL (ref 0.00–0.07)
Basophils Absolute: 0 10*3/uL (ref 0.0–0.1)
Basophils Relative: 0 %
Eosinophils Absolute: 0 10*3/uL (ref 0.0–0.5)
Eosinophils Relative: 0 %
HCT: 35.2 % — ABNORMAL LOW (ref 39.0–52.0)
Hemoglobin: 10.6 g/dL — ABNORMAL LOW (ref 13.0–17.0)
Immature Granulocytes: 0 %
Lymphocytes Relative: 6 %
Lymphs Abs: 0.5 10*3/uL — ABNORMAL LOW (ref 0.7–4.0)
MCH: 31.5 pg (ref 26.0–34.0)
MCHC: 30.1 g/dL (ref 30.0–36.0)
MCV: 104.5 fL — ABNORMAL HIGH (ref 80.0–100.0)
Monocytes Absolute: 0.6 10*3/uL (ref 0.1–1.0)
Monocytes Relative: 9 %
Neutro Abs: 6.3 10*3/uL (ref 1.7–7.7)
Neutrophils Relative %: 85 %
Platelets: 171 10*3/uL (ref 150–400)
RBC: 3.37 MIL/uL — ABNORMAL LOW (ref 4.22–5.81)
RDW: 15.9 % — ABNORMAL HIGH (ref 11.5–15.5)
WBC: 7.4 10*3/uL (ref 4.0–10.5)
nRBC: 0 % (ref 0.0–0.2)

## 2020-03-15 LAB — BASIC METABOLIC PANEL
Anion gap: 14 (ref 5–15)
BUN: 80 mg/dL — ABNORMAL HIGH (ref 8–23)
CO2: 17 mmol/L — ABNORMAL LOW (ref 22–32)
Calcium: 8.9 mg/dL (ref 8.9–10.3)
Chloride: 103 mmol/L (ref 98–111)
Creatinine, Ser: 6.27 mg/dL — ABNORMAL HIGH (ref 0.61–1.24)
GFR, Estimated: 7 mL/min — ABNORMAL LOW (ref >60)
Glucose, Bld: 202 mg/dL — ABNORMAL HIGH (ref 70–99)
Potassium: 4.7 mmol/L (ref 3.5–5.1)
Sodium: 134 mmol/L — ABNORMAL LOW (ref 135–145)

## 2020-03-15 LAB — GLUCOSE, CAPILLARY: Glucose-Capillary: 170 mg/dL — ABNORMAL HIGH (ref 70–99)

## 2020-03-15 LAB — HEPATIC FUNCTION PANEL
ALT: 25 U/L (ref 0–44)
AST: 74 U/L — ABNORMAL HIGH (ref 15–41)
Albumin: 3.6 g/dL (ref 3.5–5.0)
Alkaline Phosphatase: 65 U/L (ref 38–126)
Bilirubin, Direct: 0.1 mg/dL (ref 0.0–0.2)
Indirect Bilirubin: 0.7 mg/dL (ref 0.3–0.9)
Total Bilirubin: 0.8 mg/dL (ref 0.3–1.2)
Total Protein: 7.2 g/dL (ref 6.5–8.1)

## 2020-03-15 LAB — TROPONIN I (HIGH SENSITIVITY)
Troponin I (High Sensitivity): 7391 ng/L (ref <18)
Troponin I (High Sensitivity): 7160 ng/L (ref <18)

## 2020-03-15 LAB — RESPIRATORY PANEL BY RT PCR (FLU A&B, COVID)
Influenza A by PCR: NEGATIVE
Influenza B by PCR: NEGATIVE
SARS Coronavirus 2 by RT PCR: NEGATIVE

## 2020-03-15 LAB — LIPASE, BLOOD: Lipase: 30 U/L (ref 11–51)

## 2020-03-15 LAB — BRAIN NATRIURETIC PEPTIDE: B Natriuretic Peptide: 4445.6 pg/mL — ABNORMAL HIGH (ref 0.0–100.0)

## 2020-03-15 MED ORDER — TECHNETIUM TO 99M ALBUMIN AGGREGATED
4.2000 | Freq: Once | INTRAVENOUS | Status: AC
  Administered 2020-03-15: 4.2 via INTRAVENOUS

## 2020-03-15 MED ORDER — TRAMADOL HCL 50 MG PO TABS
50.0000 mg | ORAL_TABLET | Freq: Two times a day (BID) | ORAL | Status: DC | PRN
  Administered 2020-03-16 – 2020-03-20 (×3): 50 mg via ORAL
  Filled 2020-03-15 (×3): qty 1

## 2020-03-15 MED ORDER — ACETAMINOPHEN 650 MG RE SUPP: 650.0000 mg | Freq: Four times a day (QID) | RECTAL | Status: DC | PRN

## 2020-03-15 MED ORDER — SODIUM CHLORIDE (PF) 0.9 % IJ SOLN
INTRAMUSCULAR | Status: AC
  Filled 2020-03-15: qty 50

## 2020-03-15 MED ORDER — NEPRO/CARBSTEADY PO LIQD
237.0000 mL | Freq: Two times a day (BID) | ORAL | Status: DC
  Administered 2020-03-16 – 2020-03-19 (×2): 237 mL via ORAL
  Filled 2020-03-15 (×2): qty 237

## 2020-03-15 MED ORDER — ONDANSETRON HCL 4 MG/2ML IJ SOLN
4.0000 mg | Freq: Four times a day (QID) | INTRAMUSCULAR | Status: DC | PRN
  Administered 2020-03-20 – 2020-03-21 (×2): 4 mg via INTRAVENOUS
  Filled 2020-03-15 (×2): qty 2

## 2020-03-15 MED ORDER — ASPIRIN 81 MG PO CHEW
324.0000 mg | CHEWABLE_TABLET | Freq: Once | ORAL | Status: AC
  Administered 2020-03-15: 324 mg via ORAL
  Filled 2020-03-15: qty 4

## 2020-03-15 MED ORDER — HEPARIN BOLUS VIA INFUSION
2000.0000 [IU] | Freq: Once | INTRAVENOUS | Status: AC
  Administered 2020-03-15: 2000 [IU] via INTRAVENOUS
  Filled 2020-03-15: qty 2000

## 2020-03-15 MED ORDER — ACETAMINOPHEN 325 MG PO TABS: 650.0000 mg | ORAL_TABLET | Freq: Four times a day (QID) | ORAL | Status: DC | PRN

## 2020-03-15 MED ORDER — PANTOPRAZOLE SODIUM 40 MG PO TBEC
40.0000 mg | DELAYED_RELEASE_TABLET | Freq: Every day | ORAL | Status: DC
  Administered 2020-03-16 – 2020-03-21 (×6): 40 mg via ORAL
  Filled 2020-03-15 (×6): qty 1

## 2020-03-15 MED ORDER — ONDANSETRON HCL 4 MG PO TABS: 4.0000 mg | ORAL_TABLET | Freq: Four times a day (QID) | ORAL | Status: DC | PRN

## 2020-03-15 MED ORDER — SODIUM CHLORIDE 0.9 % IV BOLUS
1000.0000 mL | Freq: Once | INTRAVENOUS | Status: AC
  Administered 2020-03-15: 1000 mL via INTRAVENOUS

## 2020-03-15 MED ORDER — HEPARIN (PORCINE) 25000 UT/250ML-% IV SOLN
1000.0000 [IU]/h | INTRAVENOUS | Status: DC
  Administered 2020-03-15 – 2020-03-17 (×3): 1150 [IU]/h via INTRAVENOUS
  Administered 2020-03-18 – 2020-03-20 (×3): 1250 [IU]/h via INTRAVENOUS
  Administered 2020-03-21: 1150 [IU]/h via INTRAVENOUS
  Filled 2020-03-15 (×7): qty 250

## 2020-03-15 NOTE — ED Triage Notes (Signed)
Patient in with c/o of chest discomfort with breathing  and sob that started 2-3 days ago. Chest pain worse upon inspiration, but not worse with palpation. Also c/o blurred vision and light headedness with vomiting last night.   Unable to tolerate food and fluid yesterday but has had some food and fluids today   Caregiver states that upon walking to car patient passed out, he got him into car and after about 1 minute patient became alert  Patient has not had any medication for sxs  Denies nausea, radiation to arm/jaw/ back, or fever  [place], MD notified of patient status. Patient and caregiver advised to go to ED. Transport by EMS offered, caregiver declined. He will provide transport to [facility].

## 2020-03-15 NOTE — Progress Notes (Signed)
ANTICOAGULATION CONSULT NOTE - Initial Consult  Pharmacy Consult for IV heparin Indication: pulmonary embolus  No Known Allergies  Patient Measurements: Height: 5\' 11"  (180.3 cm) Weight: 63.5 kg (140 lb) IBW/kg (Calculated) : 75.3 Heparin Dosing Weight: 63.5 kg  Vital Signs: Temp: 98 F (36.7 C) (10/09 1514) Temp Source: Oral (10/09 1514) BP: 115/77 (10/09 1600) Pulse Rate: 95 (10/09 1600)  Labs: Recent Labs    03/22/2020 1529  HGB 10.6*  HCT 35.2*  PLT 171  CREATININE 6.27*  TROPONINIHS 7,391*    Estimated Creatinine Clearance: 7.7 mL/min (A) (by C-G formula based on SCr of 6.27 mg/dL (H)).   Medical History: Past Medical History:  Diagnosis Date  . Abnormal MRI, cervical spine 09/29/10   Mildly abnormal MRI cervical spine demonstrating mild spondylosis and disc bulging from C4-5 down to C7-T1.  No spinal stenosis or foraminal narrowing  . Anemia   . Arthritis   . Blood transfusion without reported diagnosis   . BPH (benign prostatic hyperplasia)   . Cancer [facility])    possible melanoma on lt.leg  . Cataract    beginning stage  . Cervical disc herniation 09/29/10   C4-C5 and C7-T1  . Chronic kidney disease (CKD)    stage 4 per dr patel nephrology lov 10-11-2019  . Chronic left-sided headaches   . Colon polyp 2007  . Diabetes mellitus    diet dontrolled  . Elevated PSA   . External hemorrhoids without mention of complication 0370  . GERD (gastroesophageal reflux disease)   . History of hypertension    no current meds for  . Hydronephrosis    acute renal failure superimposed on stage 4 ckd  . Hyperlipidemia   . Hypothyroidism 09/29/10   Untreated. Patient not interested in Rx.   . Intermittent self-catheterization of bladder    placed every month, foley in all the time  . Knee pain, bilateral 02/05/2013   Pain since a fall in 2014  . MRI of brain abnormal 09/29/10   Abnormal MRI of brain, demonstrating mild atrophy  . Neck pain on left side 2012  . Neuropathy    . Plantar fasciitis    left foot  . Secondary hyperparathyroidism (HCC)     Medications:  Scheduled:  . aspirin  324 mg Oral Once  . sodium chloride (PF)       Infusions:    Assessment: 86 yo male presented to ED with shortness of breath found to have a new PE. Patient not on anticoag medication PTA. Patient also in renal failure and has elevated troponin.   Goal of Therapy:  Heparin level 0.3-0.7 units/ml Monitor platelets by anticoagulation protocol: Yes   Plan:   IV heparin 2000 unit bolus then  IV heparin rate of 1150 units/hr  Check heparin level 8 hours after heparin started  Daily CBC  Kara Mead 04/02/2020,4:48 PM

## 2020-03-15 NOTE — ED Provider Notes (Signed)
[facility] Provider Note   CSN: [id number] Arrival date & time: 03/14/2020  1504     History Chief Complaint  Patient presents with  . Shortness of Breath  . Loss of Consciousness    Richard Moses is a 86 y.o. male history of CKD stage IV, diabetes, GERD, hypertension, hyperlipidemia, early dementia, melanoma.  Patient presents today with his son/power of attorney [name].  Patient reports that over the last 4 days he has developed chest pain and shortness of breath this has been intermittent worsened with exertion no clear alleviating factors.  Describes a pressure in his central chest nonradiating improves with rest and moderate in intensity.  He denies any active chest pain at this time.  He reports that on Wednesday he was walking around the mall when he had a syncopal episode woke up on the ground, denies any head injury or blood thinner use.  He returned home with his son, the son witnessed another syncopal episode just prior to arrival this was associated with some shortness of breath.  At this time patient reports that he is feeling well no current complaints.  History obtained with help from his son.  No recent illness, fever/chills, headache, vision changes, nausea/vomiting, cough/hemoptysis, abdominal pain, vomiting, dysuria/hematuria, extremity swelling/color change or any additional concerns.  HPI     Past Medical History:  Diagnosis Date  . Abnormal MRI, cervical spine 09/29/10   Mildly abnormal MRI cervical spine demonstrating mild spondylosis and disc bulging from C4-5 down to C7-T1.  No spinal stenosis or foraminal narrowing  . Anemia   . Arthritis   . Blood transfusion without reported diagnosis   . BPH (benign prostatic hyperplasia)   . Cancer [facility])    possible melanoma on lt.leg  . Cataract    beginning stage  . Cervical disc herniation 09/29/10   C4-C5 and C7-T1  . Chronic kidney disease (CKD)    stage 4 per dr patel  nephrology lov 10-11-2019  . Chronic left-sided headaches   . Colon polyp 2007  . Diabetes mellitus    diet dontrolled  . Elevated PSA   . External hemorrhoids without mention of complication 7341  . GERD (gastroesophageal reflux disease)   . History of hypertension    no current meds for  . Hydronephrosis    acute renal failure superimposed on stage 4 ckd  . Hyperlipidemia   . Hypothyroidism 09/29/10   Untreated. Patient not interested in Rx.   . Intermittent self-catheterization of bladder    placed every month, foley in all the time  . Knee pain, bilateral 02/05/2013   Pain since a fall in 2014  . MRI of brain abnormal 09/29/10   Abnormal MRI of brain, demonstrating mild atrophy  . Neck pain on left side 2012  . Neuropathy   . Plantar fasciitis    left foot  . Secondary hyperparathyroidism [facility])     Patient Active Problem List   Diagnosis Date Noted  . Elevated troponin 03/23/2020  . Bilateral shoulder pain 09/08/2019  . Peripheral artery disease ([place]) 09/08/2019  . Chronic indwelling Foley catheter 08/17/2019  . Diarrhea 08/17/2019  . Acquired nasal deformity 10/16/2018  . Secondary hyperparathyroidism ([place]) 03/03/2018  . Memory disturbance 03/03/2017  . Bilateral knee pain 03/03/2017  . Bilateral cataracts 01/17/2017  . Peripheral neuropathy 11/30/2016  . Anemia associated with stage 5 chronic renal failure ([place]) 07/14/2016  . Encounter for wellness examination in adult 04/23/2016  . Chronic kidney disease, stage  V ([place]) 04/17/2014  . Primary hypertension 12/25/2009    Past Surgical History:  Procedure Laterality Date  . AV FISTULA PLACEMENT Left 02/25/2020   Procedure: ARTERIOVENOUS (AV) FISTULA CREATION LEFT;  Surgeon: Elam Dutch, MD;  Location: [facility];  Service: Vascular;  Laterality: Left;  . COLONOSCOPY    . CYSTOSCOPY WITH LITHOLAPAXY N/A 11/19/2019   Procedure: CYSTOSCOPY WITH LITHOLAPAXY;  Surgeon: Lucas Mallow, MD;  Location[facility];  Service: Urology;  Laterality: N/A;  . left knee melanoma removed  2018  . MELANOMA EXCISION     left side of neck  . POLYPECTOMY    . RHINOPLASTY     x2        Family History  Problem Relation Age of Onset  . Cancer Father 33       Renal CA  . Cancer Sister        Brain   . Cancer Brother        Brain  . Colon cancer Neg Hx   . Stomach cancer Neg Hx   . Colon polyps Neg Hx   . Esophageal cancer Neg Hx   . Rectal cancer Neg Hx     Social History   Tobacco Use  . Smoking status: Former Smoker    Types: Cigarettes    Quit date: 12/06/1966    Years since quitting: 53.3  . Smokeless tobacco: Never Used  Vaping Use  . Vaping Use: Never used  Substance Use Topics  . Alcohol use: Not Currently    Alcohol/week: 0.0 standard drinks  . Drug use: No    Home Medications Prior to Admission medications   Medication Sig Start Date End Date Taking? Authorizing Provider  Acetylcysteine (N-ACETYL-L-CYSTEINE PO) Take 1 tablet by mouth daily.    [provider]  ALPHA LIPOIC ACID PO Take 1 capsule by mouth daily.    [provider]  Ascorbic Acid (VITAMIN C PO) Take 1 tablet by mouth daily.    [provider]  b complex vitamins tablet Take 1 tablet by mouth daily.    [provider]  Barberry-Oreg Grape-Goldenseal (BERBERINE COMPLEX PO) Take 1 tablet by mouth 3 (three) times daily before meals.     [provider]  Ca Carb-Glucos-Chond-Phelloden (FLEXI JOINT RELIEF PLUS PO) Take 1 tablet by mouth 2 (two) times daily.    [provider]  Chromium Picolinate (CHROMIUM PICOLATE PO) Take 1 capsule by mouth daily.    [provider]  CINNAMON PO Take 1 capsule by mouth in the morning and at bedtime.    [provider]  Epoetin Alfa (PROCRIT IJ) Inject 1 Dose as directed every 14 (fourteen) days.     [provider]  GINKGO BILOBA PO Take 1 tablet by mouth daily.    [provider]    Glucosamine-Chondroitin (OSTEO BI-FLEX REGULAR STRENGTH PO) Take 1 tablet by mouth 2 (two) times daily.    [provider]  Glycerin-Hypromellose-PEG 400 (DRY EYE RELIEF DROPS OP) Place 1 drop into the right eye daily as needed (dry eye).    [provider]  loperamide (IMODIUM A-D) 2 MG tablet Take 2 mg by mouth 4 (four) times daily as needed for diarrhea or loose stools.    [provider]  MAGNESIUM PO Take 1 tablet by mouth daily.    [provider]  Melatonin 10 MG CAPS Take 10 mg by mouth at bedtime.    [provider]  Misc Natural Products (TURMERIC CURCUMIN) CAPS Take 1 capsule by mouth daily.    [provider]  Multiple Vitamins-Minerals (MENS MULTIVITAMIN PLUS) TABS Take 4 tablets by mouth 2 (two) times daily.     [provider]  Multiple Vitamins-Minerals (ZINC PO) Take 1 capsule by mouth daily.    [provider]  Omega-3 Fatty Acids (FISH OIL PO) Take 1 tablet by mouth 2 (two) times daily.     [provider]  OVER THE COUNTER MEDICATION Take 1 tablet by mouth 3 (three) times daily before meals. Glucose essentials otc supplement    [provider]  OVER THE COUNTER MEDICATION Take 1 tablet by mouth 3 (three) times daily before meals. Eagle eyes otc supplement    [provider]  OVER THE COUNTER MEDICATION Take 1 tablet by mouth 2 (two) times daily. Stem cell maxum otc supplement    [provider]  OVER THE COUNTER MEDICATION Take 1 tablet by mouth 2 (two) times daily. Circulation otc supplement    [provider]  Oxymetazoline HCl (VICKS SINEX 12 HOUR NA) Place 1 spray into the nose daily as needed (congestion).    [provider]  SELENIUM PO Take 1 capsule by mouth daily.    [provider]  traMADol (ULTRAM) 50 MG tablet Take 1 tablet (50 mg total) by mouth every 6 (six) hours as needed. 02/25/20   Ulyses Amor, PA-C  VITAMIN D PO Take 1  capsule by mouth daily.    [provider]  VITAMIN E PO Take 1 capsule by mouth daily.    [provider]    Allergies    Patient has no known allergies.  Review of Systems   Review of Systems Ten systems are reviewed and are negative for acute change except as noted in the HPI  Physical Exam Updated Vital Signs BP 134/84   Pulse 91   Temp 98 F (36.7 C) (Oral)   Resp 16   Ht 5\' 11"  (1.803 m)   Wt 63.5 kg   SpO2 99%   BMI 19.53 kg/m   Physical Exam Constitutional:      General: He is not in acute distress.    Appearance: Normal appearance. He is well-developed. He is not ill-appearing or diaphoretic.  HENT:     Head: Normocephalic and atraumatic.  Eyes:     General: Vision grossly intact. Gaze aligned appropriately.     Pupils: Pupils are equal, round, and reactive to light.  Neck:     Trachea: Trachea and phonation normal.  Cardiovascular:     Rate and Rhythm: Regular rhythm. Tachycardia present.  Pulmonary:     Effort: Pulmonary effort is normal. No respiratory distress.     Breath sounds: Normal breath sounds.  Abdominal:     General: There is no distension.     Palpations: Abdomen is soft.     Tenderness: There is no abdominal tenderness. There is no guarding or rebound.  Musculoskeletal:        General: Normal range of motion.     Cervical back: Normal range of motion.     Right lower leg: No tenderness. No edema.     Left lower leg: No tenderness. No edema.  Skin:    General: Skin is warm and dry.  Neurological:     Mental Status: He is alert.     GCS: GCS eye subscore is 4. GCS verbal subscore is 5. GCS motor subscore is 6.  Comments: Speech is clear and goal oriented, follows commands Major Cranial nerves without deficit, no facial droop Moves extremities without ataxia, coordination intact  Psychiatric:        Behavior: Behavior normal.     ED Results / Procedures / Treatments   Labs (all labs ordered are listed, but only  abnormal results are displayed) Labs Reviewed  GLUCOSE, CAPILLARY - Abnormal; Notable for the following components:      Result Value   Glucose-Capillary 170 (*)    All other components within normal limits  CBC WITH DIFFERENTIAL/PLATELET - Abnormal; Notable for the following components:   RBC 3.37 (*)    Hemoglobin 10.6 (*)    HCT 35.2 (*)    MCV 104.5 (*)    RDW 15.9 (*)    Lymphs Abs 0.5 (*)    All other components within normal limits  BASIC METABOLIC PANEL - Abnormal; Notable for the following components:   Sodium 134 (*)    CO2 17 (*)    Glucose, Bld 202 (*)    BUN 80 (*)    Creatinine, Ser 6.27 (*)    GFR, Estimated 7 (*)    All other components within normal limits  HEPATIC FUNCTION PANEL - Abnormal; Notable for the following components:   AST 74 (*)    All other components within normal limits  BRAIN NATRIURETIC PEPTIDE - Abnormal; Notable for the following components:   B Natriuretic Peptide 4,445.6 (*)    All other components within normal limits  TROPONIN I (HIGH SENSITIVITY) - Abnormal; Notable for the following components:   Troponin I (High Sensitivity) 7,391 (*)    All other components within normal limits  RESPIRATORY PANEL BY RT PCR (FLU A&B, COVID)  LIPASE, BLOOD  CBC  HEPARIN LEVEL (UNFRACTIONATED)  TROPONIN I (HIGH SENSITIVITY)    EKG EKG Interpretation  Date/Time:  Saturday March 15 2020 15:16:42 EDT Ventricular Rate:  92 PR Interval:    QRS Duration: 104 QT Interval:  400 QTC Calculation: 495 R Axis:   56 Text Interpretation: Sinus rhythm Minimal ST depression, lateral leads 12 Lead; Mason-Likar since last tracing no significant change Confirmed by Daleen Bo [phone]) on 03/29/2020 3:37:12 PM   Radiology DG Chest 2 View  Result Date: 04/01/2020 CLINICAL DATA:  Shortness of breath EXAM: CHEST - 2 VIEW COMPARISON:  July 19, 2016 FINDINGS: The cardiomediastinal silhouette is unchanged in contour.Atherosclerotic calcifications of the  aorta. There isa small RIGHT pleural effusion. No pneumothorax. No acute pleuroparenchymal abnormality. Visualized abdomen is unremarkable. Mild degenerative changes of the thoracic spine. IMPRESSION: Small RIGHT pleural effusion. Electronically Signed   By: Valentino Saxon MD   On: 04/04/2020 15:53   CT Head Wo Contrast  Result Date: 04/03/2020 CLINICAL DATA:  86 year old male with syncope. EXAM: CT HEAD WITHOUT CONTRAST TECHNIQUE: Contiguous axial images were obtained from the base of the skull through the vertex without intravenous contrast. COMPARISON:  None. FINDINGS: Brain: There is mild age-related atrophy and chronic microvascular ischemic changes. Subcentimeter right basal ganglia hypodense foci, likely old lacunar infarcts. There is no acute intracranial hemorrhage. No mass effect or midline shift. No extra-axial fluid collection. Vascular: No hyperdense vessel or unexpected calcification. Skull: Normal. Negative for fracture or focal lesion. Sinuses/Orbits: No acute finding. Other: None IMPRESSION: 1. No acute intracranial pathology. 2. Mild age-related atrophy and chronic microvascular ischemic changes. Electronically Signed   By: Anner Crete M.D.   On: 03/29/2020 17:18    Procedures .Critical Care Performed by: Nuala Alpha  A, PA-C Authorized by: Deliah Boston, PA-C   Critical care provider statement:    Critical care time (minutes):  40   Critical care was time spent personally by me on the following activities:  Discussions with consultants, evaluation of patient's response to treatment, examination of patient, ordering and performing treatments and interventions, ordering and review of laboratory studies, ordering and review of radiographic studies, pulse oximetry, re-evaluation of patient's condition, obtaining history from patient or surrogate, review of old charts and development of treatment plan with patient or surrogate   (including critical care  time)  Medications Ordered in ED Medications  sodium chloride (PF) 0.9 % injection (has no administration in time range)  heparin ADULT infusion 100 units/mL (25000 units/296mL sodium chloride 0.45%) (1,150 Units/hr Intravenous New Bag/Given 03/09/2020 1711)  sodium chloride 0.9 % bolus 1,000 mL (0 mLs Intravenous Stopped 04/05/2020 1728)  aspirin chewable tablet 324 mg (324 mg Oral Given 03/19/2020 1707)  heparin bolus via infusion 2,000 Units (2,000 Units Intravenous Bolus from Bag 03/27/2020 1708)    ED Course  I have reviewed the triage vital signs and the nursing notes.  Pertinent labs & imaging results that were available during my care of the patient were reviewed by me and considered in my medical decision making (see chart for details).  Clinical Course as of Mar 15 1808  Sat Mar 15, 2020  1641 Dr. Cathie Olden   [BM]  1716 Dr. Jonnie Finner   [BM]    Clinical Course User Index [BM] Gari Crown   MDM Rules/Calculators/A&P                         Additional history obtained from: 1. Nursing notes from this visit. 2. Family at bedside. 3. Electronic medical record review.  It appears patient endorsed nausea vomiting to the urgent care earlier today. ------------------------------ 86 year old male presented with his son today after he had a syncopal episode.  This is a second syncopal episode last 3 days he has had chest pain and shortness of breath as well for the past 3 days no active chest pain.  On arrival he is in no acute distress he is tachycardic with rate around 110 bpm, no hypoxia on room air.  No evidence of significant head injury, no neck pain or back pain.  Cardiopulmonary exam unremarkable.  Neurovascular intact to all 4 extremities without evidence of DVT.  Patient is hard of hearing and his son reports that he has early dementia but patient is fully alert and oriented on my exam.  He has no blood thinner use.  Primary concern given chest pain shortness of breath syncope  and tachycardia is for pulmonary embolus, CT angio has been ordered.  I have also ordered chest pain labs including CBC, BMP, troponin, BNP, chest x-ray, EKG and Covid/influenza panel.  Patient denied any abdominal pain nausea or vomiting on my exam but it appears he did endorse that earlier today at the urgent care so I have added lipase and LFTs to work-up. - EKG: Sinus rhythm Minimal ST depression, lateral leads 12 Lead; Mason-Likar since last tracing no significant change Confirmed by Daleen Bo [phone]) on 04/04/2020 3:37:12 PM  CXR:  IMPRESSION:  Small RIGHT pleural effusion.   CBC shows mild anemia of 10.6, no leukocytosis to suggest infection CBG 170. LFTs without emergent elevations. Lipase within normal limits doubt pancreatitis. Covid/influenza panel negative. High-sensitivity troponin significantly elevated at 7391 BNP elevated at 4445 BMP  shows worsening of kidney function with creatinine 6.27 and BUN 80, no emergent lecture derangement, potassium with normal limits.  Patient unable to undergo CT angio chest, will need VQ scan. - 4:41 PM: Spoke with Dr. Acie Fredrickson, agrees with concern for PE advises starting patient on heparin and admission to hospitalist service here at [facility].  Cardiology to see patient tomorrow in rounding.  Consult placed to nephrology. - Patient reassessed he is resting comfortably no acute distress denies any pain or shortness of breath vital signs are stable on room air.  Patient and his son are agreeable for admission.    Patient son reports this patient received third dose COVID-19 vaccine on Tuesday, 03/11/2020. - CT Head:  IMPRESSION:  1. No acute intracranial pathology.  2. Mild age-related atrophy and chronic microvascular ischemic  changes.  - 5:16 PM: Discussed case with nephrologist Dr. Sherryll Burger, agrees with admission and will see patient in rounding. - Concern still for possible PE, patient was started on heparin, awaiting VQ scan may  not happen for several hours due to coverage.  Consult was placed to hospitalist team for admission.  On reassessment he remains chest pain-free vital signs stable on room air. - Discussed case with hospitalist team, patient accepted for admission.   Note: Portions of this report may have been transcribed using voice recognition software. Every effort was made to ensure accuracy; however, inadvertent computerized transcription errors may still be present. Final Clinical Impression(s) / ED Diagnoses Final diagnoses:  Syncope and collapse  Shortness of breath  Chest pain, unspecified type  Elevated troponin    Rx / DC Orders ED Discharge Orders    None       Gari Crown 03/20/2020 1810    Dorie Rank, MD 03/14/2020 2238

## 2020-03-15 NOTE — Consult Note (Signed)
Renal Service Consult Note [facility]  WERNER LABELLA 04/05/2020 Sol Blazing Requesting Physician:  Dr. Posey Pronto  Reason for Consult:  Renal failure HPI: The patient is a 86 y.o. year-old w/ hx of HTN, HL, DM2, CKD 4 presented to UC today w/ c/o chest pain and SOB x 2-3 days.  Also blurred vision and lightheadness and vomiting last night. Pt passed out walking to the car today per the caregiver. After 1 minute he came around. UC sent pt to ED. In ED pt noted also he had syncopal episode at the mall 3 days ago on Wednesday.  Currently pt not having any CP or SOB, has been episodic. ED labs showed Hb 10, BS 170, LFT's ok, COVID neg. Hs trop high at 7391, BNP 4445. Cr up at 6.27 and BUN 80. K+ ok. Concerned for PE but couldn't get CTA due to creatinine so IV heparin started and V/Q scan ordered. CT head negative. Asked to see for CKD.   Pt seen in ED.  Reports intermittent n/v/ dry heaves lasting 1-3 days for last 1-2 months.  10lb wt loss.  No confusion or jerking or somnolence. No sob today.  CP w/ deep breathing.    ROS  denies CP  no joint pain   no HA  no blurry vision  no rash  no diarrhea  no dysuria  no difficulty voiding  no change in urine color    Past Medical History  Past Medical History:  Diagnosis Date   Abnormal MRI, cervical spine 09/29/10   Mildly abnormal MRI cervical spine demonstrating mild spondylosis and disc bulging from C4-5 down to C7-T1.  No spinal stenosis or foraminal narrowing   Anemia    Arthritis    Blood transfusion without reported diagnosis    BPH (benign prostatic hyperplasia)    Cancer (HCC)    possible melanoma on lt.leg   Cataract    beginning stage   Cervical disc herniation 09/29/10   C4-C5 and C7-T1   Chronic kidney disease (CKD)    stage 4 per dr patel nephrology lov 10-11-2019   Chronic left-sided headaches    Colon polyp 2007   Diabetes mellitus    diet dontrolled   Elevated PSA    External  hemorrhoids without mention of complication 5374   GERD (gastroesophageal reflux disease)    History of hypertension    no current meds for   Hydronephrosis    acute renal failure superimposed on stage 4 ckd   Hyperlipidemia    Hypothyroidism 09/29/10   Untreated. Patient not interested in Rx.    Intermittent self-catheterization of bladder    placed every month, foley in all the time   Knee pain, bilateral 02/05/2013   Pain since a fall in 2014   MRI of brain abnormal 09/29/10   Abnormal MRI of brain, demonstrating mild atrophy   Neck pain on left side 2012   Neuropathy    Plantar fasciitis    left foot   Secondary hyperparathyroidism [facility])    Past Surgical History  Past Surgical History:  Procedure Laterality Date   AV FISTULA PLACEMENT Left 02/25/2020   Procedure: ARTERIOVENOUS (AV) FISTULA CREATION LEFT;  Surgeon: Elam Dutch, MD;  Location: [place];  Service: Vascular;  Laterality: Left;   COLONOSCOPY     CYSTOSCOPY WITH LITHOLAPAXY N/A 11/19/2019   Procedure: CYSTOSCOPY WITH LITHOLAPAXY;  Surgeon: Lucas Mallow, MD;  Location: [facility];  Service: Urology;  Laterality:  N/A;   left knee melanoma removed  2018   MELANOMA EXCISION     left side of neck   POLYPECTOMY     RHINOPLASTY     x2    Family History  Family History  Problem Relation Age of Onset   Cancer Father 2       Renal CA   Cancer Sister        Brain    Cancer Brother        Brain   Colon cancer Neg Hx    Stomach cancer Neg Hx    Colon polyps Neg Hx    Esophageal cancer Neg Hx    Rectal cancer Neg Hx    Social History  reports that he quit smoking about 53 years ago. His smoking use included cigarettes. He has never used smokeless tobacco. He reports previous alcohol use. He reports that he does not use drugs. Allergies No Known Allergies Home medications Prior to Admission medications   Medication Sig Start Date End Date Taking? Authorizing  Provider  Acetylcysteine (N-ACETYL-L-CYSTEINE PO) Take 1 tablet by mouth daily.    [provider]  ALPHA LIPOIC ACID PO Take 1 capsule by mouth daily.    [provider]  Ascorbic Acid (VITAMIN C PO) Take 1 tablet by mouth daily.    [provider]  b complex vitamins tablet Take 1 tablet by mouth daily.    [provider]  Barberry-Oreg Grape-Goldenseal (BERBERINE COMPLEX PO) Take 1 tablet by mouth 3 (three) times daily before meals.     [provider]  Ca Carb-Glucos-Chond-Phelloden (FLEXI JOINT RELIEF PLUS PO) Take 1 tablet by mouth 2 (two) times daily.    [provider]  Chromium Picolinate (CHROMIUM PICOLATE PO) Take 1 capsule by mouth daily.    [provider]  CINNAMON PO Take 1 capsule by mouth in the morning and at bedtime.    [provider]  Epoetin Alfa (PROCRIT IJ) Inject 1 Dose as directed every 14 (fourteen) days.     [provider]  GINKGO BILOBA PO Take 1 tablet by mouth daily.    [provider]  Glucosamine-Chondroitin (OSTEO BI-FLEX REGULAR STRENGTH PO) Take 1 tablet by mouth 2 (two) times daily.    [provider]  Glycerin-Hypromellose-PEG 400 (DRY EYE RELIEF DROPS OP) Place 1 drop into the right eye daily as needed (dry eye).    [provider]  loperamide (IMODIUM A-D) 2 MG tablet Take 2 mg by mouth 4 (four) times daily as needed for diarrhea or loose stools.    [provider]  MAGNESIUM PO Take 1 tablet by mouth daily.    [provider]  Melatonin 10 MG CAPS Take 10 mg by mouth at bedtime.    [provider]  Misc Natural Products (TURMERIC CURCUMIN) CAPS Take 1 capsule by mouth daily.    [provider]  Multiple Vitamins-Minerals (MENS MULTIVITAMIN PLUS) TABS Take 4 tablets by mouth 2 (two) times daily.     [provider]  Multiple Vitamins-Minerals (ZINC PO) Take 1 capsule by mouth daily.    [provider]  Omega-3 Fatty Acids (FISH OIL PO) Take 1 tablet by mouth 2 (two) times daily.     [provider]  OVER THE COUNTER MEDICATION Take 1 tablet by mouth 3 (three) times daily before meals. Glucose essentials otc supplement    [provider]  OVER THE COUNTER MEDICATION Take 1 tablet by mouth  3 (three) times daily before meals. Eagle eyes otc supplement    [provider]  OVER THE COUNTER MEDICATION Take 1 tablet by mouth 2 (two) times daily. Stem cell maxum otc supplement    [provider]  OVER THE COUNTER MEDICATION Take 1 tablet by mouth 2 (two) times daily. Circulation otc supplement    [provider]  Oxymetazoline HCl (VICKS SINEX 12 HOUR NA) Place 1 spray into the nose daily as needed (congestion).    [provider]  SELENIUM PO Take 1 capsule by mouth daily.    [provider]  traMADol (ULTRAM) 50 MG tablet Take 1 tablet (50 mg total) by mouth every 6 (six) hours as needed. 02/25/20   Ulyses Amor, PA-C  VITAMIN D PO Take 1 capsule by mouth daily.    [provider]  VITAMIN E PO Take 1 capsule by mouth daily.    [provider]     Vitals:   03/07/2020 1514 04/06/2020 1545 03/07/2020 1600 03/22/2020 1711  BP: (!) 98/59 114/72 115/77 122/87  Pulse: 92 89 95 89  Resp: 18 18 (!) 24 16  Temp: 98 F (36.7 C)     TempSrc: Oral     SpO2: 98% 96% 94% 100%  Weight:      Height:       Exam Gen elderly WM, not in distress No rash, cyanosis or gangrene Sclera anicteric, throat clear  No jvd or bruits Chest clear bilat to bases RRR no RG, early harsh sem 2/6 rusb Abd soft ntnd no mass or ascites +bs GU normal male MS no joint effusions or deformity Ext 1+pitting bilat pedal edema, no other edema Neuro is alert, Ox 3 , nf, no asterixis  Left forearm RC AVF+bruit   Home meds:  - tramadol qid prn  - prn's/ multiple vitamins & supplements   Date  Creat  eGFR (ml/min)  2008- 2014 0.8- 1.0  2015-  2017 1.10- 2.26   2018  2.9- 4.2   2019  3.6- 4.4 11- 15   2020  3.60- 4.46  June 2021 4.90    July 2021 4.5- 4.13 Jan 2020 5.31  Sept 2021 4.30- 5.60 8 - 12   Oct 9,2021 6.27  7        CXR - FINDINGS: The cardiomediastinal silhouette is unchanged in contour.Atherosclerotic calcifications of the aorta. There isa small RIGHT pleural effusion. No pneumothorax. No acute pleuroparenchymal abnormality. Visualized abdomen is unremarkable. Mild degenerative changes of the thoracic spine. IMPRESSION: Small RIGHT pleural effusion.   ECHO - results pending  Assessment/ Plan: 1. CKD V - b/l creat 4.8- 5.3, eGFR 9- 10 ml/min. Creat today 6.27. Here w/ CP/ SOB episodes and nausea/ vomiting w/ syncope yesterday. Pt has early uremic symptoms w/ intermittent N/V and some wt loss, but does not require urgent dialysis. Not vol depleted, has mild edema on exam. Cardiology are planning heart cath. Pt aware may require dialysis this admission. Will follow. 2. SP recent L forearm AVF placement  3. Chest pain / Cleora Fleet - as above, for heart cath per cardiology. Started on IV hep. No friction rub on exam. 4. Diarrhea - per pmd 5. BP/volume - CXR clear, has pitting pedal edema , no other signs of vol overload. BP's are low-normal, on no BP lowering meds here. Not taking BP meds at home either.       Kelly Splinter  MD 03/28/2020, 5:48 PM  Recent Labs  Lab 03/12/2020  1529  WBC 7.4  HGB 10.6*   Recent Labs  Lab 03/12/2020 1529  K 4.7  BUN 80*  CREATININE 6.27*  CALCIUM 8.9

## 2020-03-15 NOTE — ED Triage Notes (Addendum)
Pt states he has been [facility] since last night and had a syncopal episode this morning. Poor historian. Care giver states he was unresponsive about 5 sec.

## 2020-03-15 NOTE — ED Notes (Addendum)
Patient is being discharged from the Urgent Care and sent to the Emergency Department via pov per family preference, EMS declined. Per Haglar, MD , patient is in need of higher level of care due to LOC/syncope, SOB, abdominal pain, and n/v . Patient is aware and verbalizes understanding of plan of care.  Vitals:   03/27/2020 1440 04/04/2020 1447  BP: 101/60   Pulse: 90   Temp: (!) 97.4 F (36.3 C)   SpO2: 99% 99%

## 2020-03-15 NOTE — H&P (Signed)
[facility] History and Physical   Patient: Richard Moses MVE:[id number]   PCP: Carollee Leitz, MD DOB: 01-09-1933   DOA: 03/22/2020   DOS: 03/14/2020   DOS: the patient was seen and examined on 04/06/2020  Patient coming from: The patient is coming from Home  Chief Complaint: Chest pain and passing out event.  HPI: Richard Moses is a 86 y.o. male with Past medical history of CKD stage V, BPH, melanoma, diet-controlled type II DM.  Presents with complaints of passing out events and right-sided pleuritic chest pain. On 03/12/2020 patient started having worsening shortness of breath on exertion as well as at rest with fatigue.  While walking to the mall on that day but he passed out and woke up on the ground.  He denies having any headache back pain.   This morning had an episode of nausea as well as 2 episodes of vomiting.  Had poor p.o. intake over going for last few days as well. Son who is at bedside states upon walking to the car patient passed out.  Patient denies having any dizziness or lightheadedness at the time of my evaluation but occasionally has dizziness when ambulating. No other focal deficit reported. No cough. Reports 10 pound weight loss in last 6 months which is unintentional. No nausea right now. Has Bell's palsy for many years on the right. No recent change in medication. In September was hospitalized for AV fistula placement as a day procedure only.  No other hospitalization procedures or immobilization.  ED Course: Presents with above complaint. EKG unremarkable. Chest x-ray unremarkable. Troponin elevated. Cardiology was consulted recommended work-up for PE based on the history and empiric treatment for heparin was started in ER. CT head was unremarkable. Patient was referred for admission.  Review of Systems: as mentioned in the history of present illness.  All other systems reviewed and are negative.  Past Medical History:  Diagnosis Date  . Abnormal  MRI, cervical spine 09/29/10   Mildly abnormal MRI cervical spine demonstrating mild spondylosis and disc bulging from C4-5 down to C7-T1.  No spinal stenosis or foraminal narrowing  . Anemia   . Arthritis   . Blood transfusion without reported diagnosis   . BPH (benign prostatic hyperplasia)   . Cancer [facility])    possible melanoma on lt.leg  . Cataract    beginning stage  . Cervical disc herniation 09/29/10   C4-C5 and C7-T1  . Chronic kidney disease (CKD)    stage 4 per dr Biff Rutigliano nephrology lov 10-11-2019  . Chronic left-sided headaches   . Colon polyp 2007  . Diabetes mellitus    diet dontrolled  . Elevated PSA   . External hemorrhoids without mention of complication 2836  . GERD (gastroesophageal reflux disease)   . History of hypertension    no current meds for  . Hydronephrosis    acute renal failure superimposed on stage 4 ckd  . Hyperlipidemia   . Hypothyroidism 09/29/10   Untreated. Patient not interested in Rx.   . Intermittent self-catheterization of bladder    placed every month, foley in all the time  . Knee pain, bilateral 02/05/2013   Pain since a fall in 2014  . MRI of brain abnormal 09/29/10   Abnormal MRI of brain, demonstrating mild atrophy  . Neck pain on left side 2012  . Neuropathy   . Plantar fasciitis    left foot  . Secondary hyperparathyroidism [facility])    Past Surgical History:  Procedure Laterality Date  .  AV FISTULA PLACEMENT Left 02/25/2020   Procedure: ARTERIOVENOUS (AV) FISTULA CREATION LEFT;  Surgeon: Elam Dutch, MD;  Location: [place];  Service: Vascular;  Laterality: Left;  . COLONOSCOPY    . CYSTOSCOPY WITH LITHOLAPAXY N/A 11/19/2019   Procedure: CYSTOSCOPY WITH LITHOLAPAXY;  Surgeon: Lucas Mallow, MD;  Location: [facility];  Service: Urology;  Laterality: N/A;  . left knee melanoma removed  2018  . MELANOMA EXCISION     left side of neck  . POLYPECTOMY    . RHINOPLASTY     x2    Social History:  reports that he quit  smoking about 53 years ago. His smoking use included cigarettes. He has never used smokeless tobacco. He reports previous alcohol use. He reports that he does not use drugs.  No Known Allergies  Family history reviewed and not pertinent Family History  Problem Relation Age of Onset  . Cancer Father 55       Renal CA  . Cancer Sister        Brain   . Cancer Brother        Brain  . Colon cancer Neg Hx   . Stomach cancer Neg Hx   . Colon polyps Neg Hx   . Esophageal cancer Neg Hx   . Rectal cancer Neg Hx      Prior to Admission medications   Medication Sig Start Date End Date Taking? Authorizing Provider  Acetylcysteine (N-ACETYL-L-CYSTEINE PO) Take 1 tablet by mouth daily.    [provider]  ALPHA LIPOIC ACID PO Take 1 capsule by mouth daily.    [provider]  Ascorbic Acid (VITAMIN C PO) Take 1 tablet by mouth daily.    [provider]  b complex vitamins tablet Take 1 tablet by mouth daily.    [provider]  Barberry-Oreg Grape-Goldenseal (BERBERINE COMPLEX PO) Take 1 tablet by mouth 3 (three) times daily before meals.     [provider]  Ca Carb-Glucos-Chond-Phelloden (FLEXI JOINT RELIEF PLUS PO) Take 1 tablet by mouth 2 (two) times daily.    [provider]  Chromium Picolinate (CHROMIUM PICOLATE PO) Take 1 capsule by mouth daily.    [provider]  CINNAMON PO Take 1 capsule by mouth in the morning and at bedtime.    [provider]  Epoetin Alfa (PROCRIT IJ) Inject 1 Dose as directed every 14 (fourteen) days.     [provider]  GINKGO BILOBA PO Take 1 tablet by mouth daily.    [provider]  Glucosamine-Chondroitin (OSTEO BI-FLEX REGULAR STRENGTH PO) Take 1 tablet by mouth 2 (two) times daily.    [provider]  Glycerin-Hypromellose-PEG 400 (DRY EYE RELIEF DROPS OP) Place 1 drop into the right eye daily as needed (dry eye).    [provider]  loperamide  (IMODIUM A-D) 2 MG tablet Take 2 mg by mouth 4 (four) times daily as needed for diarrhea or loose stools.    [provider]  MAGNESIUM PO Take 1 tablet by mouth daily.    [provider]  Melatonin 10 MG CAPS Take 10 mg by mouth at bedtime.    [provider]  Misc Natural Products (TURMERIC CURCUMIN) CAPS Take 1 capsule by mouth daily.    [provider]  Multiple Vitamins-Minerals (MENS MULTIVITAMIN PLUS) TABS Take 4 tablets by mouth 2 (two) times daily.     [provider]  Multiple Vitamins-Minerals (ZINC PO) Take 1  capsule by mouth daily.    [provider]  Omega-3 Fatty Acids (FISH OIL PO) Take 1 tablet by mouth 2 (two) times daily.     [provider]  OVER THE COUNTER MEDICATION Take 1 tablet by mouth 3 (three) times daily before meals. Glucose essentials otc supplement    [provider]  OVER THE COUNTER MEDICATION Take 1 tablet by mouth 3 (three) times daily before meals. Eagle eyes otc supplement    [provider]  OVER THE COUNTER MEDICATION Take 1 tablet by mouth 2 (two) times daily. Stem cell maxum otc supplement    [provider]  OVER THE COUNTER MEDICATION Take 1 tablet by mouth 2 (two) times daily. Circulation otc supplement    [provider]  Oxymetazoline HCl (VICKS SINEX 12 HOUR NA) Place 1 spray into the nose daily as needed (congestion).    [provider]  SELENIUM PO Take 1 capsule by mouth daily.    [provider]  traMADol (ULTRAM) 50 MG tablet Take 1 tablet (50 mg total) by mouth every 6 (six) hours as needed. 02/25/20   Ulyses Amor, PA-C  VITAMIN D PO Take 1 capsule by mouth daily.    [provider]  VITAMIN E PO Take 1 capsule by mouth daily.    [provider]    Physical Exam: Vitals:   03/17/2020 1600 03/14/2020 1711 03/24/2020 1730 03/20/2020 1800  BP: 115/77 122/87 121/81 134/84  Pulse: 95 89 87 91  Resp: (!) 24 16 14 16     Temp:      TempSrc:      SpO2: 94% 100% 95% 99%  Weight:      Height:        General: alert and oriented to time, place, and person. Appear in mild distress, affect appropriate Eyes: PERRL, Conjunctiva normal ENT: Oral Mucosa Clear, moist  Neck: no JVD, no Abnormal Mass Or lumps Cardiovascular: S1 and S2 Present, aortic systolic  Murmur, peripheral pulses symmetrical Respiratory: good respiratory effort, Bilateral Air entry equal and Decreased, no signs of accessory muscle use, right sided Crackles, no wheezes Abdomen: Bowel Sound present, Soft and no tenderness, no hernia Skin: no rashes  Extremities: bilateral  Pedal edema, no calf tenderness Neurologic: without any new focal findings Gait not checked due to patient safety concerns  Data Reviewed: I have personally reviewed and interpreted labs, imaging as discussed below.  CBC: Recent Labs  Lab 03/20/2020 1529  WBC 7.4  NEUTROABS 6.3  HGB 10.6*  HCT 35.2*  MCV 104.5*  PLT 841   Basic Metabolic Panel: Recent Labs  Lab 03/16/2020 1529  NA 134*  K 4.7  CL 103  CO2 17*  GLUCOSE 202*  BUN 80*  CREATININE 6.27*  CALCIUM 8.9   GFR: Estimated Creatinine Clearance: 7.7 mL/min (A) (by C-G formula based on SCr of 6.27 mg/dL (H)). Liver Function Tests: Recent Labs  Lab 03/10/2020 1529  AST 74*  ALT 25  ALKPHOS 65  BILITOT 0.8  PROT 7.2  ALBUMIN 3.6   Recent Labs  Lab 04/06/2020 1529  LIPASE 30   No results for input(s): AMMONIA in the last 168 hours. Coagulation Profile: No results for input(s): INR, PROTIME in the last 168 hours. Cardiac Enzymes: No results for input(s): CKTOTAL, CKMB, CKMBINDEX, TROPONINI in the last 168 hours. BNP (last 3 results) No results for input(s): PROBNP in the last 8760 hours. HbA1C: No results for input(s): HGBA1C in the last 72 hours.  CBG: Recent Labs  Lab 04/04/2020 1514  GLUCAP 170*   Lipid Profile: No results for input(s): CHOL, HDL, LDLCALC, TRIG, CHOLHDL, LDLDIRECT in  the last 72 hours. Thyroid Function Tests: No results for input(s): TSH, T4TOTAL, FREET4, T3FREE, THYROIDAB in the last 72 hours. Anemia Panel: No results for input(s): VITAMINB12, FOLATE, FERRITIN, TIBC, IRON, RETICCTPCT in the last 72 hours. Urine analysis:    Component Value Date/Time   COLORURINE YELLOW 02/25/2019 0219   APPEARANCEUR CLOUDY (A) 02/25/2019 0219   LABSPEC 1.009 02/25/2019 0219   PHURINE 7.0 02/25/2019 0219   GLUCOSEU NEGATIVE 02/25/2019 0219   HGBUR MODERATE (A) 02/25/2019 0219   BILIRUBINUR NEGATIVE 02/25/2019 0219   BILIRUBINUR NEG 06/04/2016 1549   KETONESUR NEGATIVE 02/25/2019 0219   PROTEINUR 100 (A) 02/25/2019 0219   UROBILINOGEN 0.2 06/04/2016 1549   NITRITE NEGATIVE 02/25/2019 0219   LEUKOCYTESUR LARGE (A) 02/25/2019 0219    Radiological Exams on Admission: DG Chest 2 View  Result Date: 03/12/2020 CLINICAL DATA:  Shortness of breath EXAM: CHEST - 2 VIEW COMPARISON:  July 19, 2016 FINDINGS: The cardiomediastinal silhouette is unchanged in contour.Atherosclerotic calcifications of the aorta. There isa small RIGHT pleural effusion. No pneumothorax. No acute pleuroparenchymal abnormality. Visualized abdomen is unremarkable. Mild degenerative changes of the thoracic spine. IMPRESSION: Small RIGHT pleural effusion. Electronically Signed   By: Valentino Saxon MD   On: 03/31/2020 15:53   CT Head Wo Contrast  Result Date: 03/08/2020 CLINICAL DATA:  86 year old male with syncope. EXAM: CT HEAD WITHOUT CONTRAST TECHNIQUE: Contiguous axial images were obtained from the base of the skull through the vertex without intravenous contrast. COMPARISON:  None. FINDINGS: Brain: There is mild age-related atrophy and chronic microvascular ischemic changes. Subcentimeter right basal ganglia hypodense foci, likely old lacunar infarcts. There is no acute intracranial hemorrhage. No mass effect or midline shift. No extra-axial fluid collection. Vascular: No hyperdense vessel or  unexpected calcification. Skull: Normal. Negative for fracture or focal lesion. Sinuses/Orbits: No acute finding. Other: None IMPRESSION: 1. No acute intracranial pathology. 2. Mild age-related atrophy and chronic microvascular ischemic changes. Electronically Signed   By: Anner Crete M.D.   On: 03/11/2020 17:18   EKG: Independently reviewed. normal sinus rhythm, nonspecific ST and T waves changes. Echocardiogram: Ordered on 03/23/2020.  I reviewed all nursing notes, pharmacy notes, vitals, pertinent old records.  Assessment/Plan 1. Elevated troponin  Potential PE. Potential Non-STEMI. Syncope Presents with complaints of shortness of breath on exertion as well as at rest with progressive worsening symptoms and 2 episodes of syncope. Currently asymptomatic other than pleuritic chest pain on the right. Chest x-ray shows small pleural effusion otherwise no acute abnormality. Troponins are significantly elevated. EKG unremarkable for any acute ischemia. Possibility of PE is very high given presentation. Patient started empirically on heparin in the ER which we will continue. Follow-up on echocardiogram Follow-up on VQ scan.  As well as Doppler. Given history of CKD unable to perform CT chest PE protocol. No focal deficit the time of my evaluation. CT head unremarkable. PT consulted.  2.  Diarrhea Patient reports 1 or 2 loose watery bowel movement which are black in color for last 3 weeks. Patient also reports symptoms of GERD. Currently hemoglobin is steady therefore I do not think that the patient is suffering from GI bleed. Patient is currently on IV heparin. Check FOBT.  Check daily CBC. Frequency not significant enough as well as patient does not appear to have any symptoms of infectious diarrhea therefore no further work-up.  3.  Chronic kidney disease stage V. Renal function stable. Unable to perform CT PE protocol. Patient may also be not a good candidate for IR  intervention for PE should he have a PE. We will monitor for now. Recent admission for AV fistula placement which puts patient at high risk for DVT PE.  4.  Pleural effusion. Small. Monitor.  5.  Dysphagia. Patient reports occasional dysphagia. Currently no evidence of aspiration based on the examination. We will get speech evaluation.  Allow diet.  Nutrition: Cardiac diet n.p.o. after midnight.  Advance goals of care discussion: Full code son will be HCPOA.  Consults: EDP discussed with cardiology as well as nephrology will follow up on the patient tomorrow.  Family Communication: family was present at bedside, at the time of interview.  Opportunity was given to ask question and all questions were answered satisfactorily.   Disposition:  From: Home Likely will need Home on discharge.   Author: Berle Mull, MD [facility] 03/10/2020 6:56 PM   To reach On-call, see care teams to locate the attending and reach out to them via [email or web address]. If 7PM-7AM, please contact night-coverage If you still have difficulty reaching the attending provider, please page the [facility] (Director on Call) for [facility] on amion for assistance.

## 2020-03-16 ENCOUNTER — Inpatient Hospital Stay (HOSPITAL_COMMUNITY): Payer: Medicare HMO

## 2020-03-16 ENCOUNTER — Other Ambulatory Visit: Payer: Self-pay

## 2020-03-16 ENCOUNTER — Encounter (HOSPITAL_COMMUNITY): Payer: Medicare HMO

## 2020-03-16 DIAGNOSIS — I361 Nonrheumatic tricuspid (valve) insufficiency: Secondary | ICD-10-CM

## 2020-03-16 DIAGNOSIS — I214 Non-ST elevation (NSTEMI) myocardial infarction: Secondary | ICD-10-CM | POA: Diagnosis not present

## 2020-03-16 DIAGNOSIS — D631 Anemia in chronic kidney disease: Secondary | ICD-10-CM

## 2020-03-16 DIAGNOSIS — N185 Chronic kidney disease, stage 5: Secondary | ICD-10-CM

## 2020-03-16 DIAGNOSIS — I350 Nonrheumatic aortic (valve) stenosis: Secondary | ICD-10-CM

## 2020-03-16 DIAGNOSIS — R079 Chest pain, unspecified: Secondary | ICD-10-CM | POA: Diagnosis not present

## 2020-03-16 DIAGNOSIS — I5041 Acute combined systolic (congestive) and diastolic (congestive) heart failure: Secondary | ICD-10-CM

## 2020-03-16 DIAGNOSIS — R778 Other specified abnormalities of plasma proteins: Secondary | ICD-10-CM | POA: Diagnosis not present

## 2020-03-16 LAB — COMPREHENSIVE METABOLIC PANEL
ALT: 25 U/L (ref 0–44)
AST: 64 U/L — ABNORMAL HIGH (ref 15–41)
Albumin: 3.3 g/dL — ABNORMAL LOW (ref 3.5–5.0)
Alkaline Phosphatase: 65 U/L (ref 38–126)
Anion gap: 13 (ref 5–15)
BUN: 82 mg/dL — ABNORMAL HIGH (ref 8–23)
CO2: 15 mmol/L — ABNORMAL LOW (ref 22–32)
Calcium: 8.5 mg/dL — ABNORMAL LOW (ref 8.9–10.3)
Chloride: 107 mmol/L (ref 98–111)
Creatinine, Ser: 5.62 mg/dL — ABNORMAL HIGH (ref 0.61–1.24)
GFR, Estimated: 8 mL/min — ABNORMAL LOW (ref >60)
Glucose, Bld: 129 mg/dL — ABNORMAL HIGH (ref 70–99)
Potassium: 4.4 mmol/L (ref 3.5–5.1)
Sodium: 135 mmol/L (ref 135–145)
Total Bilirubin: 0.7 mg/dL (ref 0.3–1.2)
Total Protein: 6.4 g/dL — ABNORMAL LOW (ref 6.5–8.1)

## 2020-03-16 LAB — CBC
HCT: 33 % — ABNORMAL LOW (ref 39.0–52.0)
Hemoglobin: 9.9 g/dL — ABNORMAL LOW (ref 13.0–17.0)
MCH: 31 pg (ref 26.0–34.0)
MCHC: 30 g/dL (ref 30.0–36.0)
MCV: 103.4 fL — ABNORMAL HIGH (ref 80.0–100.0)
Platelets: 127 10*3/uL — ABNORMAL LOW (ref 150–400)
RBC: 3.19 MIL/uL — ABNORMAL LOW (ref 4.22–5.81)
RDW: 15.9 % — ABNORMAL HIGH (ref 11.5–15.5)
WBC: 7.8 10*3/uL (ref 4.0–10.5)
nRBC: 0 % (ref 0.0–0.2)

## 2020-03-16 LAB — ECHOCARDIOGRAM COMPLETE
AR max vel: 0.75 cm2
AV Area VTI: 0.76 cm2
AV Area mean vel: 0.74 cm2
AV Mean grad: 16 mmHg
AV Peak grad: 23.4 mmHg
Ao pk vel: 2.42 m/s
Area-P 1/2: 4.39 cm2
Height: 71 in
S' Lateral: 3.8 cm
Weight: 2240 oz

## 2020-03-16 LAB — HEPARIN LEVEL (UNFRACTIONATED)
Heparin Unfractionated: 0.64 IU/mL (ref 0.30–0.70)
Heparin Unfractionated: 0.63 IU/mL (ref 0.30–0.70)

## 2020-03-16 LAB — PROTIME-INR
INR: 1.6 — ABNORMAL HIGH (ref 0.8–1.2)
Prothrombin Time: 18.7 seconds — ABNORMAL HIGH (ref 11.4–15.2)

## 2020-03-16 LAB — TSH: TSH: 5.149 u[IU]/mL — ABNORMAL HIGH (ref 0.350–4.500)

## 2020-03-16 MED ORDER — ROSUVASTATIN CALCIUM 20 MG PO TABS
20.0000 mg | ORAL_TABLET | Freq: Every day | ORAL | Status: DC
  Administered 2020-03-17 – 2020-03-21 (×5): 20 mg via ORAL
  Filled 2020-03-16 (×5): qty 1

## 2020-03-16 MED ORDER — ASPIRIN EC 81 MG PO TBEC
81.0000 mg | DELAYED_RELEASE_TABLET | Freq: Every day | ORAL | Status: DC
  Administered 2020-03-16 – 2020-03-21 (×5): 81 mg via ORAL
  Filled 2020-03-16 (×6): qty 1

## 2020-03-16 MED ORDER — CHLORHEXIDINE GLUCONATE CLOTH 2 % EX PADS
6.0000 | MEDICATED_PAD | Freq: Every day | CUTANEOUS | Status: DC
  Administered 2020-03-16 – 2020-03-21 (×6): 6 via TOPICAL

## 2020-03-16 NOTE — Progress Notes (Signed)
PROGRESS NOTE    Richard Moses  ERX:[id number] DOB: 1933/04/25 DOA: 03/25/2020 PCP: Carollee Leitz, MD     Brief Narrative:  Richard Moses is an  86 y.o. male with past medical history of CKD stage V, BPH, melanoma, diet-controlled type II DM.    Patient was admitted secondary to syncopal episode.  There was concern for PE, VQ scan was negative.  However, patient's lab work revealed BNP >4000 and troponin >7000.  Cardiology as well as nephrology were consulted.  New events last 24 hours / Subjective: This morning, patient has no complaints on examination.  He denies any chest pain.  Assessment & Plan:   Principal Problem:   Elevated troponin Active Problems:   Primary hypertension   Chronic kidney disease, stage V (HCC)   Anemia associated with stage 5 chronic renal failure (HCC)   Peripheral neuropathy   Secondary hyperparathyroidism (HCC)   Diarrhea   Peripheral artery disease (HCC)   Edema   Right-sided Bell's palsy   Syncope and collapse   Chest pain   Pleural effusion on right   Right AVF (arteriovenous fistula) (HCC)   NSTEMI (non-ST elevated myocardial infarction) ([place])   Syncope -CT head negative -Check orthostatic VS   NSTEMI -Appreciate cardiology, planning for heart cath -Aspirin -IV heparin  Acute combined systolic and diastolic heart failure  -Echo revealed wall motion abnormalities and EF 35 to 40% as well as grade 2 diastolic dysfunction -May require HD   CKD V -Appreciate nephrology, may require HD post-cath   Diabetes mellitus type 2 -Diet controlled  Hyperlipidemia -Started on Crestor    DVT prophylaxis: IV heparin  Code Status: Full code Family Communication: POA at bedside Disposition Plan:  Status is: Inpatient  Remains inpatient appropriate because:Ongoing diagnostic testing needed not appropriate for outpatient work up   Dispo: The patient is from: Home              Anticipated d/c is to: Home              Anticipated d/c  date is: 2 days              Patient currently is not medically stable to d/c.  Cardiology planning for heart cath, patient may require dialysis post cath.  Discussed with cardiology and nephrology.  Patient was transferred to [facility].   Consultants:   Cardiology  Nephrology  Procedures:   None  Antimicrobials:  Anti-infectives (From admission, onward)   None        Objective: Vitals:   03/16/20 0934 03/16/20 0959 03/16/20 1025 03/16/20 1237  BP: 119/84 (!) 117/93 111/70 109/71  Pulse: (!) 108 (!) 101 (!) 102 (!) 114  Resp: 16 17 16 17   Temp:      TempSrc:      SpO2: 95% 96% 95% 92%  Weight:      Height:        Intake/Output Summary (Last 24 hours) at 03/16/2020 1252 Last data filed at 03/20/2020 1728 Gross per 24 hour  Intake 1000 ml  Output --  Net 1000 ml   Filed Weights   03/08/2020 1510  Weight: 63.5 kg    Examination:  General exam: Appears calm and comfortable  Respiratory system: Clear to auscultation. Respiratory effort normal. No respiratory distress. No conversational dyspnea.  Cardiovascular system: S1 & S2 heard, RRR. No murmurs. Gastrointestinal system: Abdomen is nondistended, soft and nontender. Normal bowel sounds heard. Central nervous system: Alert and oriented. No focal  neurological deficits. Speech clear.  Extremities: Symmetric in appearance  Skin: No rashes, lesions or ulcers on exposed skin  Psychiatry: Judgement and insight appear normal. Mood & affect appropriate.   Data Reviewed: I have personally reviewed following labs and imaging studies  CBC: Recent Labs  Lab 03/12/2020 1529 03/16/20 0521  WBC 7.4 7.8  NEUTROABS 6.3  --   HGB 10.6* 9.9*  HCT 35.2* 33.0*  MCV 104.5* 103.4*  PLT 171 580*   Basic Metabolic Panel: Recent Labs  Lab 04/01/2020 1529 03/16/20 0521  NA 134* 135  K 4.7 4.4  CL 103 107  CO2 17* 15*  GLUCOSE 202* 129*  BUN 80* 82*  CREATININE 6.27* 5.62*  CALCIUM 8.9 8.5*   GFR: Estimated  Creatinine Clearance: 8.6 mL/min (A) (by C-G formula based on SCr of 5.62 mg/dL (H)). Liver Function Tests: Recent Labs  Lab 03/17/2020 1529 03/16/20 0521  AST 74* 64*  ALT 25 25  ALKPHOS 65 65  BILITOT 0.8 0.7  PROT 7.2 6.4*  ALBUMIN 3.6 3.3*   Recent Labs  Lab 04/06/2020 1529  LIPASE 30   No results for input(s): AMMONIA in the last 168 hours. Coagulation Profile: Recent Labs  Lab 03/16/20 0521  INR 1.6*   Cardiac Enzymes: No results for input(s): CKTOTAL, CKMB, CKMBINDEX, TROPONINI in the last 168 hours. BNP (last 3 results) No results for input(s): PROBNP in the last 8760 hours. HbA1C: No results for input(s): HGBA1C in the last 72 hours. CBG: Recent Labs  Lab 03/18/2020 1514  GLUCAP 170*   Lipid Profile: No results for input(s): CHOL, HDL, LDLCALC, TRIG, CHOLHDL, LDLDIRECT in the last 72 hours. Thyroid Function Tests: No results for input(s): TSH, T4TOTAL, FREET4, T3FREE, THYROIDAB in the last 72 hours. Anemia Panel: No results for input(s): VITAMINB12, FOLATE, FERRITIN, TIBC, IRON, RETICCTPCT in the last 72 hours. Sepsis Labs: No results for input(s): PROCALCITON, LATICACIDVEN in the last 168 hours.  Recent Results (from the past 240 hour(s))  Respiratory Panel by RT PCR (Flu A&B, Covid) - Nasopharyngeal Swab     Status: None   Collection Time: 03/20/2020  3:29 PM   Specimen: Nasopharyngeal Swab  Result Value Ref Range Status   SARS Coronavirus 2 by RT PCR NEGATIVE NEGATIVE Final    Comment: (NOTE) SARS-CoV-2 target nucleic acids are NOT DETECTED.  The SARS-CoV-2 RNA is generally detectable in upper respiratoy specimens during the acute phase of infection. The lowest concentration of SARS-CoV-2 viral copies this assay can detect is 131 copies/mL. A negative result does not preclude SARS-Cov-2 infection and should not be used as the sole basis for treatment or other patient management decisions. A negative result may occur with  improper specimen  collection/handling, submission of specimen other than nasopharyngeal swab, presence of viral mutation(s) within the areas targeted by this assay, and inadequate number of viral copies (<131 copies/mL). A negative result must be combined with clinical observations, patient history, and epidemiological information. The expected result is Negative.  Fact Sheet for Patients:  PinkCheek.be  Fact Sheet for Healthcare Providers:  GravelBags.it  This test is no t yet approved or cleared by the [place] FDA and  has been authorized for detection and/or diagnosis of SARS-CoV-2 by FDA under an Emergency Use Authorization (EUA). This EUA will remain  in effect (meaning this test can be used) for the duration of the COVID-19 declaration under Section 564(b)(1) of the Act, 21 U.S.C. section 360bbb-3(b)(1), unless the authorization is terminated or revoked sooner.  Influenza A by PCR NEGATIVE NEGATIVE Final   Influenza B by PCR NEGATIVE NEGATIVE Final    Comment: (NOTE) The Xpert Xpress SARS-CoV-2/FLU/RSV assay is intended as an aid in  the diagnosis of influenza from Nasopharyngeal swab specimens and  should not be used as a sole basis for treatment. Nasal washings and  aspirates are unacceptable for Xpert Xpress SARS-CoV-2/FLU/RSV  testing.  Fact Sheet for Patients: PinkCheek.be  Fact Sheet for Healthcare Providers: GravelBags.it  This test is not yet approved or cleared by the [place] FDA and  has been authorized for detection and/or diagnosis of SARS-CoV-2 by  FDA under an Emergency Use Authorization (EUA). This EUA will remain  in effect (meaning this test can be used) for the duration of the  Covid-19 declaration under Section 564(b)(1) of the Act, 21  U.S.C. section 360bbb-3(b)(1), unless the authorization is  terminated or revoked. Performed at [facility], [place] [address] [place], [place] [address]       Radiology Studies: DG Chest 2 View  Result Date: 03/28/2020 CLINICAL DATA:  Shortness of breath EXAM: CHEST - 2 VIEW COMPARISON:  July 19, 2016 FINDINGS: The cardiomediastinal silhouette is unchanged in contour.Atherosclerotic calcifications of the aorta. There isa small RIGHT pleural effusion. No pneumothorax. No acute pleuroparenchymal abnormality. Visualized abdomen is unremarkable. Mild degenerative changes of the thoracic spine. IMPRESSION: Small RIGHT pleural effusion. Electronically Signed   By: Valentino Saxon MD   On: 03/20/2020 15:53   CT Head Wo Contrast  Result Date: 03/20/2020 CLINICAL DATA:  86 year old male with syncope. EXAM: CT HEAD WITHOUT CONTRAST TECHNIQUE: Contiguous axial images were obtained from the base of the skull through the vertex without intravenous contrast. COMPARISON:  None. FINDINGS: Brain: There is mild age-related atrophy and chronic microvascular ischemic changes. Subcentimeter right basal ganglia hypodense foci, likely old lacunar infarcts. There is no acute intracranial hemorrhage. No mass effect or midline shift. No extra-axial fluid collection. Vascular: No hyperdense vessel or unexpected calcification. Skull: Normal. Negative for fracture or focal lesion. Sinuses/Orbits: No acute finding. Other: None IMPRESSION: 1. No acute intracranial pathology. 2. Mild age-related atrophy and chronic microvascular ischemic changes. Electronically Signed   By: Anner Crete M.D.   On: 04/02/2020 17:18   NM Pulmonary Perfusion  Result Date: 03/24/2020 CLINICAL DATA:  Shortness of breath EXAM: NUCLEAR MEDICINE PERFUSION LUNG SCAN TECHNIQUE: Perfusion images were obtained in multiple projections after intravenous injection of radiopharmaceutical. Ventilation scans intentionally deferred if perfusion scan and chest x-ray adequate for interpretation during COVID 19 epidemic.  RADIOPHARMACEUTICALS:  4.2 mCi Tc-36m MAA IV COMPARISON:  March 15, 2020. FINDINGS: Evaluation limited secondary to inability of patient to raise arms which resulted in artifact. Mildly heterogeneous perfusion bilaterally without focal wedge-shaped defect. IMPRESSION: Low probability for PE. Electronically Signed   By: Valentino Saxon MD   On: 04/04/2020 19:15   ECHOCARDIOGRAM COMPLETE  Result Date: 03/16/2020    ECHOCARDIOGRAM REPORT   Patient Name:   KARINA LENDERMAN Date of Exam: 03/16/2020 Medical Rec #:  [id number]      Height:       71.0 in Accession #:    [id number]     Weight:       140.0 lb Date of Birth:  11-27-1932      BSA:          1.812 m Patient Age:    81 years       BP:  121/75 mmHg Patient Gender: M              HR:           98 bpm. Exam Location:  Inpatient Procedure: 2D Echo Indications:    elevated troponin  History:        Patient has no prior history of Echocardiogram examinations.                 Risk Factors:Diabetes, Dyslipidemia and Former Smoker.  Sonographer:    Jannett Celestine RDCS (AE) Referring Phys: [id number] [facility] M PATEL  Sonographer Comments: no true parasternal window IMPRESSIONS  1. Severe anteroseptal and basal to mid anterior hypokinesis. . Left ventricular ejection fraction, by estimation, is 35 to 40%. The left ventricle has moderately decreased function. The left ventricle demonstrates regional wall motion abnormalities (see scoring diagram/findings for description). There is mild left ventricular hypertrophy of the basal-septal segment. Left ventricular diastolic parameters are consistent with Grade II diastolic dysfunction (pseudonormalization). Elevated left ventricular end-diastolic pressure.  2. Right ventricular systolic function is normal. The right ventricular size is normal. There is normal pulmonary artery systolic pressure.  3. Left atrial size was mildly dilated.  4. The mitral valve is normal in structure. Trivial mitral valve regurgitation. No  evidence of mitral stenosis. Moderate mitral annular calcification.  5. The aortic valve was not well visualized. Aortic valve regurgitation is not visualized. Mild aortic valve stenosis. Aortic valve mean gradient measures 16.0 mmHg. Aortic valve Vmax measures 2.42 m/s.  6. The inferior vena cava is dilated in size with <50% respiratory variability, suggesting right atrial pressure of 15 mmHg. FINDINGS  Left Ventricle: Severe anteroseptal and basal to mid anterior hypokinesis. Left ventricular ejection fraction, by estimation, is 35 to 40%. The left ventricle has moderately decreased function. The left ventricle demonstrates regional wall motion abnormalities. The left ventricular internal cavity size was normal in size. There is mild left ventricular hypertrophy of the basal-septal segment. Left ventricular diastolic parameters are consistent with Grade II diastolic dysfunction (pseudonormalization). Elevated left ventricular end-diastolic pressure. Right Ventricle: The right ventricular size is normal. No increase in right ventricular wall thickness. Right ventricular systolic function is normal. There is normal pulmonary artery systolic pressure. The tricuspid regurgitant velocity is 2.06 m/s, and  with an assumed right atrial pressure of 15 mmHg, the estimated right ventricular systolic pressure is 16.0 mmHg. Left Atrium: Left atrial size was mildly dilated. Right Atrium: Right atrial size was normal in size. Pericardium: There is no evidence of pericardial effusion. Mitral Valve: The mitral valve is normal in structure. Moderate mitral annular calcification. Trivial mitral valve regurgitation. No evidence of mitral valve stenosis. Tricuspid Valve: The tricuspid valve is normal in structure. Tricuspid valve regurgitation is mild . No evidence of tricuspid stenosis. Aortic Valve: The aortic valve was not well visualized. Aortic valve regurgitation is not visualized. Mild aortic stenosis is present. Aortic valve  mean gradient measures 16.0 mmHg. Aortic valve peak gradient measures 23.4 mmHg. Aortic valve area, by VTI  measures 0.76 cm. Pulmonic Valve: The pulmonic valve was normal in structure. Pulmonic valve regurgitation is not visualized. No evidence of pulmonic stenosis. Aorta: The aortic root is normal in size and structure. Venous: The inferior vena cava is dilated in size with less than 50% respiratory variability, suggesting right atrial pressure of 15 mmHg. IAS/Shunts: No atrial level shunt detected by color flow Doppler.  LEFT VENTRICLE PLAX 2D LVIDd:         4.60 cm  Diastology  LVIDs:         3.80 cm  LV e' medial:    6.20 cm/s LV PW:         1.24 cm  LV E/e' medial:  16.9 LV IVS:        1.00 cm  LV e' lateral:   6.64 cm/s LVOT diam:     2.10 cm  LV E/e' lateral: 15.8 LV SV:         39 LV SV Index:   21 LVOT Area:     3.46 cm  LEFT ATRIUM           Index LA diam:      3.90 cm 2.15 cm/m LA Vol (A4C): 53.2 ml 29.36 ml/m  AORTIC VALVE AV Area (Vmax):    0.75 cm AV Area (Vmean):   0.74 cm AV Area (VTI):     0.76 cm AV Vmax:           242.00 cm/s AV Vmean:          193.000 cm/s AV VTI:            0.512 m AV Peak Grad:      23.4 mmHg AV Mean Grad:      16.0 mmHg LVOT Vmax:         52.20 cm/s LVOT Vmean:        41.400 cm/s LVOT VTI:          0.112 m LVOT/AV VTI ratio: 0.22  AORTA Ao Root diam: 3.00 cm MITRAL VALVE                TRICUSPID VALVE MV Area (PHT): 4.39 cm     TR Peak grad:   17.0 mmHg MV Decel Time: 173 msec     TR Vmax:        206.00 cm/s MV E velocity: 105.00 cm/s                             SHUNTS                             Systemic VTI:  0.11 m                             Systemic Diam: 2.10 cm Skeet Latch MD Electronically signed by Skeet Latch MD Signature Date/Time: 03/16/2020/12:36:03 PM    Final       Scheduled Meds: . aspirin EC  81 mg Oral Daily  . feeding supplement (NEPRO CARB STEADY)  237 mL Oral BID BM  . pantoprazole  40 mg Oral Daily  . rosuvastatin  20 mg Oral  Daily   Continuous Infusions: . heparin 1,150 Units/hr (04/03/2020 1711)     LOS: 1 day      Time spent: 45 minutes   Dessa Phi, DO [facility] 03/16/2020, 12:52 PM   Available via Epic secure chat 7am-7pm After these hours, please refer to coverage provider listed on [email or web address]

## 2020-03-16 NOTE — Progress Notes (Signed)
[place] for IV heparin Indication: ?PE with low prob per VQ, NSTEMI  No Known Allergies  Patient Measurements: Height: 5\' 11"  (180.3 cm) Weight: 63.5 kg (140 lb) IBW/kg (Calculated) : 75.3 Heparin Dosing Weight: 63.5 kg  Vital Signs: Temp: 98.3 F (36.8 C) (10/10 0726) Temp Source: Oral (10/10 0726) BP: 111/70 (10/10 1025) Pulse Rate: 102 (10/10 1025)  Labs: Recent Labs    03/14/2020 1529 03/10/2020 1729 03/16/20 0130 03/16/20 0521 03/16/20 0919  HGB 10.6*  --   --  9.9*  --   HCT 35.2*  --   --  33.0*  --   PLT 171  --   --  127*  --   LABPROT  --   --   --  18.7*  --   INR  --   --   --  1.6*  --   HEPARINUNFRC  --   --  0.64  --  0.63  CREATININE 6.27*  --   --  5.62*  --   TROPONINIHS 7,391* 7,160*  --   --   --     Estimated Creatinine Clearance: 8.6 mL/min (A) (by C-G formula based on SCr of 5.62 mg/dL (H)).   Medical History: Past Medical History:  Diagnosis Date  . Abnormal MRI, cervical spine 09/29/10   Mildly abnormal MRI cervical spine demonstrating mild spondylosis and disc bulging from C4-5 down to C7-T1.  No spinal stenosis or foraminal narrowing  . Anemia   . Arthritis   . Blood transfusion without reported diagnosis   . BPH (benign prostatic hyperplasia)   . Cancer [facility])    possible melanoma on lt.leg  . Cataract    beginning stage  . Cervical disc herniation 09/29/10   C4-C5 and C7-T1  . Chronic kidney disease (CKD)    stage 4 per dr patel nephrology lov 10-11-2019  . Chronic left-sided headaches   . Colon polyp 2007  . Diabetes mellitus    diet dontrolled  . Elevated PSA   . External hemorrhoids without mention of complication 9024  . GERD (gastroesophageal reflux disease)   . History of hypertension    no current meds for  . Hydronephrosis    acute renal failure superimposed on stage 4 ckd  . Hyperlipidemia   . Hypothyroidism 09/29/10   Untreated. Patient not interested in Rx.   . Intermittent  self-catheterization of bladder    placed every month, foley in all the time  . Knee pain, bilateral 02/05/2013   Pain since a fall in 2014  . MRI of brain abnormal 09/29/10   Abnormal MRI of brain, demonstrating mild atrophy  . Neck pain on left side 2012  . Neuropathy   . Plantar fasciitis    left foot  . Secondary hyperparathyroidism (HCC)     Medications:  Scheduled:  . aspirin EC  81 mg Oral Daily  . feeding supplement (NEPRO CARB STEADY)  237 mL Oral BID BM  . pantoprazole  40 mg Oral Daily  . rosuvastatin  20 mg Oral Daily   Infusions:  . heparin 1,150 Units/hr (03/27/2020 1711)    Assessment: 86 yo male presented to ED with shortness of breath found to have a new PE. Patient not on anticoag medication PTA. Patient also in renal failure and has elevated troponin.   03/16/2020 Heparin level continues to be therapeutic on current IV heparin rate of 1150 units/hr No bleeding or other issues per notes Hgb 9.9 down, Plts  127 down - monitor closely  Goal of Therapy:  Heparin level 0.3-0.7 units/ml Monitor platelets by anticoagulation protocol: Yes   Plan:   Continue heparin at 1150 units/hr  Daily CBC and heparin level   Adrian Saran, PharmD, BCPS 03/16/2020 11:19 AM

## 2020-03-16 NOTE — ED Notes (Signed)
ED TO INPATIENT HANDOFF REPORT  ED Nurse Name and Phone #: [phone]  S Name/Age/Gender Richard Moses 86 y.o. male Room/Bed: WA07/WA07  Code Status   Code Status: Full Code  Home/SNF/Other Home Patient oriented to: self, place, time and situation Is this baseline? Yes   Triage Complete: Triage complete  Chief Complaint Elevated troponin [R77.8] NSTEMI (non-ST elevated myocardial infarction) [facility]) [I21.4]  Triage Note Pt states he has been [facility] since last night and had a syncopal episode this morning. Poor historian. Care giver states he was unresponsive about 5 sec.     Allergies No Known Allergies  Level of Care/Admitting Diagnosis ED Disposition    ED Disposition Condition [facility] Area: [place] [100100]  Level of Care: Progressive [102]  Admit to Progressive based on following criteria: CARDIOVASCULAR & THORACIC of moderate stability with acute coronary syndrome symptoms/low risk myocardial infarction/hypertensive urgency/arrhythmias/heart failure potentially compromising stability and stable post cardiovascular intervention patients.  May admit patient to [facility] or [facility] if equivalent level of care is available:: No  Covid Evaluation: Confirmed COVID Negative  Diagnosis: NSTEMI (non-ST elevated myocardial infarction) [facility]) [563149]  Admitting Physician: Dessa Phi [phone]  Attending Physician: Dessa Phi [phone]  Estimated length of stay: 3 - 4 days  Certification:: I certify this patient will need inpatient services for at least 2 midnights       B Medical/Surgery History Past Medical History:  Diagnosis Date  . Abnormal MRI, cervical spine 09/29/10   Mildly abnormal MRI cervical spine demonstrating mild spondylosis and disc bulging from C4-5 down to C7-T1.  No spinal stenosis or foraminal narrowing  . Anemia   . Arthritis   . Blood transfusion without reported diagnosis   . BPH (benign prostatic  hyperplasia)   . Cancer [facility])    possible melanoma on lt.leg  . Cataract    beginning stage  . Cervical disc herniation 09/29/10   C4-C5 and C7-T1  . Chronic kidney disease (CKD)    stage 4 per dr patel nephrology lov 10-11-2019  . Chronic left-sided headaches   . Colon polyp 2007  . Diabetes mellitus    diet dontrolled  . Elevated PSA   . External hemorrhoids without mention of complication 7412  . GERD (gastroesophageal reflux disease)   . History of hypertension    no current meds for  . Hydronephrosis    acute renal failure superimposed on stage 4 ckd  . Hyperlipidemia   . Hypothyroidism 09/29/10   Untreated. Patient not interested in Rx.   . Intermittent self-catheterization of bladder    placed every month, foley in all the time  . Knee pain, bilateral 02/05/2013   Pain since a fall in 2014  . MRI of brain abnormal 09/29/10   Abnormal MRI of brain, demonstrating mild atrophy  . Neck pain on left side 2012  . Neuropathy   . Plantar fasciitis    left foot  . Secondary hyperparathyroidism [facility])    Past Surgical History:  Procedure Laterality Date  . AV FISTULA PLACEMENT Left 02/25/2020   Procedure: ARTERIOVENOUS (AV) FISTULA CREATION LEFT;  Surgeon: Elam Dutch, MD;  Location: [place];  Service: Vascular;  Laterality: Left;  . COLONOSCOPY    . CYSTOSCOPY WITH LITHOLAPAXY N/A 11/19/2019   Procedure: CYSTOSCOPY WITH LITHOLAPAXY;  Surgeon: Lucas Mallow, MD;  Location: [facility];  Service: Urology;  Laterality: N/A;  . left knee melanoma removed  2018  .  MELANOMA EXCISION     left side of neck  . POLYPECTOMY    . RHINOPLASTY     x2      A IV Location/Drains/Wounds Patient Lines/Drains/Airways Status    Active Line/Drains/Airways    Name Placement date Placement time Site Days   Peripheral IV 03/30/2020 Right Antecubital 04/06/2020  1544  Antecubital  1   Peripheral IV 03/16/20 Right;Posterior Forearm 03/16/20  1327  Forearm  less than 1   Fistula  / Graft Left Forearm Arteriovenous fistula 02/25/20  0931  Forearm  20   Urethral Catheter Ilene H. RN Coude 20 Fr. 02/25/19  0508  Coude  385   Urethral Catheter dr bell Latex;Straight-tip 20 Fr. 11/19/19  0931  Latex;Straight-tip  118   Incision (Closed) 11/19/19 Perineum Other (Comment) 11/19/19  0834   118   Incision (Closed) 02/25/20 Arm Left 02/25/20  0928   20          Intake/Output Last 24 hours  Intake/Output Summary (Last 24 hours) at 03/16/2020 1401 Last data filed at 03/10/2020 1728 Gross per 24 hour  Intake 1000 ml  Output --  Net 1000 ml    Labs/Imaging Results for orders placed or performed during the hospital encounter of 03/11/2020 (from the past 48 hour(s))  Glucose, capillary     Status: Abnormal   Collection Time: 03/09/2020  3:14 PM  Result Value Ref Range   Glucose-Capillary 170 (H) 70 - 99 mg/dL    Comment: Glucose reference range applies only to samples taken after fasting for at least 8 hours.  CBC with Differential     Status: Abnormal   Collection Time: 03/09/2020  3:29 PM  Result Value Ref Range   WBC 7.4 4.0 - 10.5 K/uL   RBC 3.37 (L) 4.22 - 5.81 MIL/uL   Hemoglobin 10.6 (L) 13.0 - 17.0 g/dL   HCT 35.2 (L) 39 - 52 %   MCV 104.5 (H) 80.0 - 100.0 fL   MCH 31.5 26.0 - 34.0 pg   MCHC 30.1 30.0 - 36.0 g/dL   RDW 15.9 (H) 11.5 - 15.5 %   Platelets 171 150 - 400 K/uL   nRBC 0.0 0.0 - 0.2 %   Neutrophils Relative % 85 %   Neutro Abs 6.3 1.7 - 7.7 K/uL   Lymphocytes Relative 6 %   Lymphs Abs 0.5 (L) 0.7 - 4.0 K/uL   Monocytes Relative 9 %   Monocytes Absolute 0.6 0.1 - 1.0 K/uL   Eosinophils Relative 0 %   Eosinophils Absolute 0.0 0 - 0 K/uL   Basophils Relative 0 %   Basophils Absolute 0.0 0 - 0 K/uL   Immature Granulocytes 0 %   Abs Immature Granulocytes 0.03 0.00 - 0.07 K/uL    Comment: Performed at [facility], [place] [address] [place], [place] [address]  Basic metabolic panel     Status: Abnormal   Collection Time: 03/31/2020   3:29 PM  Result Value Ref Range   Sodium 134 (L) 135 - 145 mmol/L   Potassium 4.7 3.5 - 5.1 mmol/L   Chloride 103 98 - 111 mmol/L   CO2 17 (L) 22 - 32 mmol/L   Glucose, Bld 202 (H) 70 - 99 mg/dL    Comment: Glucose reference range applies only to samples taken after fasting for at least 8 hours.   BUN 80 (H) 8 - 23 mg/dL   Creatinine, Ser 6.27 (H) 0.61 - 1.24 mg/dL   Calcium 8.9 8.9 -  10.3 mg/dL   GFR, Estimated 7 (L) >60 mL/min   Anion gap 14 5 - 15    Comment: Performed at [facility], [place] [address] [place], [place] [address]  Lipase, blood     Status: None   Collection Time: 03/13/2020  3:29 PM  Result Value Ref Range   Lipase 30 11 - 51 U/L    Comment: Performed at [facility], [place] [address] [place], [place] [address]  Hepatic function panel     Status: Abnormal   Collection Time: 03/22/2020  3:29 PM  Result Value Ref Range   Total Protein 7.2 6.5 - 8.1 g/dL   Albumin 3.6 3.5 - 5.0 g/dL   AST 74 (H) 15 - 41 U/L   ALT 25 0 - 44 U/L   Alkaline Phosphatase 65 38 - 126 U/L   Total Bilirubin 0.8 0.3 - 1.2 mg/dL   Bilirubin, Direct 0.1 0.0 - 0.2 mg/dL   Indirect Bilirubin 0.7 0.3 - 0.9 mg/dL    Comment: Performed at [facility], [place] [address], [place], [place] [address]  Troponin I (High Sensitivity)     Status: Abnormal   Collection Time: 03/14/2020  3:29 PM  Result Value Ref Range   Troponin I (High Sensitivity) 7,391 (HH) <18 ng/L    Comment: CRITICAL RESULT CALLED TO, READ BACK BY AND VERIFIED WITH: BANNO,A. RN AT 1632 03/11/2020 MULLINS,T (NOTE) Elevated high sensitivity troponin I (hsTnI) values and significant  changes across serial measurements may suggest ACS but many other  chronic and acute conditions are known to elevate hsTnI results.  Refer to the Links section for chest pain algorithms and additional  guidance. Performed at [facility], [place] [address], [place], [place] [address]    Respiratory Panel by RT PCR (Flu A&B, Covid) - Nasopharyngeal Swab     Status: None   Collection Time: 03/28/2020  3:29 PM   Specimen: Nasopharyngeal Swab  Result Value Ref Range   SARS Coronavirus 2 by RT PCR NEGATIVE NEGATIVE    Comment: (NOTE) SARS-CoV-2 target nucleic acids are NOT DETECTED.  The SARS-CoV-2 RNA is generally detectable in upper respiratoy specimens during the acute phase of infection. The lowest concentration of SARS-CoV-2 viral copies this assay can detect is 131 copies/mL. A negative result does not preclude SARS-Cov-2 infection and should not be used as the sole basis for treatment or other patient management decisions. A negative result may occur with  improper specimen collection/handling, submission of specimen other than nasopharyngeal swab, presence of viral mutation(s) within the areas targeted by this assay, and inadequate number of viral copies (<131 copies/mL). A negative result must be combined with clinical observations, patient history, and epidemiological information. The expected result is Negative.  Fact Sheet for Patients:  PinkCheek.be  Fact Sheet for Healthcare Providers:  GravelBags.it  This test is no t yet approved or cleared by the [place] FDA and  has been authorized for detection and/or diagnosis of SARS-CoV-2 by FDA under an Emergency Use Authorization (EUA). This EUA will remain  in effect (meaning this test can be used) for the duration of the COVID-19 declaration under Section 564(b)(1) of the Act, 21 U.S.C. section 360bbb-3(b)(1), unless the authorization is terminated or revoked sooner.     Influenza A by PCR NEGATIVE NEGATIVE   Influenza B by PCR NEGATIVE NEGATIVE    Comment: (NOTE) The Xpert Xpress SARS-CoV-2/FLU/RSV assay is intended as an aid in  the diagnosis of  influenza from Nasopharyngeal swab specimens and  should not be used as a sole basis for  treatment. Nasal washings and  aspirates are unacceptable for Xpert Xpress SARS-CoV-2/FLU/RSV  testing.  Fact Sheet for Patients: PinkCheek.be  Fact Sheet for Healthcare Providers: GravelBags.it  This test is not yet approved or cleared by the [place] FDA and  has been authorized for detection and/or diagnosis of SARS-CoV-2 by  FDA under an Emergency Use Authorization (EUA). This EUA will remain  in effect (meaning this test can be used) for the duration of the  Covid-19 declaration under Section 564(b)(1) of the Act, 21  U.S.C. section 360bbb-3(b)(1), unless the authorization is  terminated or revoked. Performed at [facility], [place] [address] [place], [place] [address]   Brain natriuretic peptide     Status: Abnormal   Collection Time: 03/13/2020  3:30 PM  Result Value Ref Range   B Natriuretic Peptide 4,445.6 (H) 0.0 - 100.0 pg/mL    Comment: Performed at [facility], [place] [address], [place], [place] [address]  Troponin I (High Sensitivity)     Status: Abnormal   Collection Time: 03/14/2020  5:29 PM  Result Value Ref Range   Troponin I (High Sensitivity) 7,160 (HH) <18 ng/L    Comment: CRITICAL VALUE NOTED.  VALUE IS CONSISTENT WITH PREVIOUSLY REPORTED AND CALLED VALUE. (NOTE) Elevated high sensitivity troponin I (hsTnI) values and significant  changes across serial measurements may suggest ACS but many other  chronic and acute conditions are known to elevate hsTnI results.  Refer to the Links section for chest pain algorithms and additional  guidance. Performed at [facility], [place] [address], [place], [place] [address]   Heparin level (unfractionated)     Status: None   Collection Time: 03/16/20  1:30 AM  Result Value Ref Range   Heparin Unfractionated 0.64 0.30 - 0.70 IU/mL    Comment: (NOTE) If heparin results are below expected values, and patient  dosage has  been confirmed, suggest follow up testing of antithrombin III levels. Performed at [facility], [place] [address], [place], [place] [address]   CBC     Status: Abnormal   Collection Time: 03/16/20  5:21 AM  Result Value Ref Range   WBC 7.8 4.0 - 10.5 K/uL   RBC 3.19 (L) 4.22 - 5.81 MIL/uL   Hemoglobin 9.9 (L) 13.0 - 17.0 g/dL   HCT 33.0 (L) 39 - 52 %   MCV 103.4 (H) 80.0 - 100.0 fL   MCH 31.0 26.0 - 34.0 pg   MCHC 30.0 30.0 - 36.0 g/dL   RDW 15.9 (H) 11.5 - 15.5 %   Platelets 127 (L) 150 - 400 K/uL    Comment: REPEATED TO VERIFY PLATELET COUNT CONFIRMED BY SMEAR SPECIMEN CHECKED FOR CLOTS Immature Platelet Fraction may be clinically indicated, consider ordering this additional test [id number]    nRBC 0.0 0.0 - 0.2 %    Comment: Performed at [facility], [place] [address] [place], [place] [address]  Comprehensive metabolic panel     Status: Abnormal   Collection Time: 03/16/20  5:21 AM  Result Value Ref Range   Sodium 135 135 - 145 mmol/L   Potassium 4.4 3.5 - 5.1 mmol/L   Chloride 107 98 - 111 mmol/L   CO2 15 (L) 22 - 32 mmol/L   Glucose, Bld 129 (H) 70 - 99 mg/dL    Comment: Glucose reference range applies only to samples taken  after fasting for at least 8 hours.   BUN 82 (H) 8 - 23 mg/dL   Creatinine, Ser 5.62 (H) 0.61 - 1.24 mg/dL   Calcium 8.5 (L) 8.9 - 10.3 mg/dL   Total Protein 6.4 (L) 6.5 - 8.1 g/dL   Albumin 3.3 (L) 3.5 - 5.0 g/dL   AST 64 (H) 15 - 41 U/L   ALT 25 0 - 44 U/L   Alkaline Phosphatase 65 38 - 126 U/L   Total Bilirubin 0.7 0.3 - 1.2 mg/dL   GFR, Estimated 8 (L) >60 mL/min   Anion gap 13 5 - 15    Comment: Performed at [facility], [place] [address] [place], [place] [address]  Protime-INR     Status: Abnormal   Collection Time: 03/16/20  5:21 AM  Result Value Ref Range   Prothrombin Time 18.7 (H) 11.4 - 15.2 seconds   INR 1.6 (H) 0.8 - 1.2    Comment: (NOTE) INR goal varies based on  device and disease states. Performed at [facility], [place] [address], [place], [place] [address]   Heparin level (unfractionated)     Status: None   Collection Time: 03/16/20  9:19 AM  Result Value Ref Range   Heparin Unfractionated 0.63 0.30 - 0.70 IU/mL    Comment: (NOTE) If heparin results are below expected values, and patient dosage has  been confirmed, suggest follow up testing of antithrombin III levels. Performed at [facility], [place] [address] [place], [place] [address]    DG Chest 2 View  Result Date: 03/22/2020 CLINICAL DATA:  Shortness of breath EXAM: CHEST - 2 VIEW COMPARISON:  July 19, 2016 FINDINGS: The cardiomediastinal silhouette is unchanged in contour.Atherosclerotic calcifications of the aorta. There isa small RIGHT pleural effusion. No pneumothorax. No acute pleuroparenchymal abnormality. Visualized abdomen is unremarkable. Mild degenerative changes of the thoracic spine. IMPRESSION: Small RIGHT pleural effusion. Electronically Signed   By: Valentino Saxon MD   On: 03/11/2020 15:53   CT Head Wo Contrast  Result Date: 03/26/2020 CLINICAL DATA:  86 year old male with syncope. EXAM: CT HEAD WITHOUT CONTRAST TECHNIQUE: Contiguous axial images were obtained from the base of the skull through the vertex without intravenous contrast. COMPARISON:  None. FINDINGS: Brain: There is mild age-related atrophy and chronic microvascular ischemic changes. Subcentimeter right basal ganglia hypodense foci, likely old lacunar infarcts. There is no acute intracranial hemorrhage. No mass effect or midline shift. No extra-axial fluid collection. Vascular: No hyperdense vessel or unexpected calcification. Skull: Normal. Negative for fracture or focal lesion. Sinuses/Orbits: No acute finding. Other: None IMPRESSION: 1. No acute intracranial pathology. 2. Mild age-related atrophy and chronic microvascular ischemic changes. Electronically Signed   By:  Anner Crete M.D.   On: 03/11/2020 17:18   NM Pulmonary Perfusion  Result Date: 03/14/2020 CLINICAL DATA:  Shortness of breath EXAM: NUCLEAR MEDICINE PERFUSION LUNG SCAN TECHNIQUE: Perfusion images were obtained in multiple projections after intravenous injection of radiopharmaceutical. Ventilation scans intentionally deferred if perfusion scan and chest x-ray adequate for interpretation during COVID 19 epidemic. RADIOPHARMACEUTICALS:  4.2 mCi Tc-57m MAA IV COMPARISON:  March 15, 2020. FINDINGS: Evaluation limited secondary to inability of patient to raise arms which resulted in artifact. Mildly heterogeneous perfusion bilaterally without focal wedge-shaped defect. IMPRESSION: Low probability for PE. Electronically Signed   By: Valentino Saxon MD   On: 03/14/2020 19:15   ECHOCARDIOGRAM COMPLETE  Result Date: 03/16/2020    ECHOCARDIOGRAM REPORT   Patient Name:   Richard Moses Date of Exam: 03/16/2020 Medical Rec #:  [id number]      Height:       71.0 in Accession #:    [id number]     Weight:       140.0 lb Date of Birth:  Nov 11, 1932      BSA:          1.812 m Patient Age:    70 years       BP:           121/75 mmHg Patient Gender: M              HR:           98 bpm. Exam Location:  Inpatient Procedure: 2D Echo Indications:    elevated troponin  History:        Patient has no prior history of Echocardiogram examinations.                 Risk Factors:Diabetes, Dyslipidemia and Former Smoker.  Sonographer:    Jannett Celestine RDCS (AE) Referring Phys: [id number] [facility] M PATEL  Sonographer Comments: no true parasternal window IMPRESSIONS  1. Severe anteroseptal and basal to mid anterior hypokinesis. . Left ventricular ejection fraction, by estimation, is 35 to 40%. The left ventricle has moderately decreased function. The left ventricle demonstrates regional wall motion abnormalities (see scoring diagram/findings for description). There is mild left ventricular hypertrophy of the basal-septal segment. Left  ventricular diastolic parameters are consistent with Grade II diastolic dysfunction (pseudonormalization). Elevated left ventricular end-diastolic pressure.  2. Right ventricular systolic function is normal. The right ventricular size is normal. There is normal pulmonary artery systolic pressure.  3. Left atrial size was mildly dilated.  4. The mitral valve is normal in structure. Trivial mitral valve regurgitation. No evidence of mitral stenosis. Moderate mitral annular calcification.  5. The aortic valve was not well visualized. Aortic valve regurgitation is not visualized. Mild aortic valve stenosis. Aortic valve mean gradient measures 16.0 mmHg. Aortic valve Vmax measures 2.42 m/s.  6. The inferior vena cava is dilated in size with <50% respiratory variability, suggesting right atrial pressure of 15 mmHg. FINDINGS  Left Ventricle: Severe anteroseptal and basal to mid anterior hypokinesis. Left ventricular ejection fraction, by estimation, is 35 to 40%. The left ventricle has moderately decreased function. The left ventricle demonstrates regional wall motion abnormalities. The left ventricular internal cavity size was normal in size. There is mild left ventricular hypertrophy of the basal-septal segment. Left ventricular diastolic parameters are consistent with Grade II diastolic dysfunction (pseudonormalization). Elevated left ventricular end-diastolic pressure. Right Ventricle: The right ventricular size is normal. No increase in right ventricular wall thickness. Right ventricular systolic function is normal. There is normal pulmonary artery systolic pressure. The tricuspid regurgitant velocity is 2.06 m/s, and  with an assumed right atrial pressure of 15 mmHg, the estimated right ventricular systolic pressure is 69.6 mmHg. Left Atrium: Left atrial size was mildly dilated. Right Atrium: Right atrial size was normal in size. Pericardium: There is no evidence of pericardial effusion. Mitral Valve: The mitral  valve is normal in structure. Moderate mitral annular calcification. Trivial mitral valve regurgitation. No evidence of mitral valve stenosis. Tricuspid Valve: The tricuspid valve is normal in structure. Tricuspid valve regurgitation is mild . No evidence of tricuspid stenosis. Aortic Valve: The aortic valve was not well visualized. Aortic valve regurgitation is not visualized. Mild aortic stenosis is present. Aortic valve mean gradient measures 16.0 mmHg. Aortic valve peak gradient measures 23.4  mmHg. Aortic valve area, by VTI  measures 0.76 cm. Pulmonic Valve: The pulmonic valve was normal in structure. Pulmonic valve regurgitation is not visualized. No evidence of pulmonic stenosis. Aorta: The aortic root is normal in size and structure. Venous: The inferior vena cava is dilated in size with less than 50% respiratory variability, suggesting right atrial pressure of 15 mmHg. IAS/Shunts: No atrial level shunt detected by color flow Doppler.  LEFT VENTRICLE PLAX 2D LVIDd:         4.60 cm  Diastology LVIDs:         3.80 cm  LV e' medial:    6.20 cm/s LV PW:         1.24 cm  LV E/e' medial:  16.9 LV IVS:        1.00 cm  LV e' lateral:   6.64 cm/s LVOT diam:     2.10 cm  LV E/e' lateral: 15.8 LV SV:         39 LV SV Index:   21 LVOT Area:     3.46 cm  LEFT ATRIUM           Index LA diam:      3.90 cm 2.15 cm/m LA Vol (A4C): 53.2 ml 29.36 ml/m  AORTIC VALVE AV Area (Vmax):    0.75 cm AV Area (Vmean):   0.74 cm AV Area (VTI):     0.76 cm AV Vmax:           242.00 cm/s AV Vmean:          193.000 cm/s AV VTI:            0.512 m AV Peak Grad:      23.4 mmHg AV Mean Grad:      16.0 mmHg LVOT Vmax:         52.20 cm/s LVOT Vmean:        41.400 cm/s LVOT VTI:          0.112 m LVOT/AV VTI ratio: 0.22  AORTA Ao Root diam: 3.00 cm MITRAL VALVE                TRICUSPID VALVE MV Area (PHT): 4.39 cm     TR Peak grad:   17.0 mmHg MV Decel Time: 173 msec     TR Vmax:        206.00 cm/s MV E velocity: 105.00 cm/s                              SHUNTS                             Systemic VTI:  0.11 m                             Systemic Diam: 2.10 cm Skeet Latch MD Electronically signed by Skeet Latch MD Signature Date/Time: 03/16/2020/12:36:03 PM    Final     Pending Labs Unresulted Labs (From admission, onward)          Start     Ordered   03/17/20 0500  Heparin level (unfractionated)  Daily,   R      03/16/20 1121   03/17/20 2094  Basic metabolic panel  Tomorrow morning,   R        03/16/20 1300   03/17/20 0500  CBC  Tomorrow morning,   R        03/16/20 1300   03/16/20 0909  Lipid panel  Add-on,   AD        03/16/20 0908   03/16/20 0908  TSH  Once,   STAT        03/16/20 0907   Unscheduled  Occult blood card to lab, stool  As needed,   R      04/03/2020 1848          Vitals/Pain Today's Vitals   03/16/20 1300 03/16/20 1315 03/16/20 1335 03/16/20 1400  BP: 115/75  116/75 119/80  Pulse: (!) 116 (!) 113 (!) 113 (!) 109  Resp: (!) 23 (!) 22 (!) 21 (!) 22  Temp:      TempSrc:      SpO2: (!) 88% 95% 92% 92%  Weight:      Height:      PainSc:        Isolation Precautions No active isolations  Medications Medications  sodium chloride (PF) 0.9 % injection (has no administration in time range)  heparin ADULT infusion 100 units/mL (25000 units/229mL sodium chloride 0.45%) (1,150 Units/hr Intravenous New Bag/Given 03/16/20 1254)  acetaminophen (TYLENOL) tablet 650 mg (has no administration in time range)    Or  acetaminophen (TYLENOL) suppository 650 mg (has no administration in time range)  ondansetron (ZOFRAN) tablet 4 mg (has no administration in time range)    Or  ondansetron (ZOFRAN) injection 4 mg (has no administration in time range)  pantoprazole (PROTONIX) EC tablet 40 mg (40 mg Oral Given 03/16/20 1037)  feeding supplement (NEPRO CARB STEADY) liquid 237 mL (237 mLs Oral New Bag/Given 03/16/20 1037)  traMADol (ULTRAM) tablet 50 mg (has no administration in time range)  aspirin EC  tablet 81 mg (81 mg Oral Given 03/16/20 1037)  rosuvastatin (CRESTOR) tablet 20 mg (has no administration in time range)  sodium chloride 0.9 % bolus 1,000 mL (0 mLs Intravenous Stopped 03/20/2020 1728)  aspirin chewable tablet 324 mg (324 mg Oral Given 03/31/2020 1707)  heparin bolus via infusion 2,000 Units (2,000 Units Intravenous Bolus from Bag 04/04/2020 1708)  technetium albumin aggregated (MAA) injection solution 4.2 millicurie (4.2 millicuries Intravenous Contrast Given 03/09/2020 1845)    Mobility walks Moderate fall risk   Focused Assessments .   R Recommendations: See Admitting Provider Note  Report given to:   Additional Notes: n/a

## 2020-03-16 NOTE — Progress Notes (Signed)
PT Cancellation Note  Patient Details Name: Richard Moses MRN: [id number] DOB: 1932/08/17   Cancelled Treatment:     PT order received but eval deferred.  Patient has elevated troponin and being transferred to [facility] for NSTEMI and heart cath.  PT to follow on [place].   [name] 03/16/2020, 1:24 PM

## 2020-03-16 NOTE — Progress Notes (Signed)
[facility] for IV heparin Indication: pulmonary embolus  No Known Allergies  Patient Measurements: Height: 5\' 11"  (180.3 cm) Weight: 63.5 kg (140 lb) IBW/kg (Calculated) : 75.3 Heparin Dosing Weight: 63.5 kg  Vital Signs: Temp: 98 F (36.7 C) (10/09 1514) Temp Source: Oral (10/09 1514) BP: 119/77 (10/10 0230) Pulse Rate: 98 (10/10 0230)  Labs: Recent Labs    04/04/2020 1529 04/03/2020 1729 03/16/20 0130  HGB 10.6*  --   --   HCT 35.2*  --   --   PLT 171  --   --   HEPARINUNFRC  --   --  0.64  CREATININE 6.27*  --   --   TROPONINIHS 7,391* 7,160*  --     Estimated Creatinine Clearance: 7.7 mL/min (A) (by C-G formula based on SCr of 6.27 mg/dL (H)).   Medical History: Past Medical History:  Diagnosis Date  . Abnormal MRI, cervical spine 09/29/10   Mildly abnormal MRI cervical spine demonstrating mild spondylosis and disc bulging from C4-5 down to C7-T1.  No spinal stenosis or foraminal narrowing  . Anemia   . Arthritis   . Blood transfusion without reported diagnosis   . BPH (benign prostatic hyperplasia)   . Cancer [facility])    possible melanoma on lt.leg  . Cataract    beginning stage  . Cervical disc herniation 09/29/10   C4-C5 and C7-T1  . Chronic kidney disease (CKD)    stage 4 per dr patel nephrology lov 10-11-2019  . Chronic left-sided headaches   . Colon polyp 2007  . Diabetes mellitus    diet dontrolled  . Elevated PSA   . External hemorrhoids without mention of complication 7425  . GERD (gastroesophageal reflux disease)   . History of hypertension    no current meds for  . Hydronephrosis    acute renal failure superimposed on stage 4 ckd  . Hyperlipidemia   . Hypothyroidism 09/29/10   Untreated. Patient not interested in Rx.   . Intermittent self-catheterization of bladder    placed every month, foley in all the time  . Knee pain, bilateral 02/05/2013   Pain since a fall in 2014  . MRI of brain abnormal 09/29/10    Abnormal MRI of brain, demonstrating mild atrophy  . Neck pain on left side 2012  . Neuropathy   . Plantar fasciitis    left foot  . Secondary hyperparathyroidism (HCC)     Medications:  Scheduled:  . feeding supplement (NEPRO CARB STEADY)  237 mL Oral BID BM  . pantoprazole  40 mg Oral Daily  . sodium chloride (PF)       Infusions:  . heparin 1,150 Units/hr (03/10/2020 1711)    Assessment: 86 yo male presented to ED with shortness of breath found to have a new PE. Patient not on anticoag medication PTA. Patient also in renal failure and has elevated troponin.   03/16/2020 HL 0.64, therapeutic No bleeding or other issues per RN  Goal of Therapy:  Heparin level 0.3-0.7 units/ml Monitor platelets by anticoagulation protocol: Yes   Plan:   Continue heparin at 1150 units/hr  Check heparin level 8 hours   Daily CBC  Dolly Rias RPh 03/16/2020, 2:45 AM
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
## 2020-03-16 NOTE — Progress Notes (Signed)
OT Cancellation Note  Patient Details Name: Richard Moses MRN: [id number] DOB: 07-26-1932   Cancelled Treatment:    Reason Eval/Treat Not Completed: Patient not medically ready. Patient has elevated troponin and being transferred to [facility] for NSTEMI and heart cath.  Vanna Sailer L [place] Wachter 03/16/2020, 12:50 PM

## 2020-03-16 NOTE — ED Notes (Signed)
Attempted to call report but the nurse was not available to take report at this time. Spoke to nurse Colletta [place] and she wanted me to go a head and call [facility] for transport regardless of giving report and the nurse will call me back for report.Karen Chafe call for transportation.

## 2020-03-16 NOTE — ED Notes (Signed)
Pt is haviing a ultrasound done

## 2020-03-16 NOTE — Progress Notes (Signed)
  Echocardiogram 2D Echocardiogram has been performed.  Jannett Celestine 03/16/2020, 9:14 AM

## 2020-03-16 NOTE — Consult Note (Signed)
Cardiology Consultation:   Patient ID: Richard Moses MRN: [id number]; DOB: 1932/11/19  Admit date: 03/16/2020 Date of Consult: 03/16/2020  Primary Care Provider: Carollee Leitz, MD [facility] Cardiologist: [facility] Electrophysiologist:  None    Patient Profile:   Richard Moses is a 86 y.o. male with a hx of CKD stage V, diet-controlled DM2, Bell's palsy, hyperlipidemia, hypothyroidism, and history of secondary hyperparathyroidism who is being seen today for the evaluation of syncope and elevated troponin at the request of Dr. Maylene Roes.  History of Present Illness:   Richard Moses is a 86 year old male with past medical history of CKD stage V, diet-controlled DM2, Bell's palsy, hyperlipidemia, hypothyroidism, and history of secondary hyperparathyroidism.  He does not have any prior cardiac history.  Patient underwent left AV fistula creation by Dr. Eden Lathe on 02/25/2020.  His renal function is being followed by Dr. Posey Pronto of [facility].  Recently, he started having increasing dyspnea on exertion and at rest.  On 03/12/2020, he had a passing out spell while walking in the mall.  According to his family member, he has poor oral intake and nausea and vomiting recently.  He also had unexplained weight loss in the past 6 months.  Prior to arrival yesterday, he was walking to other car when he passed out for the second time.  On arrival to [facility] ED, his blood pressure was 98/59.  O2 saturation 96%.  Pulse in the high 80s to low 90s.  His presentation was concerning for PE.  VQ scan came back negative.  Initial troponin was 7391, subsequent high-sensitivity troponin trended down to 7160.  BNP 4445.6.  Creatinine 6.27.  Hemoglobin 10.6.  Cardiology service has been consulted for elevated troponin.  While in the ED, he complained of intermittent pleuritic chest pain on the right side.  Richard Moses reports that his chest pain is pleuritic.  He is not currently having any chest  discomfort.  He has noted some lower extremity edema but denies orthopnea or PND.   Past Medical History:  Diagnosis Date  . Abnormal MRI, cervical spine 09/29/10   Mildly abnormal MRI cervical spine demonstrating mild spondylosis and disc bulging from C4-5 down to C7-T1.  No spinal stenosis or foraminal narrowing  . Anemia   . Arthritis   . Blood transfusion without reported diagnosis   . BPH (benign prostatic hyperplasia)   . Cancer [facility])    possible melanoma on lt.leg  . Cataract    beginning stage  . Cervical disc herniation 09/29/10   C4-C5 and C7-T1  . Chronic kidney disease (CKD)    stage 4 per dr patel nephrology lov 10-11-2019  . Chronic left-sided headaches   . Colon polyp 2007  . Diabetes mellitus    diet dontrolled  . Elevated PSA   . External hemorrhoids without mention of complication 1950  . GERD (gastroesophageal reflux disease)   . History of hypertension    no current meds for  . Hydronephrosis    acute renal failure superimposed on stage 4 ckd  . Hyperlipidemia   . Hypothyroidism 09/29/10   Untreated. Patient not interested in Rx.   . Intermittent self-catheterization of bladder    placed every month, foley in all the time  . Knee pain, bilateral 02/05/2013   Pain since a fall in 2014  . MRI of brain abnormal 09/29/10   Abnormal MRI of brain, demonstrating mild atrophy  . Neck pain on left side 2012  . Neuropathy   .  Plantar fasciitis    left foot  . Secondary hyperparathyroidism [facility])     Past Surgical History:  Procedure Laterality Date  . AV FISTULA PLACEMENT Left 02/25/2020   Procedure: ARTERIOVENOUS (AV) FISTULA CREATION LEFT;  Surgeon: Elam Dutch, MD;  Location: [place];  Service: Vascular;  Laterality: Left;  . COLONOSCOPY    . CYSTOSCOPY WITH LITHOLAPAXY N/A 11/19/2019   Procedure: CYSTOSCOPY WITH LITHOLAPAXY;  Surgeon: Lucas Mallow, MD;  Location: [facility];  Service: Urology;  Laterality: N/A;  . left knee melanoma  removed  2018  . MELANOMA EXCISION     left side of neck  . POLYPECTOMY    . RHINOPLASTY     x2      Home Medications:  Prior to Admission medications   Medication Sig Start Date End Date Taking? Authorizing Provider  Acetylcysteine (N-ACETYL-L-CYSTEINE PO) Take 1 tablet by mouth daily.    [provider]  ALPHA LIPOIC ACID PO Take 1 capsule by mouth daily.    [provider]  Ascorbic Acid (VITAMIN C PO) Take 1 tablet by mouth daily.    [provider]  b complex vitamins tablet Take 1 tablet by mouth daily.    [provider]  Barberry-Oreg Grape-Goldenseal (BERBERINE COMPLEX PO) Take 1 tablet by mouth 3 (three) times daily before meals.     [provider]  Ca Carb-Glucos-Chond-Phelloden (FLEXI JOINT RELIEF PLUS PO) Take 1 tablet by mouth 2 (two) times daily.    [provider]  Chromium Picolinate (CHROMIUM PICOLATE PO) Take 1 capsule by mouth daily.    [provider]  CINNAMON PO Take 1 capsule by mouth in the morning and at bedtime.    [provider]  Epoetin Alfa (PROCRIT IJ) Inject 1 Dose as directed every 14 (fourteen) days.     [provider]  GINKGO BILOBA PO Take 1 tablet by mouth daily.    [provider]  Glucosamine-Chondroitin (OSTEO BI-FLEX REGULAR STRENGTH PO) Take 1 tablet by mouth 2 (two) times daily.    [provider]  Glycerin-Hypromellose-PEG 400 (DRY EYE RELIEF DROPS OP) Place 1 drop into the right eye daily as needed (dry eye).    [provider]  loperamide (IMODIUM A-D) 2 MG tablet Take 2 mg by mouth 4 (four) times daily as needed for diarrhea or loose stools.    [provider]  MAGNESIUM PO Take 1 tablet by mouth daily.    [provider]  Melatonin 10 MG CAPS Take 10 mg by mouth at bedtime.    [provider]  Misc Natural Products (TURMERIC CURCUMIN) CAPS Take 1 capsule by mouth daily.    [provider]  Multiple  Vitamins-Minerals (MENS MULTIVITAMIN PLUS) TABS Take 4 tablets by mouth 2 (two) times daily.     [provider]  Multiple Vitamins-Minerals (ZINC PO) Take 1 capsule by mouth daily.    [provider]  Omega-3 Fatty Acids (FISH OIL PO) Take 1 tablet by mouth 2 (two) times daily.     [provider]  OVER THE COUNTER MEDICATION Take 1 tablet by mouth 3 (three) times daily before meals. Glucose essentials otc supplement    [provider]  OVER THE COUNTER MEDICATION Take 1 tablet by mouth 3 (three) times daily before meals. Eagle eyes otc supplement    [provider]  OVER THE COUNTER MEDICATION Take 1 tablet by mouth 2 (two) times daily. Stem cell maxum  otc supplement    [provider]  OVER THE COUNTER MEDICATION Take 1 tablet by mouth 2 (two) times daily. Circulation otc supplement    [provider]  Oxymetazoline HCl (VICKS SINEX 12 HOUR NA) Place 1 spray into the nose daily as needed (congestion).    [provider]  SELENIUM PO Take 1 capsule by mouth daily.    [provider]  sulfamethoxazole-trimethoprim (BACTRIM DS) 800-160 MG tablet Take 1 tablet by mouth 2 (two) times daily. 03/04/20   [provider]  traMADol (ULTRAM) 50 MG tablet Take 1 tablet (50 mg total) by mouth every 6 (six) hours as needed. 02/25/20   Ulyses Amor, PA-C  VITAMIN D PO Take 1 capsule by mouth daily.    [provider]  VITAMIN E PO Take 1 capsule by mouth daily.    [provider]    Inpatient Medications: Scheduled Meds: . aspirin EC  81 mg Oral Daily  . feeding supplement (NEPRO CARB STEADY)  237 mL Oral BID BM  . pantoprazole  40 mg Oral Daily  . rosuvastatin  20 mg Oral Daily   Continuous Infusions: . heparin 1,150 Units/hr (03/08/2020 1711)   PRN Meds: acetaminophen **OR** acetaminophen, ondansetron **OR** ondansetron (ZOFRAN) IV, traMADol  Allergies:   No Known Allergies  Social History:    Social History   Socioeconomic History  . Marital status: Single    Spouse name: Not on file  . Number of children: 0  . Years of education: Not on file  . Highest education level: Not on file  Occupational History  . Occupation: Education officer, environmental: OTHER  Tobacco Use  . Smoking status: Former Smoker    Types: Cigarettes    Quit date: 12/06/1966    Years since quitting: 53.3  . Smokeless tobacco: Never Used  Vaping Use  . Vaping Use: Never used  Substance and Sexual Activity  . Alcohol use: Not Currently    Alcohol/week: 0.0 standard drinks  . Drug use: No  . Sexual activity: Never  Other Topics Concern  . Not on file  Social History Narrative   Lives with boarder (young [place] man) - he helps with computer work managing his property in [place]. [name] is a Systems developer and an interpreter.      Family lives in [place] where he is from originally       Updated 12/11/2015   Social Determinants of Health   Financial Resource Strain:   . Difficulty of Paying Living Expenses: Not on file  Food Insecurity:   . Worried About Charity fundraiser in the Last Year: Not on file  . Ran Out of Food in the Last Year: Not on file  Transportation Needs:   . Lack of Transportation (Medical): Not on file  . Lack of Transportation (Non-Medical): Not on file  Physical Activity:   . Days of Exercise per Week: Not on file  . Minutes of Exercise per Session: Not on file  Stress:   . Feeling of Stress : Not on file  Social Connections:   . Frequency of Communication with Friends and Family: Not on file  . Frequency of Social Gatherings with Friends and Family: Not on file  . Attends Religious Services: Not on file  . Active Member of Clubs or Organizations: Not on file  . Attends Archivist Meetings: Not on file  . Marital Status: Not on file  Intimate Partner Violence:   .  Fear of Current or Ex-Partner: Not on file  . Emotionally Abused: Not on file  .  Physically Abused: Not on file  . Sexually Abused: Not on file    Family History:    Family History  Problem Relation Age of Onset  . Cancer Father 73       Renal CA  . Cancer Sister        Brain   . Cancer Brother        Brain  . Colon cancer Neg Hx   . Stomach cancer Neg Hx   . Colon polyps Neg Hx   . Esophageal cancer Neg Hx   . Rectal cancer Neg Hx      ROS:  Please see the history of present illness.   All other ROS reviewed and negative.     Physical Exam/Data:   Vitals:   03/16/20 0230 03/16/20 0500 03/16/20 0553 03/16/20 0726  BP: 119/77 117/74  121/75  Pulse: 98 95  98  Resp: 20 19  17   Temp:    98.3 F (36.8 C)  TempSrc:    Oral  SpO2: 93% (!) 88% 98% 96%  Weight:      Height:        Intake/Output Summary (Last 24 hours) at 03/16/2020 0907 Last data filed at 03/11/2020 1728 Gross per 24 hour  Intake 1000 ml  Output --  Net 1000 ml   Last 3 Weights 04/03/2020 02/25/2020 02/19/2020  Weight (lbs) 140 lb 144 lb 138 lb 3.2 oz  Weight (kg) 63.504 kg 65.318 kg 62.687 kg   VS:  BP 121/75 (BP Location: Left Arm)   Pulse 98   Temp 98.3 F (36.8 C) (Oral)   Resp 17   Ht 5\' 11"  (1.803 m)   Wt 63.5 kg   SpO2 96%   BMI 19.53 kg/m  , BMI Body mass index is 19.53 kg/m. GENERAL:  Well appearing HEENT: Pupils equal round and reactive, fundi not visualized, oral mucosa unremarkable NECK:  + jugular venous distention, waveform within normal limits, carotid upstroke brisk and symmetric, no bruits LUNGS:  Clear to auscultation bilaterally HEART:  RRR.  PMI not displaced or sustained,S1 and S2 within normal limits, no S3, no S4, no clicks, no rubs, no murmurs ABD:  Flat, positive bowel sounds normal in frequency in pitch, no bruits, no rebound, no guarding, no midline pulsatile mass, no hepatomegaly, no splenomegaly EXT:  2 plus pulses throughout, 2+ LE edema to above the ankles bilaterally, no cyanosis no clubbing SKIN:  No rashes no nodules NEURO:  Cranial  nerves II through XII grossly intact, motor grossly intact throughout PSYCH:  Cognitively intact, oriented to person place and time   EKG:  The EKG was personally reviewed and demonstrates:  NSR without significant ST-T wave changes  Telemetry:  Telemetry was personally reviewed and demonstrates:  Sinus rhythm.  Occasional PVCs  Relevant CV Studies: Echo pending  Laboratory Data:  High Sensitivity Troponin:   Recent Labs  Lab 03/26/2020 1529 03/22/2020 1729  TROPONINIHS 7,391* 7,160*     Chemistry Recent Labs  Lab 03/27/2020 1529 03/16/20 0521  NA 134* 135  K 4.7 4.4  CL 103 107  CO2 17* 15*  GLUCOSE 202* 129*  BUN 80* 82*  CREATININE 6.27* 5.62*  CALCIUM 8.9 8.5*  GFRNONAA 7* 8*  ANIONGAP 14 13    Recent Labs  Lab 03/09/2020 1529 03/16/20 0521  PROT 7.2 6.4*  ALBUMIN 3.6 3.3*  AST 74* 64*  ALT 25 25  ALKPHOS 65 65  BILITOT 0.8 0.7   Hematology Recent Labs  Lab 03/13/2020 1529 03/16/20 0521  WBC 7.4 7.8  RBC 3.37* 3.19*  HGB 10.6* 9.9*  HCT 35.2* 33.0*  MCV 104.5* 103.4*  MCH 31.5 31.0  MCHC 30.1 30.0  RDW 15.9* 15.9*  PLT 171 127*   BNP Recent Labs  Lab 04/01/2020 1530  BNP 4,445.6*    DDimer No results for input(s): DDIMER in the last 168 hours.   Radiology/Studies:  DG Chest 2 View  Result Date: 03/28/2020 CLINICAL DATA:  Shortness of breath EXAM: CHEST - 2 VIEW COMPARISON:  July 19, 2016 FINDINGS: The cardiomediastinal silhouette is unchanged in contour.Atherosclerotic calcifications of the aorta. There isa small RIGHT pleural effusion. No pneumothorax. No acute pleuroparenchymal abnormality. Visualized abdomen is unremarkable. Mild degenerative changes of the thoracic spine. IMPRESSION: Small RIGHT pleural effusion. Electronically Signed   By: Valentino Saxon MD   On: 03/31/2020 15:53   CT Head Wo Contrast  Result Date: 03/13/2020 CLINICAL DATA:  86 year old male with syncope. EXAM: CT HEAD WITHOUT CONTRAST TECHNIQUE: Contiguous axial  images were obtained from the base of the skull through the vertex without intravenous contrast. COMPARISON:  None. FINDINGS: Brain: There is mild age-related atrophy and chronic microvascular ischemic changes. Subcentimeter right basal ganglia hypodense foci, likely old lacunar infarcts. There is no acute intracranial hemorrhage. No mass effect or midline shift. No extra-axial fluid collection. Vascular: No hyperdense vessel or unexpected calcification. Skull: Normal. Negative for fracture or focal lesion. Sinuses/Orbits: No acute finding. Other: None IMPRESSION: 1. No acute intracranial pathology. 2. Mild age-related atrophy and chronic microvascular ischemic changes. Electronically Signed   By: Anner Crete M.D.   On: 03/26/2020 17:18   NM Pulmonary Perfusion  Result Date: 04/01/2020 CLINICAL DATA:  Shortness of breath EXAM: NUCLEAR MEDICINE PERFUSION LUNG SCAN TECHNIQUE: Perfusion images were obtained in multiple projections after intravenous injection of radiopharmaceutical. Ventilation scans intentionally deferred if perfusion scan and chest x-ray adequate for interpretation during COVID 19 epidemic. RADIOPHARMACEUTICALS:  4.2 mCi Tc-59m MAA IV COMPARISON:  March 15, 2020. FINDINGS: Evaluation limited secondary to inability of patient to raise arms which resulted in artifact. Mildly heterogeneous perfusion bilaterally without focal wedge-shaped defect. IMPRESSION: Low probability for PE. Electronically Signed   By: Valentino Saxon MD   On: 03/31/2020 19:15     Assessment and Plan:   1. Syncope: Head CT negative for acute event.  Overall he is volume overloaded.  However his oral intake is poor and it is possible that orthostasis and intravascular volume depletion contributed.  We will check orthostatic vital signs.  However ischemia and ventricular arrhythmias are more concerning.  Given his NSTEMI, recommend cardiac catheterization.  2. Pleuritic chest pain: Symptoms are atypical for  ischemia.  However given his echo with wall motion abnormalities and elevated high-sensitivity troponin, recommend cardiac catheterization.  Given his CKD 5 this certainly raises his risk for needing dialysis.  Nephrology is seeing him.  May be reasonable to consider dialyzing him after cath.  3. DOE: Small R pleural effusion on CXR. V/Q low probability for PE. Unable to obtain CTA due to poor renal function. Pending echocardiogram.  4. Elevated troponin: EKG showed sinus rhythm without significant ST-T wave changes. Trop 7391 --> 7160.  His echo was being performed while was in the room.  On review of just a couple images, it seems as though he does have reduced systolic function with focal wall motion abnormalities.  Raises  concern for ischemia.  Consider cardiac catheterization with coordination with nephrology as above.  Blood pressure has been too low to add beta-blocker.  5. Elevated BNP: Final echo pending.  He is volume overloaded.  6. DM II: diet controlled  7. HLD: Start rosuvastatin.  Check lipids/CMP in 3 months.   8. Hypothyroidism: Check TSH  9. CKD stage V: Nephrology following.  Fistula maturing.       TIMI Risk Score for Unstable Angina or Non-ST Elevation MI:   The patient's TIMI risk score is 5, which indicates a 26% risk of all cause mortality, new or recurrent myocardial infarction or need for urgent revascularization in the next 14 days.  New York Heart Association (NYHA) Functional Class NYHA Class III        For questions or updates, please contact [facility] Please consult [email or web address] for contact info under

## 2020-03-17 ENCOUNTER — Inpatient Hospital Stay (HOSPITAL_COMMUNITY): Payer: Medicare HMO

## 2020-03-17 ENCOUNTER — Inpatient Hospital Stay (HOSPITAL_COMMUNITY): Admission: RE | Admit: 2020-03-17 | Payer: Medicare HMO | Source: Ambulatory Visit

## 2020-03-17 ENCOUNTER — Other Ambulatory Visit: Payer: Self-pay

## 2020-03-17 DIAGNOSIS — I5041 Acute combined systolic (congestive) and diastolic (congestive) heart failure: Secondary | ICD-10-CM | POA: Diagnosis not present

## 2020-03-17 DIAGNOSIS — Z7189 Other specified counseling: Secondary | ICD-10-CM | POA: Diagnosis not present

## 2020-03-17 DIAGNOSIS — R609 Edema, unspecified: Secondary | ICD-10-CM

## 2020-03-17 DIAGNOSIS — Z515 Encounter for palliative care: Secondary | ICD-10-CM | POA: Diagnosis not present

## 2020-03-17 DIAGNOSIS — R778 Other specified abnormalities of plasma proteins: Secondary | ICD-10-CM | POA: Diagnosis not present

## 2020-03-17 DIAGNOSIS — I214 Non-ST elevation (NSTEMI) myocardial infarction: Secondary | ICD-10-CM | POA: Diagnosis not present

## 2020-03-17 DIAGNOSIS — R570 Cardiogenic shock: Secondary | ICD-10-CM

## 2020-03-17 DIAGNOSIS — N185 Chronic kidney disease, stage 5: Secondary | ICD-10-CM | POA: Diagnosis not present

## 2020-03-17 DIAGNOSIS — R531 Weakness: Secondary | ICD-10-CM

## 2020-03-17 LAB — BASIC METABOLIC PANEL
Anion gap: 16 — ABNORMAL HIGH (ref 5–15)
BUN: 87 mg/dL — ABNORMAL HIGH (ref 8–23)
CO2: 13 mmol/L — ABNORMAL LOW (ref 22–32)
Calcium: 8.3 mg/dL — ABNORMAL LOW (ref 8.9–10.3)
Chloride: 105 mmol/L (ref 98–111)
Creatinine, Ser: 5.72 mg/dL — ABNORMAL HIGH (ref 0.61–1.24)
GFR, Estimated: 8 mL/min — ABNORMAL LOW (ref >60)
Glucose, Bld: 201 mg/dL — ABNORMAL HIGH (ref 70–99)
Potassium: 4.5 mmol/L (ref 3.5–5.1)
Sodium: 134 mmol/L — ABNORMAL LOW (ref 135–145)

## 2020-03-17 LAB — CBC
HCT: 31.6 % — ABNORMAL LOW (ref 39.0–52.0)
Hemoglobin: 9.6 g/dL — ABNORMAL LOW (ref 13.0–17.0)
MCH: 30.7 pg (ref 26.0–34.0)
MCHC: 30.4 g/dL (ref 30.0–36.0)
MCV: 101 fL — ABNORMAL HIGH (ref 80.0–100.0)
Platelets: UNDETERMINED 10*3/uL (ref 150–400)
RBC: 3.13 MIL/uL — ABNORMAL LOW (ref 4.22–5.81)
RDW: 15.5 % (ref 11.5–15.5)
WBC: 10.8 10*3/uL — ABNORMAL HIGH (ref 4.0–10.5)
nRBC: 0 % (ref 0.0–0.2)

## 2020-03-17 LAB — LIPID PANEL
Cholesterol: 173 mg/dL (ref 0–200)
HDL: 64 mg/dL (ref >40)
LDL Cholesterol: 91 mg/dL (ref 0–99)
Total CHOL/HDL Ratio: 2.7 RATIO
Triglycerides: 91 mg/dL (ref <150)
VLDL: 18 mg/dL (ref 0–40)

## 2020-03-17 LAB — HEPARIN LEVEL (UNFRACTIONATED): Heparin Unfractionated: 0.4 IU/mL (ref 0.30–0.70)

## 2020-03-17 MED ORDER — FUROSEMIDE 10 MG/ML IJ SOLN
120.0000 mg | Freq: Two times a day (BID) | INTRAVENOUS | Status: DC
  Administered 2020-03-17 – 2020-03-18 (×2): 120 mg via INTRAVENOUS
  Filled 2020-03-17: qty 10
  Filled 2020-03-17: qty 12
  Filled 2020-03-17: qty 2

## 2020-03-17 MED ORDER — MIDODRINE HCL 5 MG PO TABS
5.0000 mg | ORAL_TABLET | Freq: Three times a day (TID) | ORAL | Status: DC
  Administered 2020-03-17 – 2020-03-19 (×7): 5 mg via ORAL
  Filled 2020-03-17 (×7): qty 1

## 2020-03-17 MED ORDER — MORPHINE SULFATE (PF) 2 MG/ML IV SOLN
1.0000 mg | INTRAVENOUS | Status: DC | PRN
  Administered 2020-03-17: 1 mg via INTRAVENOUS
  Filled 2020-03-17 (×2): qty 1

## 2020-03-17 MED ORDER — FUROSEMIDE 10 MG/ML IJ SOLN
80.0000 mg | Freq: Once | INTRAMUSCULAR | Status: AC
  Administered 2020-03-17: 80 mg via INTRAVENOUS
  Filled 2020-03-17: qty 8

## 2020-03-17 MED ORDER — SODIUM CHLORIDE 0.9% FLUSH
3.0000 mL | Freq: Two times a day (BID) | INTRAVENOUS | Status: DC
  Administered 2020-03-17 – 2020-03-21 (×7): 3 mL via INTRAVENOUS

## 2020-03-17 MED ORDER — ORAL CARE MOUTH RINSE
15.0000 mL | Freq: Two times a day (BID) | OROMUCOSAL | Status: DC
  Administered 2020-03-17 – 2020-03-21 (×7): 15 mL via OROMUCOSAL

## 2020-03-17 NOTE — Plan of Care (Signed)
  Problem: Coping: Goal: Level of anxiety will decrease Outcome: Progressing   Problem: Elimination: Goal: Will not experience complications related to bowel motility Outcome: Progressing Goal: Will not experience complications related to urinary retention Outcome: Progressing   Problem: Safety: Goal: Ability to remain free from injury will improve Outcome: Progressing   

## 2020-03-17 NOTE — Progress Notes (Signed)
   03/17/20 0743  Assess: MEWS Score  Temp 98.1 F (36.7 C)  BP 104/75  Pulse Rate (!) 124  ECG Heart Rate (!) 125  Resp 20  SpO2 94 %  Assess: MEWS Score  MEWS Temp 0  MEWS Systolic 0  MEWS Pulse 2  MEWS RR 0  MEWS LOC 0  MEWS Score 2  MEWS Score Color Yellow  Assess: if the MEWS score is Yellow or Red  Were vital signs taken at a resting state? Yes  Focused Assessment No change from prior assessment  Early Detection of Sepsis Score *See Row Information* Low  MEWS guidelines implemented *See Row Information* Yes  Treat  MEWS Interventions Escalated (See documentation below)  Pain Scale Faces  Faces Pain Scale 2  Pain Type Acute pain  Pain Location Epigastric  Patients Stated Pain Goal 0  Pain Intervention(s) Repositioned  Take Vital Signs  Increase Vital Sign Frequency  Yellow: Q 2hr X 2 then Q 4hr X 2, if remains yellow, continue Q 4hrs  Escalate  MEWS: Escalate Yellow: discuss with charge nurse/RN and consider discussing with provider and RRT  Notify: Charge Nurse/RN  Name of Charge Nurse/RN Notified christy  Date Charge Nurse/RN Notified 03/17/20  Time Charge Nurse/RN Notified 0803  Document  Patient Outcome Other (Comment) (No intervention)  Progress note created (see row info) Yes

## 2020-03-17 NOTE — Evaluation (Signed)
Physical Therapy Evaluation Patient Details Name: Richard Moses MRN: [id number] DOB: 1932-09-20 Today's Date: 03/17/2020   History of Present Illness  86yo male presenting with recent recurrent syncopal events, pleuritic chest pain, and increasing SOB/DOE. Also with recent unintentional weight loss. CTH and CXR clear, per imaging report also low risk for PE. High sensitivity troponins found to be >7000. Admitted wtih concerns of elevated troponins and potential NSTEMI. PMH knee pain, HLD, permanent foley cath, HTN, DM, CKD, CA  Clinical Impression   Patient received in bed, very pleasant but anxious regarding his current situation and perseverating on if he wants to start dialysis or not at his age; encouraged him to keep openly discussing his concerns with MDs and family/caregivers to help him make this decision with best information possible. Evaluation limited by elevated HR as well as arrival of US/imaging staff mid-session. Demonstrated significant gross weakness in BUEs and BLEs, also with significant difficulty with even low level mobility, requiring ModA even for rolling in bed. HR elevated to 115 with activity (already at 85% of age predicted HR max).  Left positioned to comfort with all needs met, imaging tech present and attending. PT follow-up will be heavily influenced by what patient and medical team decide moving forward in terms of interventions- current recommendations are SNF and 24/7A  versus potentially home with hospice care.     Follow Up Recommendations SNF;Supervision/Assistance - 24 hour;Other (comment) (versus home with hospice)    Equipment Recommendations  Rolling walker with 5" wheels;3in1 (PT)    Recommendations for Other Services       Precautions / Restrictions Precautions Precautions: Fall;Other (comment) Precaution Comments: watch HR/O2/BP Restrictions Weight Bearing Restrictions: No      Mobility  Bed Mobility Overal bed mobility: Needs  Assistance Bed Mobility: Rolling Rolling: Mod assist         General bed mobility comments: ModA for rolling side to side in bed, very effortful and needed railing even with PT assist  Transfers                 General transfer comment: deferred- elevated HR and [place] tech arrival  Ambulation/Gait             General Gait Details: deferred- HR elevation and [place] tech arrival  [employer] Mobility    Modified Rankin (Stroke Patients Only)       Balance                                             Pertinent Vitals/Pain Pain Assessment: Faces Faces Pain Scale: Hurts a little bit Pain Location: generalized discomfort Pain Descriptors / Indicators: Discomfort Pain Intervention(s): Limited activity within patient's tolerance;Monitored during session    Home Living Family/patient expects to be discharged to:: Private residence Living Arrangements: Non-relatives/Friends (caregiver, [name]) Available Help at Discharge: Other (Comment) (caregiver, anthony) Type of Home: House Home Access: Stairs to enter Entrance Stairs-Rails: Right Entrance Stairs-Number of Steps: unclear on # of steps, but does have right rail Home Layout: Two level Home Equipment: Cane - single point      Prior Function Level of Independence: Independent with assistive device(s)               Hand Dominance        Extremity/Trunk Assessment   Upper Extremity  Assessment Upper Extremity Assessment: Defer to OT evaluation    Lower Extremity Assessment Lower Extremity Assessment: Generalized weakness;RLE deficits/detail;LLE deficits/detail RLE Deficits / Details: ankle dorsiflexion 4/5 supine, hip flexor 3/5, quad 3-/5 supine LLE Deficits / Details: ankle dorsiflexion 4/5 supine, hip flexor 3/5, quad 3-/5 supine    Cervical / Trunk Assessment Cervical / Trunk Assessment: Kyphotic  Communication   Communication: No difficulties   Cognition Arousal/Alertness: Awake/alert Behavior During Therapy: Anxious Overall Cognitive Status: Within Functional Limits for tasks assessed                                 General Comments: generally anxious about his health and what is going to happen- reports he just has a lot on his mind as he is not sure he wants to go on dialysis at his age      General Comments General comments (skin integrity, edema, etc.): unable to get to EOB at eval- limited by arrival of other necessary services, will attempt during next treatment session    Exercises     Assessment/Plan    PT Assessment Patient needs continued PT services  PT Problem List Decreased strength;Decreased knowledge of use of DME;Decreased activity tolerance;Decreased balance;Decreased mobility;Decreased coordination       PT Treatment Interventions DME instruction;Balance training;Gait training;Stair training;Functional mobility training;Patient/family education;Therapeutic activities;Wheelchair mobility training;Therapeutic exercise    PT Goals (Current goals can be found in the Care Plan section)  Acute Rehab PT Goals Patient Stated Goal: figure out if he wants to do dialysis or not PT Goal Formulation: With patient Time For Goal Achievement: 03/31/20 Potential to Achieve Goals: Fair    Frequency Min 3X/week   Barriers to discharge        Co-evaluation               AM-PAC PT "6 Clicks" Mobility  Outcome Measure Help needed turning from your back to your side while in a flat bed without using bedrails?: A Lot Help needed moving from lying on your back to sitting on the side of a flat bed without using bedrails?: A Lot Help needed moving to and from a bed to a chair (including a wheelchair)?: A Lot Help needed standing up from a chair using your arms (e.g., wheelchair or bedside chair)?: A Lot Help needed to walk in hospital room?: Total Help needed climbing 3-5 steps with a railing? :  Total 6 Click Score: 10    End of Session Equipment Utilized During Treatment: Oxygen Activity Tolerance: Patient limited by fatigue;Treatment limited secondary to medical complications (Comment) (elevated HR) Patient left: in bed;with call bell/phone within reach;Other (comment) (with [place] tech present and attending) Nurse Communication: Mobility status PT Visit Diagnosis: Muscle weakness (generalized) (M62.81);Difficulty in walking, not elsewhere classified (R26.2);Unsteadiness on feet (R26.81)    Time: 5848-3507 PT Time Calculation (min) (ACUTE ONLY): 15 min   Charges:   PT Evaluation $PT Eval Moderate Complexity: 1 Mod          Windell Norfolk, DPT, PN1   Supplemental Physical Therapist [place]    Pager [phone] Acute Rehab Office [phone]

## 2020-03-17 NOTE — Progress Notes (Signed)
Admit: 04/01/2020 LOS: 2  57M NSTEMI, CKD5 now with N?V, vol o/l, acidosis, SOB  Subjective:  Richard Moses Discussed status at length with patient; has uremia, vol o/l and active ACS . Discussed potential value of starting HD now . Discussed real risk of discomfort / suffering with HD at his age . Was fairly independent prior to admission . Pt unclear if he would want HD or not, unable to decide at this time . Given bolus lasix as unable to lie flat . Has sig acidosis HCO3 13, AG 16.  10/10 0701 - 10/11 0700 In: 419.2 [I.V.:419.2] Out: 400 [Urine:400]  Filed Weights   03/20/2020 1510 03/16/20 1553 03/17/20 0437  Weight: 63.5 kg 95.7 kg 67.1 kg    Scheduled Meds: . aspirin EC  81 mg Oral Daily  . Chlorhexidine Gluconate Cloth  6 each Topical Daily  . feeding supplement (NEPRO CARB STEADY)  237 mL Oral BID BM  . mouth rinse  15 mL Mouth Rinse BID  . midodrine  5 mg Oral TID WC  . pantoprazole  40 mg Oral Daily  . rosuvastatin  20 mg Oral Daily   Continuous Infusions: . heparin 1,150 Units/hr (03/17/20 1144)   PRN Meds:.acetaminophen **OR** acetaminophen, morphine injection, ondansetron **OR** ondansetron (ZOFRAN) IV, traMADol  Current Labs: reviewed    Physical Exam:  Blood pressure 99/75, pulse (!) 120, temperature 97.8 F (36.6 C), resp. rate 19, height 5\' 11"  (1.803 m), weight 67.1 kg, SpO2 98 %. Thin, conversant, Tachypneic Tachy regular No sig LEE Nonfocal R RC AVF, thready Thrill  A 1. CKD5, now with uremia, N/V, vol o/l 2. NSTEMI, AoC sCHF; sig RWMA on TEE, Cards hopes for LHC 3. SOB / tachypneic from #2 and #4 4. Metabolic Acidosis prob from #1 5. OA 6. BPH 7. Recent R RC AVF placed 9/20   P . Pt struggling with difficult decision re HD, not sure he would do well but is possible if started . Palliative consult . Inc Lasix to 120 BID . Hold on [facility] given decompensated CHF . Daily weights, Daily Renal Panel, Strict I/Os, Avoid nephrotoxins (NSAIDs, judicious IV  Contrast)   Pearson Grippe MD 03/17/2020, 1:33 PM  Recent Labs  Lab 03/07/2020 1529 03/16/20 0521 03/17/20 0344  NA 134* 135 134*  K 4.7 4.4 4.5  CL 103 107 105  CO2 17* 15* 13*  GLUCOSE 202* 129* 201*  BUN 80* 82* 87*  CREATININE 6.27* 5.62* 5.72*  CALCIUM 8.9 8.5* 8.3*   Recent Labs  Lab 03/14/2020 1529 03/16/20 0521 03/17/20 0344  WBC 7.4 7.8 10.8*  NEUTROABS 6.3  --   --   HGB 10.6* 9.9* 9.6*  HCT 35.2* 33.0* 31.6*  MCV 104.5* 103.4* 101.0*  PLT 171 127* PLATELET CLUMPS NOTED ON SMEAR, UNABLE TO ESTIMATE

## 2020-03-17 NOTE — Progress Notes (Signed)
Lower Ext study completed.  ° °See CVProc for preliminary results.  ° °Diesel Lina, RDMS, RVT ° °

## 2020-03-17 NOTE — H&P (Signed)
Stage V chronic kidney disease presenting with syncope in an 86 year old gentleman associated with mild elevation in troponin I that are flat.

## 2020-03-17 NOTE — Progress Notes (Signed)
Modified Barium Swallow Progress Note  Patient Details  Name: Richard Moses MRN: [id number] Date of Birth: 1932/11/07  Today's Date: 03/17/2020  Modified Barium Swallow completed.  Full report located under Chart Review in the Imaging Section.  Brief recommendations include the following:  Clinical Impression  Pt was seen for MBS, which revealed moderate oropharyngeal dysphagia as characterized by delayed oral transit, reduced epiglottic deflection, instances of aspiration, multiple swallows, and significant pharyngeal residue. Pt was lethargic throughout MBS, frequently reporting how tired he felt. Delayed oral tranit occurred across POs. During the swallow, epiglottic deflection was significantly reduced (nearly nonexistent) as the epiglottis stayed in a fixed, cupped position. Multiple swallows were also observed across POs. Additionally, significant residue was observed in the valleculae and pyriforms across POs. Mild palatal residue was observed with a regular solid. SLP attempted various strategies to address residue, including head turn, chin tuck, liquid wash, volitional cough/swallow, and effortful swallow. Majority of strategies were ineffective, and instances of trace aspiration of thin liquid were observed during the swallow while attempting a head turn. Use of effortful swallow yielded the best results in reducing residue. Recommend dys 3 diet and thin liquid with use of effortful swallow. Crush meds in puree. SLP will f/u acutely to reinforce/educate on use of effortful swallow and check diet tolerance.    Swallow Evaluation Recommendations       SLP Diet Recommendations: Dysphagia 3 (Mech soft) solids;Thin liquid   Liquid Administration via: Straw;Cup   Medication Administration: Crushed with puree   Supervision: Full supervision/cueing for compensatory strategies   Compensations: Minimize environmental distractions;Slow rate;Small sips/bites;Effortful swallow       Oral  Care Recommendations: Oral care BID        Greggory Keen 03/17/2020,9:55 AM

## 2020-03-17 NOTE — Progress Notes (Signed)
PROGRESS NOTE    DIONISIOS RICCI  AST:[id number] DOB: 1932/12/23 DOA: 03/10/2020 PCP: Carollee Leitz, MD    Brief Narrative:  86 year old gentleman with history of stage V chronic kidney disease, diet-controlled diabetes, Bell's past, hypertension hyperlipidemia and hypothyroidism, recent left forearm AV fistula in preparation for dialysis presented to the ER with increasing shortness of breath on exertion and at rest, passing out spell while walking in the mall, poor intake and unexplained weight loss for last 6 months.  Patient had another episode of passing out while walking to the car. In the emergency room blood pressure was 98/59.  Oxygenation 96% on room air.  VQ scan normal.  Initial troponin 7000.  BNP 4000.  Creatinine 6.   Assessment & Plan:   Principal Problem:   Elevated troponin Active Problems:   Primary hypertension   Chronic kidney disease, stage V (HCC)   Anemia associated with stage 5 chronic renal failure (HCC)   Peripheral neuropathy   Secondary hyperparathyroidism (HCC)   Diarrhea   Peripheral artery disease (HCC)   Edema   Right-sided Bell's palsy   Syncope and collapse   Chest pain   Pleural effusion on right   Right AVF (arteriovenous fistula) (HCC)   NSTEMI (non-ST elevated myocardial infarction) ([place])  Syncope: No neurological event.  Suspect orthostasis.  Checking orthostatic but negative today.  Monitor shows sinus tachycardia with no arrhythmias.  Found to have greatly elevated troponins with pleuritic chest pain.  Cardiology following.  Non-STEMI /chest pain/elevated troponins: Echocardiogram with 35 to 40% ejection fraction, grade 2 diastolic dysfunction.  Wall motion abnormalities. Currently on aspirin, statin and on heparin.  Followed by cardiology.  Planning for cardiac cath.  CKD stage V: Followed by nephrology.  Probably will end up on dialysis after cardiac cath.  Type 2 diabetes: Diet controlled at home.  Remains fairly stable.  On insulin  sliding scale.   DVT prophylaxis: Heparin infusion   Code Status: Full code Family Communication: None, will call Disposition Plan: Status is: Inpatient  Remains inpatient appropriate because:Inpatient level of care appropriate due to severity of illness   Dispo: The patient is from: Home              Anticipated d/c is to: Home              Anticipated d/c date is: 3 days              Patient currently is not medically stable to d/c.         Consultants:   Cardiology  Nephrology  Procedures:   None  Antimicrobials:   None   Subjective: Patient seen and examined.  Came back from swallow test and he thinks he did fairly well.  He is hungry. Patient complains of pain mostly on the xiphisternal area, worsened with deep breathing and pleuritic in nature.  Denies any shortness of breath.  Does not use oxygen at home. He is not sure he understands about cardiac cath, not sure whether he is on a schedule.  Objective: Vitals:   03/16/20 2342 03/17/20 0437 03/17/20 0538 03/17/20 0743  BP:  104/76  104/75  Pulse: (!) 111 (!) 116 (!) 119 (!) 124  Resp: 20 (!) 26 (!) 22 20  Temp:  97.8 F (36.6 C)  98.1 F (36.7 C)  TempSrc:  Oral  Oral  SpO2: 92% 95% 95% 94%  Weight:  67.1 kg    Height:        Intake/Output  Summary (Last 24 hours) at 03/17/2020 5852 Last data filed at 03/17/2020 0600 Gross per 24 hour  Intake 419.23 ml  Output 400 ml  Net 19.23 ml   Filed Weights   03/23/2020 1510 03/16/20 1553 03/17/20 0437  Weight: 63.5 kg 95.7 kg 67.1 kg    Examination:  General exam: Frail looking chronically sick gentleman, anxious, not in any distress. Respiratory system: Clear to auscultation.  No added sounds. Cardiovascular system: S1 & S2 heard, RRR. No JVD, murmurs, rubs, gallops or clicks. No pedal edema. Gastrointestinal system: Abdomen is nondistended, soft and nontender. No organomegaly or masses felt. Normal bowel sounds heard. Central nervous system:  Alert and oriented. No focal neurological deficits.  Generalized weakness. Extremities: Patient has left forearm AV fistula with thrill. Skin: No rashes, lesions or ulcers Psychiatry: Judgement and insight appear normal. Mood & affect anxious.    Data Reviewed: I have personally reviewed following labs and imaging studies  CBC: Recent Labs  Lab 03/07/2020 1529 03/16/20 0521 03/17/20 0344  WBC 7.4 7.8 10.8*  NEUTROABS 6.3  --   --   HGB 10.6* 9.9* 9.6*  HCT 35.2* 33.0* 31.6*  MCV 104.5* 103.4* 101.0*  PLT 171 127* PLATELET CLUMPS NOTED ON SMEAR, UNABLE TO ESTIMATE   Basic Metabolic Panel: Recent Labs  Lab 03/26/2020 1529 03/16/20 0521 03/17/20 0344  NA 134* 135 134*  K 4.7 4.4 4.5  CL 103 107 105  CO2 17* 15* 13*  GLUCOSE 202* 129* 201*  BUN 80* 82* 87*  CREATININE 6.27* 5.62* 5.72*  CALCIUM 8.9 8.5* 8.3*   GFR: Estimated Creatinine Clearance: 9 mL/min (A) (by C-G formula based on SCr of 5.72 mg/dL (H)). Liver Function Tests: Recent Labs  Lab 03/14/2020 1529 03/16/20 0521  AST 74* 64*  ALT 25 25  ALKPHOS 65 65  BILITOT 0.8 0.7  PROT 7.2 6.4*  ALBUMIN 3.6 3.3*   Recent Labs  Lab 03/14/2020 1529  LIPASE 30   No results for input(s): AMMONIA in the last 168 hours. Coagulation Profile: Recent Labs  Lab 03/16/20 0521  INR 1.6*   Cardiac Enzymes: No results for input(s): CKTOTAL, CKMB, CKMBINDEX, TROPONINI in the last 168 hours. BNP (last 3 results) No results for input(s): PROBNP in the last 8760 hours. HbA1C: No results for input(s): HGBA1C in the last 72 hours. CBG: Recent Labs  Lab 04/05/2020 1514  GLUCAP 170*   Lipid Profile: No results for input(s): CHOL, HDL, LDLCALC, TRIG, CHOLHDL, LDLDIRECT in the last 72 hours. Thyroid Function Tests: Recent Labs    03/16/20 1307  TSH 5.149*   Anemia Panel: No results for input(s): VITAMINB12, FOLATE, FERRITIN, TIBC, IRON, RETICCTPCT in the last 72 hours. Sepsis Labs: No results for input(s): PROCALCITON,  LATICACIDVEN in the last 168 hours.  Recent Results (from the past 240 hour(s))  Respiratory Panel by RT PCR (Flu A&B, Covid) - Nasopharyngeal Swab     Status: None   Collection Time: 03/14/2020  3:29 PM   Specimen: Nasopharyngeal Swab  Result Value Ref Range Status   SARS Coronavirus 2 by RT PCR NEGATIVE NEGATIVE Final    Comment: (NOTE) SARS-CoV-2 target nucleic acids are NOT DETECTED.  The SARS-CoV-2 RNA is generally detectable in upper respiratoy specimens during the acute phase of infection. The lowest concentration of SARS-CoV-2 viral copies this assay can detect is 131 copies/mL. A negative result does not preclude SARS-Cov-2 infection and should not be used as the sole basis for treatment or other patient management decisions. A negative  result may occur with  improper specimen collection/handling, submission of specimen other than nasopharyngeal swab, presence of viral mutation(s) within the areas targeted by this assay, and inadequate number of viral copies (<131 copies/mL). A negative result must be combined with clinical observations, patient history, and epidemiological information. The expected result is Negative.  Fact Sheet for Patients:  PinkCheek.be  Fact Sheet for Healthcare Providers:  GravelBags.it  This test is no t yet approved or cleared by the [place] FDA and  has been authorized for detection and/or diagnosis of SARS-CoV-2 by FDA under an Emergency Use Authorization (EUA). This EUA will remain  in effect (meaning this test can be used) for the duration of the COVID-19 declaration under Section 564(b)(1) of the Act, 21 U.S.C. section 360bbb-3(b)(1), unless the authorization is terminated or revoked sooner.     Influenza A by PCR NEGATIVE NEGATIVE Final   Influenza B by PCR NEGATIVE NEGATIVE Final    Comment: (NOTE) The Xpert Xpress SARS-CoV-2/FLU/RSV assay is intended as an aid in  the  diagnosis of influenza from Nasopharyngeal swab specimens and  should not be used as a sole basis for treatment. Nasal washings and  aspirates are unacceptable for Xpert Xpress SARS-CoV-2/FLU/RSV  testing.  Fact Sheet for Patients: PinkCheek.be  Fact Sheet for Healthcare Providers: GravelBags.it  This test is not yet approved or cleared by the [place] FDA and  has been authorized for detection and/or diagnosis of SARS-CoV-2 by  FDA under an Emergency Use Authorization (EUA). This EUA will remain  in effect (meaning this test can be used) for the duration of the  Covid-19 declaration under Section 564(b)(1) of the Act, 21  U.S.C. section 360bbb-3(b)(1), unless the authorization is  terminated or revoked. Performed at [facility], [place] [address], [place] [employer], [place] [address]          Radiology Studies: DG Chest 2 View  Result Date: 03/10/2020 CLINICAL DATA:  Shortness of breath EXAM: CHEST - 2 VIEW COMPARISON:  July 19, 2016 FINDINGS: The cardiomediastinal silhouette is unchanged in contour.Atherosclerotic calcifications of the aorta. There isa small RIGHT pleural effusion. No pneumothorax. No acute pleuroparenchymal abnormality. Visualized abdomen is unremarkable. Mild degenerative changes of the thoracic spine. IMPRESSION: Small RIGHT pleural effusion. Electronically Signed   By: Valentino Saxon MD   On: 03/25/2020 15:53   CT Head Wo Contrast  Result Date: 03/27/2020 CLINICAL DATA:  86 year old male with syncope. EXAM: CT HEAD WITHOUT CONTRAST TECHNIQUE: Contiguous axial images were obtained from the base of the skull through the vertex without intravenous contrast. COMPARISON:  None. FINDINGS: Brain: There is mild age-related atrophy and chronic microvascular ischemic changes. Subcentimeter right basal ganglia hypodense foci, likely old lacunar infarcts. There is no acute intracranial  hemorrhage. No mass effect or midline shift. No extra-axial fluid collection. Vascular: No hyperdense vessel or unexpected calcification. Skull: Normal. Negative for fracture or focal lesion. Sinuses/Orbits: No acute finding. Other: None IMPRESSION: 1. No acute intracranial pathology. 2. Mild age-related atrophy and chronic microvascular ischemic changes. Electronically Signed   By: Anner Crete M.D.   On: 03/24/2020 17:18   NM Pulmonary Perfusion  Result Date: 03/18/2020 CLINICAL DATA:  Shortness of breath EXAM: NUCLEAR MEDICINE PERFUSION LUNG SCAN TECHNIQUE: Perfusion images were obtained in multiple projections after intravenous injection of radiopharmaceutical. Ventilation scans intentionally deferred if perfusion scan and chest x-ray adequate for interpretation during COVID 19 epidemic. RADIOPHARMACEUTICALS:  4.2 mCi Tc-31m MAA IV COMPARISON:  March 15, 2020. FINDINGS: Evaluation limited secondary to  inability of patient to raise arms which resulted in artifact. Mildly heterogeneous perfusion bilaterally without focal wedge-shaped defect. IMPRESSION: Low probability for PE. Electronically Signed   By: Valentino Saxon MD   On: 03/13/2020 19:15   ECHOCARDIOGRAM COMPLETE  Result Date: 03/16/2020    ECHOCARDIOGRAM REPORT   Patient Name:   MAHMOOD BOEHRINGER Date of Exam: 03/16/2020 Medical Rec #:  [id number]      Height:       71.0 in Accession #:    [id number]     Weight:       140.0 lb Date of Birth:  Dec 28, 1932      BSA:          1.812 m Patient Age:    89 years       BP:           121/75 mmHg Patient Gender: M              HR:           98 bpm. Exam Location:  Inpatient Procedure: 2D Echo Indications:    elevated troponin  History:        Patient has no prior history of Echocardiogram examinations.                 Risk Factors:Diabetes, Dyslipidemia and Former Smoker.  Sonographer:    Jannett Celestine RDCS (AE) Referring Phys: [id number] [facility] M PATEL  Sonographer Comments: no true parasternal window  IMPRESSIONS  1. Severe anteroseptal and basal to mid anterior hypokinesis. . Left ventricular ejection fraction, by estimation, is 35 to 40%. The left ventricle has moderately decreased function. The left ventricle demonstrates regional wall motion abnormalities (see scoring diagram/findings for description). There is mild left ventricular hypertrophy of the basal-septal segment. Left ventricular diastolic parameters are consistent with Grade II diastolic dysfunction (pseudonormalization). Elevated left ventricular end-diastolic pressure.  2. Right ventricular systolic function is normal. The right ventricular size is normal. There is normal pulmonary artery systolic pressure.  3. Left atrial size was mildly dilated.  4. The mitral valve is normal in structure. Trivial mitral valve regurgitation. No evidence of mitral stenosis. Moderate mitral annular calcification.  5. The aortic valve was not well visualized. Aortic valve regurgitation is not visualized. Mild aortic valve stenosis. Aortic valve mean gradient measures 16.0 mmHg. Aortic valve Vmax measures 2.42 m/s.  6. The inferior vena cava is dilated in size with <50% respiratory variability, suggesting right atrial pressure of 15 mmHg. FINDINGS  Left Ventricle: Severe anteroseptal and basal to mid anterior hypokinesis. Left ventricular ejection fraction, by estimation, is 35 to 40%. The left ventricle has moderately decreased function. The left ventricle demonstrates regional wall motion abnormalities. The left ventricular internal cavity size was normal in size. There is mild left ventricular hypertrophy of the basal-septal segment. Left ventricular diastolic parameters are consistent with Grade II diastolic dysfunction (pseudonormalization). Elevated left ventricular end-diastolic pressure. Right Ventricle: The right ventricular size is normal. No increase in right ventricular wall thickness. Right ventricular systolic function is normal. There is normal  pulmonary artery systolic pressure. The tricuspid regurgitant velocity is 2.06 m/s, and  with an assumed right atrial pressure of 15 mmHg, the estimated right ventricular systolic pressure is 53.6 mmHg. Left Atrium: Left atrial size was mildly dilated. Right Atrium: Right atrial size was normal in size. Pericardium: There is no evidence of pericardial effusion. Mitral Valve: The mitral valve is normal in structure. Moderate mitral annular calcification. Trivial mitral valve regurgitation. No evidence  of mitral valve stenosis. Tricuspid Valve: The tricuspid valve is normal in structure. Tricuspid valve regurgitation is mild . No evidence of tricuspid stenosis. Aortic Valve: The aortic valve was not well visualized. Aortic valve regurgitation is not visualized. Mild aortic stenosis is present. Aortic valve mean gradient measures 16.0 mmHg. Aortic valve peak gradient measures 23.4 mmHg. Aortic valve area, by VTI  measures 0.76 cm. Pulmonic Valve: The pulmonic valve was normal in structure. Pulmonic valve regurgitation is not visualized. No evidence of pulmonic stenosis. Aorta: The aortic root is normal in size and structure. Venous: The inferior vena cava is dilated in size with less than 50% respiratory variability, suggesting right atrial pressure of 15 mmHg. IAS/Shunts: No atrial level shunt detected by color flow Doppler.  LEFT VENTRICLE PLAX 2D LVIDd:         4.60 cm  Diastology LVIDs:         3.80 cm  LV e' medial:    6.20 cm/s LV PW:         1.24 cm  LV E/e' medial:  16.9 LV IVS:        1.00 cm  LV e' lateral:   6.64 cm/s LVOT diam:     2.10 cm  LV E/e' lateral: 15.8 LV SV:         39 LV SV Index:   21 LVOT Area:     3.46 cm  LEFT ATRIUM           Index LA diam:      3.90 cm 2.15 cm/m LA Vol (A4C): 53.2 ml 29.36 ml/m  AORTIC VALVE AV Area (Vmax):    0.75 cm AV Area (Vmean):   0.74 cm AV Area (VTI):     0.76 cm AV Vmax:           242.00 cm/s AV Vmean:          193.000 cm/s AV VTI:            0.512 m AV  Peak Grad:      23.4 mmHg AV Mean Grad:      16.0 mmHg LVOT Vmax:         52.20 cm/s LVOT Vmean:        41.400 cm/s LVOT VTI:          0.112 m LVOT/AV VTI ratio: 0.22  AORTA Ao Root diam: 3.00 cm MITRAL VALVE                TRICUSPID VALVE MV Area (PHT): 4.39 cm     TR Peak grad:   17.0 mmHg MV Decel Time: 173 msec     TR Vmax:        206.00 cm/s MV E velocity: 105.00 cm/s                             SHUNTS                             Systemic VTI:  0.11 m                             Systemic Diam: 2.10 cm Skeet Latch MD Electronically signed by Skeet Latch MD Signature Date/Time: 03/16/2020/12:36:03 PM    Final         Scheduled Meds: . aspirin EC  81 mg Oral Daily  .  Chlorhexidine Gluconate Cloth  6 each Topical Daily  . feeding supplement (NEPRO CARB STEADY)  237 mL Oral BID BM  . pantoprazole  40 mg Oral Daily  . rosuvastatin  20 mg Oral Daily   Continuous Infusions: . heparin 1,150 Units/hr (03/16/20 1254)     LOS: 2 days    Time spent: 35 minutes    Barb Merino, MD [facility] Pager [phone]

## 2020-03-17 NOTE — Progress Notes (Signed)
OT Cancellation Note  Patient Details Name: Richard Moses MRN: [id number] DOB: Oct 29, 1932   Cancelled Treatment:    Reason Eval/Treat Not Completed: (P) Patient at procedure or test/ unavailable (Having swallowing test. Will follow up later time)  [facility] 03/17/2020, 8:22 AM  Maurie Boettcher, OT/L   Acute OT Clinical Specialist [place] Pager [phone] Office [phone]

## 2020-03-17 NOTE — Progress Notes (Signed)
   03/16/20 2342  Assess: MEWS Score  Pulse Rate (!) 111  ECG Heart Rate (!) 111  Resp 20  SpO2 92 %  O2 Device Nasal Cannula  O2 Flow Rate (L/min) 2 L/min  Assess: MEWS Score  MEWS Temp 0  MEWS Systolic 0  MEWS Pulse 2  MEWS RR 0  MEWS LOC 0  MEWS Score 2  MEWS Score Color Yellow  Assess: if the MEWS score is Yellow or Red  Were vital signs taken at a resting state? Yes  Focused Assessment Change from prior assessment (see assessment flowsheet) (tachypneic, tachycardic, and c/o nausea)  Early Detection of Sepsis Score *See Row Information* Medium  MEWS guidelines implemented *See Row Information* No, previously yellow, continue vital signs every 4 hours  Treat  MEWS Interventions Escalated (See documentation below)  Pain Scale 0-10  Pain Score 0  Patients Stated Pain Goal 0  Take Vital Signs  Increase Vital Sign Frequency  Yellow: Q 2hr X 2 then Q 4hr X 2, if remains yellow, continue Q 4hrs (Continue VS Q 4 hrs)  Escalate  MEWS: Escalate Yellow: discuss with charge nurse/RN and consider discussing with provider and RRT  Notify: Charge Nurse/RN  Name of Charge Nurse/RN Notified Yetta Glassman, RN  Date Charge Nurse/RN Notified 03/16/20  Time Charge Nurse/RN Notified 2348  Notify: Provider  Provider Name/Title Dr. Marcelle Smiling  Date Provider Notified 03/16/20  Time Provider Notified 2340  Notification Type Page  Notification Reason Other (Comment) (EKG changes: ST depression in leads V3-V5)  Response No new orders  Date of Provider Response 03/16/20  Time of Provider Response 2345  Document  Patient Outcome Stabilized after interventions  Progress note created (see row info) Yes

## 2020-03-17 NOTE — TOC Initial Note (Signed)
Transition of Care [facility]) - Initial/Assessment Note    Patient Details  Name: Richard Moses MRN: [id number] Date of Birth: 11/15/1932  [facility] CM/SW Contact:    Trula Ore, [place] Phone Number: 03/17/2020, 4:39 PM  Clinical Narrative:                  CSW received consult for possible SNF placement at time of discharge. CSW spoke with patient regarding PT recommendation of SNF placement at time of discharge. Patient comes from home. Patients son [name] lives with patient. Patient expressed understanding of PT recommendation and  declined SNF placement at time of discharge. Patient wants to go home with home health services.Patient has 24/7 supervision from son [name]. CSW let patient know that Case manager will follow up with him for home health services.Patient has received the COVID vaccines.No further questions reported at this time. CSW to continue to follow and assist with discharge planning needs.  Expected Discharge Plan: [place] Barriers to Discharge: Continued Medical Work up   Patient Goals and CMS Choice Patient states their goals for this hospitalization and ongoing recovery are:: SNF CMS Medicare.gov Compare Post Acute Care list provided to:: Patient Choice offered to / list presented to : Patient  Expected Discharge Plan and Services Expected Discharge Plan: [place]       Living arrangements for the past 2 months: Single Family Home                                      Prior Living Arrangements/Services Living arrangements for the past 2 months: Single Family Home Lives with:: Self, Adult Children Patient language and need for interpreter reviewed:: Yes Do you feel safe going back to the place where you live?: Yes      Need for Family Participation in Patient Care: Yes (Comment) Care giver support system in place?: Yes (comment)   Criminal Activity/Legal Involvement Pertinent to Current  Situation/Hospitalization: No - Comment as needed  Activities of Daily Living Home Assistive Devices/Equipment: Cane (specify quad or straight) ADL Screening (condition at time of admission) Patient's cognitive ability adequate to safely complete daily activities?: Yes Is the patient deaf or have difficulty hearing?: Yes Does the patient have difficulty seeing, even when wearing glasses/contacts?: No Does the patient have difficulty concentrating, remembering, or making decisions?: No Patient able to express need for assistance with ADLs?: Yes Does the patient have difficulty dressing or bathing?: Yes Independently performs ADLs?: No Does the patient have difficulty walking or climbing stairs?: Yes Weakness of Legs: Both Weakness of Arms/Hands: None  Permission Sought/Granted Permission sought to share information with : Case Manager, Family Supports, Customer service manager    Share Information with NAME: [name]  Permission granted to share info w AGENCY: HH  Permission granted to share info w Relationship: Son  Permission granted to share info w Contact Information: [name] [phone]  Emotional Assessment Appearance:: Appears stated age Attitude/Demeanor/Rapport: Gracious Affect (typically observed): Calm Orientation: : Oriented to Self, Oriented to Place, Oriented to Situation Alcohol / Substance Use: Not Applicable Psych Involvement: No (comment)  Admission diagnosis:  Syncope and collapse [R55] Shortness of breath [R06.02] Elevated troponin [R77.8] NSTEMI (non-ST elevated myocardial infarction) ([place]) [I21.4] Chest pain, unspecified type [R07.9] Patient Active Problem List   Diagnosis Date Noted  . Cardiogenic shock ([place])   . NSTEMI (non-ST elevated myocardial  infarction) ([place])   . Acute combined systolic and diastolic (congestive) hrt fail ([place])   . Elevated troponin 03/18/2020  . Edema 03/10/2020  . Right-sided Bell's palsy 03/19/2020  . Syncope and  collapse 03/30/2020  . Chest pain 03/23/2020  . Pleural effusion on right 03/24/2020  . Right AVF (arteriovenous fistula) ([place]) 03/10/2020  . Bilateral shoulder pain 09/08/2019  . Peripheral artery disease ([place]) 09/08/2019  . Chronic indwelling Foley catheter 08/17/2019  . Diarrhea 08/17/2019  . Acquired nasal deformity 10/16/2018  . Secondary hyperparathyroidism ([place]) 03/03/2018  . Memory disturbance 03/03/2017  . Bilateral knee pain 03/03/2017  . Bilateral cataracts 01/17/2017  . Peripheral neuropathy 11/30/2016  . Anemia associated with stage 5 chronic renal failure ([place]) 07/14/2016  . Encounter for wellness examination in adult 04/23/2016  . Chronic kidney disease, stage V ([place]) 04/17/2014  . Primary hypertension 12/25/2009   PCP:  Carollee Leitz, MD Pharmacy:   [id number] - [place] [place] [address] Phone: [phone] Fax: [phone]     Social Determinants of Health (SDOH) Interventions    Readmission Risk Interventions No flowsheet data found.

## 2020-03-17 NOTE — Consult Note (Signed)
Consultation Note Date: 03/17/2020   Patient Name: Richard Moses  DOB: 06/11/1932  MRN: [id number]  Age / Sex: 86 y.o., male  PCP: Carollee Leitz, MD Referring Physician: Barb Merino, MD  Reason for Consultation: Establishing goals of care  HPI/Patient Profile: 86 y.o. male   admitted on 03/09/2020 with past medical history of CKD stage V, BPH, melanoma, diet-controlled type II DM.   Presents with complaints of syncope and right-sided pleuritic chest pain.   Admitted for workup and stabilization.     NSTEMI, CHF, worsening renal function  Reports unintentional 10 pound weight loss in last 6 months   Patient lives at home with his "adopted" son Richard Moses  In September was hospitalized for AV fistula placement as a day procedure only.  No other hospitalization procedures or immobilization.   Patient faces treatment option decisions, advanced directive decisions and anticipatory care needs.   Clinical Assessment and Goals of Care:   This NP Wadie Lessen reviewed medical records, received report from team, assessed the patient and then meet at the patient's bedside along with his son Richard Moses to discuss diagnosis, prognosis, GOC, EOL wishes disposition and options.   Concept of Palliative Care was introduced as specialized medical care for people and their families living with serious illness.  If focuses on providing relief from the symptoms and stress of a serious illness.  The goal is to improve quality of life for both the patient and the family.  Created space and opportunity for patient and his family to explore the thoughts and feelings regarding current medical situation.  Patient verbalizes his ambiguity with decision regarding dialysis his son however is very adamant and "I want him to stay alive I want him to do what ever it takes"  Education offered regarding concept specific to human mortality  and the limitations of medical interventions to prolong quality of life when the body begins to fail to thrive.  Patient was clearly able to verbalize the importance of quality of life to him "I do not want to just lay here"     A  discussion was had today regarding advanced directives.  Concepts specific to code status, artifical feeding and hydration, continued IV antibiotics and rehospitalization was had.  The difference between a aggressive medical intervention path  and a palliative comfort care path for this patient at this time was had.  Values and goals of care important to patient and family were attempted to be elicited.   MOST form intoduced    Patient and his son report that there is documentation of H POA and advanced directive.  Richard Moses will look for them tonight and bring them in for scanning.    Questions and concerns addressed.  Patient  encouraged to call with questions or concerns.     PMT will continue to support holistically.  I will f/u tomorrow for ongoing support regarding healthcare decisions.            Per patient HPOA is his son Richard Moses  SUMMARY OF RECOMMENDATIONS      Code Status/Advance Care Planning:  Full code  Encouraged patient/family to consider DNR/DNI status understanding evidenced based poor outcomes in similar hospitalized patient, as the cause of arrest is likely associated with advanced chronic illness rather than an easily reversible acute cardio-pulmonary event.   Palliative Prophylaxis:   Aspiration, Bowel Regimen, Delirium Protocol, Frequent Pain Assessment and Oral Care  Additional Recommendations (Limitations, Scope, Preferences):  Full Scope Treatment  Psycho-social/Spiritual:   Desire for further Chaplaincy support:yes  Additional Recommendations: Education on Hospice  Prognosis:   Unable to determine-will depend on decision for life prolonging measures  Discharge Planning: To Be Determined       Primary Diagnoses: Present on Admission: . Elevated troponin . Primary hypertension . Chronic kidney disease, stage V ([place]) . Anemia associated with stage 5 chronic renal failure ([place]) . Secondary hyperparathyroidism ([place]) . Peripheral artery disease ([place]) . Edema . Syncope and collapse . Diarrhea . Pleural effusion on right . Right AVF (arteriovenous fistula) ([place]) . NSTEMI (non-ST elevated myocardial infarction) ([place])   I have reviewed the medical record, interviewed the patient and family, and examined the patient. The following aspects are pertinent.  Past Medical History:  Diagnosis Date  . Abnormal MRI, cervical spine 09/29/10   Mildly abnormal MRI cervical spine demonstrating mild spondylosis and disc bulging from C4-5 down to C7-T1.  No spinal stenosis or foraminal narrowing  . Anemia   . Arthritis   . Blood transfusion without reported diagnosis   . BPH (benign prostatic hyperplasia)   . Cancer [facility])    possible melanoma on lt.leg  . Cataract    beginning stage  . Cervical disc herniation 09/29/10   C4-C5 and C7-T1  . Chronic kidney disease (CKD)    stage 4 per dr patel nephrology lov 10-11-2019  . Chronic left-sided headaches   . Colon polyp 2007  . Diabetes mellitus    diet dontrolled  . Elevated PSA   . External hemorrhoids without mention of complication 4128  . GERD (gastroesophageal reflux disease)   . History of hypertension    no current meds for  . Hydronephrosis    acute renal failure superimposed on stage 4 ckd  . Hyperlipidemia   . Hypothyroidism 09/29/10   Untreated. Patient not interested in Rx.   . Intermittent self-catheterization of bladder    placed every month, foley in all the time  . Knee pain, bilateral 02/05/2013   Pain since a fall in 2014  . MRI of brain abnormal 09/29/10   Abnormal MRI of brain, demonstrating mild atrophy  . Neck pain on left side 2012  . Neuropathy   . Plantar fasciitis    left foot  . Secondary  hyperparathyroidism ([place])    Social History   Socioeconomic History  . Marital status: Single    Spouse name: Not on file  . Number of children: 0  . Years of education: Not on file  . Highest education level: Not on file  Occupational History  . Occupation: Education officer, environmental: OTHER  Tobacco Use  . Smoking status: Former Smoker    Types: Cigarettes    Quit date: 12/06/1966    Years since quitting: 53.3  . Smokeless tobacco: Never Used  Vaping Use  . Vaping Use: Never used  Substance and Sexual Activity  . Alcohol use: Not Currently    Alcohol/week: 0.0 standard drinks  . Drug use: No  . Sexual activity: Never  Other  Topics Concern  . Not on file  Social History Narrative   Lives with boarder (young [place] man) - he helps with computer work managing his property in [place]. 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      Family lives in [place] where he is from originally       Updated 12/11/2015   Social Determinants of Health   Financial Resource Strain:   . Difficulty of Paying Living Expenses: Not on file  Food Insecurity:   . Worried About Charity fundraiser in the Last Year: Not on file  . Ran Out of Food in the Last Year: Not on file  Transportation Needs:   . Lack of Transportation (Medical): Not on file  . Lack of Transportation (Non-Medical): Not on file  Physical Activity:   . Days of Exercise per Week: Not on file  . Minutes of Exercise per Session: Not on file  Stress:   . Feeling of Stress : Not on file  Social Connections:   . Frequency of Communication with Friends and Family: Not on file  . Frequency of Social Gatherings with Friends and Family: Not on file  . Attends Religious Services: Not on file  . Active Member of Clubs or Organizations: Not on file  . Attends Archivist Meetings: Not on file  . Marital Status: Not on file   Family History  Problem Relation Age of Onset  . Cancer Father 33       Renal CA  .  Cancer Sister        Brain   . Cancer Brother        Brain  . Colon cancer Neg Hx   . Stomach cancer Neg Hx   . Colon polyps Neg Hx   . Esophageal cancer Neg Hx   . Rectal cancer Neg Hx    Scheduled Meds: . aspirin EC  81 mg Oral Daily  . Chlorhexidine Gluconate Cloth  6 each Topical Daily  . feeding supplement (NEPRO CARB STEADY)  237 mL Oral BID BM  . mouth rinse  15 mL Mouth Rinse BID  . midodrine  5 mg Oral TID WC  . pantoprazole  40 mg Oral Daily  . rosuvastatin  20 mg Oral Daily  . sodium chloride flush  3 mL Intravenous Q12H   Continuous Infusions: . furosemide    . heparin 1,150 Units/hr (03/17/20 1144)   PRN Meds:.acetaminophen **OR** acetaminophen, morphine injection, ondansetron **OR** ondansetron (ZOFRAN) IV, traMADol Medications Prior to Admission:  Prior to Admission medications   Medication Sig Start Date End Date Taking? Authorizing Provider  acetaminophen (TYLENOL) 650 MG CR tablet Take 650 mg by mouth every 8 (eight) hours as needed for pain or fever.   Yes [provider]  Acetylcysteine (N-ACETYL-L-CYSTEINE) 600 MG CAPS Take 1 capsule by mouth daily.   Yes [provider]  Ascorbic Acid (VITAMIN C) 1000 MG tablet Take 1,000 mg by mouth daily.   Yes [provider]  B Complex-C-Folic Acid (B-COMPLEX-C, W/FOLIC ACID, PO) Take 1 tablet by mouth daily.   Yes [provider]  Barberry-Oreg Grape-Goldenseal (BERBERINE COMPLEX PO) Take 1 tablet by mouth 3 (three) times daily before meals.   Yes [provider]  Cholecalciferol (D3 HIGH POTENCY) 50 MCG (2000 UT) CAPS Take 2,000 Units by mouth daily.   Yes [provider]  Chromium Picolinate 800 MCG TABS Take 800 mcg by mouth daily.   Yes [provider]  CINNAMON PO Take 4,000 mg by mouth daily. 2 capsules   Yes [provider]  Cranberry-Vitamin C-Vitamin E (CRANBERRY PLUS VITAMIN C) 4200-20-3 MG-MG-UNIT CAPS Take 2 capsules by mouth 3 (three)  times daily.   Yes [provider]  ferrous sulfate 325 (65 FE) MG tablet Take 325 mg by mouth daily with breakfast.   Yes [provider]  Ginkgo Biloba 120 MG CAPS Take 120 mg by mouth daily.   Yes [provider]  magnesium gluconate (MAGONATE) 500 MG tablet Take 500 mg by mouth daily.   Yes [provider]  OVER THE COUNTER MEDICATION Take 1 capsule by mouth in the morning and at bedtime. Circulation calcium disodium edta   Yes [provider]  OVER THE COUNTER MEDICATION Take 4-6 capsules by mouth with breakfast, with lunch, and with evening meal. Colon cleanser   Yes [provider]  OVER THE COUNTER MEDICATION Take 1 capsule by mouth in the morning and at bedtime. Stemcell maxum   Yes [provider]  Selenium 200 MCG CAPS Take 200 mcg by mouth daily.   Yes [provider]  Turmeric, Lear Ng, (CURCUMIN) POWD Take 1 capsule by mouth daily.   Yes [provider]  sulfamethoxazole-trimethoprim (BACTRIM DS) 800-160 MG tablet Take 1 tablet by mouth 2 (two) times daily. 03/04/20   [provider]  traMADol (ULTRAM) 50 MG tablet Take 1 tablet (50 mg total) by mouth every 6 (six) hours as needed. Patient not taking: Reported on 03/16/2020 02/25/20   Ulyses Amor, PA-C   No Known Allergies Review of Systems  Neurological: Positive for weakness.    Physical Exam Constitutional:      Appearance: He is underweight. He is ill-appearing.  Cardiovascular:     Rate and Rhythm: Tachycardia present.  Musculoskeletal:     Comments: generalized weakness  Skin:    General: Skin is warm and dry.  Neurological:     Mental Status: He is alert and oriented to person, place, and time.     Vital Signs: BP 98/69 (BP Location: Right Arm)   Pulse (!) 109   Temp 97.6 F (36.4 C) (Oral)   Resp 18   Ht 5\' 11"  (1.803 m)   Wt 67.1 kg   SpO2 93%   BMI 20.63 kg/m  Pain Scale: Faces   Pain Score:  Asleep   SpO2: SpO2: 93 % O2 Device:SpO2: 93 % O2 Flow Rate: .O2 Flow Rate (L/min): 2 L/min  IO: Intake/output summary:   Intake/Output Summary (Last 24 hours) at 03/17/2020 1550 Last data filed at 03/17/2020 1516 Gross per 24 hour  Intake 521.8 ml  Output 600 ml  Net -78.2 ml    LBM:   Baseline Weight: Weight: 63.5 kg Most recent weight: Weight: 67.1 kg     Palliative Assessment/Data:  PPS: 40% at best   Discussed with Dr Raelyn Mora  Time In: 1600 Time Out: 1710 Time Total: 70 minutes Greater than 50%  of this time was spent counseling and coordinating care related to the above assessment and plan.  Signed by: Wadie Lessen, NP   Please contact Palliative Medicine Team phone at [phone] for questions and concerns.  For individual provider: See Shea Evans

## 2020-03-17 NOTE — Progress Notes (Signed)
ANTICOAGULATION CONSULT NOTE   Pharmacy Consult for IV heparin Indication: NSTEMI  No Known Allergies  Patient Measurements: Height: 5\' 11"  (180.3 cm) Weight: 67.1 kg (147 lb 14.9 oz) IBW/kg (Calculated) : 75.3 Heparin Dosing Weight: 63.5 kg  Vital Signs: Temp: 98.1 F (36.7 C) (10/11 0743) Temp Source: Oral (10/11 0743) BP: 104/75 (10/11 0743) Pulse Rate: 124 (10/11 0743)  Labs: Recent Labs    03/28/2020 1529 03/11/2020 1529 03/12/2020 1729 03/16/20 0130 03/16/20 0521 03/16/20 0919 03/17/20 0344  HGB 10.6*   < >  --   --  9.9*  --  9.6*  HCT 35.2*  --   --   --  33.0*  --  31.6*  PLT 171  --   --   --  127*  --  PLATELET CLUMPS NOTED ON SMEAR, UNABLE TO ESTIMATE  LABPROT  --   --   --   --  18.7*  --   --   INR  --   --   --   --  1.6*  --   --   HEPARINUNFRC  --   --   --  0.64  --  0.63 0.40  CREATININE 6.27*  --   --   --  5.62*  --  5.72*  TROPONINIHS 7,391*  --  7,160*  --   --   --   --    < > = values in this interval not displayed.    Estimated Creatinine Clearance: 9 mL/min (A) (by C-G formula based on SCr of 5.72 mg/dL (H)).   Medical History: Past Medical History:  Diagnosis Date  . Abnormal MRI, cervical spine 09/29/10   Mildly abnormal MRI cervical spine demonstrating mild spondylosis and disc bulging from C4-5 down to C7-T1.  No spinal stenosis or foraminal narrowing  . Anemia   . Arthritis   . Blood transfusion without reported diagnosis   . BPH (benign prostatic hyperplasia)   . Cancer [facility])    possible melanoma on lt.leg  . Cataract    beginning stage  . Cervical disc herniation 09/29/10   C4-C5 and C7-T1  . Chronic kidney disease (CKD)    stage 4 per dr patel nephrology lov 10-11-2019  . Chronic left-sided headaches   . Colon polyp 2007  . Diabetes mellitus    diet dontrolled  . Elevated PSA   . External hemorrhoids without mention of complication 5170  . GERD (gastroesophageal reflux disease)   . History of hypertension    no current meds  for  . Hydronephrosis    acute renal failure superimposed on stage 4 ckd  . Hyperlipidemia   . Hypothyroidism 09/29/10   Untreated. Patient not interested in Rx.   . Intermittent self-catheterization of bladder    placed every month, foley in all the time  . Knee pain, bilateral 02/05/2013   Pain since a fall in 2014  . MRI of brain abnormal 09/29/10   Abnormal MRI of brain, demonstrating mild atrophy  . Neck pain on left side 2012  . Neuropathy   . Plantar fasciitis    left foot  . Secondary hyperparathyroidism (HCC)     Medications:  Scheduled:  . aspirin EC  81 mg Oral Daily  . Chlorhexidine Gluconate Cloth  6 each Topical Daily  . feeding supplement (NEPRO CARB STEADY)  237 mL Oral BID BM  . pantoprazole  40 mg Oral Daily  . rosuvastatin  20 mg Oral Daily   Infusions:  . heparin  1,150 Units/hr (03/16/20 1254)    Assessment: 62 yoM admitted with NSTEMI on IV heparin. No AC PTA. VQ scan low prob PE. Heparin level therapeutic, H/H stable, plt clumping.  Goal of Therapy:  Heparin level 0.3-0.7 units/ml Monitor platelets by anticoagulation protocol: Yes   Plan:  -Continue heparin 1150 units/h -Daily heparin level and CBC   Arrie Senate, PharmD, BCPS Clinical Pharmacist [phone] Please check AMION for all [facility] Pharmacy numbers 03/17/2020

## 2020-03-17 NOTE — TOC Initial Note (Signed)
Transition of Care [facility]) - Initial/Assessment Note    Patient Details  Name: Richard Moses MRN: [id number] Date of Birth: Apr 18, 1933  Transition of Care [facility]) CM/SW Contact:    Ninfa Meeker, RN Phone Number: 03/17/2020, 6:07 PM  Clinical Narrative:   86 yr old male admitted with NSTEMI. Needs Cath but BP has been trending low. Started on Midodrine. Case manager spoke with son via telephone concerning discharge plan. Patient refusing SNF, when medically ready wants to go home with Home Health. Patient's son, Richard Moses, says he lives with his dad and can provide assistance. Choice offered for Home Health, referral called to Adela Lank, [facility] Liaison. TOC will follow for further needs.   Expected Discharge Plan: [place] Barriers to Discharge: Continued Medical Work up   Patient Goals and CMS Choice Patient states their goals for this hospitalization and ongoing recovery are:: wants to go home CMS Medicare.gov Compare Post Acute Care list provided to:: Patient Choice offered to / list presented to : Patient  Expected Discharge Plan and Services Expected Discharge Plan: [place]   Discharge Planning Services: CM Consult Post Acute Care Choice: [place] arrangements for the past 2 months: [place] Arranged: RN, PT, OT [facility] Agency: [place] Date [facility] Agency Contacted: 03/17/20 Time HH Agency Contacted: 1806 Representative spoke with at [place]: Darlyn Read  Prior Living Arrangements/Services Living arrangements for the past 2 months: [place] with:: Self, Adult Children Patient language and need for interpreter reviewed:: Yes Do you feel safe going back to the place where you live?: Yes      Need for Family Participation in Patient Care: Yes (Comment) Care giver support system in place?: Yes (comment)   Criminal Activity/Legal Involvement  Pertinent to Current Situation/Hospitalization: No - Comment as needed  Activities of Daily Living Home Assistive Devices/Equipment: Cane (specify quad or straight) ADL Screening (condition at time of admission) Patient's cognitive ability adequate to safely complete daily activities?: Yes Is the patient deaf or have difficulty hearing?: Yes Does the patient have difficulty seeing, even when wearing glasses/contacts?: No Does the patient have difficulty concentrating, remembering, or making decisions?: No Patient able to express need for assistance with ADLs?: Yes Does the patient have difficulty dressing or bathing?: Yes Independently performs ADLs?: No Does the patient have difficulty walking or climbing stairs?: Yes Weakness of Legs: Both Weakness of Arms/Hands: None  Permission Sought/Granted Permission sought to share information with : Case Manager, Family Supports, Customer service manager    Share Information with NAME: Richard Moses  Permission granted to share info w AGENCY: HH  Permission granted to share info w Relationship: Son  Permission granted to share info w Contact Information: Richard Moses [phone]  Emotional Assessment Appearance:: Appears stated age Attitude/Demeanor/Rapport: Gracious Affect (typically observed): Calm Orientation: : Oriented to Self, Oriented to Place, Oriented to Situation Alcohol / Substance Use: Not Applicable Psych Involvement: No (comment)  Admission diagnosis:  Syncope and collapse [R55] Shortness of breath [R06.02] Elevated troponin [R77.8] NSTEMI (non-ST elevated myocardial infarction) ([place]) [I21.4] Chest pain, unspecified type [R07.9] Patient Active Problem List   Diagnosis Date Noted  . Cardiogenic shock ([place])   . NSTEMI (non-ST elevated myocardial infarction) ([place])   . 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  Elevated troponin 03/22/2020  . Edema 03/13/2020  . Right-sided Bell's palsy 03/08/2020   . Syncope and collapse 03/17/2020  . Chest pain 03/17/2020  . Pleural effusion on right 03/26/2020  . Right AVF (arteriovenous fistula) ([place]) 03/26/2020  . Bilateral shoulder pain 09/08/2019  . Peripheral artery disease ([place]) 09/08/2019  . Chronic indwelling Foley catheter 08/17/2019  . Diarrhea 08/17/2019  . Acquired nasal deformity 10/16/2018  . Secondary hyperparathyroidism ([place]) 03/03/2018  . Memory disturbance 03/03/2017  . Bilateral knee pain 03/03/2017  . Bilateral cataracts 01/17/2017  . Peripheral neuropathy 11/30/2016  . Anemia associated with stage 5 chronic renal failure ([place]) 07/14/2016  . Encounter for wellness examination in adult 04/23/2016  . Chronic kidney disease, stage V ([place]) 04/17/2014  . Primary hypertension 12/25/2009   PCP:  Carollee Leitz, MD Pharmacy:   [id number] [place], [address] [place] [address] Phone: [phone] Fax: [phone]     Social Determinants of Health (SDOH) Interventions    Readmission Risk Interventions No flowsheet data found.

## 2020-03-17 NOTE — Progress Notes (Signed)
Progress Note  Patient Name: Richard Moses Date of Encounter: 03/17/2020  Primary Cardiologist: No primary care provider on file.   Subjective   Reports intermittent chest pain this morning. Predominately pleuritic in nature. Worse with laying down. Has associated SOB.   Inpatient Medications    Scheduled Meds: . aspirin EC  81 mg Oral Daily  . Chlorhexidine Gluconate Cloth  6 each Topical Daily  . feeding supplement (NEPRO CARB STEADY)  237 mL Oral BID BM  . pantoprazole  40 mg Oral Daily  . rosuvastatin  20 mg Oral Daily   Continuous Infusions: . heparin 1,150 Units/hr (03/16/20 1254)   PRN Meds: acetaminophen **OR** acetaminophen, ondansetron **OR** ondansetron (ZOFRAN) IV, traMADol   Vital Signs    Vitals:   03/17/20 0437 03/17/20 0538 03/17/20 0743 03/17/20 0947  BP: 104/76  104/75 104/72  Pulse: (!) 116 (!) 119 (!) 124 (!) 116  Resp: (!) 26 (!) 22 20 19   Temp: 97.8 F (36.6 C)  98.1 F (36.7 C) 97.8 F (36.6 C)  TempSrc: Oral  Oral   SpO2: 95% 95% 94% 96%  Weight: 67.1 kg     Height:        Intake/Output Summary (Last 24 hours) at 03/17/2020 1020 Last data filed at 03/17/2020 0600 Gross per 24 hour  Intake 419.23 ml  Output 400 ml  Net 19.23 ml   Filed Weights   03/26/2020 1510 03/16/20 1553 03/17/20 0437  Weight: 63.5 kg 95.7 kg 67.1 kg    Telemetry    Sinus tachycardia with rates up to 120s at times - Personally Reviewed  ECG    Sinus tachycardia with rate 118 bpm, non-specific ST-T wave abnormalities, incomplete RBBB, no significant change from previous - Personally Reviewed  Physical Exam   GEN: Thin elderly gentleman sitting upright in bed with mild tachypnea.   Neck: No JVD, no carotid bruits Cardiac:  tachycardic, regular rhythm, no murmurs, rubs, or gallops.  Respiratory: Clear to auscultation bilaterally, no wheezes/ rales/ rhonchi GI: NABS, Soft, nontender, non-distended  MS: 2+ ankle edema; No deformity. Neuro:  Nonfocal,  moving all extremities spontaneously Psych: Normal affect   Labs    Chemistry Recent Labs  Lab 03/14/2020 1529 03/16/20 0521 03/17/20 0344  NA 134* 135 134*  K 4.7 4.4 4.5  CL 103 107 105  CO2 17* 15* 13*  GLUCOSE 202* 129* 201*  BUN 80* 82* 87*  CREATININE 6.27* 5.62* 5.72*  CALCIUM 8.9 8.5* 8.3*  PROT 7.2 6.4*  --   ALBUMIN 3.6 3.3*  --   AST 74* 64*  --   ALT 25 25  --   ALKPHOS 65 65  --   BILITOT 0.8 0.7  --   GFRNONAA 7* 8* 8*  ANIONGAP 14 13 16*     Hematology Recent Labs  Lab 03/30/2020 1529 03/16/20 0521 03/17/20 0344  WBC 7.4 7.8 10.8*  RBC 3.37* 3.19* 3.13*  HGB 10.6* 9.9* 9.6*  HCT 35.2* 33.0* 31.6*  MCV 104.5* 103.4* 101.0*  MCH 31.5 31.0 30.7  MCHC 30.1 30.0 30.4  RDW 15.9* 15.9* 15.5  PLT 171 127* PLATELET CLUMPS NOTED ON SMEAR, UNABLE TO ESTIMATE    Cardiac EnzymesNo results for input(s): TROPONINI in the last 168 hours. No results for input(s): TROPIPOC in the last 168 hours.   BNP Recent Labs  Lab 03/31/2020 1530  BNP 4,445.6*     DDimer No results for input(s): DDIMER in the last 168 hours.   Radiology  DG Chest 2 View  Result Date: 03/12/2020 CLINICAL DATA:  Shortness of breath EXAM: CHEST - 2 VIEW COMPARISON:  July 19, 2016 FINDINGS: The cardiomediastinal silhouette is unchanged in contour.Atherosclerotic calcifications of the aorta. There isa small RIGHT pleural effusion. No pneumothorax. No acute pleuroparenchymal abnormality. Visualized abdomen is unremarkable. Mild degenerative changes of the thoracic spine. IMPRESSION: Small RIGHT pleural effusion. Electronically Signed   By: Valentino Saxon MD   On: 03/26/2020 15:53   CT Head Wo Contrast  Result Date: 04/04/2020 CLINICAL DATA:  86 year old male with syncope. EXAM: CT HEAD WITHOUT CONTRAST TECHNIQUE: Contiguous axial images were obtained from the base of the skull through the vertex without intravenous contrast. COMPARISON:  None. FINDINGS: Brain: There is mild age-related  atrophy and chronic microvascular ischemic changes. Subcentimeter right basal ganglia hypodense foci, likely old lacunar infarcts. There is no acute intracranial hemorrhage. No mass effect or midline shift. No extra-axial fluid collection. Vascular: No hyperdense vessel or unexpected calcification. Skull: Normal. Negative for fracture or focal lesion. Sinuses/Orbits: No acute finding. Other: None IMPRESSION: 1. No acute intracranial pathology. 2. Mild age-related atrophy and chronic microvascular ischemic changes. Electronically Signed   By: Anner Crete M.D.   On: 03/19/2020 17:18   NM Pulmonary Perfusion  Result Date: 03/10/2020 CLINICAL DATA:  Shortness of breath EXAM: NUCLEAR MEDICINE PERFUSION LUNG SCAN TECHNIQUE: Perfusion images were obtained in multiple projections after intravenous injection of radiopharmaceutical. Ventilation scans intentionally deferred if perfusion scan and chest x-ray adequate for interpretation during COVID 19 epidemic. RADIOPHARMACEUTICALS:  4.2 mCi Tc-67m MAA IV COMPARISON:  March 15, 2020. FINDINGS: Evaluation limited secondary to inability of patient to raise arms which resulted in artifact. Mildly heterogeneous perfusion bilaterally without focal wedge-shaped defect. IMPRESSION: Low probability for PE. Electronically Signed   By: Valentino Saxon MD   On: 03/28/2020 19:15   DG Swallowing Func-Speech Pathology  Result Date: 03/17/2020 Objective Swallowing Evaluation: Type of Study: MBS-Modified Barium Swallow Study  Patient Details Name: Richard Moses MRN: [id number] Date of Birth: 03-Feb-1933 Today's Date: 03/17/2020 Time: SLP Start Time (ACUTE ONLY): 0831 -SLP Stop Time (ACUTE ONLY): 0856 SLP Time Calculation (min) (ACUTE ONLY): 25 min Past Medical History: Past Medical History: Diagnosis Date . Abnormal MRI, cervical spine 09/29/10  Mildly abnormal MRI cervical spine demonstrating mild spondylosis and disc bulging from C4-5 down to C7-T1.  No spinal stenosis or  foraminal narrowing . Anemia  . Arthritis  . Blood transfusion without reported diagnosis  . BPH (benign prostatic hyperplasia)  . Cancer [facility])   possible melanoma on lt.leg . Cataract   beginning stage . Cervical disc herniation 09/29/10  C4-C5 and C7-T1 . Chronic kidney disease (CKD)   stage 4 per dr patel nephrology lov 10-11-2019 . Chronic left-sided headaches  . Colon polyp 2007 . Diabetes mellitus   diet dontrolled . Elevated PSA  . External hemorrhoids without mention of complication 5462 . GERD (gastroesophageal reflux disease)  . History of hypertension   no current meds for . Hydronephrosis   acute renal failure superimposed on stage 4 ckd . Hyperlipidemia  . Hypothyroidism 09/29/10  Untreated. Patient not interested in Rx.  . Intermittent self-catheterization of bladder   placed every month, foley in all the time . Knee pain, bilateral 02/05/2013  Pain since a fall in 2014 . MRI of brain abnormal 09/29/10  Abnormal MRI of brain, demonstrating mild atrophy . Neck pain on left side 2012 . Neuropathy  . Plantar fasciitis   left foot . Secondary hyperparathyroidism (  [place])  Past Surgical History: Past Surgical History: Procedure Laterality Date . AV FISTULA PLACEMENT Left 02/25/2020  Procedure: ARTERIOVENOUS (AV) FISTULA CREATION LEFT;  Surgeon: Elam Dutch, MD;  Location: [place];  Service: Vascular;  Laterality: Left; . COLONOSCOPY   . CYSTOSCOPY WITH LITHOLAPAXY N/A 11/19/2019  Procedure: CYSTOSCOPY WITH LITHOLAPAXY;  Surgeon: Lucas Mallow, MD;  Location: [facility];  Service: Urology;  Laterality: N/A; . left knee melanoma removed  2018 . MELANOMA EXCISION    left side of neck . POLYPECTOMY   . RHINOPLASTY    x2  HPI: Patient is an 86 y.o. male with PMH: CKD stage V, BPH, melanoma, diet controlled DM-2, GERD, cervical disc herniation, MRI cervical spine revealing mild spondylosis and disc bulging from C4-5 down to C7-T1, bell's palsy on right side for many years. He presented to hospital.  On 10/6, patient started having worsening SOB on exertion as well as at rest with fatigue. While walking to the mall on 10/6, he passed out and woke up on the ground. In AM on day of admission, he had episode of nausea and two episodes of vomiting. Patient's son reported that patient passed out while walking to car. CT head negative for intracranial pathology. CXR showed small right pleural effusion.  Subjective: pleasant, had just moved to unit from downstairs Assessment / Plan / Recommendation CHL IP CLINICAL IMPRESSIONS 03/17/2020 Clinical Impression Pt was seen for MBS, which revealed moderate oropharyngeal dysphagia as characterized by delayed oral transit, reduced epiglottic deflection, instances of aspiration, multiple swallows, and significant pharyngeal residue. Pt was lethargic throughout MBS, frequently reporting how tired he felt. Delayed oral tranit occurred across POs. During the swallow, epiglottic deflection was significantly reduced (nearly nonexistent) as the epiglottis stayed in a fixed, cupped position. Multiple swallows were also observed across POs. Additionally, significant residue was observed in the valleculae and pyriforms across POs. Mild palatal residue was observed with a regular solid. SLP attempted various strategies to address residue, including head turn, chin tuck, liquid wash, volitional cough/swallow, and effortful swallow. Majority of strategies were ineffective, and instances of trace aspiration of thin liquid were observed during the swallow while attempting a head turn. Use of effortful swallow yielded the best results in reducing residue. Recommend dys 3 diet and thin liquid with use of effortful swallow. Crush meds in puree. SLP will f/u acutely to reinforce/educate on use of effortful swallow and check diet tolerance.  SLP Visit Diagnosis Dysphagia, oropharyngeal phase (R13.12) Attention and concentration deficit following -- Frontal lobe and executive function deficit  following -- Impact on safety and function Moderate aspiration risk   CHL IP TREATMENT RECOMMENDATION 03/17/2020 Treatment Recommendations Therapy as outlined in treatment plan below   Prognosis 03/17/2020 Prognosis for Safe Diet Advancement Fair Barriers to Reach Goals -- Barriers/Prognosis Comment -- CHL IP DIET RECOMMENDATION 03/17/2020 SLP Diet Recommendations Dysphagia 3 (Mech soft) solids;Thin liquid Liquid Administration via Straw;Cup Medication Administration Crushed with puree Compensations Minimize environmental distractions;Slow rate;Small sips/bites;Effortful swallow Postural Changes --   CHL IP OTHER RECOMMENDATIONS 03/17/2020 Recommended Consults -- Oral Care Recommendations Oral care BID Other Recommendations --   CHL IP FOLLOW UP RECOMMENDATIONS 03/16/2020 Follow up Recommendations Other (comment)   CHL IP FREQUENCY AND DURATION 03/17/2020 Speech Therapy Frequency (ACUTE ONLY) min 2x/week Treatment Duration 2 weeks      CHL IP ORAL PHASE 03/17/2020 Oral Phase Impaired Oral - Pudding Teaspoon -- Oral - Pudding Cup -- Oral - Honey Teaspoon -- Oral - Honey Cup --  Oral - Nectar Teaspoon -- Oral - Nectar Cup -- Oral - Nectar Straw Delayed oral transit Oral - Thin Teaspoon -- Oral - Thin Cup Delayed oral transit Oral - Thin Straw Delayed oral transit Oral - Puree Delayed oral transit Oral - Mech Soft -- Oral - Regular Delayed oral transit;Lingual/palatal residue Oral - Multi-Consistency -- Oral - Pill -- Oral Phase - Comment --  CHL IP PHARYNGEAL PHASE 03/17/2020 Pharyngeal Phase Impaired Pharyngeal- Pudding Teaspoon -- Pharyngeal -- Pharyngeal- Pudding Cup -- Pharyngeal -- Pharyngeal- Honey Teaspoon -- Pharyngeal -- Pharyngeal- Honey Cup -- Pharyngeal -- Pharyngeal- Nectar Teaspoon -- Pharyngeal -- Pharyngeal- Nectar Cup -- Pharyngeal -- Pharyngeal- Nectar Straw Reduced epiglottic inversion;Pharyngeal residue - valleculae;Pharyngeal residue - pyriform;Compensatory strategies attempted (with notebox)  Pharyngeal -- Pharyngeal- Thin Teaspoon -- Pharyngeal -- Pharyngeal- Thin Cup Compensatory strategies attempted (with notebox);Pharyngeal residue - pyriform;Pharyngeal residue - valleculae;Reduced epiglottic inversion Pharyngeal -- Pharyngeal- Thin Straw Compensatory strategies attempted (with notebox);Pharyngeal residue - pyriform;Pharyngeal residue - valleculae;Reduced epiglottic inversion;Trace aspiration Pharyngeal -- Pharyngeal- Puree Pharyngeal residue - valleculae;Pharyngeal residue - pyriform;Reduced epiglottic inversion Pharyngeal -- Pharyngeal- Mechanical Soft -- Pharyngeal -- Pharyngeal- Regular Pharyngeal residue - pyriform;Pharyngeal residue - valleculae;Reduced epiglottic inversion Pharyngeal -- Pharyngeal- Multi-consistency -- Pharyngeal -- Pharyngeal- Pill -- Pharyngeal -- Pharyngeal Comment --  CHL IP CERVICAL ESOPHAGEAL PHASE 11/15/2016 Cervical Esophageal Phase WFL Pudding Teaspoon -- Pudding Cup -- Honey Teaspoon -- Honey Cup -- Nectar Teaspoon -- Nectar Cup -- Nectar Straw -- Thin Teaspoon -- Thin Cup -- Thin Straw -- Puree -- Mechanical Soft -- Regular -- Multi-consistency -- Pill -- Cervical Esophageal Comment -- Herbie Baltimore, MA CCC-SLP Acute Rehabilitation Services Pager [phone] Office [phone] Lynann Beaver 03/17/2020, 10:13 AM              ECHOCARDIOGRAM COMPLETE  Result Date: 03/16/2020    ECHOCARDIOGRAM REPORT   Patient Name:   Richard Moses Date of Exam: 03/16/2020 Medical Rec #:  [id number]      Height:       71.0 in Accession #:    [id number]     Weight:       140.0 lb Date of Birth:  26-Mar-1933      BSA:          1.812 m Patient Age:    89 years       BP:           121/75 mmHg Patient Gender: M              HR:           98 bpm. Exam Location:  Inpatient Procedure: 2D Echo Indications:    elevated troponin  History:        Patient has no prior history of Echocardiogram examinations.                 Risk Factors:Diabetes, Dyslipidemia and Former Smoker.   Sonographer:    Jannett Celestine RDCS (AE) Referring Phys: [id number] [facility] M PATEL  Sonographer Comments: no true parasternal window IMPRESSIONS  1. Severe anteroseptal and basal to mid anterior hypokinesis. . Left ventricular ejection fraction, by estimation, is 35 to 40%. The left ventricle has moderately decreased function. The left ventricle demonstrates regional wall motion abnormalities (see scoring diagram/findings for description). There is mild left ventricular hypertrophy of the basal-septal segment. Left ventricular diastolic parameters are consistent with Grade II diastolic dysfunction (pseudonormalization). Elevated left ventricular end-diastolic pressure.  2. Right ventricular systolic function is normal. The right ventricular size  is normal. There is normal pulmonary artery systolic pressure.  3. Left atrial size was mildly dilated.  4. The mitral valve is normal in structure. Trivial mitral valve regurgitation. No evidence of mitral stenosis. Moderate mitral annular calcification.  5. The aortic valve was not well visualized. Aortic valve regurgitation is not visualized. Mild aortic valve stenosis. Aortic valve mean gradient measures 16.0 mmHg. Aortic valve Vmax measures 2.42 m/s.  6. The inferior vena cava is dilated in size with <50% respiratory variability, suggesting right atrial pressure of 15 mmHg. FINDINGS  Left Ventricle: Severe anteroseptal and basal to mid anterior hypokinesis. Left ventricular ejection fraction, by estimation, is 35 to 40%. The left ventricle has moderately decreased function. The left ventricle demonstrates regional wall motion abnormalities. The left ventricular internal cavity size was normal in size. There is mild left ventricular hypertrophy of the basal-septal segment. Left ventricular diastolic parameters are consistent with Grade II diastolic dysfunction (pseudonormalization). Elevated left ventricular end-diastolic pressure. Right Ventricle: The right ventricular size  is normal. No increase in right ventricular wall thickness. Right ventricular systolic function is normal. There is normal pulmonary artery systolic pressure. The tricuspid regurgitant velocity is 2.06 m/s, and  with an assumed right atrial pressure of 15 mmHg, the estimated right ventricular systolic pressure is 53.9 mmHg. Left Atrium: Left atrial size was mildly dilated. Right Atrium: Right atrial size was normal in size. Pericardium: There is no evidence of pericardial effusion. Mitral Valve: The mitral valve is normal in structure. Moderate mitral annular calcification. Trivial mitral valve regurgitation. No evidence of mitral valve stenosis. Tricuspid Valve: The tricuspid valve is normal in structure. Tricuspid valve regurgitation is mild . No evidence of tricuspid stenosis. Aortic Valve: The aortic valve was not well visualized. Aortic valve regurgitation is not visualized. Mild aortic stenosis is present. Aortic valve mean gradient measures 16.0 mmHg. Aortic valve peak gradient measures 23.4 mmHg. Aortic valve area, by VTI  measures 0.76 cm. Pulmonic Valve: The pulmonic valve was normal in structure. Pulmonic valve regurgitation is not visualized. No evidence of pulmonic stenosis. Aorta: The aortic root is normal in size and structure. Venous: The inferior vena cava is dilated in size with less than 50% respiratory variability, suggesting right atrial pressure of 15 mmHg. IAS/Shunts: No atrial level shunt detected by color flow Doppler.  LEFT VENTRICLE PLAX 2D LVIDd:         4.60 cm  Diastology LVIDs:         3.80 cm  LV e' medial:    6.20 cm/s LV PW:         1.24 cm  LV E/e' medial:  16.9 LV IVS:        1.00 cm  LV e' lateral:   6.64 cm/s LVOT diam:     2.10 cm  LV E/e' lateral: 15.8 LV SV:         39 LV SV Index:   21 LVOT Area:     3.46 cm  LEFT ATRIUM           Index LA diam:      3.90 cm 2.15 cm/m LA Vol (A4C): 53.2 ml 29.36 ml/m  AORTIC VALVE AV Area (Vmax):    0.75 cm AV Area (Vmean):   0.74 cm  AV Area (VTI):     0.76 cm AV Vmax:           242.00 cm/s AV Vmean:          193.000 cm/s AV VTI:  0.512 m AV Peak Grad:      23.4 mmHg AV Mean Grad:      16.0 mmHg LVOT Vmax:         52.20 cm/s LVOT Vmean:        41.400 cm/s LVOT VTI:          0.112 m LVOT/AV VTI ratio: 0.22  AORTA Ao Root diam: 3.00 cm MITRAL VALVE                TRICUSPID VALVE MV Area (PHT): 4.39 cm     TR Peak grad:   17.0 mmHg MV Decel Time: 173 msec     TR Vmax:        206.00 cm/s MV E velocity: 105.00 cm/s                             SHUNTS                             Systemic VTI:  0.11 m                             Systemic Diam: 2.10 cm Skeet Latch MD Electronically signed by Skeet Latch MD Signature Date/Time: 03/16/2020/12:36:03 PM    Final     Cardiac Studies   Echocardiogram 03/16/20: 1. Severe anteroseptal and basal to mid anterior hypokinesis. . Left  ventricular ejection fraction, by estimation, is 35 to 40%. The left  ventricle has moderately decreased function. The left ventricle  demonstrates regional wall motion abnormalities  (see scoring diagram/findings for description). There is mild left  ventricular hypertrophy of the basal-septal segment. Left ventricular  diastolic parameters are consistent with Grade II diastolic dysfunction  (pseudonormalization). Elevated left  ventricular end-diastolic pressure.  2. Right ventricular systolic function is normal. The right ventricular  size is normal. There is normal pulmonary artery systolic pressure.  3. Left atrial size was mildly dilated.  4. The mitral valve is normal in structure. Trivial mitral valve  regurgitation. No evidence of mitral stenosis. Moderate mitral annular  calcification.  5. The aortic valve was not well visualized. Aortic valve regurgitation  is not visualized. Mild aortic valve stenosis. Aortic valve mean gradient  measures 16.0 mmHg. Aortic valve Vmax measures 2.42 m/s.  6. The inferior vena cava is dilated  in size with <50% respiratory  variability, suggesting right atrial pressure of 15 mmHg.   Patient Profile     86 y.o. male with PMH of HLD, DM type 2, hypothyroidism, secondary hyperparathyroidism, and CKD stage V s/p AV fistula placement 02/25/20, who is being followed by cardiology for the evaluation of chest pain and elevated troponins.  Assessment & Plan    1. NSTEMI: patient presented with 2 syncopal episodes and worsening DOE. He describes pleuritic chest pain. Initial c/f PE, though VQ scan was negative. HsTrop peaked at 7391. EKG with non-specific ST-T wave abnormalities. He reports ongoing pleuritic chest pain today. Echo 03/16/20 showed EF 35-40%, RWMA (severe anteroseptal and basal to mid anterior hypokinesis), G2DD, mild LAE, mild AS, and elevated RA pressures. Decision made to pursue a LHC to evaluate his coronary anatomy with plans to dialyze afterwards. BP is soft limiting addition of BBlocker  - Will plan for LHC today as long as patient can tolerate laying flat - will given IV lasix as below - Continue heparin  gtt for now - Continue aspirin and statin  2. Acute combined CHF: EF 35-40% on echo this admission. He describes progressive DOE and fatigue recently. Suspect underlying CAD given drop in EF, wall motion abnormalities, coronary artery calcifications on CT, and risk factors (HLD and DM type 2). He does have 2+ ankle edema on exam. Anticipate initiation of dialysis following LHC today. GDMT limited by soft blood pressures and CKD.  - Will give IV lasix 80mg  x1 dose this morning in an effort to improve orthopnea to tolerate LHC today.  - Continue to monitor strict I&Os and daily weights  3. Syncope: suspect due to poor po intake, though with reduced EF and elevated troponins cannot exclude ischemia/ventricular arrhythmia. He has maintained sinus tachycardia this admission without evidence of arrhythmia.  - Await LHC results   4. HLD: no recent lipids on file - Will add on  lipid panel to today's labs - Continue crestor for now  5. CKD stage V: AV fistula in place, maturing. Nephrology following in anticipation of LHC and likely need to start dialysis this admission - Continue management per nephrology  For questions or updates, please contact [place] Please consult [email or web address] for contact info under Cardiology/STEMI.      Signed, Abigail Butts, PA-C  03/17/2020, 10:20 AM   [phone] d

## 2020-03-18 ENCOUNTER — Encounter (HOSPITAL_COMMUNITY): Admission: EM | Disposition: E | Payer: Self-pay | Source: Home / Self Care | Attending: Internal Medicine

## 2020-03-18 DIAGNOSIS — N185 Chronic kidney disease, stage 5: Secondary | ICD-10-CM | POA: Diagnosis not present

## 2020-03-18 DIAGNOSIS — I214 Non-ST elevation (NSTEMI) myocardial infarction: Secondary | ICD-10-CM | POA: Diagnosis not present

## 2020-03-18 DIAGNOSIS — N186 End stage renal disease: Secondary | ICD-10-CM | POA: Diagnosis not present

## 2020-03-18 DIAGNOSIS — R778 Other specified abnormalities of plasma proteins: Secondary | ICD-10-CM | POA: Diagnosis not present

## 2020-03-18 DIAGNOSIS — Z515 Encounter for palliative care: Secondary | ICD-10-CM

## 2020-03-18 DIAGNOSIS — Z992 Dependence on renal dialysis: Secondary | ICD-10-CM

## 2020-03-18 HISTORY — PX: LEFT HEART CATH AND CORONARY ANGIOGRAPHY: CATH118249

## 2020-03-18 LAB — CBC
HCT: 32.3 % — ABNORMAL LOW (ref 39.0–52.0)
Hemoglobin: 9.8 g/dL — ABNORMAL LOW (ref 13.0–17.0)
MCH: 31.1 pg (ref 26.0–34.0)
MCHC: 30.3 g/dL (ref 30.0–36.0)
MCV: 102.5 fL — ABNORMAL HIGH (ref 80.0–100.0)
Platelets: UNDETERMINED 10*3/uL (ref 150–400)
RBC: 3.15 MIL/uL — ABNORMAL LOW (ref 4.22–5.81)
RDW: 15.6 % — ABNORMAL HIGH (ref 11.5–15.5)
WBC: 7.8 10*3/uL (ref 4.0–10.5)
nRBC: 0 % (ref 0.0–0.2)

## 2020-03-18 LAB — BASIC METABOLIC PANEL
Anion gap: 17 — ABNORMAL HIGH (ref 5–15)
BUN: 104 mg/dL — ABNORMAL HIGH (ref 8–23)
CO2: 13 mmol/L — ABNORMAL LOW (ref 22–32)
Calcium: 8.4 mg/dL — ABNORMAL LOW (ref 8.9–10.3)
Chloride: 104 mmol/L (ref 98–111)
Creatinine, Ser: 6.35 mg/dL — ABNORMAL HIGH (ref 0.61–1.24)
GFR, Estimated: 7 mL/min — ABNORMAL LOW (ref >60)
Glucose, Bld: 220 mg/dL — ABNORMAL HIGH (ref 70–99)
Potassium: 4.6 mmol/L (ref 3.5–5.1)
Sodium: 134 mmol/L — ABNORMAL LOW (ref 135–145)

## 2020-03-18 LAB — HEPARIN LEVEL (UNFRACTIONATED): Heparin Unfractionated: 0.22 IU/mL — ABNORMAL LOW (ref 0.30–0.70)

## 2020-03-18 SURGERY — LEFT HEART CATH AND CORONARY ANGIOGRAPHY
Anesthesia: LOCAL

## 2020-03-18 MED ORDER — FUROSEMIDE 10 MG/ML IJ SOLN
160.0000 mg | Freq: Four times a day (QID) | INTRAVENOUS | Status: DC
  Administered 2020-03-18 – 2020-03-19 (×4): 160 mg via INTRAVENOUS
  Filled 2020-03-18 (×3): qty 16
  Filled 2020-03-18: qty 10
  Filled 2020-03-18: qty 16
  Filled 2020-03-18: qty 10
  Filled 2020-03-18 (×3): qty 16

## 2020-03-18 MED ORDER — SODIUM CHLORIDE 0.9 % IV SOLN: 250.0000 mL | INTRAVENOUS | Status: DC | PRN

## 2020-03-18 MED ORDER — SODIUM BICARBONATE 650 MG PO TABS
650.0000 mg | ORAL_TABLET | Freq: Two times a day (BID) | ORAL | Status: DC
  Administered 2020-03-18 – 2020-03-21 (×7): 650 mg via ORAL
  Filled 2020-03-18 (×7): qty 1

## 2020-03-18 MED ORDER — ASPIRIN 81 MG PO CHEW
81.0000 mg | CHEWABLE_TABLET | ORAL | Status: AC
  Administered 2020-03-18: 81 mg via ORAL
  Filled 2020-03-18: qty 1

## 2020-03-18 MED ORDER — SODIUM CHLORIDE 0.9 % IV SOLN: INTRAVENOUS | Status: DC

## 2020-03-18 MED ORDER — SODIUM CHLORIDE 0.9% FLUSH: 3.0000 mL | INTRAVENOUS | Status: DC | PRN

## 2020-03-18 MED ORDER — METOLAZONE 5 MG PO TABS
10.0000 mg | ORAL_TABLET | Freq: Every day | ORAL | Status: DC
  Administered 2020-03-18 – 2020-03-19 (×2): 10 mg via ORAL
  Filled 2020-03-18 (×2): qty 2

## 2020-03-18 MED ORDER — LIDOCAINE HCL URETHRAL/MUCOSAL 2 % EX GEL
1.0000 "application " | Freq: Once | CUTANEOUS | Status: AC
  Administered 2020-03-19: 1 via URETHRAL
  Filled 2020-03-18: qty 11

## 2020-03-18 SURGICAL SUPPLY — 6 items
KIT HEART LEFT (KITS) ×2 IMPLANT
PACK CARDIAC CATHETERIZATION (CUSTOM PROCEDURE TRAY) ×2 IMPLANT
SHEATH PINNACLE 5F 10CM (SHEATH) ×1 IMPLANT
TRANSDUCER W/STOPCOCK (MISCELLANEOUS) ×2 IMPLANT
TUBING CIL FLEX 10 FLL-RA (TUBING) ×2 IMPLANT
WIRE EMERALD 3MM-J .035X150CM (WIRE) ×1 IMPLANT

## 2020-03-18 NOTE — Progress Notes (Addendum)
   Upon arrival in the Cath Lab, the patient complained of significant orthopnea and wanted to sit up.  O2 saturations however were 95%.  In the context of the patient's presentation, inability to lie flat due to extreme shortness of breath, stage V CKD with  indecision about dialysis, I decided to cancel the procedure.    Discussed with Dr. Oval Linsey.

## 2020-03-18 NOTE — Progress Notes (Signed)
Occupational Therapy Evaluation Patient Details Name: Richard Moses MRN: [id number] DOB: 1932/11/24 Today's Date: 04/01/2020    History of Present Illness 86yo male presenting with recent recurrent syncopal events, pleuritic chest pain, and increasing SOB/DOE. Also with recent unintentional weight loss. CTH and CXR clear, per imaging report also low risk for PE. High sensitivity troponins found to be >7000. Admitted wtih concerns of elevated troponins and potential NSTEMI. PMH knee pain, HLD, permanent foley cath, HTN, DM, CKD, CA   Clinical Impression   PTA, pt lived his his caregiver who assisted with errands, cooking/cleaning. Pt was able to complete his own ADL tasks and ambulate @ modified independent level. Pt demonstrates a significant functional decline and required mod A to move to EOB and only able to tolerate sitting EOB 3 min before asking to return to supine due to fatigue and increased SOB. BP supine 102/70 sitting 99/72; return to supine 111/77; Max HE sitting 115; SpO2 95 2L; max RR 30, which decreased once returned to supine. Concerned that pt will be able tolerate chair position for dialysis at this time given poor activity tolerance and increased complaints of SOB in sitting. Will follow acutely.     Follow Up Recommendations  SNF;Supervision/Assistance - 24 hour (pending decision regarding dialysis)    Equipment Recommendations  Other (comment) (TBA)    Recommendations for Other Services       Precautions / Restrictions Precautions Precautions: Fall Precaution Comments: watch HR/O2/BP Restrictions Weight Bearing Restrictions: No      Mobility Bed Mobility Overal bed mobility: Needs Assistance Bed Mobility: Supine to Sit;Sit to Supine     Supine to sit: Mod assist Sit to supine: Max assist      Transfers                 General transfer comment: too fatigued to attempt    Balance Overall balance assessment: Needs assistance   Sitting  balance-Leahy Scale: Fair                                     ADL either performed or assessed with clinical judgement   ADL Overall ADL's : Needs assistance/impaired Eating/Feeding: Set up Eating/Feeding Details (indicate cue type and reason): poor PO intake Grooming: Minimal assistance;Sitting;Bed level Grooming Details (indicate cue type and reason): lmited by fatigue Upper Body Bathing: Minimal assistance;Bed level   Lower Body Bathing: Maximal assistance;Bed level   Upper Body Dressing : Moderate assistance;Sitting   Lower Body Dressing: Maximal assistance;Sitting/lateral leans   Toilet Transfer:  (unable to attempt)   Toileting- Clothing Manipulation and Hygiene: Maximal assistance       Functional mobility during ADLs: Moderate assistance (bed mobility only)       Vision         Perception     Praxis      Pertinent Vitals/Pain Pain Assessment: Faces Faces Pain Scale: Hurts little more Pain Location: generalized discomfort Pain Descriptors / Indicators: Discomfort Pain Intervention(s): Limited activity within patient's tolerance     Hand Dominance Right   Extremity/Trunk Assessment Upper Extremity Assessment Upper Extremity Assessment: Generalized weakness;RUE deficits/detail;LUE deficits/detail RUE Deficits / Details: FF to 90; hx of RTC insufficiency B shoulders RUE Coordination: decreased gross motor LUE Deficits / Details: similar to R   Lower Extremity Assessment Lower Extremity Assessment: Defer to PT evaluation   Cervical / Trunk Assessment Cervical / Trunk Assessment: Kyphotic   Communication  Communication Communication: HOH (wears hearing aids)   Cognition Arousal/Alertness: Awake/alert Behavior During Therapy: WFL for tasks assessed/performed Overall Cognitive Status: No family/caregiver present to determine baseline cognitive functioning                                 General Comments: most likely close  to baseline; appropriate for functional tasks   General Comments  Able to achieve sitting EOB and maintain sitting @ 3 min before complaining of increased SOB adn fatigue adn requesting to return to supine. Left in chair position at bed level however nsg arrived and stated pt had to lie supine to see if could tolerate supine position for cardiac cath.     Exercises Exercises: Other exercises Other Exercises Other Exercises: encouraged pt to complete BU/LE general AROM at bed level   Shoulder Instructions      Home Living Family/patient expects to be discharged to:: Private residence Living Arrangements: Non-relatives/Friends Available Help at Discharge: Other (Comment) (has live in PCA but unsure if 24/7 available) Type of Home: House Home Access: Stairs to enter [employer] of Steps: unclear on # of steps, but does have right rail Entrance Stairs-Rails: Right Home Layout: Two level Alternate Level Stairs-Number of Steps: flight but has stair lift that he takes upstairs where his bedroom is   Bathroom Shower/Tub: Occupational psychologist: Standard Bathroom Accessibility: Yes How Accessible: Accessible via walker Home Equipment: [place] - 2 wheels;Cane - single point;Shower seat - built in;Grab bars - tub/shower          Prior Functioning/Environment Level of Independence: Independent with assistive device(s);Needs assistance  Gait / Transfers Assistance Needed: used cane as needed ADL's / Homemaking Assistance Needed: Did his own bathing/dressing; PCA cooked/cleaned/ran errands            OT Problem List: Decreased strength;Decreased range of motion;Decreased activity tolerance;Impaired balance (sitting and/or standing);Decreased coordination;Decreased safety awareness;Decreased knowledge of use of DME or AE;Cardiopulmonary status limiting activity;Pain      OT Treatment/Interventions: Self-care/ADL training;Therapeutic exercise;Neuromuscular  education;Energy conservation;DME and/or AE instruction;Therapeutic activities;Patient/family education;Balance training    OT Goals(Current goals can be found in the care plan section) Acute Rehab OT Goals Patient Stated Goal: to make a decision regarding dialysis OT Goal Formulation: With patient Time For Goal Achievement: 04/01/20 Potential to Achieve Goals: Good  OT Frequency: Min 2X/week   Barriers to D/C:            Co-evaluation              AM-PAC OT "6 Clicks" Daily Activity     Outcome Measure Help from another person eating meals?: A Little Help from another person taking care of personal grooming?: A Little Help from another person toileting, which includes using toliet, bedpan, or urinal?: A Lot Help from another person bathing (including washing, rinsing, drying)?: A Lot Help from another person to put on and taking off regular upper body clothing?: A Lot Help from another person to put on and taking off regular lower body clothing?: A Lot 6 Click Score: 14   End of Session Equipment Utilized During Treatment: Oxygen (2L) Nurse Communication: Mobility status;Other (comment) (ctivity tolerance)  Activity Tolerance: Patient limited by fatigue Patient left: in bed;with call bell/phone within reach;with bed alarm set;with nursing/sitter in room  OT Visit Diagnosis: Other abnormalities of gait and mobility (R26.89);Muscle weakness (generalized) (M62.81);Pain Pain - Right/Left: Left Pain - part of body:  Knee (generalized discomfort)                Time: 1856-3149 OT Time Calculation (min): 22 min Charges:  OT General Charges $OT Visit: 1 Visit OT Evaluation $OT Eval Moderate Complexity: [place], OT/L   Acute OT Clinical Specialist Acute Rehabilitation Services Pager [phone] Office [phone]   [facility] 04/05/2020, 11:18 AM

## 2020-03-18 NOTE — Progress Notes (Signed)
Progress Note  Patient Name: Richard Moses Date of Encounter: 04/01/2020  Primary Cardiologist: Skeet Latch, MD   Subjective   Feeling okay this morning. Still has some pleuritic chest pain. Breathing is improved. No complaints of palpitations. Discussed wishes to pursue dialysis with the patient - he is still unsure. Hoping to talk about this further with his son, nephrology, and palliative care.   Inpatient Medications    Scheduled Meds: . aspirin EC  81 mg Oral Daily  . Chlorhexidine Gluconate Cloth  6 each Topical Daily  . feeding supplement (NEPRO CARB STEADY)  237 mL Oral BID BM  . mouth rinse  15 mL Mouth Rinse BID  . midodrine  5 mg Oral TID WC  . pantoprazole  40 mg Oral Daily  . rosuvastatin  20 mg Oral Daily  . sodium chloride flush  3 mL Intravenous Q12H   Continuous Infusions: . sodium chloride    . sodium chloride 10 mL/hr at 03/17/2020 0551  . furosemide 120 mg (04/04/2020 0810)  . heparin 1,250 Units/hr (04/01/2020 0733)   PRN Meds: sodium chloride, acetaminophen **OR** acetaminophen, morphine injection, ondansetron **OR** ondansetron (ZOFRAN) IV, sodium chloride flush, traMADol   Vital Signs    Vitals:   03/17/20 1705 03/17/20 2124 03/17/20 2315 03/29/2020 0530  BP: 101/77 94/64 97/69  101/87  Pulse: (!) 110 (!) 109 (!) 112 (!) 109  Resp: 19 (!) 22 20 (!) 23  Temp: 97.8 F (36.6 C) 97.6 F (36.4 C) 98.1 F (36.7 C) (!) 97.5 F (36.4 C)  TempSrc: Oral Oral Oral Oral  SpO2: 98% 96% 97% 98%  Weight:    65.8 kg  Height:        Intake/Output Summary (Last 24 hours) at 03/29/2020 0819 Last data filed at 04/02/2020 0603 Gross per 24 hour  Intake 265.14 ml  Output 600 ml  Net -334.86 ml   Filed Weights   03/16/20 1553 03/17/20 0437 03/17/2020 0530  Weight: 95.7 kg 67.1 kg 65.8 kg    Telemetry    Sinus tachycardia  - Personally Reviewed  ECG    No new tracings - Personally Reviewed  Physical Exam   GEN: No acute distress.   Neck: No JVD,  no carotid bruits Cardiac: RRR, +murmur, no rubs or gallops.  Respiratory: Clear to auscultation bilaterally, no wheezes/ rales/ rhonchi GI: NABS, Soft, nontender, non-distended  MS: trace-1+ LE edema; No deformity. Neuro:  Nonfocal, moving all extremities spontaneously Psych: Normal affect   Labs    Chemistry Recent Labs  Lab 03/13/2020 1529 03/28/2020 1529 03/16/20 0521 03/17/20 0344 03/07/2020 0420  NA 134*   < > 135 134* 134*  K 4.7   < > 4.4 4.5 4.6  CL 103   < > 107 105 104  CO2 17*   < > 15* 13* 13*  GLUCOSE 202*   < > 129* 201* 220*  BUN 80*   < > 82* 87* 104*  CREATININE 6.27*   < > 5.62* 5.72* 6.35*  CALCIUM 8.9   < > 8.5* 8.3* 8.4*  PROT 7.2  --  6.4*  --   --   ALBUMIN 3.6  --  3.3*  --   --   AST 74*  --  64*  --   --   ALT 25  --  25  --   --   ALKPHOS 65  --  65  --   --   BILITOT 0.8  --  0.7  --   --  GFRNONAA 7*   < > 8* 8* 7*  ANIONGAP 14   < > 13 16* 17*   < > = values in this interval not displayed.     Hematology Recent Labs  Lab 03/16/20 0521 03/17/20 0344 03/30/2020 0420  WBC 7.8 10.8* 7.8  RBC 3.19* 3.13* 3.15*  HGB 9.9* 9.6* 9.8*  HCT 33.0* 31.6* 32.3*  MCV 103.4* 101.0* 102.5*  MCH 31.0 30.7 31.1  MCHC 30.0 30.4 30.3  RDW 15.9* 15.5 15.6*  PLT 127* PLATELET CLUMPS NOTED ON SMEAR, UNABLE TO ESTIMATE PLATELET CLUMPS NOTED ON SMEAR, UNABLE TO ESTIMATE    Cardiac EnzymesNo results for input(s): TROPONINI in the last 168 hours. No results for input(s): TROPIPOC in the last 168 hours.   BNP Recent Labs  Lab 03/17/2020 1530  BNP 4,445.6*     DDimer No results for input(s): DDIMER in the last 168 hours.   Radiology    DG Swallowing Func-Speech Pathology  Result Date: 03/17/2020 Objective Swallowing Evaluation: Type of Study: MBS-Modified Barium Swallow Study  Patient Details Name: Richard Moses MRN: [id number] Date of Birth: 07/22/1932 Today's Date: 03/17/2020 Time: SLP Start Time (ACUTE ONLY): 0831 -SLP Stop Time (ACUTE ONLY): 0856 SLP  Time Calculation (min) (ACUTE ONLY): 25 min Past Medical History: Past Medical History: Diagnosis Date . Abnormal MRI, cervical spine 09/29/10  Mildly abnormal MRI cervical spine demonstrating mild spondylosis and disc bulging from C4-5 down to C7-T1.  No spinal stenosis or foraminal narrowing . Anemia  . Arthritis  . Blood transfusion without reported diagnosis  . BPH (benign prostatic hyperplasia)  . Cancer [facility])   possible melanoma on lt.leg . Cataract   beginning stage . Cervical disc herniation 09/29/10  C4-C5 and C7-T1 . Chronic kidney disease (CKD)   stage 4 per dr patel nephrology lov 10-11-2019 . Chronic left-sided headaches  . Colon polyp 2007 . Diabetes mellitus   diet dontrolled . Elevated PSA  . External hemorrhoids without mention of complication 4098 . GERD (gastroesophageal reflux disease)  . History of hypertension   no current meds for . Hydronephrosis   acute renal failure superimposed on stage 4 ckd . Hyperlipidemia  . Hypothyroidism 09/29/10  Untreated. Patient not interested in Rx.  . Intermittent self-catheterization of bladder   placed every month, foley in all the time . Knee pain, bilateral 02/05/2013  Pain since a fall in 2014 . MRI of brain abnormal 09/29/10  Abnormal MRI of brain, demonstrating mild atrophy . Neck pain on left side 2012 . Neuropathy  . Plantar fasciitis   left foot . Secondary hyperparathyroidism [facility])  Past Surgical History: Past Surgical History: Procedure Laterality Date . AV FISTULA PLACEMENT Left 02/25/2020  Procedure: ARTERIOVENOUS (AV) FISTULA CREATION LEFT;  Surgeon: Elam Dutch, MD;  Location: [place];  Service: Vascular;  Laterality: Left; . COLONOSCOPY   . CYSTOSCOPY WITH LITHOLAPAXY N/A 11/19/2019  Procedure: CYSTOSCOPY WITH LITHOLAPAXY;  Surgeon: Lucas Mallow, MD;  Location: [facility];  Service: Urology;  Laterality: N/A; . left knee melanoma removed  2018 . MELANOMA EXCISION    left side of neck . POLYPECTOMY   . RHINOPLASTY    x2  HPI:  Patient is an 86 y.o. male with PMH: CKD stage V, BPH, melanoma, diet controlled DM-2, GERD, cervical disc herniation, MRI cervical spine revealing mild spondylosis and disc bulging from C4-5 down to C7-T1, bell's palsy on right side for many years. He presented to hospital. On 10/6, patient started having worsening  SOB on exertion as well as at rest with fatigue. While walking to the mall on 10/6, he passed out and woke up on the ground. In AM on day of admission, he had episode of nausea and two episodes of vomiting. Patient's son reported that patient passed out while walking to car. CT head negative for intracranial pathology. CXR showed small right pleural effusion.  Subjective: pleasant, had just moved to unit from downstairs Assessment / Plan / Recommendation CHL IP CLINICAL IMPRESSIONS 03/17/2020 Clinical Impression Pt was seen for MBS, which revealed moderate oropharyngeal dysphagia as characterized by delayed oral transit, reduced epiglottic deflection, instances of aspiration, multiple swallows, and significant pharyngeal residue. Pt was lethargic throughout MBS, frequently reporting how tired he felt. Delayed oral tranit occurred across POs. During the swallow, epiglottic deflection was significantly reduced (nearly nonexistent) as the epiglottis stayed in a fixed, cupped position. Multiple swallows were also observed across POs. Additionally, significant residue was observed in the valleculae and pyriforms across POs. Mild palatal residue was observed with a regular solid. SLP attempted various strategies to address residue, including head turn, chin tuck, liquid wash, volitional cough/swallow, and effortful swallow. Majority of strategies were ineffective, and instances of trace aspiration of thin liquid were observed during the swallow while attempting a head turn. Use of effortful swallow yielded the best results in reducing residue. Recommend dys 3 diet and thin liquid with use of effortful swallow.  Crush meds in puree. SLP will f/u acutely to reinforce/educate on use of effortful swallow and check diet tolerance.  SLP Visit Diagnosis Dysphagia, oropharyngeal phase (R13.12) Attention and concentration deficit following -- Frontal lobe and executive function deficit following -- Impact on safety and function Moderate aspiration risk   CHL IP TREATMENT RECOMMENDATION 03/17/2020 Treatment Recommendations Therapy as outlined in treatment plan below   Prognosis 03/17/2020 Prognosis for Safe Diet Advancement Fair Barriers to Reach Goals -- Barriers/Prognosis Comment -- CHL IP DIET RECOMMENDATION 03/17/2020 SLP Diet Recommendations Dysphagia 3 (Mech soft) solids;Thin liquid Liquid Administration via Straw;Cup Medication Administration Crushed with puree Compensations Minimize environmental distractions;Slow rate;Small sips/bites;Effortful swallow Postural Changes --   CHL IP OTHER RECOMMENDATIONS 03/17/2020 Recommended Consults -- Oral Care Recommendations Oral care BID Other Recommendations --   CHL IP FOLLOW UP RECOMMENDATIONS 03/16/2020 Follow up Recommendations Other (comment)   CHL IP FREQUENCY AND DURATION 03/17/2020 Speech Therapy Frequency (ACUTE ONLY) min 2x/week Treatment Duration 2 weeks      CHL IP ORAL PHASE 03/17/2020 Oral Phase Impaired Oral - Pudding Teaspoon -- Oral - Pudding Cup -- Oral - Honey Teaspoon -- Oral - Honey Cup -- Oral - Nectar Teaspoon -- Oral - Nectar Cup -- Oral - Nectar Straw Delayed oral transit Oral - Thin Teaspoon -- Oral - Thin Cup Delayed oral transit Oral - Thin Straw Delayed oral transit Oral - Puree Delayed oral transit Oral - Mech Soft -- Oral - Regular Delayed oral transit;Lingual/palatal residue Oral - Multi-Consistency -- Oral - Pill -- Oral Phase - Comment --  CHL IP PHARYNGEAL PHASE 03/17/2020 Pharyngeal Phase Impaired Pharyngeal- Pudding Teaspoon -- Pharyngeal -- Pharyngeal- Pudding Cup -- Pharyngeal -- Pharyngeal- Honey Teaspoon -- Pharyngeal -- Pharyngeal- Honey Cup  -- Pharyngeal -- Pharyngeal- Nectar Teaspoon -- Pharyngeal -- Pharyngeal- Nectar Cup -- Pharyngeal -- Pharyngeal- Nectar Straw Reduced epiglottic inversion;Pharyngeal residue - valleculae;Pharyngeal residue - pyriform;Compensatory strategies attempted (with notebox) Pharyngeal -- Pharyngeal- Thin Teaspoon -- Pharyngeal -- Pharyngeal- Thin Cup Compensatory strategies attempted (with notebox);Pharyngeal residue - pyriform;Pharyngeal residue - valleculae;Reduced epiglottic inversion Pharyngeal --  Pharyngeal- Thin Straw Compensatory strategies attempted (with notebox);Pharyngeal residue - pyriform;Pharyngeal residue - valleculae;Reduced epiglottic inversion;Trace aspiration Pharyngeal -- Pharyngeal- Puree Pharyngeal residue - valleculae;Pharyngeal residue - pyriform;Reduced epiglottic inversion Pharyngeal -- Pharyngeal- Mechanical Soft -- Pharyngeal -- Pharyngeal- Regular Pharyngeal residue - pyriform;Pharyngeal residue - valleculae;Reduced epiglottic inversion Pharyngeal -- Pharyngeal- Multi-consistency -- Pharyngeal -- Pharyngeal- Pill -- Pharyngeal -- Pharyngeal Comment --  CHL IP CERVICAL ESOPHAGEAL PHASE 11/15/2016 Cervical Esophageal Phase WFL Pudding Teaspoon -- Pudding Cup -- Honey Teaspoon -- Honey Cup -- Nectar Teaspoon -- Nectar Cup -- Nectar Straw -- Thin Teaspoon -- Thin Cup -- Thin Straw -- Puree -- Mechanical Soft -- Regular -- Multi-consistency -- Pill -- Cervical Esophageal Comment -- Herbie Baltimore, MA CCC-SLP Acute Rehabilitation Services Pager [phone] Office [phone] Lynann Beaver 03/17/2020, 10:13 AM              ECHOCARDIOGRAM COMPLETE  Result Date: 03/16/2020    ECHOCARDIOGRAM REPORT   Patient Name:   DRAYLON MERCADEL Date of Exam: 03/16/2020 Medical Rec #:  [id number]      Height:       71.0 in Accession #:    [id number]     Weight:       140.0 lb Date of Birth:  [birth year]      BSA:          1.812 m Patient Age:    59 years       BP:           121/75 mmHg Patient Gender: M               HR:           98 bpm. Exam Location:  Inpatient Procedure: 2D Echo Indications:    elevated troponin  History:        Patient has no prior history of Echocardiogram examinations.                 Risk Factors:Diabetes, Dyslipidemia and Former Smoker.  Sonographer:    Jannett Celestine RDCS (AE) Referring Phys: [id number] [facility] M PATEL  Sonographer Comments: no true parasternal window IMPRESSIONS  1. Severe anteroseptal and basal to mid anterior hypokinesis. . Left ventricular ejection fraction, by estimation, is 35 to 40%. The left ventricle has moderately decreased function. The left ventricle demonstrates regional wall motion abnormalities (see scoring diagram/findings for description). There is mild left ventricular hypertrophy of the basal-septal segment. Left ventricular diastolic parameters are consistent with Grade II diastolic dysfunction (pseudonormalization). Elevated left ventricular end-diastolic pressure.  2. Right ventricular systolic function is normal. The right ventricular size is normal. There is normal pulmonary artery systolic pressure.  3. Left atrial size was mildly dilated.  4. The mitral valve is normal in structure. Trivial mitral valve regurgitation. No evidence of mitral stenosis. Moderate mitral annular calcification.  5. The aortic valve was not well visualized. Aortic valve regurgitation is not visualized. Mild aortic valve stenosis. Aortic valve mean gradient measures 16.0 mmHg. Aortic valve Vmax measures 2.42 m/s.  6. The inferior vena cava is dilated in size with <50% respiratory variability, suggesting right atrial pressure of 15 mmHg. FINDINGS  Left Ventricle: Severe anteroseptal and basal to mid anterior hypokinesis. Left ventricular ejection fraction, by estimation, is 35 to 40%. The left ventricle has moderately decreased function. The left ventricle demonstrates regional wall motion abnormalities. The left ventricular internal cavity size was normal in size. There is mild  left ventricular hypertrophy of the basal-septal segment. Left ventricular diastolic parameters are  consistent with Grade II diastolic dysfunction (pseudonormalization). Elevated left ventricular end-diastolic pressure. Right Ventricle: The right ventricular size is normal. No increase in right ventricular wall thickness. Right ventricular systolic function is normal. There is normal pulmonary artery systolic pressure. The tricuspid regurgitant velocity is 2.06 m/s, and  with an assumed right atrial pressure of 15 mmHg, the estimated right ventricular systolic pressure is 10.1 mmHg. Left Atrium: Left atrial size was mildly dilated. Right Atrium: Right atrial size was normal in size. Pericardium: There is no evidence of pericardial effusion. Mitral Valve: The mitral valve is normal in structure. Moderate mitral annular calcification. Trivial mitral valve regurgitation. No evidence of mitral valve stenosis. Tricuspid Valve: The tricuspid valve is normal in structure. Tricuspid valve regurgitation is mild . No evidence of tricuspid stenosis. Aortic Valve: The aortic valve was not well visualized. Aortic valve regurgitation is not visualized. Mild aortic stenosis is present. Aortic valve mean gradient measures 16.0 mmHg. Aortic valve peak gradient measures 23.4 mmHg. Aortic valve area, by VTI  measures 0.76 cm. Pulmonic Valve: The pulmonic valve was normal in structure. Pulmonic valve regurgitation is not visualized. No evidence of pulmonic stenosis. Aorta: The aortic root is normal in size and structure. Venous: The inferior vena cava is dilated in size with less than 50% respiratory variability, suggesting right atrial pressure of 15 mmHg. IAS/Shunts: No atrial level shunt detected by color flow Doppler.  LEFT VENTRICLE PLAX 2D LVIDd:         4.60 cm  Diastology LVIDs:         3.80 cm  LV e' medial:    6.20 cm/s LV PW:         1.24 cm  LV E/e' medial:  16.9 LV IVS:        1.00 cm  LV e' lateral:   6.64 cm/s LVOT  diam:     2.10 cm  LV E/e' lateral: 15.8 LV SV:         39 LV SV Index:   21 LVOT Area:     3.46 cm  LEFT ATRIUM           Index LA diam:      3.90 cm 2.15 cm/m LA Vol (A4C): 53.2 ml 29.36 ml/m  AORTIC VALVE AV Area (Vmax):    0.75 cm AV Area (Vmean):   0.74 cm AV Area (VTI):     0.76 cm AV Vmax:           242.00 cm/s AV Vmean:          193.000 cm/s AV VTI:            0.512 m AV Peak Grad:      23.4 mmHg AV Mean Grad:      16.0 mmHg LVOT Vmax:         52.20 cm/s LVOT Vmean:        41.400 cm/s LVOT VTI:          0.112 m LVOT/AV VTI ratio: 0.22  AORTA Ao Root diam: 3.00 cm MITRAL VALVE                TRICUSPID VALVE MV Area (PHT): 4.39 cm     TR Peak grad:   17.0 mmHg MV Decel Time: 173 msec     TR Vmax:        206.00 cm/s MV E velocity: 105.00 cm/s  SHUNTS                             Systemic VTI:  0.11 m                             Systemic Diam: 2.10 cm Skeet Latch MD Electronically signed by Skeet Latch MD Signature Date/Time: 03/16/2020/12:36:03 PM    Final    VAS [place] LOWER EXTREMITY VENOUS (DVT)  Result Date: 03/17/2020  Lower Venous DVTStudy Indications: Edema.  Risk Factors: CKD. Anticoagulation: Heparin. Performing Technologist: Griffin Basil RCT RDMS  Examination Guidelines: A complete evaluation includes B-mode imaging, spectral Doppler, color Doppler, and power Doppler as needed of all accessible portions of each vessel. Bilateral testing is considered an integral part of a complete examination. Limited examinations for reoccurring indications may be performed as noted. The reflux portion of the exam is performed with the patient in reverse Trendelenburg.  +---------+---------------+---------+-----------+----------+--------------+ RIGHT    CompressibilityPhasicitySpontaneityPropertiesThrombus Aging +---------+---------------+---------+-----------+----------+--------------+ CFV      Full           Yes      Yes                                  +---------+---------------+---------+-----------+----------+--------------+ SFJ      Full                                                        +---------+---------------+---------+-----------+----------+--------------+ FV Prox  Full                                                        +---------+---------------+---------+-----------+----------+--------------+ FV Mid   Full                                                        +---------+---------------+---------+-----------+----------+--------------+ FV DistalFull                                                        +---------+---------------+---------+-----------+----------+--------------+ PFV      Full                                                        +---------+---------------+---------+-----------+----------+--------------+ POP      Full           Yes      Yes                                 +---------+---------------+---------+-----------+----------+--------------+  PTV      Full                                                        +---------+---------------+---------+-----------+----------+--------------+ PERO     Full                                                        +---------+---------------+---------+-----------+----------+--------------+   +---------+---------------+---------+-----------+----------+--------------+ LEFT     CompressibilityPhasicitySpontaneityPropertiesThrombus Aging +---------+---------------+---------+-----------+----------+--------------+ CFV      Full           Yes      Yes                                 +---------+---------------+---------+-----------+----------+--------------+ SFJ      Full                                                        +---------+---------------+---------+-----------+----------+--------------+ FV Prox  Full                                                         +---------+---------------+---------+-----------+----------+--------------+ FV Mid   Full                                                        +---------+---------------+---------+-----------+----------+--------------+ FV DistalFull                                                        +---------+---------------+---------+-----------+----------+--------------+ PFV      Full                                                        +---------+---------------+---------+-----------+----------+--------------+ POP      Full           Yes      Yes                                 +---------+---------------+---------+-----------+----------+--------------+ PTV      Full                                                        +---------+---------------+---------+-----------+----------+--------------+  PERO     Full                                                        +---------+---------------+---------+-----------+----------+--------------+     Summary: RIGHT: - There is no evidence of deep vein thrombosis in the lower extremity.  - No cystic structure found in the popliteal fossa.  LEFT: - There is no evidence of deep vein thrombosis in the lower extremity.  - No cystic structure found in the popliteal fossa.  *See table(s) above for measurements and observations. Electronically signed by Monica Martinez MD on 03/17/2020 at 5:29:28 PM.    Final     Cardiac Studies   Echocardiogram 03/16/20: 1. Severe anteroseptal and basal to mid anterior hypokinesis. . Left  ventricular ejection fraction, by estimation, is 35 to 40%. The left  ventricle has moderately decreased function. The left ventricle  demonstrates regional wall motion abnormalities  (see scoring diagram/findings for description). There is mild left  ventricular hypertrophy of the basal-septal segment. Left ventricular  diastolic parameters are consistent with Grade II diastolic dysfunction    (pseudonormalization). Elevated left  ventricular end-diastolic pressure.  2. Right ventricular systolic function is normal. The right ventricular  size is normal. There is normal pulmonary artery systolic pressure.  3. Left atrial size was mildly dilated.  4. The mitral valve is normal in structure. Trivial mitral valve  regurgitation. No evidence of mitral stenosis. Moderate mitral annular  calcification.  5. The aortic valve was not well visualized. Aortic valve regurgitation  is not visualized. Mild aortic valve stenosis. Aortic valve mean gradient  measures 16.0 mmHg. Aortic valve Vmax measures 2.42 m/s.  6. The inferior vena cava is dilated in size with <50% respiratory  variability, suggesting right atrial pressure of 15 mmHg.    Patient Profile     86 y.o. male with PMH of HLD, DM type 2, hypothyroidism, secondary hyperparathyroidism, and CKD stage V s/p AV fistula placement 02/25/20, who is being followed by cardiology for the evaluation of chest pain and elevated troponins.  Assessment & Plan    1. NSTEMI: patient presented with 2 syncopal episodes and worsening DOE. He describes pleuritic chest pain. Initial c/f PE, though VQ scan and LE dopplers were negative. HsTrop peaked at 7391. EKG with non-specific ST-T wave abnormalities. He reports ongoing pleuritic chest pain today. Echo 03/16/20 showed EF 35-40%, RWMA (severe anteroseptal and basal to mid anterior hypokinesis), G2DD, mild LAE, mild AS, and elevated RA pressures. Would benefit from a LHC to evaluate his coronary anatomy which would push him over the edge to needing dialysis. Per palliative care consult yesterday, patient is unsure if he would like to initiate dialysis. He is tentatively planned for LHC later day pending ongoing Ramireno discussion. - Await further clarification of patient wishes - if HD to be pursued, will undergo LHC later today. If patient declines HD, will focus on medical management, though options  are limited by CKD stage V and soft blood pressures.  - Continue heparin gtt for now - Continue aspirin and statin  2. Acute combined CHF: EF 35-40% on echo this admission. He describes progressive DOE and fatigue recently. Suspect underlying CAD given drop in EF, wall motion abnormalities, coronary artery calcifications on CT, and risk factors (HLD and DM type 2). GDMT limited by soft  blood pressures and CKD. Nephrology is assisting with diuresis, currently on lasix 120mg  BID. Patient is unsure whether he wants to start HD. Breathing and LE edema seem improved despite marginal UOP. He is able to lay flat this morning - Continue lasix per nephrology - Continue to monitor strict I&Os and daily weights  3. Syncope: suspect due to poor po intake, though with reduced EF and elevated troponins cannot exclude ischemia/ventricular arrhythmia. He has maintained sinus tachycardia this admission without evidence of arrhythmia.  - Await LHC results if patient is agreeable to HD  4. HLD: no recent lipids on file - Will add on lipid panel to today's labs - Continue crestor for now  5. CKD stage V: AV fistula in place, maturing. Nephrology following in anticipation of LHC and likely need to start dialysis this admission - Continue management per nephrology  For questions or updates, please contact [place] Please consult [email or web address] for contact info under Cardiology/STEMI.      Signed, Abigail Butts, PA-C  03/20/2020, 8:19 AM   [phone]

## 2020-03-18 NOTE — Plan of Care (Signed)

## 2020-03-18 NOTE — Progress Notes (Signed)
Admit: 04/02/2020 LOS: 3  52M NSTEMI, CKD5 now with N?V, vol o/l, acidosis, SOB  Subjective:  . Seen by palliative yesterday, notes reviewed . Remains unable to decided about HD this AM, asks good questions . SCr and BUN worsened. Sig acidosis . Have offered HD start today, would need HD Cath . He wants to think things over further . Discussed potential value of starting HD now . Discussed real risk of discomfort / suffering with HD at his age .   10/11 0701 - 10/12 0700 In: 265.1 [I.V.:203.1; IV Piggyback:62] Out: 378 [HYIFO:277]  AJOIN Weights   03/16/20 1553 03/17/20 0437 03/31/2020 0530  Weight: 95.7 kg 67.1 kg 65.8 kg    Scheduled Meds: . aspirin EC  81 mg Oral Daily  . Chlorhexidine Gluconate Cloth  6 each Topical Daily  . feeding supplement (NEPRO CARB STEADY)  237 mL Oral BID BM  . mouth rinse  15 mL Mouth Rinse BID  . midodrine  5 mg Oral TID WC  . pantoprazole  40 mg Oral Daily  . rosuvastatin  20 mg Oral Daily  . sodium chloride flush  3 mL Intravenous Q12H   Continuous Infusions: . sodium chloride    . sodium chloride 10 mL/hr at 03/19/2020 0551  . furosemide 120 mg (03/23/2020 0810)  . heparin 1,250 Units/hr (03/22/2020 1049)   PRN Meds:.sodium chloride, acetaminophen **OR** acetaminophen, morphine injection, ondansetron **OR** ondansetron (ZOFRAN) IV, sodium chloride flush, traMADol  Current Labs: reviewed    Physical Exam:  Blood pressure 101/87, pulse (!) 109, temperature (!) 97.5 F (36.4 C), temperature source Oral, resp. rate (!) 23, height 5\' 11"  (1.803 m), weight 65.8 kg, SpO2 98 %. Thin, conversant, Tachypneic Tachy regular No sig LEE Nonfocal R RC AVF, thready Thrill  A 1. CKD5, now with uremia, N/V, vol o/l 2. NSTEMI, AoC sCHF; sig RWMA on TEE, Cards hopes for LHC 3. SOB / tachypneic from #2 and #4 4. Metabolic Acidosis prob from #1 5. OA 6. BPH 7. Recent R RC AVF placed 9/20. Not mature  P . Pt struggling with difficult decision re HD,  not sure he would do well but is possible if started.  He wants to discuss further with friend [name]. He knows I'm available to come  Back by and discuss today . He knows I believe we need to make a choice today given progressive renal failure . Palliative following . Cont Lasix 120 BID . Trial of low dose NaHCO3 650 BID . Daily weights, Daily Renal Panel, Strict I/Os, Avoid nephrotoxins (NSAIDs, judicious IV Contrast)   Pearson Grippe MD 03/13/2020, 12:33 PM  Recent Labs  Lab 03/16/20 0521 03/17/20 0344 03/20/2020 0420  NA 135 134* 134*  K 4.4 4.5 4.6  CL 107 105 104  CO2 15* 13* 13*  GLUCOSE 129* 201* 220*  BUN 82* 87* 104*  CREATININE 5.62* 5.72* 6.35*  CALCIUM 8.5* 8.3* 8.4*   Recent Labs  Lab 03/12/2020 1529 03/15/20 1529 03/16/20 0521 03/17/20 0344 04/06/2020 0420  WBC 7.4   < > 7.8 10.8* 7.8  NEUTROABS 6.3  --   --   --   --   HGB 10.6*   < > 9.9* 9.6* 9.8*  HCT 35.2*   < > 33.0* 31.6* 32.3*  MCV 104.5*   < > 103.4* 101.0* 102.5*  PLT 171   < > 127* PLATELET CLUMPS NOTED ON SMEAR, UNABLE TO ESTIMATE PLATELET CLUMPS NOTED ON SMEAR, UNABLE TO ESTIMATE   < > =  values in this interval not displayed.

## 2020-03-18 NOTE — H&P (Addendum)
   Records have been extensively reviewed.  Nephrology, palliative care, and cardiology notes have been reviewed.  Discussed the patient's case with Dr. Berneice Gandy.  Patient continues to have chest pain with some atypical features.  The decision has been made to perform coronary angiography to identify the extent of coronary disease which may help make a decision about whether to have dialysis and or palliative care.  He is still not agreed to dialysis.  Will plan diagnostic catheterization, using as little contrast as possible.  May need to discuss with Dr. Oval Linsey depending upon findings.  He is relatively frail and very high risk for procedure related complications and poor short-term prognosis.

## 2020-03-18 NOTE — Interval H&P Note (Signed)
Cath Lab Visit (complete for each Cath Lab visit)  Clinical Evaluation Leading to the Procedure:   ACS: Yes.    Non-ACS:    Anginal Classification: CCS IV  Anti-ischemic medical therapy: No Therapy  Non-Invasive Test Results: No non-invasive testing performed  Prior CABG: No previous CABG      History and Physical Interval Note:  03/11/2020 12:58 PM  Richard Moses  has presented today for surgery, with the diagnosis of nstemi.  The various methods of treatment have been discussed with the patient and family. After consideration of risks, benefits and other options for treatment, the patient has consented to  Procedure(s): LEFT HEART CATH AND CORONARY ANGIOGRAPHY (N/A) as a surgical intervention.  The patient's history has been reviewed, patient examined, no change in status, stable for surgery.  I have reviewed the patient's chart and labs.  Questions were answered to the patient's satisfaction.     Belva Crome III

## 2020-03-18 NOTE — Progress Notes (Signed)
SLP Cancellation Note  Patient Details Name: Richard Moses MRN: [id number] DOB: Apr 01, 1933   Cancelled treatment:       Reason Eval/Treat Not Completed: Other (comment) (Patient NPO for cardiac cath procedure) Will attempt next date.   Sonia Baller, MA, CCC-SLP Speech Therapy [facility] Pager: [phone]

## 2020-03-18 NOTE — Progress Notes (Signed)
Pt decided upon LHC and willing to initiate HD.  He went to cath lab and sig orthopnea, unable to proceed with LHC.   Pt ok with starting HD.  Will start once HD cath placed, IR consulted. Might req to start with temp line, might req femoral given orthopnea.  Inc lasix to 160 q6h, add metolazone to try to optimize vol status. HD #1: 3:, 300/500, 2.5h, 3L UF, no heparin first tx.

## 2020-03-18 NOTE — Progress Notes (Signed)
Physical Therapy Treatment Patient Details Name: Richard Moses MRN: [id number] DOB: 04-Feb-1933 Today's Date: 04/06/2020    History of Present Illness 86yo male presenting with recent recurrent syncopal events, pleuritic chest pain, and increasing SOB/DOE. Also with recent unintentional weight loss. CTH and CXR clear, per imaging report also low risk for PE. High sensitivity troponins found to be >7000. Admitted wtih concerns of elevated troponins and potential NSTEMI. PMH knee pain, HLD, permanent foley cath, HTN, DM, CKD, CA    PT Comments    Pt scheduled to have HD fistula placed but brought back to floor due to SoB. Pt states he is feeling better and is agreeable to come to EoB. RN request not to move pt to chair as he will probably be going back to surgery for fistula placement. Pt able to come to EoB with mod Ax2. While sitting EoB able to perform limited exercise with no SoB but increased fatigue. Pt requires modAx2 to return to bed and immediately falls asleep. D/c plans remain appropriate. PT will continue to follow acutely.    Follow Up Recommendations  SNF;Supervision/Assistance - 24 hour;Other (comment) (versus home with hospice)     Equipment Recommendations  Rolling walker with 5" wheels;3in1 (PT)       Precautions / Restrictions Precautions Precautions: Fall Precaution Comments: watch HR/O2/BP    Mobility  Bed Mobility Overal bed mobility: Needs Assistance Bed Mobility: Supine to Sit;Sit to Supine     Supine to sit: Mod assist;+2 for physical assistance Sit to supine: Mod assist;+2 for physical assistance   General bed mobility comments: pt able to initiate LE off bed ultimately requires increased cuing for reaching to bedrail and modAx2 to come to EoB. Pt requires modAx2 for management of LE into bed and trunk to center of bed   Transfers                 General transfer comment: too fatigued to attempt, and scheduled for surgery later           Balance Overall balance assessment: Needs assistance   Sitting balance-Leahy Scale: Fair Sitting balance - Comments: sat EoB for 5 minutes and able to perform limited exercises                                    Cognition Arousal/Alertness: Awake/alert Behavior During Therapy: WFL for tasks assessed/performed Overall Cognitive Status: No family/caregiver present to determine baseline cognitive functioning                                 General Comments: most likely close to baseline; appropriate for functional tasks      Exercises General Exercises - Lower Extremity Long Arc Quad: AROM;Both;Seated (5 R and 6L ) Hip Flexion/Marching: AROM;Both;Seated (4 L and 5 R ) Other Exercises Other Exercises: scapular retraction x 2    General Comments General comments (skin integrity, edema, etc.): Pt is on 2.5L of O2 via [place] SaO2 >96%O2, HR max noted 116bpm. BP 100/63 with return to bed      Pertinent Vitals/Pain Pain Assessment: Faces Faces Pain Scale: Hurts little more Pain Location: generalized discomfort Pain Descriptors / Indicators: Discomfort Pain Intervention(s): Limited activity within patient's tolerance;Monitored during session;Repositioned           PT Goals (current goals can now be found in the care plan section)  Acute Rehab PT Goals Patient Stated Goal: to make a decision regarding dialysis PT Goal Formulation: With patient Time For Goal Achievement: 03/31/20 Potential to Achieve Goals: Fair    Frequency    Min 3X/week      PT Plan Current plan remains appropriate    M-PAC PT "6 Clicks" Mobility   Outcome Measure  Help needed turning from your back to your side while in a flat bed without using bedrails?: A Lot Help needed moving from lying on your back to sitting on the side of a flat bed without using bedrails?: A Lot Help needed moving to and from a bed to a chair (including a wheelchair)?: A Lot Help needed standing up  from a chair using your arms (e.g., wheelchair or bedside chair)?: A Lot Help needed to walk in hospital room?: Total Help needed climbing 3-5 steps with a railing? : Total 6 Click Score: 10    End of Session Equipment Utilized During Treatment: Oxygen Activity Tolerance: Patient limited by fatigue;Treatment limited secondary to medical complications (Comment) (elevated HR) Patient left: in bed;with call bell/phone within reach;Other (comment) (with [place] tech present and attending) Nurse Communication: Mobility status PT Visit Diagnosis: Muscle weakness (generalized) (M62.81);Difficulty in walking, not elsewhere classified (R26.2);Unsteadiness on feet (R26.81)     Time: 8250-5397 PT Time Calculation (min) (ACUTE ONLY): 17 min  Charges:  $Therapeutic Activity: 8-22 mins                     Victorya Hillman B. Migdalia Dk PT, DPT Acute Rehabilitation Services Pager [phone] Office [phone]    [place] 03/23/2020, 4:50 PM

## 2020-03-18 NOTE — Progress Notes (Signed)
ANTICOAGULATION CONSULT NOTE   Pharmacy Consult for heparin Indication: NSTEMI  No Known Allergies  Patient Measurements: Height: 5\' 11"  (180.3 cm) Weight: 67.1 kg (147 lb 14.9 oz) IBW/kg (Calculated) : 75.3 Heparin Dosing Weight: 63.5 kg  Vital Signs: Temp: 98.1 F (36.7 C) (10/11 2315) Temp Source: Oral (10/11 2315) BP: 97/69 (10/11 2315) Pulse Rate: 112 (10/11 2315)  Labs: Recent Labs     0000 04/02/2020 1529 03/20/2020 1729 03/16/20 0130 03/16/20 0521 03/16/20 0521 03/16/20 0919 03/17/20 0344 03/11/2020 0420  HGB   < > 10.6*  --   --  9.9*   < >  --  9.6* 9.8*  HCT   < > 35.2*  --   --  33.0*  --   --  31.6* 32.3*  PLT   < > 171  --   --  127*  --   --  PLATELET CLUMPS NOTED ON SMEAR, UNABLE TO ESTIMATE PLATELET CLUMPS NOTED ON SMEAR, UNABLE TO ESTIMATE  LABPROT  --   --   --   --  18.7*  --   --   --   --   INR  --   --   --   --  1.6*  --   --   --   --   HEPARINUNFRC  --   --   --    < >  --   --  0.63 0.40 0.22*  CREATININE   < > 6.27*  --   --  5.62*  --   --  5.72* 6.35*  TROPONINIHS  --  7,391* 7,160*  --   --   --   --   --   --    < > = values in this interval not displayed.    Estimated Creatinine Clearance: 8.1 mL/min (A) (by C-G formula based on SCr of 6.35 mg/dL (H)).  Assessment: 86 y.o. male with NSTEMI for heparin  Goal of Therapy:  Heparin level 0.3-0.7 units/ml Monitor platelets by anticoagulation protocol: Yes   Plan:  Increase Heparin  1250 units/hr  Phillis Knack, PharmD, BCPS  03/13/2020

## 2020-03-18 NOTE — H&P (View-Only) (Signed)
Admit: 03/27/2020 LOS: 3  84M NSTEMI, CKD5 now with N?V, vol o/l, acidosis, SOB  Subjective:  . Seen by palliative yesterday, notes reviewed . Remains unable to decided about HD this AM, asks good questions . SCr and BUN worsened. Sig acidosis . Have offered HD start today, would need HD Cath . He wants to think things over further . Discussed potential value of starting HD now . Discussed real risk of discomfort / suffering with HD at his age .   10/11 0701 - 10/12 0700 In: 265.1 [I.V.:203.1; IV Piggyback:62] Out: 462 [VOJJK:093]  GHWEX Weights   03/16/20 1553 03/17/20 0437 03/20/2020 0530  Weight: 95.7 kg 67.1 kg 65.8 kg    Scheduled Meds: . aspirin EC  81 mg Oral Daily  . Chlorhexidine Gluconate Cloth  6 each Topical Daily  . feeding supplement (NEPRO CARB STEADY)  237 mL Oral BID BM  . mouth rinse  15 mL Mouth Rinse BID  . midodrine  5 mg Oral TID WC  . pantoprazole  40 mg Oral Daily  . rosuvastatin  20 mg Oral Daily  . sodium chloride flush  3 mL Intravenous Q12H   Continuous Infusions: . sodium chloride    . sodium chloride 10 mL/hr at 04/05/2020 0551  . furosemide 120 mg (04/03/2020 0810)  . heparin 1,250 Units/hr (03/25/2020 1049)   PRN Meds:.sodium chloride, acetaminophen **OR** acetaminophen, morphine injection, ondansetron **OR** ondansetron (ZOFRAN) IV, sodium chloride flush, traMADol  Current Labs: reviewed    Physical Exam:  Blood pressure 101/87, pulse (!) 109, temperature (!) 97.5 F (36.4 C), temperature source Oral, resp. rate (!) 23, height 5\' 11"  (1.803 m), weight 65.8 kg, SpO2 98 %. Thin, conversant, Tachypneic Tachy regular No sig LEE Nonfocal R RC AVF, thready Thrill  A 1. CKD5, now with uremia, N/V, vol o/l 2. NSTEMI, AoC sCHF; sig RWMA on TEE, Cards hopes for LHC 3. SOB / tachypneic from #2 and #4 4. Metabolic Acidosis prob from #1 5. OA 6. BPH 7. Recent R RC AVF placed 9/20. Not mature  P . Pt struggling with difficult decision re HD,  not sure he would do well but is possible if started.  He wants to discuss further with friend [name]. He knows I'm available to come  Back by and discuss today . He knows I believe we need to make a choice today given progressive renal failure . Palliative following . Cont Lasix 120 BID . Trial of low dose NaHCO3 650 BID . Daily weights, Daily Renal Panel, Strict I/Os, Avoid nephrotoxins (NSAIDs, judicious IV Contrast)   Pearson Grippe MD 03/08/2020, 12:33 PM  Recent Labs  Lab 03/16/20 0521 03/17/20 0344 03/07/2020 0420  NA 135 134* 134*  K 4.4 4.5 4.6  CL 107 105 104  CO2 15* 13* 13*  GLUCOSE 129* 201* 220*  BUN 82* 87* 104*  CREATININE 5.62* 5.72* 6.35*  CALCIUM 8.5* 8.3* 8.4*   Recent Labs  Lab 03/12/2020 1529 03/09/2020 1529 03/16/20 0521 03/17/20 0344 04/06/2020 0420  WBC 7.4   < > 7.8 10.8* 7.8  NEUTROABS 6.3  --   --   --   --   HGB 10.6*   < > 9.9* 9.6* 9.8*  HCT 35.2*   < > 33.0* 31.6* 32.3*  MCV 104.5*   < > 103.4* 101.0* 102.5*  PLT 171   < > 127* PLATELET CLUMPS NOTED ON SMEAR, UNABLE TO ESTIMATE PLATELET CLUMPS NOTED ON SMEAR, UNABLE TO ESTIMATE   < > =  values in this interval not displayed.

## 2020-03-18 NOTE — Progress Notes (Signed)
PROGRESS NOTE    Richard Moses  VQQ:[id number] DOB: Feb 07, 1933 DOA: 03/08/2020 PCP: Carollee Leitz, MD    Brief Narrative:  86 year old gentleman with history of stage V chronic kidney disease, Foley catheter dependent BPH, diet-controlled diabetes, recent left forearm AV fistula in preparation for dialysis presented to the ER with increasing shortness of breath on exertion and at rest, passing out spell while walking in the mall, poor intake and unexplained weight loss for last 6 months.  Patient had another episode of passing out while walking to the car. In the emergency room blood pressure was 98/59.  Oxygenation 96% on room air.  VQ scan normal.  Initial troponin 7000.  BNP 4000.  Creatinine 6.   Assessment & Plan:   Principal Problem:   Elevated troponin Active Problems:   Primary hypertension   Chronic kidney disease, stage V (HCC)   Weakness generalized   DNR (do not resuscitate) discussion   Anemia associated with stage 5 chronic renal failure (HCC)   Peripheral neuropathy   Secondary hyperparathyroidism (HCC)   Diarrhea   Peripheral artery disease (HCC)   Edema   Right-sided Bell's palsy   Syncope and collapse   Chest pain   Pleural effusion on right   Right AVF (arteriovenous fistula) (HCC)   NSTEMI (non-ST elevated myocardial infarction) ([place])   Cardiogenic shock ([place])   Palliative care by specialist  Syncope: No neurological event.  Suspect orthostasis. Monitor shows sinus tachycardia with no arrhythmias.  Found to have greatly elevated troponins with pleuritic chest pain.  Cardiology following.  Non-STEMI /chest pain/elevated troponins: Echocardiogram with 35 to 40% ejection fraction, grade 2 diastolic dysfunction.  Wall motion abnormalities. Currently on aspirin, statin and on heparin infusion. Followed by cardiology.  Patient undecided about dialysis, hence cardiac cath plan on hold.  CKD stage V: Followed by nephrology.  Probably heading to dialysis.  Patient  is reluctant.  Type 2 diabetes: Diet controlled at home.  Remains fairly stable.  On insulin sliding scale.     DVT prophylaxis: Heparin infusion   Code Status: Full code Family Communication: Son at bedside, 10/11 Disposition Plan: Status is: Inpatient  Remains inpatient appropriate because:Inpatient level of care appropriate due to severity of illness   Dispo: The patient is from: Home              Anticipated d/c is to: Home              Anticipated d/c date is: 3 days              Patient currently is not medically stable to d/c.         Consultants:   Cardiology  Nephrology  Palliative medicine  Procedures:   None  Antimicrobials:   None   Subjective: Patient seen and examined.  No overnight events.  Denies any chest pain or palpitation at rest.  Denies any shortness of breath at rest.  I interviewed him in the morning and he was still undecided about dialysis.  He had genuine questions and was worried about becoming dialysis dependent.  He does not like to be dependent on his Foley catheter.  Objective: Vitals:   03/17/20 1705 03/17/20 2124 03/17/20 2315 03/19/2020 0530  BP: 101/77 94/64 97/69  101/87  Pulse: (!) 110 (!) 109 (!) 112 (!) 109  Resp: 19 (!) 22 20 (!) 23  Temp: 97.8 F (36.6 C) 97.6 F (36.4 C) 98.1 F (36.7 C) (!) 97.5 F (36.4 C)  TempSrc: Oral  Oral Oral Oral  SpO2: 98% 96% 97% 98%  Weight:    65.8 kg  Height:        Intake/Output Summary (Last 24 hours) at 03/25/2020 1302 Last data filed at 03/15/2020 0810 Gross per 24 hour  Intake 505.14 ml  Output 600 ml  Net -94.86 ml   Filed Weights   03/16/20 1553 03/17/20 0437 03/28/2020 0530  Weight: 95.7 kg 67.1 kg 65.8 kg    Examination:  General exam: Frail looking chronically sick gentleman, anxious, not in any distress. Respiratory system: Clear to auscultation.  No added sounds. Cardiovascular system: S1 & S2 heard, RRR. No JVD, murmurs, rubs, gallops or clicks. No pedal  edema. Gastrointestinal system: Abdomen is nondistended, soft and nontender. No organomegaly or masses felt. Normal bowel sounds heard. Indwelling Foley catheter with clear urine. Central nervous system: Alert and oriented. No focal neurological deficits.  Generalized weakness. Extremities: Patient has left forearm AV fistula with thrill. Skin: No rashes, lesions or ulcers Psychiatry: Judgement and insight appear normal. Mood & affect anxious.    Data Reviewed: I have personally reviewed following labs and imaging studies  CBC: Recent Labs  Lab 03/26/2020 1529 03/16/20 0521 03/17/20 0344 03/11/2020 0420  WBC 7.4 7.8 10.8* 7.8  NEUTROABS 6.3  --   --   --   HGB 10.6* 9.9* 9.6* 9.8*  HCT 35.2* 33.0* 31.6* 32.3*  MCV 104.5* 103.4* 101.0* 102.5*  PLT 171 127* PLATELET CLUMPS NOTED ON SMEAR, UNABLE TO ESTIMATE PLATELET CLUMPS NOTED ON SMEAR, UNABLE TO ESTIMATE   Basic Metabolic Panel: Recent Labs  Lab 04/06/2020 1529 03/16/20 0521 03/17/20 0344 03/11/2020 0420  NA 134* 135 134* 134*  K 4.7 4.4 4.5 4.6  CL 103 107 105 104  CO2 17* 15* 13* 13*  GLUCOSE 202* 129* 201* 220*  BUN 80* 82* 87* 104*  CREATININE 6.27* 5.62* 5.72* 6.35*  CALCIUM 8.9 8.5* 8.3* 8.4*   GFR: Estimated Creatinine Clearance: 7.9 mL/min (A) (by C-G formula based on SCr of 6.35 mg/dL (H)). Liver Function Tests: Recent Labs  Lab 03/11/2020 1529 03/16/20 0521  AST 74* 64*  ALT 25 25  ALKPHOS 65 65  BILITOT 0.8 0.7  PROT 7.2 6.4*  ALBUMIN 3.6 3.3*   Recent Labs  Lab 03/10/2020 1529  LIPASE 30   No results for input(s): AMMONIA in the last 168 hours. Coagulation Profile: Recent Labs  Lab 03/16/20 0521  INR 1.6*   Cardiac Enzymes: No results for input(s): CKTOTAL, CKMB, CKMBINDEX, TROPONINI in the last 168 hours. BNP (last 3 results) No results for input(s): PROBNP in the last 8760 hours. HbA1C: No results for input(s): HGBA1C in the last 72 hours. CBG: Recent Labs  Lab 04/04/2020 1514  GLUCAP  170*   Lipid Profile: Recent Labs    03/17/20 1138  CHOL 173  HDL 64  LDLCALC 91  TRIG 91  CHOLHDL 2.7   Thyroid Function Tests: Recent Labs    03/16/20 1307  TSH 5.149*   Anemia Panel: No results for input(s): VITAMINB12, FOLATE, FERRITIN, TIBC, IRON, RETICCTPCT in the last 72 hours. Sepsis Labs: No results for input(s): PROCALCITON, LATICACIDVEN in the last 168 hours.  Recent Results (from the past 240 hour(s))  Respiratory Panel by RT PCR (Flu A&B, Covid) - Nasopharyngeal Swab     Status: None   Collection Time: 03/30/2020  3:29 PM   Specimen: Nasopharyngeal Swab  Result Value Ref Range Status   SARS Coronavirus 2 by RT PCR NEGATIVE NEGATIVE Final  Comment: (NOTE) SARS-CoV-2 target nucleic acids are NOT DETECTED.  The SARS-CoV-2 RNA is generally detectable in upper respiratoy specimens during the acute phase of infection. The lowest concentration of SARS-CoV-2 viral copies this assay can detect is 131 copies/mL. A negative result does not preclude SARS-Cov-2 infection and should not be used as the sole basis for treatment or other patient management decisions. A negative result may occur with  improper specimen collection/handling, submission of specimen other than nasopharyngeal swab, presence of viral mutation(s) within the areas targeted by this assay, and inadequate number of viral copies (<131 copies/mL). A negative result must be combined with clinical observations, patient history, and epidemiological information. The expected result is Negative.  Fact Sheet for Patients:  PinkCheek.be  Fact Sheet for Healthcare Providers:  GravelBags.it  This test is no t yet approved or cleared by the [place] FDA and  has been authorized for detection and/or diagnosis of SARS-CoV-2 by FDA under an Emergency Use Authorization (EUA). This EUA will remain  in effect (meaning this test can be used) for the  duration of the COVID-19 declaration under Section 564(b)(1) of the Act, 21 U.S.C. section 360bbb-3(b)(1), unless the authorization is terminated or revoked sooner.     Influenza A by PCR NEGATIVE NEGATIVE Final   Influenza B by PCR NEGATIVE NEGATIVE Final    Comment: (NOTE) The Xpert Xpress SARS-CoV-2/FLU/RSV assay is intended as an aid in  the diagnosis of influenza from Nasopharyngeal swab specimens and  should not be used as a sole basis for treatment. Nasal washings and  aspirates are unacceptable for Xpert Xpress SARS-CoV-2/FLU/RSV  testing.  Fact Sheet for Patients: PinkCheek.be  Fact Sheet for Healthcare Providers: GravelBags.it  This test is not yet approved or cleared by the [place] FDA and  has been authorized for detection and/or diagnosis of SARS-CoV-2 by  FDA under an Emergency Use Authorization (EUA). This EUA will remain  in effect (meaning this test can be used) for the duration of the  Covid-19 declaration under Section 564(b)(1) of the Act, 21  U.S.C. section 360bbb-3(b)(1), unless the authorization is  terminated or revoked. Performed at [facility], [place] [address] [place], [place] [address]          Radiology Studies: DG Swallowing Func-Speech Pathology  Result Date: 03/17/2020 Objective Swallowing Evaluation: Type of Study: MBS-Modified Barium Swallow Study  Patient Details Name: Richard Moses MRN: [id number] Date of Birth: 05-03-1933 Today's Date: 03/17/2020 Time: SLP Start Time (ACUTE ONLY): 0831 -SLP Stop Time (ACUTE ONLY): 0856 SLP Time Calculation (min) (ACUTE ONLY): 25 min Past Medical History: Past Medical History: Diagnosis Date . Abnormal MRI, cervical spine 09/29/10  Mildly abnormal MRI cervical spine demonstrating mild spondylosis and disc bulging from C4-5 down to C7-T1.  No spinal stenosis or foraminal narrowing . Anemia  . Arthritis  . Blood transfusion  without reported diagnosis  . BPH (benign prostatic hyperplasia)  . Cancer [facility]   possible melanoma on lt.leg . Cataract   beginning stage . Cervical disc herniation 09/29/10  C4-C5 and C7-T1 . Chronic kidney disease (CKD)   stage 4 per dr patel nephrology lov 10-11-2019 . Chronic left-sided headaches  . Colon polyp 2007 . Diabetes mellitus   diet dontrolled . Elevated PSA  . External hemorrhoids without mention of complication 9675 . GERD (gastroesophageal reflux disease)  . History of hypertension   no current meds for . Hydronephrosis   acute renal failure superimposed on stage 4 ckd . Hyperlipidemia  .  Hypothyroidism 09/29/10  Untreated. Patient not interested in Rx.  . Intermittent self-catheterization of bladder   placed every month, foley in all the time . Knee pain, bilateral 02/05/2013  Pain since a fall in 2014 . MRI of brain abnormal 09/29/10  Abnormal MRI of brain, demonstrating mild atrophy . Neck pain on left side 2012 . Neuropathy  . Plantar fasciitis   left foot . Secondary hyperparathyroidism [facility])  Past Surgical History: Past Surgical History: Procedure Laterality Date . AV FISTULA PLACEMENT Left 02/25/2020  Procedure: ARTERIOVENOUS (AV) FISTULA CREATION LEFT;  Surgeon: Elam Dutch, MD;  Location: [facility];  Service: Vascular;  Laterality: Left; . COLONOSCOPY   . CYSTOSCOPY WITH LITHOLAPAXY N/A 11/19/2019  Procedure: CYSTOSCOPY WITH LITHOLAPAXY;  Surgeon: Lucas Mallow, MD;  Location[facility];  Service: Urology;  Laterality: N/A; . left knee melanoma removed  2018 . MELANOMA EXCISION    left side of neck . POLYPECTOMY   . RHINOPLASTY    x2  HPI: Patient is an 86 y.o. male with PMH: CKD stage V, BPH, melanoma, diet controlled DM-2, GERD, cervical disc herniation, MRI cervical spine revealing mild spondylosis and disc bulging from C4-5 down to C7-T1, bell's palsy on right side for many years. He presented to hospital. On 10/6, patient started having worsening SOB on exertion as  well as at rest with fatigue. While walking to the mall on 10/6, he passed out and woke up on the ground. In AM on day of admission, he had episode of nausea and two episodes of vomiting. Patient's son reported that patient passed out while walking to car. CT head negative for intracranial pathology. CXR showed small right pleural effusion.  Subjective: pleasant, had just moved to unit from downstairs Assessment / Plan / Recommendation CHL IP CLINICAL IMPRESSIONS 03/17/2020 Clinical Impression Pt was seen for MBS, which revealed moderate oropharyngeal dysphagia as characterized by delayed oral transit, reduced epiglottic deflection, instances of aspiration, multiple swallows, and significant pharyngeal residue. Pt was lethargic throughout MBS, frequently reporting how tired he felt. Delayed oral tranit occurred across POs. During the swallow, epiglottic deflection was significantly reduced (nearly nonexistent) as the epiglottis stayed in a fixed, cupped position. Multiple swallows were also observed across POs. Additionally, significant residue was observed in the valleculae and pyriforms across POs. Mild palatal residue was observed with a regular solid. SLP attempted various strategies to address residue, including head turn, chin tuck, liquid wash, volitional cough/swallow, and effortful swallow. Majority of strategies were ineffective, and instances of trace aspiration of thin liquid were observed during the swallow while attempting a head turn. Use of effortful swallow yielded the best results in reducing residue. Recommend dys 3 diet and thin liquid with use of effortful swallow. Crush meds in puree. SLP will f/u acutely to reinforce/educate on use of effortful swallow and check diet tolerance.  SLP Visit Diagnosis Dysphagia, oropharyngeal phase (R13.12) Attention and concentration deficit following -- Frontal lobe and executive function deficit following -- Impact on safety and function Moderate aspiration  risk   CHL IP TREATMENT RECOMMENDATION 03/17/2020 Treatment Recommendations Therapy as outlined in treatment plan below   Prognosis 03/17/2020 Prognosis for Safe Diet Advancement Fair Barriers to Reach Goals -- Barriers/Prognosis Comment -- CHL IP DIET RECOMMENDATION 03/17/2020 SLP Diet Recommendations Dysphagia 3 (Mech soft) solids;Thin liquid Liquid Administration via Straw;Cup Medication Administration Crushed with puree Compensations Minimize environmental distractions;Slow rate;Small sips/bites;Effortful swallow Postural Changes --   CHL IP OTHER RECOMMENDATIONS 03/17/2020 Recommended Consults -- Oral Care  Recommendations Oral care BID Other Recommendations --   CHL IP FOLLOW UP RECOMMENDATIONS 03/16/2020 Follow up Recommendations Other (comment)   CHL IP FREQUENCY AND DURATION 03/17/2020 Speech Therapy Frequency (ACUTE ONLY) min 2x/week Treatment Duration 2 weeks      CHL IP ORAL PHASE 03/17/2020 Oral Phase Impaired Oral - Pudding Teaspoon -- Oral - Pudding Cup -- Oral - Honey Teaspoon -- Oral - Honey Cup -- Oral - Nectar Teaspoon -- Oral - Nectar Cup -- Oral - Nectar Straw Delayed oral transit Oral - Thin Teaspoon -- Oral - Thin Cup Delayed oral transit Oral - Thin Straw Delayed oral transit Oral - Puree Delayed oral transit Oral - Mech Soft -- Oral - Regular Delayed oral transit;Lingual/palatal residue Oral - Multi-Consistency -- Oral - Pill -- Oral Phase - Comment --  CHL IP PHARYNGEAL PHASE 03/17/2020 Pharyngeal Phase Impaired Pharyngeal- Pudding Teaspoon -- Pharyngeal -- Pharyngeal- Pudding Cup -- Pharyngeal -- Pharyngeal- Honey Teaspoon -- Pharyngeal -- Pharyngeal- Honey Cup -- Pharyngeal -- Pharyngeal- Nectar Teaspoon -- Pharyngeal -- Pharyngeal- Nectar Cup -- Pharyngeal -- Pharyngeal- Nectar Straw Reduced epiglottic inversion;Pharyngeal residue - valleculae;Pharyngeal residue - pyriform;Compensatory strategies attempted (with notebox) Pharyngeal -- Pharyngeal- Thin Teaspoon -- Pharyngeal -- Pharyngeal-  Thin Cup Compensatory strategies attempted (with notebox);Pharyngeal residue - pyriform;Pharyngeal residue - valleculae;Reduced epiglottic inversion Pharyngeal -- Pharyngeal- Thin Straw Compensatory strategies attempted (with notebox);Pharyngeal residue - pyriform;Pharyngeal residue - valleculae;Reduced epiglottic inversion;Trace aspiration Pharyngeal -- Pharyngeal- Puree Pharyngeal residue - valleculae;Pharyngeal residue - pyriform;Reduced epiglottic inversion Pharyngeal -- Pharyngeal- Mechanical Soft -- Pharyngeal -- Pharyngeal- Regular Pharyngeal residue - pyriform;Pharyngeal residue - valleculae;Reduced epiglottic inversion Pharyngeal -- Pharyngeal- Multi-consistency -- Pharyngeal -- Pharyngeal- Pill -- Pharyngeal -- Pharyngeal Comment --  CHL IP CERVICAL ESOPHAGEAL PHASE 11/15/2016 Cervical Esophageal Phase WFL Pudding Teaspoon -- Pudding Cup -- Honey Teaspoon -- Honey Cup -- Nectar Teaspoon -- Nectar Cup -- Nectar Straw -- Thin Teaspoon -- Thin Cup -- Thin Straw -- Puree -- Mechanical Soft -- Regular -- Multi-consistency -- Pill -- Cervical Esophageal Comment -- Herbie Baltimore, MA CCC-SLP Acute Rehabilitation Services Pager [phone] Office [phone] Lynann Beaver 03/17/2020, 10:13 AM              VAS [place] LOWER EXTREMITY VENOUS (DVT)  Result Date: 03/17/2020  Lower Venous DVTStudy Indications: Edema.  Risk Factors: CKD. Anticoagulation: Heparin. Performing Technologist: Griffin Basil RCT RDMS  Examination Guidelines: A complete evaluation includes B-mode imaging, spectral Doppler, color Doppler, and power Doppler as needed of all accessible portions of each vessel. Bilateral testing is considered an integral part of a complete examination. Limited examinations for reoccurring indications may be performed as noted. The reflux portion of the exam is performed with the patient in reverse Trendelenburg.  +---------+---------------+---------+-----------+----------+--------------+ RIGHT     CompressibilityPhasicitySpontaneityPropertiesThrombus Aging +---------+---------------+---------+-----------+----------+--------------+ CFV      Full           Yes      Yes                                 +---------+---------------+---------+-----------+----------+--------------+ SFJ      Full                                                        +---------+---------------+---------+-----------+----------+--------------+ FV Prox  Full                                                        +---------+---------------+---------+-----------+----------+--------------+  FV Mid   Full                                                        +---------+---------------+---------+-----------+----------+--------------+ FV DistalFull                                                        +---------+---------------+---------+-----------+----------+--------------+ PFV      Full                                                        +---------+---------------+---------+-----------+----------+--------------+ POP      Full           Yes      Yes                                 +---------+---------------+---------+-----------+----------+--------------+ PTV      Full                                                        +---------+---------------+---------+-----------+----------+--------------+ PERO     Full                                                        +---------+---------------+---------+-----------+----------+--------------+   +---------+---------------+---------+-----------+----------+--------------+ LEFT     CompressibilityPhasicitySpontaneityPropertiesThrombus Aging +---------+---------------+---------+-----------+----------+--------------+ CFV      Full           Yes      Yes                                 +---------+---------------+---------+-----------+----------+--------------+ SFJ      Full                                                         +---------+---------------+---------+-----------+----------+--------------+ FV Prox  Full                                                        +---------+---------------+---------+-----------+----------+--------------+ FV Mid   Full                                                        +---------+---------------+---------+-----------+----------+--------------+  FV DistalFull                                                        +---------+---------------+---------+-----------+----------+--------------+ PFV      Full                                                        +---------+---------------+---------+-----------+----------+--------------+ POP      Full           Yes      Yes                                 +---------+---------------+---------+-----------+----------+--------------+ PTV      Full                                                        +---------+---------------+---------+-----------+----------+--------------+ PERO     Full                                                        +---------+---------------+---------+-----------+----------+--------------+     Summary: RIGHT: - There is no evidence of deep vein thrombosis in the lower extremity.  - No cystic structure found in the popliteal fossa.  LEFT: - There is no evidence of deep vein thrombosis in the lower extremity.  - No cystic structure found in the popliteal fossa.  *See table(s) above for measurements and observations. Electronically signed by Monica Martinez MD on 03/17/2020 at 5:29:28 PM.    Final         Scheduled Meds: . aspirin EC  81 mg Oral Daily  . Chlorhexidine Gluconate Cloth  6 each Topical Daily  . feeding supplement (NEPRO CARB STEADY)  237 mL Oral BID BM  . mouth rinse  15 mL Mouth Rinse BID  . midodrine  5 mg Oral TID WC  . pantoprazole  40 mg Oral Daily  . rosuvastatin  20 mg Oral Daily  . sodium bicarbonate  650 mg Oral BID  . sodium  chloride flush  3 mL Intravenous Q12H   Continuous Infusions: . sodium chloride    . sodium chloride 10 mL/hr at 04/06/2020 0551  . furosemide 120 mg (03/28/2020 0810)  . heparin Stopped (03/25/2020 1215)     LOS: 3 days    Time spent: 35 minutes    Barb Merino, MD [facility] Pager [phone]

## 2020-03-18 NOTE — Progress Notes (Signed)
Spoke to floor nurse Yoko, RN states pt without a HD cath; therefore, unsure of HD plans for tonight. TC to on call Nephrologist Dr. Moshe Cipro- who contacted Dr. Joelyn Oms patient without HD access modify HD orders for tomorrow 03/19/20 after which pt may have HD cath for Tx 03/19/20

## 2020-03-19 ENCOUNTER — Inpatient Hospital Stay (HOSPITAL_COMMUNITY): Payer: Medicare HMO

## 2020-03-19 ENCOUNTER — Encounter (HOSPITAL_COMMUNITY): Payer: Self-pay | Admitting: Interventional Cardiology

## 2020-03-19 ENCOUNTER — Other Ambulatory Visit: Payer: Self-pay

## 2020-03-19 DIAGNOSIS — I5041 Acute combined systolic (congestive) and diastolic (congestive) heart failure: Secondary | ICD-10-CM | POA: Diagnosis not present

## 2020-03-19 DIAGNOSIS — R570 Cardiogenic shock: Secondary | ICD-10-CM | POA: Diagnosis not present

## 2020-03-19 DIAGNOSIS — R778 Other specified abnormalities of plasma proteins: Secondary | ICD-10-CM | POA: Diagnosis not present

## 2020-03-19 DIAGNOSIS — I200 Unstable angina: Secondary | ICD-10-CM

## 2020-03-19 HISTORY — PX: IR US GUIDE VASC ACCESS RIGHT: IMG2390

## 2020-03-19 HISTORY — PX: IR FLUORO GUIDE CV LINE RIGHT: IMG2283

## 2020-03-19 LAB — RENAL FUNCTION PANEL
Albumin: 2.6 g/dL — ABNORMAL LOW (ref 3.5–5.0)
Anion gap: 18 — ABNORMAL HIGH (ref 5–15)
BUN: 120 mg/dL — ABNORMAL HIGH (ref 8–23)
CO2: 12 mmol/L — ABNORMAL LOW (ref 22–32)
Calcium: 8.2 mg/dL — ABNORMAL LOW (ref 8.9–10.3)
Chloride: 101 mmol/L (ref 98–111)
Creatinine, Ser: 6.53 mg/dL — ABNORMAL HIGH (ref 0.61–1.24)
GFR, Estimated: 7 mL/min — ABNORMAL LOW (ref >60)
Glucose, Bld: 244 mg/dL — ABNORMAL HIGH (ref 70–99)
Phosphorus: 6.2 mg/dL — ABNORMAL HIGH (ref 2.5–4.6)
Potassium: 4.5 mmol/L (ref 3.5–5.1)
Sodium: 131 mmol/L — ABNORMAL LOW (ref 135–145)

## 2020-03-19 LAB — CBC
HCT: 31.3 % — ABNORMAL LOW (ref 39.0–52.0)
Hemoglobin: 9.7 g/dL — ABNORMAL LOW (ref 13.0–17.0)
MCH: 31 pg (ref 26.0–34.0)
MCHC: 31 g/dL (ref 30.0–36.0)
MCV: 100 fL (ref 80.0–100.0)
Platelets: UNDETERMINED 10*3/uL (ref 150–400)
RBC: 3.13 MIL/uL — ABNORMAL LOW (ref 4.22–5.81)
RDW: 15.2 % (ref 11.5–15.5)
WBC: 8.4 10*3/uL (ref 4.0–10.5)
nRBC: 0 % (ref 0.0–0.2)

## 2020-03-19 LAB — HEPATITIS B SURFACE ANTIGEN: Hepatitis B Surface Ag: NONREACTIVE

## 2020-03-19 LAB — SEDIMENTATION RATE: Sed Rate: 82 mm/hr — ABNORMAL HIGH (ref 0–16)

## 2020-03-19 LAB — HEPARIN LEVEL (UNFRACTIONATED): Heparin Unfractionated: 0.38 IU/mL (ref 0.30–0.70)

## 2020-03-19 MED ORDER — CEFAZOLIN SODIUM-DEXTROSE 2-4 GM/100ML-% IV SOLN
INTRAVENOUS | Status: AC
  Administered 2020-03-19: 2 g via INTRAVENOUS
  Filled 2020-03-19: qty 100

## 2020-03-19 MED ORDER — FENTANYL CITRATE (PF) 100 MCG/2ML IJ SOLN
INTRAMUSCULAR | Status: AC | PRN
Start: 2020-03-19 — End: 2020-03-19
  Administered 2020-03-19: 25 ug via INTRAVENOUS

## 2020-03-19 MED ORDER — AMIODARONE HCL IN DEXTROSE 360-4.14 MG/200ML-% IV SOLN
60.0000 mg/h | INTRAVENOUS | Status: DC
  Filled 2020-03-19: qty 200

## 2020-03-19 MED ORDER — LIDOCAINE HCL (PF) 1 % IJ SOLN
INTRAMUSCULAR | Status: AC | PRN
  Administered 2020-03-19: 5 mL

## 2020-03-19 MED ORDER — CEFAZOLIN SODIUM-DEXTROSE 2-4 GM/100ML-% IV SOLN: 2.0000 g | Freq: Once | INTRAVENOUS | Status: AC

## 2020-03-19 MED ORDER — HEPARIN SODIUM (PORCINE) 1000 UNIT/ML IJ SOLN
INTRAMUSCULAR | Status: AC
  Filled 2020-03-19: qty 1

## 2020-03-19 MED ORDER — MIDAZOLAM HCL 2 MG/2ML IJ SOLN
INTRAMUSCULAR | Status: AC
  Filled 2020-03-19: qty 2

## 2020-03-19 MED ORDER — FENTANYL CITRATE (PF) 100 MCG/2ML IJ SOLN
INTRAMUSCULAR | Status: AC
  Filled 2020-03-19: qty 2

## 2020-03-19 MED ORDER — MIDAZOLAM HCL 2 MG/2ML IJ SOLN
INTRAMUSCULAR | Status: AC | PRN
  Administered 2020-03-19: 0.5 mg via INTRAVENOUS

## 2020-03-19 MED ORDER — COLCHICINE 0.6 MG PO TABS
0.6000 mg | ORAL_TABLET | Freq: Once | ORAL | Status: AC
  Administered 2020-03-19: 0.6 mg via ORAL
  Filled 2020-03-19: qty 1

## 2020-03-19 MED ORDER — AMIODARONE LOAD VIA INFUSION
150.0000 mg | Freq: Once | INTRAVENOUS | Status: AC
  Administered 2020-03-20: 150 mg via INTRAVENOUS
  Filled 2020-03-19: qty 83.34

## 2020-03-19 MED ORDER — HEPARIN SODIUM (PORCINE) 1000 UNIT/ML IJ SOLN
INTRAMUSCULAR | Status: AC
  Administered 2020-03-19: 3800 [IU]
  Filled 2020-03-19: qty 4

## 2020-03-19 MED ORDER — LIDOCAINE HCL 1 % IJ SOLN
INTRAMUSCULAR | Status: AC
  Filled 2020-03-19: qty 20

## 2020-03-19 MED ORDER — AMIODARONE HCL IN DEXTROSE 360-4.14 MG/200ML-% IV SOLN: 30.0000 mg/h | INTRAVENOUS | Status: DC

## 2020-03-19 NOTE — Progress Notes (Signed)
Patient returned from first hemodialysis session in atrial fibrillation with rapid ventricular response.  EKG completed to confirm.  Heart rate ranging 120's-140's, blood pressure 92/64.  RN asked patient how patient was feeling and patient replied I feel pretty good.  [facility] paged via [place].

## 2020-03-19 NOTE — Progress Notes (Signed)
PROGRESS NOTE    Richard Moses  DJM:[id number] DOB: 03-29-1933 DOA: 03/26/2020 PCP: Carollee Leitz, MD    Brief Narrative:  86 year old gentleman with history of stage V chronic kidney disease, Foley catheter dependent BPH, diet-controlled diabetes, recent left forearm AV fistula in preparation for dialysis presented to the ER with increasing shortness of breath on exertion and at rest, passing out spell while [place] the [place], poor intake and unexplained weight loss for last 6 months.  Patient had another episode of passing out while walking to the car. In the emergency room blood pressure was 98/59.  Oxygenation 96% on room air.  VQ scan normal.  Initial troponin 7000.  BNP 4000.  Creatinine 6.   Assessment & Plan:   Principal Problem:   Elevated troponin Active Problems:   Primary hypertension   Chronic kidney disease, stage V (HCC)   Weakness generalized   DNR (do not resuscitate) discussion   Anemia associated with stage 5 chronic renal failure (HCC)   Peripheral neuropathy   Secondary hyperparathyroidism (HCC)   Diarrhea   Peripheral artery disease (HCC)   Edema   Right-sided Bell's palsy   Syncope and collapse   Chest pain   Pleural effusion on right   Right AVF (arteriovenous fistula) (HCC)   NSTEMI (non-ST elevated myocardial infarction) ([place])   Cardiogenic shock ([place])   Palliative care by specialist  Syncope: No neurological event.  Suspect orthostasis. Monitor shows sinus tachycardia with no arrhythmias.  Found to have greatly elevated troponins with pleuritic chest pain.  Cardiology following. Attempted to cardiac cath x2, patient could not lay flat.  Monitoring progress after dialysis today.  Non-STEMI /chest pain/elevated troponins: Echocardiogram with 35 to 40% ejection fraction, grade 2 diastolic dysfunction.  Wall motion abnormalities. Currently on aspirin, statin and on heparin infusion. Followed by cardiology.  Attempted cardiac cath x2, patient could  not lay flat. Attempting after dialysis.  CKD stage V: Followed by nephrology. Initially reluctant for dialysis. Now agreeable. Permacath placement today and dialysis.  Type 2 diabetes: Diet controlled at home.  Remains fairly stable.  On insulin sliding scale.  Urinary retention: Chronic. Catheter dependent. Foley catheter exchanged 10/12.     DVT prophylaxis: Heparin infusion   Code Status: Full code Family Communication: Son at bedside, 10/11 Disposition Plan: Status is: Inpatient  Remains inpatient appropriate because:Inpatient level of care appropriate due to severity of illness   Dispo: The patient is from: Home              Anticipated d/c is to: Home              Anticipated d/c date is: 3 days              Patient currently is not medically stable to d/c.         Consultants:   Cardiology  Nephrology  Palliative medicine  Procedures:   None  Antimicrobials:   None   Subjective: Seen and examined. No overnight events. Could not get HD catheter yesterday. He does want to get dialysis, not sure how much that is going to help him. Still has some anterior chest pain but unlike before.  Objective: Vitals:   03/19/20 0725 03/19/20 0732 03/19/20 0833 03/19/20 1233  BP: 101/69 101/70 101/75 102/69  Pulse: (!) 109 (!) 108  (!) 106  Resp: 19 20  20   Temp: 98.5 F (36.9 C)   98.1 F (36.7 C)  TempSrc: Oral   Oral  SpO2:  98% 99% 98%  Weight:      Height:        Intake/Output Summary (Last 24 hours) at 03/19/2020 1324 Last data filed at 03/19/2020 0092 Gross per 24 hour  Intake 338.35 ml  Output 970 ml  Net -631.65 ml   Filed Weights   03/17/20 0437 03/15/2020 0530 03/19/20 0418  Weight: 67.1 kg 65.8 kg 66 kg    Examination:  General exam: Frail looking chronically sick gentleman, anxious, not in any distress. On room air. Respiratory system: Clear to auscultation.  No added sounds. Cardiovascular system: S1 & S2 heard, RRR. No JVD,  murmurs, rubs, gallops or clicks. No pedal edema. Gastrointestinal system: Abdomen is nondistended, soft and nontender. No organomegaly or masses felt. Normal bowel sounds heard. Indwelling Foley catheter with clear urine. Central nervous system: Alert and oriented. No focal neurological deficits.  Generalized weakness. Extremities: Patient has left forearm AV fistula with thrill. Skin: No rashes, lesions or ulcers Psychiatry: Judgement and insight appear normal. Mood & affect anxious.    Data Reviewed: I have personally reviewed following labs and imaging studies  CBC: Recent Labs  Lab 03/13/2020 1529 03/16/20 0521 03/17/20 0344 03/16/2020 0420 03/19/20 0434  WBC 7.4 7.8 10.8* 7.8 8.4  NEUTROABS 6.3  --   --   --   --   HGB 10.6* 9.9* 9.6* 9.8* 9.7*  HCT 35.2* 33.0* 31.6* 32.3* 31.3*  MCV 104.5* 103.4* 101.0* 102.5* 100.0  PLT 171 127* PLATELET CLUMPS NOTED ON SMEAR, UNABLE TO ESTIMATE PLATELET CLUMPS NOTED ON SMEAR, UNABLE TO ESTIMATE PLATELET CLUMPS NOTED ON SMEAR, UNABLE TO ESTIMATE   Basic Metabolic Panel: Recent Labs  Lab 03/11/2020 1529 03/16/20 0521 03/17/20 0344 04/03/2020 0420 03/19/20 0434  NA 134* 135 134* 134* 131*  K 4.7 4.4 4.5 4.6 4.5  CL 103 107 105 104 101  CO2 17* 15* 13* 13* 12*  GLUCOSE 202* 129* 201* 220* 244*  BUN 80* 82* 87* 104* 120*  CREATININE 6.27* 5.62* 5.72* 6.35* 6.53*  CALCIUM 8.9 8.5* 8.3* 8.4* 8.2*  PHOS  --   --   --   --  6.2*   GFR: Estimated Creatinine Clearance: 7.7 mL/min (A) (by C-G formula based on SCr of 6.53 mg/dL (H)). Liver Function Tests: Recent Labs  Lab 03/19/2020 1529 03/16/20 0521 03/19/20 0434  AST 74* 64*  --   ALT 25 25  --   ALKPHOS 65 65  --   BILITOT 0.8 0.7  --   PROT 7.2 6.4*  --   ALBUMIN 3.6 3.3* 2.6*   Recent Labs  Lab 03/26/2020 1529  LIPASE 30   No results for input(s): AMMONIA in the last 168 hours. Coagulation Profile: Recent Labs  Lab 03/16/20 0521  INR 1.6*   Cardiac Enzymes: No results  for input(s): CKTOTAL, CKMB, CKMBINDEX, TROPONINI in the last 168 hours. BNP (last 3 results) No results for input(s): PROBNP in the last 8760 hours. HbA1C: No results for input(s): HGBA1C in the last 72 hours. CBG: Recent Labs  Lab 04/01/2020 1514  GLUCAP 170*   Lipid Profile: Recent Labs    03/17/20 1138  CHOL 173  HDL 64  LDLCALC 91  TRIG 91  CHOLHDL 2.7   Thyroid Function Tests: No results for input(s): TSH, T4TOTAL, FREET4, T3FREE, THYROIDAB in the last 72 hours. Anemia Panel: No results for input(s): VITAMINB12, FOLATE, FERRITIN, TIBC, IRON, RETICCTPCT in the last 72 hours. Sepsis Labs: No results for input(s): PROCALCITON, LATICACIDVEN in the last 168  hours.  Recent Results (from the past 240 hour(s))  Respiratory Panel by RT PCR (Flu A&B, Covid) - Nasopharyngeal Swab     Status: None   Collection Time: 03/31/2020  3:29 PM   Specimen: Nasopharyngeal Swab  Result Value Ref Range Status   SARS Coronavirus 2 by RT PCR NEGATIVE NEGATIVE Final    Comment: (NOTE) SARS-CoV-2 target nucleic acids are NOT DETECTED.  The SARS-CoV-2 RNA is generally detectable in upper respiratoy specimens during the acute phase of infection. The lowest concentration of SARS-CoV-2 viral copies this assay can detect is 131 copies/mL. A negative result does not preclude SARS-Cov-2 infection and should not be used as the sole basis for treatment or other patient management decisions. A negative result may occur with  improper specimen collection/handling, submission of specimen other than nasopharyngeal swab, presence of viral mutation(s) within the areas targeted by this assay, and inadequate number of viral copies (<131 copies/mL). A negative result must be combined with clinical observations, patient history, and epidemiological information. The expected result is Negative.  Fact Sheet for Patients:  PinkCheek.be  Fact Sheet for Healthcare Providers:    GravelBags.it  This test is no t yet approved or cleared by the [place] FDA and  has been authorized for detection and/or diagnosis of SARS-CoV-2 by FDA under an Emergency Use Authorization (EUA). This EUA will remain  in effect (meaning this test can be used) for the duration of the COVID-19 declaration under Section 564(b)(1) of the Act, 21 U.S.C. section 360bbb-3(b)(1), unless the authorization is terminated or revoked sooner.     Influenza A by PCR NEGATIVE NEGATIVE Final   Influenza B by PCR NEGATIVE NEGATIVE Final    Comment: (NOTE) The Xpert Xpress SARS-CoV-2/FLU/RSV assay is intended as an aid in  the diagnosis of influenza from Nasopharyngeal swab specimens and  should not be used as a sole basis for treatment. Nasal washings and  aspirates are unacceptable for Xpert Xpress SARS-CoV-2/FLU/RSV  testing.  Fact Sheet for Patients: PinkCheek.be  Fact Sheet for Healthcare Providers: GravelBags.it  This test is not yet approved or cleared by the [place] FDA and  has been authorized for detection and/or diagnosis of SARS-CoV-2 by  FDA under an Emergency Use Authorization (EUA). This EUA will remain  in effect (meaning this test can be used) for the duration of the  Covid-19 declaration under Section 564(b)(1) of the Act, 21  U.S.C. section 360bbb-3(b)(1), unless the authorization is  terminated or revoked. Performed at [facility], [place] [address], [place], [place] [address]          Radiology Studies: CARDIAC CATHETERIZATION  Result Date: 03/20/2020 The procedure was canceled because the patient was unable to lie flat.  VAS [place] LOWER EXTREMITY VENOUS (DVT)  Result Date: 03/17/2020  Lower Venous DVTStudy Indications: Edema.  Risk Factors: CKD. Anticoagulation: Heparin. Performing Technologist: Griffin Basil RCT RDMS  Examination Guidelines: A  complete evaluation includes B-mode imaging, spectral Doppler, color Doppler, and power Doppler as needed of all accessible portions of each vessel. Bilateral testing is considered an integral part of a complete examination. Limited examinations for reoccurring indications may be performed as noted. The reflux portion of the exam is performed with the patient in reverse Trendelenburg.  +---------+---------------+---------+-----------+----------+--------------+ RIGHT    CompressibilityPhasicitySpontaneityPropertiesThrombus Aging +---------+---------------+---------+-----------+----------+--------------+ CFV      Full           Yes      Yes                                 +---------+---------------+---------+-----------+----------+--------------+  SFJ      Full                                                        +---------+---------------+---------+-----------+----------+--------------+ FV Prox  Full                                                        +---------+---------------+---------+-----------+----------+--------------+ FV Mid   Full                                                        +---------+---------------+---------+-----------+----------+--------------+ FV DistalFull                                                        +---------+---------------+---------+-----------+----------+--------------+ PFV      Full                                                        +---------+---------------+---------+-----------+----------+--------------+ POP      Full           Yes      Yes                                 +---------+---------------+---------+-----------+----------+--------------+ PTV      Full                                                        +---------+---------------+---------+-----------+----------+--------------+ PERO     Full                                                         +---------+---------------+---------+-----------+----------+--------------+   +---------+---------------+---------+-----------+----------+--------------+ LEFT     CompressibilityPhasicitySpontaneityPropertiesThrombus Aging +---------+---------------+---------+-----------+----------+--------------+ CFV      Full           Yes      Yes                                 +---------+---------------+---------+-----------+----------+--------------+ SFJ      Full                                                        +---------+---------------+---------+-----------+----------+--------------+  FV Prox  Full                                                        +---------+---------------+---------+-----------+----------+--------------+ FV Mid   Full                                                        +---------+---------------+---------+-----------+----------+--------------+ FV DistalFull                                                        +---------+---------------+---------+-----------+----------+--------------+ PFV      Full                                                        +---------+---------------+---------+-----------+----------+--------------+ POP      Full           Yes      Yes                                 +---------+---------------+---------+-----------+----------+--------------+ PTV      Full                                                        +---------+---------------+---------+-----------+----------+--------------+ PERO     Full                                                        +---------+---------------+---------+-----------+----------+--------------+     Summary: RIGHT: - There is no evidence of deep vein thrombosis in the lower extremity.  - No cystic structure found in the popliteal fossa.  LEFT: - There is no evidence of deep vein thrombosis in the lower extremity.  - No cystic structure found in the popliteal fossa.   *See table(s) above for measurements and observations. Electronically signed by Monica Martinez MD on 03/17/2020 at 5:29:28 PM.    Final         Scheduled Meds: . aspirin EC  81 mg Oral Daily  . Chlorhexidine Gluconate Cloth  6 each Topical Daily  . colchicine  0.6 mg Oral Once  . feeding supplement (NEPRO CARB STEADY)  237 mL Oral BID BM  . mouth rinse  15 mL Mouth Rinse BID  . metolazone  10 mg Oral Daily  . midodrine  5 mg Oral TID WC  . pantoprazole  40 mg Oral Daily  . rosuvastatin  20 mg Oral Daily  . sodium bicarbonate  650  mg Oral BID  . sodium chloride flush  3 mL Intravenous Q12H   Continuous Infusions: . furosemide 160 mg (03/19/20 0835)  . heparin 1,250 Units/hr (03/19/20 0649)     LOS: 4 days    Time spent: 30 minutes    Barb Merino, MD [facility] Pager [phone]

## 2020-03-19 NOTE — Consult Note (Signed)
Chief Complaint: Patient was seen in consultation today for renal failure  Referring Physician(s): Dr. Joelyn Oms   Supervising Physician: Aletta Edouard  Patient Status: [facility] - In-pt  History of Present Illness: Richard Moses is a 86 y.o. male with past medical history of BPH, foley catheter dependence, stage V chronic kidney disease s/p L forearm AV fistula creation 02/25/20 who presented to the [facility] ED with nausea/vomiting.  He was found to have worsening renal function and NSTEMI.  He is now in need of dialysis initiation.  His fistula has not yet matured. IR consulted for tunneled dialysis catheter placement.   [name] is assessed at bedside.  He is sleepy.  Tells me he feels "ok but tired." He is wanting to trial dialysis and is agreeable to catheter placement today.  Of note, he did undergo attempted heart catheterization yesterday, however was extremely orthopneic and was unable to tolerate.  Patient was given lasix with some UOP in foley this AM.   He has been NPO.   Past Medical History:  Diagnosis Date   Abnormal MRI, cervical spine 09/29/10   Mildly abnormal MRI cervical spine demonstrating mild spondylosis and disc bulging from C4-5 down to C7-T1.  No spinal stenosis or foraminal narrowing   Anemia    Arthritis    Blood transfusion without reported diagnosis    BPH (benign prostatic hyperplasia)    Cancer (HCC)    possible melanoma on lt.leg   Cataract    beginning stage   Cervical disc herniation 09/29/10   C4-C5 and C7-T1   Chronic kidney disease (CKD)    stage 4 per dr patel nephrology lov 10-11-2019   Chronic left-sided headaches    Colon polyp 2007   Diabetes mellitus    diet dontrolled   Elevated PSA    External hemorrhoids without mention of complication 6629   GERD (gastroesophageal reflux disease)    History of hypertension    no current meds for   Hydronephrosis    acute renal failure superimposed on stage 4 ckd   Hyperlipidemia      Hypothyroidism 09/29/10   Untreated. Patient not interested in Rx.    Intermittent self-catheterization of bladder    placed every month, foley in all the time   Knee pain, bilateral 02/05/2013   Pain since a fall in 2014   MRI of brain abnormal 09/29/10   Abnormal MRI of brain, demonstrating mild atrophy   Neck pain on left side 2012   Neuropathy    Plantar fasciitis    left foot   Secondary hyperparathyroidism [facility])     Past Surgical History:  Procedure Laterality Date   AV FISTULA PLACEMENT Left 02/25/2020   Procedure: ARTERIOVENOUS (AV) FISTULA CREATION LEFT;  Surgeon: Elam Dutch, MD;  Location: [facility];  Service: Vascular;  Laterality: Left;   COLONOSCOPY     CYSTOSCOPY WITH LITHOLAPAXY N/A 11/19/2019   Procedure: CYSTOSCOPY WITH LITHOLAPAXY;  Surgeon: Lucas Mallow, MD;  Location: [facility];  Service: Urology;  Laterality: N/A;   LEFT HEART CATH AND CORONARY ANGIOGRAPHY N/A 03/29/2020   Procedure: LEFT HEART CATH AND CORONARY ANGIOGRAPHY;  Surgeon: Belva Crome, MD;  Location: [facility] [address] [facility];  Service: Cardiovascular;  Laterality: N/A;   left knee melanoma removed  2018   MELANOMA EXCISION     left side of neck   POLYPECTOMY     RHINOPLASTY     x2     Allergies: Patient has  no known allergies.  Medications: Prior to Admission medications   Medication Sig Start Date End Date Taking? Authorizing Provider  acetaminophen (TYLENOL) 650 MG CR tablet Take 650 mg by mouth every 8 (eight) hours as needed for pain or fever.   Yes [provider]  Acetylcysteine (N-ACETYL-L-CYSTEINE) 600 MG CAPS Take 1 capsule by mouth daily.   Yes [provider]  Ascorbic Acid (VITAMIN C) 1000 MG tablet Take 1,000 mg by mouth daily.   Yes [provider]  B Complex-C-Folic Acid (B-COMPLEX-C, W/FOLIC ACID, PO) Take 1 tablet by mouth daily.   Yes [provider]  Barberry-Oreg Grape-Goldenseal (BERBERINE COMPLEX PO)  Take 1 tablet by mouth 3 (three) times daily before meals.   Yes [provider]  Cholecalciferol (D3 HIGH POTENCY) 50 MCG (2000 UT) CAPS Take 2,000 Units by mouth daily.   Yes [provider]  Chromium Picolinate 800 MCG TABS Take 800 mcg by mouth daily.   Yes [provider]  CINNAMON PO Take 4,000 mg by mouth daily. 2 capsules   Yes [provider]  Cranberry-Vitamin C-Vitamin E (CRANBERRY PLUS VITAMIN C) 4200-20-3 MG-MG-UNIT CAPS Take 2 capsules by mouth 3 (three) times daily.   Yes [provider]  ferrous sulfate 325 (65 FE) MG tablet Take 325 mg by mouth daily with breakfast.   Yes [provider]  Ginkgo Biloba 120 MG CAPS Take 120 mg by mouth daily.   Yes [provider]  magnesium gluconate (MAGONATE) 500 MG tablet Take 500 mg by mouth daily.   Yes [provider]  OVER THE COUNTER MEDICATION Take 1 capsule by mouth in the morning and at bedtime. Circulation calcium disodium edta   Yes [provider]  OVER THE COUNTER MEDICATION Take 4-6 capsules by mouth with breakfast, with lunch, and with evening meal. Colon cleanser   Yes [provider]  OVER THE COUNTER MEDICATION Take 1 capsule by mouth in the morning and at bedtime. Stemcell maxum   Yes [provider]  Selenium 200 MCG CAPS Take 200 mcg by mouth daily.   Yes [provider]  Turmeric, Lear Ng, (CURCUMIN) POWD Take 1 capsule by mouth daily.   Yes [provider]  sulfamethoxazole-trimethoprim (BACTRIM DS) 800-160 MG tablet Take 1 tablet by mouth 2 (two) times daily. 03/04/20   [provider]  traMADol (ULTRAM) 50 MG tablet Take 1 tablet (50 mg total) by mouth every 6 (six) hours as needed. Patient not taking: Reported on 03/16/2020 02/25/20   Ulyses Amor, PA-C     Family History  Problem Relation Age of Onset   Cancer Father 84       Renal CA   Cancer Sister        Brain    Cancer Brother         Brain   Colon cancer Neg Hx    Stomach cancer Neg Hx    Colon polyps Neg Hx    Esophageal cancer Neg Hx    Rectal cancer Neg Hx     Social History   Socioeconomic History   Marital status: Single    Spouse name: Not on file   Number of children: 0   Years of education: Not on file   Highest education level: Not on file  Occupational History   Occupation: Lawyer    Employer: OTHER  Tobacco Use   Smoking status: Former Smoker    Types: Cigarettes    Quit date:  12/06/1966    Years since quitting: 53.3   Smokeless tobacco: Never Used  Vaping Use   Vaping Use: Never used  Substance and Sexual Activity   Alcohol use: Not Currently    Alcohol/week: 0.0 standard drinks   Drug use: No   Sexual activity: Never  Other Topics Concern   Not on file  Social History Narrative   Lives with boarder (young [place] man) - he helps with computer work managing his property in [place]. [name] is a Systems developer and an interpreter.      Family lives in [place] where he is from originally       Updated 12/11/2015   Social Determinants of Health   Financial Resource Strain:    Difficulty of Paying Living Expenses: Not on file  Food Insecurity:    Worried About Charity fundraiser in the Last Year: Not on file   [employer] of Food in the Last Year: Not on file  Transportation Needs:    Lack of Transportation (Medical): Not on file   Lack of Transportation (Non-Medical): Not on file  Physical Activity:    Days of Exercise per Week: Not on file   Minutes of Exercise per Session: Not on file  Stress:    Feeling of Stress : Not on file  Social Connections:    Frequency of Communication with Friends and Family: Not on file   Frequency of Social Gatherings with Friends and Family: Not on file   Attends Religious Services: Not on file   Active Member of Clubs or Organizations: Not on file   Attends Archivist Meetings: Not on file     Marital Status: Not on file     Review of Systems: A 12 point ROS discussed and pertinent positives are indicated in the HPI above.  All other systems are negative.  Review of Systems  Constitutional: Negative for fatigue and fever.  Respiratory: Negative for cough and shortness of breath.   Cardiovascular: Negative for chest pain.  Gastrointestinal: Positive for nausea and vomiting. Negative for abdominal pain.  Musculoskeletal: Negative for back pain.  Psychiatric/Behavioral: Negative for behavioral problems and confusion.    Vital Signs: BP 101/75    Pulse (!) 108    Temp 98.5 F (36.9 C) (Oral)    Resp 20    Ht 5\' 11"  (1.803 m)    Wt 145 lb 8.1 oz (66 kg)    SpO2 99%    BMI 20.29 kg/m   Physical Exam Vitals and nursing note reviewed.  Constitutional:      Appearance: He is well-developed.  Cardiovascular:     Rate and Rhythm: Normal rate and regular rhythm.  Pulmonary:     Effort: Pulmonary effort is normal.     Breath sounds: Examination of the right-upper field reveals wheezing. Examination of the left-upper field reveals wheezing. Wheezing present.  Genitourinary:    Comments: Foley in place with dark yellow urine Skin:    General: Skin is warm and dry.  Neurological:     General: No focal deficit present.     Mental Status: He is alert and oriented to person, place, and time.  Psychiatric:        Mood and Affect: Mood normal.        Behavior: Behavior normal.      MD Evaluation Airway: WNL Heart: WNL Abdomen: WNL Chest/ Lungs: WNL ASA  Classification: 3   Imaging: DG Chest 2 View  Result Date: 04/06/2020 CLINICAL  DATA:  Shortness of breath EXAM: CHEST - 2 VIEW COMPARISON:  July 19, 2016 FINDINGS: The cardiomediastinal silhouette is unchanged in contour.Atherosclerotic calcifications of the aorta. There isa small RIGHT pleural effusion. No pneumothorax. No acute pleuroparenchymal abnormality. Visualized abdomen is unremarkable. Mild degenerative  changes of the thoracic spine. IMPRESSION: Small RIGHT pleural effusion. Electronically Signed   By: Valentino Saxon MD   On: 03/14/2020 15:53   CT Head Wo Contrast  Result Date: 03/25/2020 CLINICAL DATA:  86 year old male with syncope. EXAM: CT HEAD WITHOUT CONTRAST TECHNIQUE: Contiguous axial images were obtained from the base of the skull through the vertex without intravenous contrast. COMPARISON:  None. FINDINGS: Brain: There is mild age-related atrophy and chronic microvascular ischemic changes. Subcentimeter right basal ganglia hypodense foci, likely old lacunar infarcts. There is no acute intracranial hemorrhage. No mass effect or midline shift. No extra-axial fluid collection. Vascular: No hyperdense vessel or unexpected calcification. Skull: Normal. Negative for fracture or focal lesion. Sinuses/Orbits: No acute finding. Other: None IMPRESSION: 1. No acute intracranial pathology. 2. Mild age-related atrophy and chronic microvascular ischemic changes. Electronically Signed   By: Anner Crete M.D.   On: 04/06/2020 17:18   NM Pulmonary Perfusion  Result Date: 03/13/2020 CLINICAL DATA:  Shortness of breath EXAM: NUCLEAR MEDICINE PERFUSION LUNG SCAN TECHNIQUE: Perfusion images were obtained in multiple projections after intravenous injection of radiopharmaceutical. Ventilation scans intentionally deferred if perfusion scan and chest x-ray adequate for interpretation during COVID 19 epidemic. RADIOPHARMACEUTICALS:  4.2 mCi Tc-57m MAA IV COMPARISON:  March 15, 2020. FINDINGS: Evaluation limited secondary to inability of patient to raise arms which resulted in artifact. Mildly heterogeneous perfusion bilaterally without focal wedge-shaped defect. IMPRESSION: Low probability for PE. Electronically Signed   By: Valentino Saxon MD   On: 04/02/2020 19:15   CARDIAC CATHETERIZATION  Result Date: 03/28/2020 The procedure was canceled because the patient was unable to lie flat.  DG Swallowing  Func-Speech Pathology  Result Date: 03/17/2020 Objective Swallowing Evaluation: Type of Study: MBS-Modified Barium Swallow Study  Patient Details Name: SLY PARLEE MRN: [id number] Date of Birth: Sep 27, 1932 Today's Date: 03/17/2020 Time: SLP Start Time (ACUTE ONLY): 0831 -SLP Stop Time (ACUTE ONLY): 0856 SLP Time Calculation (min) (ACUTE ONLY): 25 min Past Medical History: Past Medical History: Diagnosis Date  Abnormal MRI, cervical spine 09/29/10  Mildly abnormal MRI cervical spine demonstrating mild spondylosis and disc bulging from C4-5 down to C7-T1.  No spinal stenosis or foraminal narrowing  Anemia   Arthritis   Blood transfusion without reported diagnosis   BPH (benign prostatic hyperplasia)   Cancer (HCC)   possible melanoma on lt.leg  Cataract   beginning stage  Cervical disc herniation 09/29/10  C4-C5 and C7-T1  Chronic kidney disease (CKD)   stage 4 per dr patel nephrology lov 10-11-2019  Chronic left-sided headaches   Colon polyp 2007  Diabetes mellitus   diet dontrolled  Elevated PSA   External hemorrhoids without mention of complication 4098  GERD (gastroesophageal reflux disease)   History of hypertension   no current meds for  Hydronephrosis   acute renal failure superimposed on stage 4 ckd  Hyperlipidemia   Hypothyroidism 09/29/10  Untreated. Patient not interested in Rx.   Intermittent self-catheterization of bladder   placed every month, foley in all the time  Knee pain, bilateral 02/05/2013  Pain since a fall in 2014  MRI of brain abnormal 09/29/10  Abnormal MRI of brain, demonstrating mild atrophy  Neck pain on left side 2012  Neuropathy  Plantar fasciitis   left foot  Secondary hyperparathyroidism [facility])  Past Surgical History: Past Surgical History: Procedure Laterality Date  AV FISTULA PLACEMENT Left 02/25/2020  Procedure: ARTERIOVENOUS (AV) FISTULA CREATION LEFT;  Surgeon: Elam Dutch, MD;  Location: [facility];  Service: Vascular;  Laterality: Left;  COLONOSCOPY     CYSTOSCOPY WITH LITHOLAPAXY N/A 11/19/2019  Procedure: CYSTOSCOPY WITH LITHOLAPAXY;  Surgeon: Lucas Mallow, MD;  Location: [facility];  Service: Urology;  Laterality: N/A;  left knee melanoma removed  2018  MELANOMA EXCISION    left side of neck  POLYPECTOMY    RHINOPLASTY    x2  HPI: Patient is an 86 y.o. male with PMH: CKD stage V, BPH, melanoma, diet controlled DM-2, GERD, cervical disc herniation, MRI cervical spine revealing mild spondylosis and disc bulging from C4-5 down to C7-T1, bell's palsy on right side for many years. He presented to hospital. On 10/6, patient started having worsening SOB on exertion as well as at rest with fatigue. While walking to the mall on 10/6, he passed out and woke up on the ground. In AM on day of admission, he had episode of nausea and two episodes of vomiting. Patient's son reported that patient passed out while walking to car. CT head negative for intracranial pathology. CXR showed small right pleural effusion.  Subjective: pleasant, had just moved to unit from downstairs Assessment / Plan / Recommendation CHL IP CLINICAL IMPRESSIONS 03/17/2020 Clinical Impression Pt was seen for MBS, which revealed moderate oropharyngeal dysphagia as characterized by delayed oral transit, reduced epiglottic deflection, instances of aspiration, multiple swallows, and significant pharyngeal residue. Pt was lethargic throughout MBS, frequently reporting how tired he felt. Delayed oral tranit occurred across POs. During the swallow, epiglottic deflection was significantly reduced (nearly nonexistent) as the epiglottis stayed in a fixed, cupped position. Multiple swallows were also observed across POs. Additionally, significant residue was observed in the valleculae and pyriforms across POs. Mild palatal residue was observed with a regular solid. SLP attempted various strategies to address residue, including head turn, chin tuck, liquid wash, volitional cough/swallow, and  effortful swallow. Majority of strategies were ineffective, and instances of trace aspiration of thin liquid were observed during the swallow while attempting a head turn. Use of effortful swallow yielded the best results in reducing residue. Recommend dys 3 diet and thin liquid with use of effortful swallow. Crush meds in puree. SLP will f/u acutely to reinforce/educate on use of effortful swallow and check diet tolerance.  SLP Visit Diagnosis Dysphagia, oropharyngeal phase (R13.12) Attention and concentration deficit following -- Frontal lobe and executive function deficit following -- Impact on safety and function Moderate aspiration risk   CHL IP TREATMENT RECOMMENDATION 03/17/2020 Treatment Recommendations Therapy as outlined in treatment plan below   Prognosis 03/17/2020 Prognosis for Safe Diet Advancement Fair Barriers to Reach Goals -- Barriers/Prognosis Comment -- CHL IP DIET RECOMMENDATION 03/17/2020 SLP Diet Recommendations Dysphagia 3 (Mech soft) solids;Thin liquid Liquid Administration via Straw;Cup Medication Administration Crushed with puree Compensations Minimize environmental distractions;Slow rate;Small sips/bites;Effortful swallow Postural Changes --   CHL IP OTHER RECOMMENDATIONS 03/17/2020 Recommended Consults -- Oral Care Recommendations Oral care BID Other Recommendations --   CHL IP FOLLOW UP RECOMMENDATIONS 03/16/2020 Follow up Recommendations Other (comment)   CHL IP FREQUENCY AND DURATION 03/17/2020 Speech Therapy Frequency (ACUTE ONLY) min 2x/week Treatment Duration 2 weeks      CHL IP ORAL PHASE 03/17/2020 Oral Phase Impaired Oral - Pudding Teaspoon -- Oral - Pudding Cup --  Oral - Honey Teaspoon -- Oral - Honey Cup -- Oral - Nectar Teaspoon -- Oral - Nectar Cup -- Oral - Nectar Straw Delayed oral transit Oral - Thin Teaspoon -- Oral - Thin Cup Delayed oral transit Oral - Thin Straw Delayed oral transit Oral - Puree Delayed oral transit Oral - Mech Soft -- Oral - Regular Delayed oral  transit;Lingual/palatal residue Oral - Multi-Consistency -- Oral - Pill -- Oral Phase - Comment --  CHL IP PHARYNGEAL PHASE 03/17/2020 Pharyngeal Phase Impaired Pharyngeal- Pudding Teaspoon -- Pharyngeal -- Pharyngeal- Pudding Cup -- Pharyngeal -- Pharyngeal- Honey Teaspoon -- Pharyngeal -- Pharyngeal- Honey Cup -- Pharyngeal -- Pharyngeal- Nectar Teaspoon -- Pharyngeal -- Pharyngeal- Nectar Cup -- Pharyngeal -- Pharyngeal- Nectar Straw Reduced epiglottic inversion;Pharyngeal residue - valleculae;Pharyngeal residue - pyriform;Compensatory strategies attempted (with notebox) Pharyngeal -- Pharyngeal- Thin Teaspoon -- Pharyngeal -- Pharyngeal- Thin Cup Compensatory strategies attempted (with notebox);Pharyngeal residue - pyriform;Pharyngeal residue - valleculae;Reduced epiglottic inversion Pharyngeal -- Pharyngeal- Thin Straw Compensatory strategies attempted (with notebox);Pharyngeal residue - pyriform;Pharyngeal residue - valleculae;Reduced epiglottic inversion;Trace aspiration Pharyngeal -- Pharyngeal- Puree Pharyngeal residue - valleculae;Pharyngeal residue - pyriform;Reduced epiglottic inversion Pharyngeal -- Pharyngeal- Mechanical Soft -- Pharyngeal -- Pharyngeal- Regular Pharyngeal residue - pyriform;Pharyngeal residue - valleculae;Reduced epiglottic inversion Pharyngeal -- Pharyngeal- Multi-consistency -- Pharyngeal -- Pharyngeal- Pill -- Pharyngeal -- Pharyngeal Comment --  CHL IP CERVICAL ESOPHAGEAL PHASE 11/15/2016 Cervical Esophageal Phase WFL Pudding Teaspoon -- Pudding Cup -- Honey Teaspoon -- Honey Cup -- Nectar Teaspoon -- Nectar Cup -- Nectar Straw -- Thin Teaspoon -- Thin Cup -- Thin Straw -- Puree -- Mechanical Soft -- Regular -- Multi-consistency -- Pill -- Cervical Esophageal Comment -- Herbie Baltimore, MA CCC-SLP Acute Rehabilitation Services Pager [phone] Office [phone] Lynann Beaver 03/17/2020, 10:13 AM              ECHOCARDIOGRAM COMPLETE  Result Date: 03/16/2020     ECHOCARDIOGRAM REPORT   Patient Name:   HAU SANOR Date of Exam: 03/16/2020 Medical Rec #:  [id number]      Height:       71.0 in Accession #:    [id number]     Weight:       140.0 lb Date of Birth:  July 10, 1932      BSA:          1.812 m Patient Age:    61 years       BP:           121/75 mmHg Patient Gender: M              HR:           98 bpm. Exam Location:  Inpatient Procedure: 2D Echo Indications:    elevated troponin  History:        Patient has no prior history of Echocardiogram examinations.                 Risk Factors:Diabetes, Dyslipidemia and Former Smoker.  Sonographer:    Jannett Celestine RDCS (AE) Referring Phys: [id number] [facility] M PATEL  Sonographer Comments: no true parasternal window IMPRESSIONS  1. Severe anteroseptal and basal to mid anterior hypokinesis. . Left ventricular ejection fraction, by estimation, is 35 to 40%. The left ventricle has moderately decreased function. The left ventricle demonstrates regional wall motion abnormalities (see scoring diagram/findings for description). There is mild left ventricular hypertrophy of the basal-septal segment. Left ventricular diastolic parameters are consistent with Grade II diastolic dysfunction (pseudonormalization). Elevated left ventricular end-diastolic pressure.  2.  Right ventricular systolic function is normal. The right ventricular size is normal. There is normal pulmonary artery systolic pressure.  3. Left atrial size was mildly dilated.  4. The mitral valve is normal in structure. Trivial mitral valve regurgitation. No evidence of mitral stenosis. Moderate mitral annular calcification.  5. The aortic valve was not well visualized. Aortic valve regurgitation is not visualized. Mild aortic valve stenosis. Aortic valve mean gradient measures 16.0 mmHg. Aortic valve Vmax measures 2.42 m/s.  6. The inferior vena cava is dilated in size with <50% respiratory variability, suggesting right atrial pressure of 15 mmHg. FINDINGS  Left Ventricle:  Severe anteroseptal and basal to mid anterior hypokinesis. Left ventricular ejection fraction, by estimation, is 35 to 40%. The left ventricle has moderately decreased function. The left ventricle demonstrates regional wall motion abnormalities. The left ventricular internal cavity size was normal in size. There is mild left ventricular hypertrophy of the basal-septal segment. Left ventricular diastolic parameters are consistent with Grade II diastolic dysfunction (pseudonormalization). Elevated left ventricular end-diastolic pressure. Right Ventricle: The right ventricular size is normal. No increase in right ventricular wall thickness. Right ventricular systolic function is normal. There is normal pulmonary artery systolic pressure. The tricuspid regurgitant velocity is 2.06 m/s, and  with an assumed right atrial pressure of 15 mmHg, the estimated right ventricular systolic pressure is 17.7 mmHg. Left Atrium: Left atrial size was mildly dilated. Right Atrium: Right atrial size was normal in size. Pericardium: There is no evidence of pericardial effusion. Mitral Valve: The mitral valve is normal in structure. Moderate mitral annular calcification. Trivial mitral valve regurgitation. No evidence of mitral valve stenosis. Tricuspid Valve: The tricuspid valve is normal in structure. Tricuspid valve regurgitation is mild . No evidence of tricuspid stenosis. Aortic Valve: The aortic valve was not well visualized. Aortic valve regurgitation is not visualized. Mild aortic stenosis is present. Aortic valve mean gradient measures 16.0 mmHg. Aortic valve peak gradient measures 23.4 mmHg. Aortic valve area, by VTI  measures 0.76 cm. Pulmonic Valve: The pulmonic valve was normal in structure. Pulmonic valve regurgitation is not visualized. No evidence of pulmonic stenosis. Aorta: The aortic root is normal in size and structure. Venous: The inferior vena cava is dilated in size with less than 50% respiratory variability,  suggesting right atrial pressure of 15 mmHg. IAS/Shunts: No atrial level shunt detected by color flow Doppler.  LEFT VENTRICLE PLAX 2D LVIDd:         4.60 cm  Diastology LVIDs:         3.80 cm  LV e' medial:    6.20 cm/s LV PW:         1.24 cm  LV E/e' medial:  16.9 LV IVS:        1.00 cm  LV e' lateral:   6.64 cm/s LVOT diam:     2.10 cm  LV E/e' lateral: 15.8 LV SV:         39 LV SV Index:   21 LVOT Area:     3.46 cm  LEFT ATRIUM           Index LA diam:      3.90 cm 2.15 cm/m LA Vol (A4C): 53.2 ml 29.36 ml/m  AORTIC VALVE AV Area (Vmax):    0.75 cm AV Area (Vmean):   0.74 cm AV Area (VTI):     0.76 cm AV Vmax:           242.00 cm/s AV Vmean:  193.000 cm/s AV VTI:            0.512 m AV Peak Grad:      23.4 mmHg AV Mean Grad:      16.0 mmHg LVOT Vmax:         52.20 cm/s LVOT Vmean:        41.400 cm/s LVOT VTI:          0.112 m LVOT/AV VTI ratio: 0.22  AORTA Ao Root diam: 3.00 cm MITRAL VALVE                TRICUSPID VALVE MV Area (PHT): 4.39 cm     TR Peak grad:   17.0 mmHg MV Decel Time: 173 msec     TR Vmax:        206.00 cm/s MV E velocity: 105.00 cm/s                             SHUNTS                             Systemic VTI:  0.11 m                             Systemic Diam: 2.10 cm Skeet Latch MD Electronically signed by Skeet Latch MD Signature Date/Time: 03/16/2020/12:36:03 PM    Final    VAS [place] LOWER EXTREMITY VENOUS (DVT)  Result Date: 03/17/2020  Lower Venous DVTStudy Indications: Edema.  Risk Factors: CKD. Anticoagulation: Heparin. Performing Technologist: Griffin Basil RCT RDMS  Examination Guidelines: A complete evaluation includes B-mode imaging, spectral Doppler, color Doppler, and power Doppler as needed of all accessible portions of each vessel. Bilateral testing is considered an integral part of a complete examination. Limited examinations for reoccurring indications may be performed as noted. The reflux portion of the exam is performed with the patient in reverse  Trendelenburg.  +---------+---------------+---------+-----------+----------+--------------+  RIGHT     Compressibility Phasicity Spontaneity Properties Thrombus Aging  +---------+---------------+---------+-----------+----------+--------------+  CFV       Full            Yes       Yes                                    +---------+---------------+---------+-----------+----------+--------------+  SFJ       Full                                                             +---------+---------------+---------+-----------+----------+--------------+  FV Prox   Full                                                             +---------+---------------+---------+-----------+----------+--------------+  FV Mid    Full                                                             +---------+---------------+---------+-----------+----------+--------------+  FV Distal Full                                                             +---------+---------------+---------+-----------+----------+--------------+  PFV       Full                                                             +---------+---------------+---------+-----------+----------+--------------+  POP       Full            Yes       Yes                                    +---------+---------------+---------+-----------+----------+--------------+  PTV       Full                                                             +---------+---------------+---------+-----------+----------+--------------+  PERO      Full                                                             +---------+---------------+---------+-----------+----------+--------------+   +---------+---------------+---------+-----------+----------+--------------+  LEFT      Compressibility Phasicity Spontaneity Properties Thrombus Aging  +---------+---------------+---------+-----------+----------+--------------+  CFV       Full            Yes       Yes                                     +---------+---------------+---------+-----------+----------+--------------+  SFJ       Full                                                             +---------+---------------+---------+-----------+----------+--------------+  FV Prox   Full                                                             +---------+---------------+---------+-----------+----------+--------------+  FV Mid    Full                                                             +---------+---------------+---------+-----------+----------+--------------+  FV Distal Full                                                             +---------+---------------+---------+-----------+----------+--------------+  PFV       Full                                                             +---------+---------------+---------+-----------+----------+--------------+  POP       Full            Yes       Yes                                    +---------+---------------+---------+-----------+----------+--------------+  PTV       Full                                                             +---------+---------------+---------+-----------+----------+--------------+  PERO      Full                                                             +---------+---------------+---------+-----------+----------+--------------+     Summary: RIGHT: - There is no evidence of deep vein thrombosis in the lower extremity.  - No cystic structure found in the popliteal fossa.  LEFT: - There is no evidence of deep vein thrombosis in the lower extremity.  - No cystic structure found in the popliteal fossa.  *See table(s) above for measurements and observations. Electronically signed by Monica Martinez MD on 03/17/2020 at 5:29:28 PM.    Final     Labs:  CBC: Recent Labs    03/16/20 0521 03/17/20 0344 03/16/2020 0420 03/19/20 0434  WBC 7.8 10.8* 7.8 8.4  HGB 9.9* 9.6* 9.8* 9.7*  HCT 33.0* 31.6* 32.3* 31.3*  PLT 127* PLATELET CLUMPS NOTED ON SMEAR, UNABLE TO ESTIMATE  PLATELET CLUMPS NOTED ON SMEAR, UNABLE TO ESTIMATE PLATELET CLUMPS NOTED ON SMEAR, UNABLE TO ESTIMATE    COAGS: Recent Labs    03/16/20 0521  INR 1.6*    BMP: Recent Labs    12/06/19 0801 12/06/19 0801 01/03/20 0852 01/03/20 0852 01/31/20 0817 02/25/20 0759 03/03/20 0823 03/28/2020 1529 03/16/20 0521 03/17/20 0344 04/05/2020 0420 03/19/20 0434  NA 141   < > 140   < > 141   < > 139   < > 135 134* 134* 131*  K 4.0   < > 4.0   < > 4.3   < > 3.8   < > 4.4 4.5 4.6 4.5  CL 109   < > 105   < > 110   < > 106   < >  107 105 104 101  CO2 20*   < > 17*   < > 18*  --  19*   < > 15* 13* 13* 12*  GLUCOSE 104*   < > 105*   < > 112*   < > 108*   < > 129* 201* 220* 244*  BUN 67*   < > 76*   < > 78*   < > 69*   < > 82* 87* 104* 120*  CALCIUM 9.7   9.6   < > 9.3   9.5   < > 9.9   9.7  --  10.0   9.9   < > 8.5* 8.3* 8.4* 8.2*  CREATININE 4.58*   < > 4.83*   < > 5.31*   < > 4.30*   < > 5.62* 5.72* 6.35* 6.53*  GFRNONAA 11*   < > 10*   < > 9*  --  12*   < > 8* 8* 7* 7*  GFRAA 13*  --  12*  --  11*  --  14*  --   --   --   --   --    < > = values in this interval not displayed.    LIVER FUNCTION TESTS: Recent Labs    03/03/20 0823 04/01/2020 1529 03/16/20 0521 03/19/20 0434  BILITOT  --  0.8 0.7  --   AST  --  74* 64*  --   ALT  --  25 25  --   ALKPHOS  --  65 65  --   PROT  --  7.2 6.4*  --   ALBUMIN 3.6 3.6 3.3* 2.6*    TUMOR MARKERS: No results for input(s): AFPTM, CEA, CA199, CHROMGRNA in the last 8760 hours.  Assessment and Plan: End stage renal disease Patient in need of dialysis initiation.  He did have a L forearm fistula created in September which is not yet ready for use.  He will need a tunneled dialysis catheter.   Due to orthopnea his heart catheterization yesterday was postponed.  Attempted a trial of lying flat on unit with patient today which he tolerated well.  Denied complaint of shortness of breath or increased work of breathing.  His HR maintained 104-106 bpm, his  O2 saturations remained 95-98% with RR of 21-22.  Hopeful he can tolerate tunneled catheter with sedation.  Temp cath can be considered-- per Nephrology note, this would be acceptable in the interim.   Risks and benefits discussed with the patient including, but not limited to bleeding, infection, vascular injury, pneumothorax which may require chest tube placement, air embolism or even death  All of the patient's questions were answered, patient is agreeable to proceed. Consent signed and in chart.  Thank you for this interesting consult.  I greatly enjoyed meeting JOVANTE HAMMITT and look forward to participating in their care.  A copy of this report was sent to the requesting provider on this date.  Electronically Signed: Docia Barrier, PA 03/19/2020, 11:45 AM   I spent a total of 40 Minutes    in face to face in clinical consultation, greater than 50% of which was counseling/coordinating care for end stage renal disease stage V

## 2020-03-19 NOTE — Progress Notes (Addendum)
Floor coverage overnight event  Patient went into A. fib with RVR with rate 120-140s after dialysis tonight.  Hypotensive with systolic in the 37T.  Patient denies chest pain.  Complaining of some shortness of breath.  Stable on 2 L supplemental oxygen, satting 97-100%.  Not tachypneic.  EKG showing A. fib with RVR and ST depressions in lateral leads.  -Discussed with on-call cardiologist, recommending amiodarone bolus and infusion.  Nursing staff instructed to run bolus over 30 minutes given hypotension. -Receiving IV Lasix 160 mg every 6 hours, hold at this time due to hypotension -[id number] 6. Already on IV heparin for NSTEMI, continue.  1:30 AM patient finished his amiodarone bolus and converted back to sinus rhythm.  However, now having worsening hypotension with systolic in the 06Y to 69S.  He was seen at bedside.  Awake and alert but appeared lethargic. Complained of shortness of breath.  Denied chest pain or dizziness.  Very mild mild crackles appreciated at the left lung base upon auscultation of the lungs; no wheezing.  No increased work of breathing.  +2 pedal edema on the left. Temperature 97.5 F, heart rate in the 90s (sinus rhythm), respiratory rate 18, satting 100% on 2 L oxygen, blood pressure as low as 78/59.  -Stop amiodarone -Hold Lasix and metolazone -Increased dose of midodrine from 5 mg 3 times daily to 10 mg 3 times daily -250 cc fluid bolus ordered.  Stat chest x-ray ordered to assess for possible signs of volume overload.  Echo done 03/16/2020 with LVEF 35 to 40% and grade 2 diastolic dysfunction.  If no signs of volume overload and hypotension persists, give additional small fluid bolus to maintain MAP >65.  If chest x-ray with signs of pulmonary edema and hypotension persists, will likely need ICU transfer for vasopressor support. -Also check CBC, lactate, and procalcitonin level to assess for possible infectious etiology.  Chest x-ray to rule out pneumonia.  Update:  Blood pressure improved after small fluid bolus.  No leukocytosis or lactic acidosis on labs but procalcitonin significantly elevated at 3.33.  Chest x-ray not suggestive of pneumonia.  Empiric broad-spectrum antibiotics ordered at this time.  Blood cultures ordered.  Trend procalcitonin.

## 2020-03-19 NOTE — Progress Notes (Signed)
Progress Note  Patient Name: Richard Moses Date of Encounter: 03/19/2020  [facility] Cardiologist: Skeet Latch, MD   Subjective   Pt was resting at about 30 degree and sleeping when I entered the room. No complaints.    Inpatient Medications    Scheduled Meds: . aspirin EC  81 mg Oral Daily  . Chlorhexidine Gluconate Cloth  6 each Topical Daily  . feeding supplement (NEPRO CARB STEADY)  237 mL Oral BID BM  . mouth rinse  15 mL Mouth Rinse BID  . metolazone  10 mg Oral Daily  . midodrine  5 mg Oral TID WC  . pantoprazole  40 mg Oral Daily  . rosuvastatin  20 mg Oral Daily  . sodium bicarbonate  650 mg Oral BID  . sodium chloride flush  3 mL Intravenous Q12H   Continuous Infusions: . furosemide 160 mg (03/19/20 0250)  . heparin 1,250 Units/hr (03/19/20 0649)   PRN Meds: acetaminophen **OR** acetaminophen, morphine injection, ondansetron **OR** ondansetron (ZOFRAN) IV, traMADol   Vital Signs    Vitals:   03/19/20 0400 03/19/20 0418 03/19/20 0432 03/19/20 0725  BP:   104/72 101/69  Pulse:   (!) 114 (!) 109  Resp:   (!) 24 19  Temp: (!) 97.5 F (36.4 C)  (!) 97.5 F (36.4 C) 98.5 F (36.9 C)  TempSrc:  Oral  Oral  SpO2:   98%   Weight:  66 kg    Height:        Intake/Output Summary (Last 24 hours) at 03/19/2020 0833 Last data filed at 03/19/2020 0420 Gross per 24 hour  Intake 338.35 ml  Output 970 ml  Net -631.65 ml   Last 3 Weights 03/19/2020 03/28/2020 03/17/2020  Weight (lbs) 145 lb 8.1 oz 145 lb 1.6 oz 147 lb 14.9 oz  Weight (kg) 66 kg 65.817 kg 67.1 kg      Telemetry    Sinus tachycardia in the 100s - Personally Reviewed  ECG    No new tracings - Personally Reviewed  Physical Exam   GEN: No acute distress.   Neck: + JVD Cardiac: regular rhythm, tachycardic rate  Respiratory: Clear to auscultation bilaterally. GI: Soft, nontender, non-distended  MS: at least 1+ B LE edema Neuro:  Nonfocal  Psych: Normal affect   Labs      High Sensitivity Troponin:   Recent Labs  Lab 03/20/2020 1529 03/25/2020 1729  TROPONINIHS 7,391* 7,160*      Chemistry Recent Labs  Lab 03/23/2020 1529 03/18/2020 1529 03/16/20 0521 03/17/20 0344 03/18/20 0420  NA 134*   < > 135 134* 134*  K 4.7   < > 4.4 4.5 4.6  CL 103   < > 107 105 104  CO2 17*   < > 15* 13* 13*  GLUCOSE 202*   < > 129* 201* 220*  BUN 80*   < > 82* 87* 104*  CREATININE 6.27*   < > 5.62* 5.72* 6.35*  CALCIUM 8.9   < > 8.5* 8.3* 8.4*  PROT 7.2  --  6.4*  --   --   ALBUMIN 3.6  --  3.3*  --   --   AST 74*  --  64*  --   --   ALT 25  --  25  --   --   ALKPHOS 65  --  65  --   --   BILITOT 0.8  --  0.7  --   --   GFRNONAA 7*   < >  8* 8* 7*  ANIONGAP 14   < > 13 16* 17*   < > = values in this interval not displayed.     Hematology Recent Labs  Lab 03/17/20 0344 03/17/2020 0420 03/19/20 0434  WBC 10.8* 7.8 8.4  RBC 3.13* 3.15* 3.13*  HGB 9.6* 9.8* 9.7*  HCT 31.6* 32.3* 31.3*  MCV 101.0* 102.5* 100.0  MCH 30.7 31.1 31.0  MCHC 30.4 30.3 31.0  RDW 15.5 15.6* 15.2  PLT PLATELET CLUMPS NOTED ON SMEAR, UNABLE TO ESTIMATE PLATELET CLUMPS NOTED ON SMEAR, UNABLE TO ESTIMATE PLATELET CLUMPS NOTED ON SMEAR, UNABLE TO ESTIMATE    BNP Recent Labs  Lab 03/25/2020 1530  BNP 4,445.6*     DDimer No results for input(s): DDIMER in the last 168 hours.   Radiology    CARDIAC CATHETERIZATION  Result Date: 03/27/2020 The procedure was canceled because the patient was unable to lie flat.  DG Swallowing Func-Speech Pathology  Result Date: 03/17/2020 Objective Swallowing Evaluation: Type of Study: MBS-Modified Barium Swallow Study  Patient Details Name: Richard Moses MRN: [id number] Date of Birth: 09/09/1932 Today's Date: 03/17/2020 Time: SLP Start Time (ACUTE ONLY): 0831 -SLP Stop Time (ACUTE ONLY): 0856 SLP Time Calculation (min) (ACUTE ONLY): 25 min Past Medical History: Past Medical History: Diagnosis Date . Abnormal MRI, cervical spine 09/29/10  Mildly abnormal  MRI cervical spine demonstrating mild spondylosis and disc bulging from C4-5 down to C7-T1.  No spinal stenosis or foraminal narrowing . Anemia  . Arthritis  . Blood transfusion without reported diagnosis  . BPH (benign prostatic hyperplasia)  . Cancer [facility])   possible melanoma on lt.leg . Cataract   beginning stage . Cervical disc herniation 09/29/10  C4-C5 and C7-T1 . Chronic kidney disease (CKD)   stage 4 per dr patel nephrology lov 10-11-2019 . Chronic left-sided headaches  . Colon polyp 2007 . Diabetes mellitus   diet dontrolled . Elevated PSA  . External hemorrhoids without mention of complication 0354 . GERD (gastroesophageal reflux disease)  . History of hypertension   no current meds for . Hydronephrosis   acute renal failure superimposed on stage 4 ckd . Hyperlipidemia  . Hypothyroidism 09/29/10  Untreated. Patient not interested in Rx.  . Intermittent self-catheterization of bladder   placed every month, foley in all the time . Knee pain, bilateral 02/05/2013  Pain since a fall in 2014 . MRI of brain abnormal 09/29/10  Abnormal MRI of brain, demonstrating mild atrophy . Neck pain on left side 2012 . Neuropathy  . Plantar fasciitis   left foot . Secondary hyperparathyroidism [facility])  Past Surgical History: Past Surgical History: Procedure Laterality Date . AV FISTULA PLACEMENT Left 02/25/2020  Procedure: ARTERIOVENOUS (AV) FISTULA CREATION LEFT;  Surgeon: Elam Dutch, MD;  Location: [place];  Service: Vascular;  Laterality: Left; . COLONOSCOPY   . CYSTOSCOPY WITH LITHOLAPAXY N/A 11/19/2019  Procedure: CYSTOSCOPY WITH LITHOLAPAXY;  Surgeon: Lucas Mallow, MD;  Location: [facility];  Service: Urology;  Laterality: N/A; . left knee melanoma removed  2018 . MELANOMA EXCISION    left side of neck . POLYPECTOMY   . RHINOPLASTY    x2  HPI: Patient is an 86 y.o. male with PMH: CKD stage V, BPH, melanoma, diet controlled DM-2, GERD, cervical disc herniation, MRI cervical spine revealing mild  spondylosis and disc bulging from C4-5 down to C7-T1, bell's palsy on right side for many years. He presented to hospital. On 10/6, patient started having worsening SOB on exertion  as well as at rest with fatigue. While walking to the mall on 10/6, he passed out and woke up on the ground. In AM on day of admission, he had episode of nausea and two episodes of vomiting. Patient's son reported that patient passed out while walking to car. CT head negative for intracranial pathology. CXR showed small right pleural effusion.  Subjective: pleasant, had just moved to unit from downstairs Assessment / Plan / Recommendation CHL IP CLINICAL IMPRESSIONS 03/17/2020 Clinical Impression Pt was seen for MBS, which revealed moderate oropharyngeal dysphagia as characterized by delayed oral transit, reduced epiglottic deflection, instances of aspiration, multiple swallows, and significant pharyngeal residue. Pt was lethargic throughout MBS, frequently reporting how tired he felt. Delayed oral tranit occurred across POs. During the swallow, epiglottic deflection was significantly reduced (nearly nonexistent) as the epiglottis stayed in a fixed, cupped position. Multiple swallows were also observed across POs. Additionally, significant residue was observed in the valleculae and pyriforms across POs. Mild palatal residue was observed with a regular solid. SLP attempted various strategies to address residue, including head turn, chin tuck, liquid wash, volitional cough/swallow, and effortful swallow. Majority of strategies were ineffective, and instances of trace aspiration of thin liquid were observed during the swallow while attempting a head turn. Use of effortful swallow yielded the best results in reducing residue. Recommend dys 3 diet and thin liquid with use of effortful swallow. Crush meds in puree. SLP will f/u acutely to reinforce/educate on use of effortful swallow and check diet tolerance.  SLP Visit Diagnosis Dysphagia,  oropharyngeal phase (R13.12) Attention and concentration deficit following -- Frontal lobe and executive function deficit following -- Impact on safety and function Moderate aspiration risk   CHL IP TREATMENT RECOMMENDATION 03/17/2020 Treatment Recommendations Therapy as outlined in treatment plan below   Prognosis 03/17/2020 Prognosis for Safe Diet Advancement Fair Barriers to Reach Goals -- Barriers/Prognosis Comment -- CHL IP DIET RECOMMENDATION 03/17/2020 SLP Diet Recommendations Dysphagia 3 (Mech soft) solids;Thin liquid Liquid Administration via Straw;Cup Medication Administration Crushed with puree Compensations Minimize environmental distractions;Slow rate;Small sips/bites;Effortful swallow Postural Changes --   CHL IP OTHER RECOMMENDATIONS 03/17/2020 Recommended Consults -- Oral Care Recommendations Oral care BID Other Recommendations --   CHL IP FOLLOW UP RECOMMENDATIONS 03/16/2020 Follow up Recommendations Other (comment)   CHL IP FREQUENCY AND DURATION 03/17/2020 Speech Therapy Frequency (ACUTE ONLY) min 2x/week Treatment Duration 2 weeks      CHL IP ORAL PHASE 03/17/2020 Oral Phase Impaired Oral - Pudding Teaspoon -- Oral - Pudding Cup -- Oral - Honey Teaspoon -- Oral - Honey Cup -- Oral - Nectar Teaspoon -- Oral - Nectar Cup -- Oral - Nectar Straw Delayed oral transit Oral - Thin Teaspoon -- Oral - Thin Cup Delayed oral transit Oral - Thin Straw Delayed oral transit Oral - Puree Delayed oral transit Oral - Mech Soft -- Oral - Regular Delayed oral transit;Lingual/palatal residue Oral - Multi-Consistency -- Oral - Pill -- Oral Phase - Comment --  CHL IP PHARYNGEAL PHASE 03/17/2020 Pharyngeal Phase Impaired Pharyngeal- Pudding Teaspoon -- Pharyngeal -- Pharyngeal- Pudding Cup -- Pharyngeal -- Pharyngeal- Honey Teaspoon -- Pharyngeal -- Pharyngeal- Honey Cup -- Pharyngeal -- Pharyngeal- Nectar Teaspoon -- Pharyngeal -- Pharyngeal- Nectar Cup -- Pharyngeal -- Pharyngeal- Nectar Straw Reduced epiglottic  inversion;Pharyngeal residue - valleculae;Pharyngeal residue - pyriform;Compensatory strategies attempted (with notebox) Pharyngeal -- Pharyngeal- Thin Teaspoon -- Pharyngeal -- Pharyngeal- Thin Cup Compensatory strategies attempted (with notebox);Pharyngeal residue - pyriform;Pharyngeal residue - valleculae;Reduced epiglottic inversion Pharyngeal -- Pharyngeal- Thin Straw  Compensatory strategies attempted (with notebox);Pharyngeal residue - pyriform;Pharyngeal residue - valleculae;Reduced epiglottic inversion;Trace aspiration Pharyngeal -- Pharyngeal- Puree Pharyngeal residue - valleculae;Pharyngeal residue - pyriform;Reduced epiglottic inversion Pharyngeal -- Pharyngeal- Mechanical Soft -- Pharyngeal -- Pharyngeal- Regular Pharyngeal residue - pyriform;Pharyngeal residue - valleculae;Reduced epiglottic inversion Pharyngeal -- Pharyngeal- Multi-consistency -- Pharyngeal -- Pharyngeal- Pill -- Pharyngeal -- Pharyngeal Comment --  CHL IP CERVICAL ESOPHAGEAL PHASE 11/15/2016 Cervical Esophageal Phase WFL Pudding Teaspoon -- Pudding Cup -- Honey Teaspoon -- Honey Cup -- Nectar Teaspoon -- Nectar Cup -- Nectar Straw -- Thin Teaspoon -- Thin Cup -- Thin Straw -- Puree -- Mechanical Soft -- Regular -- Multi-consistency -- Pill -- Cervical Esophageal Comment -- Herbie Baltimore, MA CCC-SLP Acute Rehabilitation Services Pager [phone] Office [phone] Lynann Beaver 03/17/2020, 10:13 AM              VAS [place] LOWER EXTREMITY VENOUS (DVT)  Result Date: 03/17/2020  Lower Venous DVTStudy Indications: Edema.  Risk Factors: CKD. Anticoagulation: Heparin. Performing Technologist: Griffin Basil RCT RDMS  Examination Guidelines: A complete evaluation includes B-mode imaging, spectral Doppler, color Doppler, and power Doppler as needed of all accessible portions of each vessel. Bilateral testing is considered an integral part of a complete examination. Limited examinations for reoccurring indications may be  performed as noted. The reflux portion of the exam is performed with the patient in reverse Trendelenburg.  +---------+---------------+---------+-----------+----------+--------------+ RIGHT    CompressibilityPhasicitySpontaneityPropertiesThrombus Aging +---------+---------------+---------+-----------+----------+--------------+ CFV      Full           Yes      Yes                                 +---------+---------------+---------+-----------+----------+--------------+ SFJ      Full                                                        +---------+---------------+---------+-----------+----------+--------------+ FV Prox  Full                                                        +---------+---------------+---------+-----------+----------+--------------+ FV Mid   Full                                                        +---------+---------------+---------+-----------+----------+--------------+ FV DistalFull                                                        +---------+---------------+---------+-----------+----------+--------------+ PFV      Full                                                        +---------+---------------+---------+-----------+----------+--------------+  POP      Full           Yes      Yes                                 +---------+---------------+---------+-----------+----------+--------------+ PTV      Full                                                        +---------+---------------+---------+-----------+----------+--------------+ PERO     Full                                                        +---------+---------------+---------+-----------+----------+--------------+   +---------+---------------+---------+-----------+----------+--------------+ LEFT     CompressibilityPhasicitySpontaneityPropertiesThrombus Aging +---------+---------------+---------+-----------+----------+--------------+ CFV      Full            Yes      Yes                                 +---------+---------------+---------+-----------+----------+--------------+ SFJ      Full                                                        +---------+---------------+---------+-----------+----------+--------------+ FV Prox  Full                                                        +---------+---------------+---------+-----------+----------+--------------+ FV Mid   Full                                                        +---------+---------------+---------+-----------+----------+--------------+ FV DistalFull                                                        +---------+---------------+---------+-----------+----------+--------------+ PFV      Full                                                        +---------+---------------+---------+-----------+----------+--------------+ POP      Full           Yes      Yes                                 +---------+---------------+---------+-----------+----------+--------------+  PTV      Full                                                        +---------+---------------+---------+-----------+----------+--------------+ PERO     Full                                                        +---------+---------------+---------+-----------+----------+--------------+     Summary: RIGHT: - There is no evidence of deep vein thrombosis in the lower extremity.  - No cystic structure found in the popliteal fossa.  LEFT: - There is no evidence of deep vein thrombosis in the lower extremity.  - No cystic structure found in the popliteal fossa.  *See table(s) above for measurements and observations. Electronically signed by Monica Martinez MD on 03/17/2020 at 5:29:28 PM.    Final     Cardiac Studies   Echocardiogram 03/16/20: 1. Severe anteroseptal and basal to mid anterior hypokinesis. . Left  ventricular ejection fraction, by estimation, is 35 to  40%. The left  ventricle has moderately decreased function. The left ventricle  demonstrates regional wall motion abnormalities  (see scoring diagram/findings for description). There is mild left  ventricular hypertrophy of the basal-septal segment. Left ventricular  diastolic parameters are consistent with Grade II diastolic dysfunction  (pseudonormalization). Elevated left  ventricular end-diastolic pressure.  2. Right ventricular systolic function is normal. The right ventricular  size is normal. There is normal pulmonary artery systolic pressure.  3. Left atrial size was mildly dilated.  4. The mitral valve is normal in structure. Trivial mitral valve  regurgitation. No evidence of mitral stenosis. Moderate mitral annular  calcification.  5. The aortic valve was not well visualized. Aortic valve regurgitation  is not visualized. Mild aortic valve stenosis. Aortic valve mean gradient  measures 16.0 mmHg. Aortic valve Vmax measures 2.42 m/s.  6. The inferior vena cava is dilated in size with <50% respiratory  variability, suggesting right atrial pressure of 15 mmHg.   Patient Profile     86 y.o. male with PMH of HLD, DM type 2, hypothyroidism, secondary hyperparathyroidism, and CKD stage V s/p AV fistula placement 02/25/20, who is being followed by cardiology for the evaluation of chest pain and elevated troponins.  Attempted LHC 03/30/2020 but pt was unable to lie flat on arrival to cath lab. Case was canceled and nephrology will proceed with HD catheter.  Assessment & Plan    NSTEMI - presented with syncope x 2 and worsening DOE - also described pleuritic chest pain - hs troponin peaked at > 7000 - echo with EF 35-40%, RWMA, grade 2 DD and elevated PA pressure - LHC was planned for yesterday, but patient was unable to stay lie flat and case was canceled - continue heparin drip for now - will revisit utility of heart cath after he has diuresed with HD   Acute systolic and  diastolic heart failure Progressive DOE - echo with EF 35-40% - diuresis has been complicated by marginal pressure and CKD stage V with marginal urine output - patient agreed to start HD yesterday --> nephrology now managing diuretic regimen, appreciate assistance - strict I&Os and  daily weights   Syncope - 2 episodes - etiology unclear, but could be related to ischemia given NSTEMI and CHF vs poor PO intake - telemetry without significant arrhythmia   Hyperlipidemia with LDL goal < 70 - 03/17/2020: Cholesterol 173; HDL 64; LDL Cholesterol 91; Triglycerides 91; VLDL 18 - increase crestor from 20 mg to 40 mg   CKD stage V - AV fistula in place, but not useable yet - nephrology following - placing HD catheter with plans for HD - hopefully today      For questions or updates, please contact [facility] Please consult [email or web address] for contact info under        Signed, Ledora Bottcher, PA  03/19/2020, 8:33 AM

## 2020-03-19 NOTE — Progress Notes (Signed)
Pt returned to unit from IR, hep gtt already stopped, will restart 4hr after per Pharm.

## 2020-03-19 NOTE — Progress Notes (Signed)
ANTICOAGULATION CONSULT NOTE   Pharmacy Consult for heparin Indication: NSTEMI  No Known Allergies  Patient Measurements: Height: 5\' 11"  (180.3 cm) Weight: 66 kg (145 lb 8.1 oz) IBW/kg (Calculated) : 75.3 Heparin Dosing Weight: 63.5 kg  Vital Signs: Temp: 97.5 F (36.4 C) (10/13 0432) Temp Source: Oral (10/13 0418) BP: 104/72 (10/13 0432) Pulse Rate: 114 (10/13 0432)  Labs: Recent Labs    03/17/20 0344 03/17/20 0344 03/17/2020 0420 03/19/20 0434  HGB 9.6*   < > 9.8* 9.7*  HCT 31.6*  --  32.3* 31.3*  PLT PLATELET CLUMPS NOTED ON SMEAR, UNABLE TO ESTIMATE  --  PLATELET CLUMPS NOTED ON SMEAR, UNABLE TO ESTIMATE PLATELET CLUMPS NOTED ON SMEAR, UNABLE TO ESTIMATE  HEPARINUNFRC 0.40  --  0.22* 0.38  CREATININE 5.72*  --  6.35*  --    < > = values in this interval not displayed.    Estimated Creatinine Clearance: 7.9 mL/min (A) (by C-G formula based on SCr of 6.35 mg/dL (H)).  Assessment: 87 y.o. male with NSTEMI for heparin. Cath visit cancelled 10/12 with orthopnea. Heparin level therapeutic, H/H stable, pltc clumping.  Goal of Therapy:  Heparin level 0.3-0.7 units/ml Monitor platelets by anticoagulation protocol: Yes   Plan:  Continue heparin 1250 units/h Daily heparin level and CBC   Arrie Senate, PharmD, BCPS Clinical Pharmacist [phone] Please check AMION for all [place] numbers 03/19/2020

## 2020-03-19 NOTE — Progress Notes (Signed)
SLP Cancellation Note  Patient Details Name: COLTYN HANNING MRN: [id number] DOB: 27-Aug-1932   Cancelled treatment:       Reason Eval/Treat Not Completed: Other (comment) (RN indicated that pt is NPO for possible cath today. SLP will follow up on a subsequent date.)  Shani Fitch I. Hardin Negus, [place], [place] Office number [phone] Pager [place] 03/19/2020, 2:07 PM

## 2020-03-19 NOTE — Progress Notes (Signed)
Admit: 03/20/2020 LOS: 4  47M NSTEMI, CKD5 now with N?V, vol o/l, acidosis, SOB  Subjective:  . NAE overnight . IR to place [facility] today . He is in agreement with HD  10/12 0701 - 10/13 0700 In: 578.4 [P.O.:300; I.V.:151.5; IV Piggyback:126.9] Out: 970 [Urine:970]  Filed Weights   03/17/20 0437 03/25/2020 0530 03/19/20 0418  Weight: 67.1 kg 65.8 kg 66 kg    Scheduled Meds: . aspirin EC  81 mg Oral Daily  . Chlorhexidine Gluconate Cloth  6 each Topical Daily  . colchicine  0.6 mg Oral Once  . feeding supplement (NEPRO CARB STEADY)  237 mL Oral BID BM  . mouth rinse  15 mL Mouth Rinse BID  . metolazone  10 mg Oral Daily  . midodrine  5 mg Oral TID WC  . pantoprazole  40 mg Oral Daily  . rosuvastatin  20 mg Oral Daily  . sodium bicarbonate  650 mg Oral BID  . sodium chloride flush  3 mL Intravenous Q12H   Continuous Infusions: . furosemide 160 mg (03/19/20 0835)  . heparin 1,250 Units/hr (03/19/20 0649)   PRN Meds:.acetaminophen **OR** acetaminophen, morphine injection, ondansetron **OR** ondansetron (ZOFRAN) IV, traMADol  Current Labs: reviewed    Physical Exam:  Blood pressure 101/75, pulse (!) 108, temperature 98.5 F (36.9 C), temperature source Oral, resp. rate 20, height 5\' 11"  (1.803 m), weight 66 kg, SpO2 99 %. Thin, conversant, Tachypneic Tachy regular No sig LEE Nonfocal R RC AVF, thready Thrill  A 1. CKD5, now with uremia, N/V, vol o/l; starting HD. Is ESRD 2. NSTEMI, AoC sCHF; sig RWMA on TEE, Cards hopes for LHC but orthopnea limits procedure, re-visit after HD start 3. SOB / tachypneic from #2 and #4 4. Metabolic Acidosis prob from #1, on PO NaHCO3, will correct with HD 5. OA 6. BPH 7. Recent R RC AVF placed 9/20. Not mature  P . After sig deliberation pt to proceed with HD, IR to place HD cath today and start HD thereafter.  Gentle clearance, attempt 2-3L UF . Cont high dose diuretics for now . Palliative following . Cont NaHCO3 until HCO3  corrects . HD #2 tomorrow.   . Daily weights, Daily Renal Panel, Strict I/Os, Avoid nephrotoxins (NSAIDs, judicious IV Contrast)   Pearson Grippe MD 03/19/2020, 11:53 AM  Recent Labs  Lab 03/17/20 0344 03/19/2020 0420 03/19/20 0434  NA 134* 134* 131*  K 4.5 4.6 4.5  CL 105 104 101  CO2 13* 13* 12*  GLUCOSE 201* 220* 244*  BUN 87* 104* 120*  CREATININE 5.72* 6.35* 6.53*  CALCIUM 8.3* 8.4* 8.2*  PHOS  --   --  6.2*   Recent Labs  Lab 04/06/2020 1529 03/16/20 0521 03/17/20 0344 04/03/2020 0420 03/19/20 0434  WBC 7.4   < > 10.8* 7.8 8.4  NEUTROABS 6.3  --   --   --   --   HGB 10.6*   < > 9.6* 9.8* 9.7*  HCT 35.2*   < > 31.6* 32.3* 31.3*  MCV 104.5*   < > 101.0* 102.5* 100.0  PLT 171   < > PLATELET CLUMPS NOTED ON SMEAR, UNABLE TO ESTIMATE PLATELET CLUMPS NOTED ON SMEAR, UNABLE TO ESTIMATE PLATELET CLUMPS NOTED ON SMEAR, UNABLE TO ESTIMATE   < > = values in this interval not displayed.

## 2020-03-19 NOTE — Procedures (Signed)
Interventional Radiology Procedure Note  Procedure: Tunneled HD catheter placement  Complications: None  Estimated Blood Loss: < 10 mL  Findings: Right IJ, 23 cm tip to cuff length tunneled Palindrome catheter placed with tip in RA. OK to use.  Venetia Night. Kathlene Cote, M.D Pager:  [phone]

## 2020-03-20 ENCOUNTER — Inpatient Hospital Stay (HOSPITAL_COMMUNITY): Payer: Medicare HMO

## 2020-03-20 ENCOUNTER — Other Ambulatory Visit: Payer: Self-pay

## 2020-03-20 DIAGNOSIS — R570 Cardiogenic shock: Secondary | ICD-10-CM | POA: Diagnosis not present

## 2020-03-20 DIAGNOSIS — R778 Other specified abnormalities of plasma proteins: Secondary | ICD-10-CM | POA: Diagnosis not present

## 2020-03-20 DIAGNOSIS — N185 Chronic kidney disease, stage 5: Secondary | ICD-10-CM | POA: Diagnosis not present

## 2020-03-20 DIAGNOSIS — R601 Generalized edema: Secondary | ICD-10-CM | POA: Diagnosis not present

## 2020-03-20 LAB — LACTIC ACID, PLASMA: Lactic Acid, Venous: 1.8 mmol/L (ref 0.5–1.9)

## 2020-03-20 LAB — HEPATITIS B SURFACE ANTIBODY, QUANTITATIVE: Hep B S AB Quant (Post): 231.6 m[IU]/mL (ref >9.9)

## 2020-03-20 LAB — CBC
HCT: 30.2 % — ABNORMAL LOW (ref 39.0–52.0)
Hemoglobin: 9.5 g/dL — ABNORMAL LOW (ref 13.0–17.0)
MCH: 30.9 pg (ref 26.0–34.0)
MCHC: 31.5 g/dL (ref 30.0–36.0)
MCV: 98.4 fL (ref 80.0–100.0)
Platelets: UNDETERMINED 10*3/uL (ref 150–400)
RBC: 3.07 MIL/uL — ABNORMAL LOW (ref 4.22–5.81)
RDW: 15.3 % (ref 11.5–15.5)
WBC: 7.7 10*3/uL (ref 4.0–10.5)
nRBC: 0 % (ref 0.0–0.2)

## 2020-03-20 LAB — PROCALCITONIN
Procalcitonin: 3.33 ng/mL
Procalcitonin: 3.27 ng/mL

## 2020-03-20 LAB — HEPARIN LEVEL (UNFRACTIONATED)
Heparin Unfractionated: 0.25 IU/mL — ABNORMAL LOW (ref 0.30–0.70)
Heparin Unfractionated: 0.44 IU/mL (ref 0.30–0.70)

## 2020-03-20 MED ORDER — MIDODRINE HCL 5 MG PO TABS: 10.0000 mg | ORAL_TABLET | Freq: Three times a day (TID) | ORAL | Status: DC

## 2020-03-20 MED ORDER — VANCOMYCIN HCL 1500 MG/300ML IV SOLN
1500.0000 mg | Freq: Once | INTRAVENOUS | Status: AC
  Administered 2020-03-20: 1500 mg via INTRAVENOUS
  Filled 2020-03-20: qty 300

## 2020-03-20 MED ORDER — VANCOMYCIN HCL 500 MG/100ML IV SOLN
500.0000 mg | INTRAVENOUS | Status: AC
  Filled 2020-03-20: qty 100

## 2020-03-20 MED ORDER — SODIUM CHLORIDE 0.9 % IV BOLUS
250.0000 mL | Freq: Once | INTRAVENOUS | Status: AC
  Administered 2020-03-20: 250 mL via INTRAVENOUS

## 2020-03-20 MED ORDER — PIPERACILLIN-TAZOBACTAM IN DEX 2-0.25 GM/50ML IV SOLN
2.2500 g | Freq: Three times a day (TID) | INTRAVENOUS | Status: DC
  Administered 2020-03-20 – 2020-03-21 (×5): 2.25 g via INTRAVENOUS
  Filled 2020-03-20 (×7): qty 50

## 2020-03-20 MED ORDER — VANCOMYCIN VARIABLE DOSE PER UNSTABLE RENAL FUNCTION (PHARMACIST DOSING): Status: DC

## 2020-03-20 MED ORDER — ALTEPLASE 2 MG IJ SOLR
4.0000 mg | Freq: Once | INTRAMUSCULAR | Status: AC
  Administered 2020-03-20: 4 mg

## 2020-03-20 MED ORDER — HEPARIN SODIUM (PORCINE) 1000 UNIT/ML IJ SOLN
INTRAMUSCULAR | Status: AC
  Filled 2020-03-20: qty 4

## 2020-03-20 MED ORDER — MIDODRINE HCL 5 MG PO TABS
10.0000 mg | ORAL_TABLET | Freq: Three times a day (TID) | ORAL | Status: DC
  Administered 2020-03-20 – 2020-03-21 (×6): 10 mg via ORAL
  Filled 2020-03-20 (×7): qty 2

## 2020-03-20 MED ORDER — ALTEPLASE 2 MG IJ SOLR
INTRAMUSCULAR | Status: AC
  Filled 2020-03-20: qty 4

## 2020-03-20 MED ORDER — SODIUM CHLORIDE 0.9 % IV BOLUS: 250.0000 mL | Freq: Once | INTRAVENOUS | Status: DC

## 2020-03-20 MED ORDER — VANCOMYCIN HCL 500 MG/100ML IV SOLN
500.0000 mg | INTRAVENOUS | Status: DC
  Filled 2020-03-20: qty 100

## 2020-03-20 MED ORDER — VANCOMYCIN HCL IN DEXTROSE 500-5 MG/100ML-% IV SOLN
INTRAVENOUS | Status: AC
  Administered 2020-03-20: 500 mg
  Filled 2020-03-20: qty 100

## 2020-03-20 NOTE — Progress Notes (Signed)
PROGRESS NOTE    Richard Moses  UXN:[id number]  DOB: [birth year]  DOA: 03/27/2020 PCP: Carollee Leitz, MD Outpatient Specialists:   Hospital course:  86 year old gentleman with history of stage V chronic kidney disease, Foley catheter dependent BPH, diet-controlled diabetes, recent left forearm AV fistula in preparation for dialysis presented to the ER with increasing shortness of breath on exertion and at rest, passing out spell while walking in the mall, poor intake and unexplained weight loss for last 6 months. In the emergency room blood pressure was 98/59. Oxygenation 96% on room air.  VQ scan normal.  Initial troponin 7000.  BNP 4000.  Creatinine 6.  Hospital stay has been complicated by new onset atrial fibrillation which developed the night of 03/19/2020.   Subjective:  Patient's main complaint is how hot it is in the room.  Admits to feeling weak and tired.  Generally does not feel well.  Denies shortness of breath while he is sitting and at rest.   Objective: Vitals:   03/20/20 1400 03/20/20 1430 03/20/20 1445 03/20/20 1500  BP: (!) 91/54 (!) 87/59 (!) 94/59 100/63  Pulse:  91    Resp:  (!) 21    Temp:      TempSrc:      SpO2:  95%    Weight:      Height:        Intake/Output Summary (Last 24 hours) at 03/20/2020 1551 Last data filed at 03/20/2020 0253 Gross per 24 hour  Intake 493 ml  Output 734 ml  Net -241 ml   Filed Weights   03/19/20 1908 03/20/20 0510 03/20/20 1358  Weight: 65.5 kg 65.4 kg 65.9 kg     Exam:  General: Elderly man sitting up in bed looking tired. Eyes: sclera anicteric, conjuctiva mild injection bilaterally CVS: S1-S2, regular  Respiratory:  decreased air entry bilaterally secondary to decreased inspiratory effort, rales at bases  GI: NABS, soft, NT  LE: No edema.  Neuro: A/O x 3, Moving all extremities equally with normal strength, CN 3-12 intact, grossly nonfocal.  Psych: patient is logical and coherent, judgement and insight  appear normal, mood and affect appropriate to situation.   Assessment & Plan:   New onset atrial fibrillation with RVR complicated by hypotension Developed 03/19/2020 night after HD Discussed with cardiology, started on amiodarone and was converted back to sinus rhythm However amiodarone was also contributory to hypotension so it was discontinued. Lasix and metolazone were discontinued Patient was given small fluid bolus with some improvement in blood pressure Patient started on midodrine 10 3 times daily  Elevated procalcitonin No clear etiology, test x-ray is negative and abdomen is benign However patient does have Foley catheter in place chronically in urine always has clumps of WBC with many bacteria.  Patient started on empiric vancomycin and Zosyn antibiotics last night Blood cultures pending  NSTEMI Troponin peaked at 7000 on 03/22/2020 Unable to tolerate cardiac catheterization x2 secondary to inability to lay flat. Patient is on heparin and aspirin and rosuvastatin Hypotension precludes initiation of beta-blocker  Pulmonary edema on admission Likely combination ESRD and cardiac dysfunction during STEMI Patient was being diuresed with Lasix 160 every 6 and metolazone However is feeling better status post dialysis yesterday  CKD 5 Nephrology following, hemodialysis is warranted  DM2 Reasonably controlled on present regimen  Urinary retention Continue Foley catheter, last changed 04/01/2020   DVT prophylaxis: On heparin drip Code Status: Full Family Communication: We will speak with son Disposition Plan:  Patient is from: Home  Anticipated Discharge Location: TBD  Barriers to Discharge: New onset A. fib  Is patient medically stable for Discharge: No   Consultants:  Cardiology  Nephrology  Procedures:  Hemodialysis  Antimicrobials:  Vancomycin  Zosyn   Data Reviewed:  Basic Metabolic Panel: Recent Labs  Lab 04/03/2020 1529 03/16/20 0521  03/17/20 0344 04/06/2020 0420 03/19/20 0434  NA 134* 135 134* 134* 131*  K 4.7 4.4 4.5 4.6 4.5  CL 103 107 105 104 101  CO2 17* 15* 13* 13* 12*  GLUCOSE 202* 129* 201* 220* 244*  BUN 80* 82* 87* 104* 120*  CREATININE 6.27* 5.62* 5.72* 6.35* 6.53*  CALCIUM 8.9 8.5* 8.3* 8.4* 8.2*  PHOS  --   --   --   --  6.2*   Liver Function Tests: Recent Labs  Lab 03/26/2020 1529 03/16/20 0521 03/19/20 0434  AST 74* 64*  --   ALT 25 25  --   ALKPHOS 65 65  --   BILITOT 0.8 0.7  --   PROT 7.2 6.4*  --   ALBUMIN 3.6 3.3* 2.6*   Recent Labs  Lab 03/28/2020 1529  LIPASE 30   No results for input(s): AMMONIA in the last 168 hours. CBC: Recent Labs  Lab 03/24/2020 1529 03/30/2020 1529 03/16/20 0521 03/17/20 0344 03/29/2020 0420 03/19/20 0434 03/20/20 0204  WBC 7.4   < > 7.8 10.8* 7.8 8.4 7.7  NEUTROABS 6.3  --   --   --   --   --   --   HGB 10.6*   < > 9.9* 9.6* 9.8* 9.7* 9.5*  HCT 35.2*   < > 33.0* 31.6* 32.3* 31.3* 30.2*  MCV 104.5*   < > 103.4* 101.0* 102.5* 100.0 98.4  PLT 171   < > 127* PLATELET CLUMPS NOTED ON SMEAR, UNABLE TO ESTIMATE PLATELET CLUMPS NOTED ON SMEAR, UNABLE TO ESTIMATE PLATELET CLUMPS NOTED ON SMEAR, UNABLE TO ESTIMATE PLATELET CLUMPS NOTED ON SMEAR, UNABLE TO ESTIMATE   < > = values in this interval not displayed.   Cardiac Enzymes: No results for input(s): CKTOTAL, CKMB, CKMBINDEX, TROPONINI in the last 168 hours. BNP (last 3 results) No results for input(s): PROBNP in the last 8760 hours. CBG: Recent Labs  Lab 03/20/2020 1514  GLUCAP 170*    Recent Results (from the past 240 hour(s))  Respiratory Panel by RT PCR (Flu A&B, Covid) - Nasopharyngeal Swab     Status: None   Collection Time: 03/14/2020  3:29 PM   Specimen: Nasopharyngeal Swab  Result Value Ref Range Status   SARS Coronavirus 2 by RT PCR NEGATIVE NEGATIVE Final    Comment: (NOTE) SARS-CoV-2 target nucleic acids are NOT DETECTED.  The SARS-CoV-2 RNA is generally detectable in upper  respiratoy specimens during the acute phase of infection. The lowest concentration of SARS-CoV-2 viral copies this assay can detect is 131 copies/mL. A negative result does not preclude SARS-Cov-2 infection and should not be used as the sole basis for treatment or other patient management decisions. A negative result may occur with  improper specimen collection/handling, submission of specimen other than nasopharyngeal swab, presence of viral mutation(s) within the areas targeted by this assay, and inadequate number of viral copies (<131 copies/mL). A negative result must be combined with clinical observations, patient history, and epidemiological information. The expected result is Negative.  Fact Sheet for Patients:  PinkCheek.be  Fact Sheet for Healthcare Providers:  GravelBags.it  This test is no t yet approved or cleared  by the [place] and  has been authorized for detection and/or diagnosis of SARS-CoV-2 by FDA under an Emergency Use Authorization (EUA). This EUA will remain  in effect (meaning this test can be used) for the duration of the COVID-19 declaration under Section 564(b)(1) of the Act, 21 U.S.C. section 360bbb-3(b)(1), unless the authorization is terminated or revoked sooner.     Influenza A by PCR NEGATIVE NEGATIVE Final   Influenza B by PCR NEGATIVE NEGATIVE Final    Comment: (NOTE) The Xpert Xpress SARS-CoV-2/FLU/RSV assay is intended as an aid in  the diagnosis of influenza from Nasopharyngeal swab specimens and  should not be used as a sole basis for treatment. Nasal washings and  aspirates are unacceptable for Xpert Xpress SARS-CoV-2/FLU/RSV  testing.  Fact Sheet for Patients: PinkCheek.be  Fact Sheet for Healthcare Providers: GravelBags.it  This test is not yet approved or cleared by the [place] FDA and  has been  authorized for detection and/or diagnosis of SARS-CoV-2 by  FDA under an Emergency Use Authorization (EUA). This EUA will remain  in effect (meaning this test can be used) for the duration of the  Covid-19 declaration under Section 564(b)(1) of the Act, 21  U.S.C. section 360bbb-3(b)(1), unless the authorization is  terminated or revoked. Performed at [facility], [place] [address], [place], [place] [address]   Culture, blood (routine x 2)     Status: None (Preliminary result)   Collection Time: 03/20/20  5:53 AM   Specimen: BLOOD RIGHT HAND  Result Value Ref Range Status   Specimen Description BLOOD RIGHT HAND  Final   Special Requests   Final    BOTTLES DRAWN AEROBIC AND ANAEROBIC Blood Culture adequate volume   Culture   Final    NO GROWTH < 12 HOURS Performed at [facility], [place] [address], [place], [place] [address]    Report Status PENDING  Incomplete  Culture, blood (routine x 2)     Status: None (Preliminary result)   Collection Time: 03/20/20  6:07 AM   Specimen: BLOOD RIGHT ARM  Result Value Ref Range Status   Specimen Description BLOOD RIGHT ARM  Final   Special Requests   Final    BOTTLES DRAWN AEROBIC AND ANAEROBIC Blood Culture adequate volume   Culture   Final    NO GROWTH < 12 HOURS Performed at [facility], [place] [address], [place], [place] [address]    Report Status PENDING  Incomplete      Studies: IR Fluoro Guide CV Line Right  Result Date: 03/19/2020 CLINICAL DATA:  End-stage renal disease and need for tunneled hemodialysis catheter to start hemodialysis. EXAM: TUNNELED CENTRAL VENOUS HEMODIALYSIS CATHETER PLACEMENT WITH ULTRASOUND AND FLUOROSCOPIC GUIDANCE ANESTHESIA/SEDATION: 0.5 mg IV Versed; 25 mcg IV Fentanyl. Total Moderate Sedation Time:   21 minutes. The patient's level of consciousness and physiologic status were continuously monitored during the procedure by Radiology nursing. MEDICATIONS: 2 g IV Ancef. FLUOROSCOPY  TIME:  18 seconds.  1.0 mGy. PROCEDURE: The procedure, risks, benefits, and alternatives were explained to the patient. Questions regarding the procedure were encouraged and answered. The patient understands and consents to the procedure. A timeout was performed prior to initiating the procedure. The right neck and chest were prepped with chlorhexidine in a sterile fashion, and a sterile drape was applied covering the operative field. Maximum barrier sterile technique with sterile gowns and gloves were used for the procedure. Local anesthesia was provided with 1% lidocaine.  Ultrasound was used to confirm patency of the right internal jugular vein. After creating a small venotomy incision, a 21 gauge needle was advanced into the right internal jugular vein under direct, real-time ultrasound guidance. Ultrasound image documentation was performed. After securing guidewire access, an 8 Fr dilator was placed. A J-wire was kinked to measure appropriate catheter length. A Palindrome tunneled hemodialysis catheter measuring 23 cm from tip to cuff was chosen for placement. This was tunneled in a retrograde fashion from the chest wall to the venotomy incision. At the venotomy, serial dilatation was performed and a 15 Fr peel-away sheath was placed over a guidewire. The catheter was then placed through the sheath and the sheath removed. Final catheter positioning was confirmed and documented with a fluoroscopic spot image. The catheter was aspirated, flushed with saline, and injected with appropriate volume heparin dwells. The venotomy incision was closed with subcuticular 4-0 Vicryl. Dermabond was applied to the incision. The catheter exit site was secured with 0-Prolene retention sutures. COMPLICATIONS: None.  No pneumothorax. FINDINGS: After catheter placement, the tip lies in the right atrium. The catheter aspirates normally and is ready for immediate use. IMPRESSION: Placement of tunneled hemodialysis catheter via the  right internal jugular vein. The catheter tip lies in the right atrium. The catheter is ready for immediate use. Electronically Signed   By: Aletta Edouard M.D.   On: 03/19/2020 16:44   IR US Guide Vasc Access Right  Result Date: 03/19/2020 CLINICAL DATA:  End-stage renal disease and need for tunneled hemodialysis catheter to start hemodialysis. EXAM: TUNNELED CENTRAL VENOUS HEMODIALYSIS CATHETER PLACEMENT WITH ULTRASOUND AND FLUOROSCOPIC GUIDANCE ANESTHESIA/SEDATION: 0.5 mg IV Versed; 25 mcg IV Fentanyl. Total Moderate Sedation Time:   21 minutes. The patient's level of consciousness and physiologic status were continuously monitored during the procedure by Radiology nursing. MEDICATIONS: 2 g IV Ancef. FLUOROSCOPY TIME:  18 seconds.  1.0 mGy. PROCEDURE: The procedure, risks, benefits, and alternatives were explained to the patient. Questions regarding the procedure were encouraged and answered. The patient understands and consents to the procedure. A timeout was performed prior to initiating the procedure. The right neck and chest were prepped with chlorhexidine in a sterile fashion, and a sterile drape was applied covering the operative field. Maximum barrier sterile technique with sterile gowns and gloves were used for the procedure. Local anesthesia was provided with 1% lidocaine. Ultrasound was used to confirm patency of the right internal jugular vein. After creating a small venotomy incision, a 21 gauge needle was advanced into the right internal jugular vein under direct, real-time ultrasound guidance. Ultrasound image documentation was performed. After securing guidewire access, an 8 Fr dilator was placed. A J-wire was kinked to measure appropriate catheter length. A Palindrome tunneled hemodialysis catheter measuring 23 cm from tip to cuff was chosen for placement. This was tunneled in a retrograde fashion from the chest wall to the venotomy incision. At the venotomy, serial dilatation was performed  and a 15 Fr peel-away sheath was placed over a guidewire. The catheter was then placed through the sheath and the sheath removed. Final catheter positioning was confirmed and documented with a fluoroscopic spot image. The catheter was aspirated, flushed with saline, and injected with appropriate volume heparin dwells. The venotomy incision was closed with subcuticular 4-0 Vicryl. Dermabond was applied to the incision. The catheter exit site was secured with 0-Prolene retention sutures. COMPLICATIONS: None.  No pneumothorax. FINDINGS: After catheter placement, the tip lies in the right atrium. The catheter aspirates normally and is  ready for immediate use. IMPRESSION: Placement of tunneled hemodialysis catheter via the right internal jugular vein. The catheter tip lies in the right atrium. The catheter is ready for immediate use. Electronically Signed   By: Aletta Edouard M.D.   On: 03/19/2020 16:44   DG CHEST PORT 1 VIEW  Result Date: 03/20/2020 CLINICAL DATA:  Shortness of breath EXAM: PORTABLE CHEST 1 VIEW COMPARISON:  04/01/2020 FINDINGS: Mild cardiomegaly. Right chest wall catheter tip is in the proximal right atrium. Suspected veiling pleural effusions, small. No lobar consolidation. IMPRESSION: Mild cardiomegaly and small pleural effusions. Electronically Signed   By: Ulyses Jarred M.D.   On: 03/20/2020 02:01     Scheduled Meds: . alteplase  4 mg Intracatheter Once  . aspirin EC  81 mg Oral Daily  . Chlorhexidine Gluconate Cloth  6 each Topical Daily  . feeding supplement (NEPRO CARB STEADY)  237 mL Oral BID BM  . mouth rinse  15 mL Mouth Rinse BID  . midodrine  10 mg Oral TID WC  . pantoprazole  40 mg Oral Daily  . rosuvastatin  20 mg Oral Daily  . sodium bicarbonate  650 mg Oral BID  . sodium chloride flush  3 mL Intravenous Q12H  . vancomycin variable dose per unstable renal function (pharmacist dosing)   Does not apply See admin instructions   Continuous Infusions: . heparin 1,250  Units/hr (03/20/20 1256)  . piperacillin-tazobactam (ZOSYN)  IV 2.25 g (03/20/20 0503)  . vancomycin      Principal Problem:   Elevated troponin Active Problems:   Primary hypertension   Chronic kidney disease, stage V (HCC)   Weakness generalized   DNR (do not resuscitate) discussion   Anemia associated with stage 5 chronic renal failure (HCC)   Peripheral neuropathy   Secondary hyperparathyroidism (HCC)   Diarrhea   Peripheral artery disease (HCC)   Edema   Right-sided Bell's palsy   Syncope and collapse   Chest pain   Pleural effusion on right   Right AVF (arteriovenous fistula) (HCC)   NSTEMI (non-ST elevated myocardial infarction) ([place])   Cardiogenic shock ([place])   Palliative care by specialist     Vashti Hey, [facility]  If 7PM-7AM, please contact night-coverage [email or web address] Password [email or web address] 03/20/2020, 3:51 PM    LOS: 5 days

## 2020-03-20 NOTE — Progress Notes (Signed)
Admit: 04/01/2020 LOS: 5  81M NSTEMI, CKD5 now with N?V, vol o/l, acidosis, SOB  Subjective:   TDC and HD #1 overnight  Developed AFib/RVR and hypotension; limited UF  Diuretics held  Feels ok this AM  Started on Vanc / Zosyn because o acute issues, PCT up slightly  10/13 0701 - 10/14 0700 In: 493 [P.O.:240; I.V.:3; IV Piggyback:250] Out: 1434 [Urine:1050]  Filed Weights   03/19/20 0418 03/19/20 1908 03/20/20 0510  Weight: 66 kg 65.5 kg 65.4 kg    Scheduled Meds:  aspirin EC  81 mg Oral Daily   Chlorhexidine Gluconate Cloth  6 each Topical Daily   feeding supplement (NEPRO CARB STEADY)  237 mL Oral BID BM   mouth rinse  15 mL Mouth Rinse BID   midodrine  10 mg Oral TID WC   pantoprazole  40 mg Oral Daily   rosuvastatin  20 mg Oral Daily   sodium bicarbonate  650 mg Oral BID   sodium chloride flush  3 mL Intravenous Q12H   vancomycin variable dose per unstable renal function (pharmacist dosing)   Does not apply See admin instructions   Continuous Infusions:  heparin 1,250 Units/hr (03/19/20 2241)   piperacillin-tazobactam (ZOSYN)  IV 2.25 g (03/20/20 0503)   PRN Meds:.acetaminophen **OR** acetaminophen, ondansetron **OR** ondansetron (ZOFRAN) IV, traMADol  Current Labs: reviewed    Physical Exam:  Blood pressure 95/65, pulse 92, temperature 97.7 F (36.5 C), temperature source Oral, resp. rate (!) 24, height 5\' 11"  (1.803 m), weight 65.4 kg, SpO2 100 %. Thin, conversant, Tachypneic Tachy regular No sig LEE Nonfocal R RC AVF, thready Thrill  A 1. CKD5, now with uremia, N/V, vol o/l; starting HD. Is ESRD 1. Dickinson with IR R IJ placed 10/13 2. First HD 10/13, sig hypotension and limited UF with RVR 2. NSTEMI, AoC sCHF; sig RWMA on TEE, Cards hopes for LHC but orthopnea limits procedure, re-visit after HD start 3. SOB / tachypneic from #2 and #4 4. AFib with RVR, Cardiology following 5. Metabolic Acidosis prob from #1, on PO NaHCO3, will correct with  HD 6. OA 7. BPH 8. Recent R RC AVF placed 9/20. Not mature 9. CKD-BMD; P up slightly, trend with HD 10. Anemia, stable in 9-10s monitor for now  P  Holding diuretics  Palliative following  Cont NaHCO3 until HCO3 corrects with HD  HD #2 tomorrow.  3h, 300/500, 2L UF  Check PTH  Daily weights, Daily Renal Panel, Strict I/Os, Avoid nephrotoxins (NSAIDs, judicious IV Contrast)   Pearson Grippe MD 03/20/2020, 10:56 AM  Recent Labs  Lab 03/17/20 0344 03/30/2020 0420 03/19/20 0434  NA 134* 134* 131*  K 4.5 4.6 4.5  CL 105 104 101  CO2 13* 13* 12*  GLUCOSE 201* 220* 244*  BUN 87* 104* 120*  CREATININE 5.72* 6.35* 6.53*  CALCIUM 8.3* 8.4* 8.2*  PHOS  --   --  6.2*   Recent Labs  Lab 04/06/2020 1529 03/16/20 0521 03/10/2020 0420 03/19/20 0434 03/20/20 0204  WBC 7.4   < > 7.8 8.4 7.7  NEUTROABS 6.3  --   --   --   --   HGB 10.6*   < > 9.8* 9.7* 9.5*  HCT 35.2*   < > 32.3* 31.3* 30.2*  MCV 104.5*   < > 102.5* 100.0 98.4  PLT 171   < > PLATELET CLUMPS NOTED ON SMEAR, UNABLE TO ESTIMATE PLATELET CLUMPS NOTED ON SMEAR, UNABLE TO ESTIMATE PLATELET CLUMPS NOTED ON SMEAR, UNABLE TO  ESTIMATE   < > = values in this interval not displayed.

## 2020-03-20 NOTE — Progress Notes (Signed)
   03/19/20 2223  Assess: MEWS Score  Temp 97.9 F (36.6 C)  BP 92/64  Pulse Rate (!) 137  ECG Heart Rate (!) 137  Resp 18  SpO2 100 %  O2 Device Nasal Cannula  O2 Flow Rate (L/min) 2 L/min  Assess: MEWS Score  MEWS Temp 0  MEWS Systolic 1  MEWS Pulse 3  MEWS RR 0  MEWS LOC 0  MEWS Score 4  MEWS Score Color Red  Assess: if the MEWS score is Yellow or Red  Were vital signs taken at a resting state? Yes  Focused Assessment Change from prior assessment (see assessment flowsheet)  Early Detection of Sepsis Score *See Row Information* Low  MEWS guidelines implemented *See Row Information* Yes  Treat  MEWS Interventions Escalated (See documentation below)  Pain Scale 0-10  Pain Score 0  Patients Stated Pain Goal 0  Take Vital Signs  Increase Vital Sign Frequency  Red: Q 1hr X 4 then Q 4hr X 4, if remains red, continue Q 4hrs  Escalate  MEWS: Escalate Red: discuss with charge nurse/RN and provider, consider discussing with RRT  Notify: Charge Nurse/RN  Name of Charge Nurse/RN Notified Christina   Date Charge Nurse/RN Notified 03/19/20  Time Charge Nurse/RN Notified 2320  Notify: Provider  Provider Name/Title Dr. Marlowe Sax, on call with [facility]   Date Provider Notified 03/19/20  Time Provider Notified 2230  Notification Type Page  Notification Reason Change in status  Response See new orders  Date of Provider Response 03/19/20  Time of Provider Response 2303  Document  Patient Outcome Other (Comment) (continuing to monitor, following MD orders)     03/19/20 2223  Assess: MEWS Score  Temp 97.9 F (36.6 C)  BP 92/64  Pulse Rate (!) 137  ECG Heart Rate (!) 137  Resp 18  SpO2 100 %  O2 Device Nasal Cannula  O2 Flow Rate (L/min) 2 L/min  Assess: MEWS Score  MEWS Temp 0  MEWS Systolic 1  MEWS Pulse 3  MEWS RR 0  MEWS LOC 0  MEWS Score 4  MEWS Score Color Red  Assess: if the MEWS score is Yellow or Red  Were vital signs taken at a resting state? Yes  Focused  Assessment Change from prior assessment (see assessment flowsheet)  Early Detection of Sepsis Score *See Row Information* Low  MEWS guidelines implemented *See Row Information* Yes  Treat  MEWS Interventions Escalated (See documentation below)  Pain Scale 0-10  Pain Score 0  Patients Stated Pain Goal 0  Take Vital Signs  Increase Vital Sign Frequency  Red: Q 1hr X 4 then Q 4hr X 4, if remains red, continue Q 4hrs  Escalate  MEWS: Escalate Red: discuss with charge nurse/RN and provider, consider discussing with RRT  Notify: Charge Nurse/RN  Name of Charge Nurse/RN Notified Christina   Date Charge Nurse/RN Notified 03/19/20  Time Charge Nurse/RN Notified 2320  Notify: Provider  Provider Name/Title Dr. Marlowe Sax, on call with [facility]   Date Provider Notified 03/19/20  Time Provider Notified 2230  Notification Type Page  Notification Reason Change in status  Response See new orders  Date of Provider Response 03/19/20  Time of Provider Response 2303  Document  Patient Outcome Other (Comment) (continuing to monitor, following MD orders)

## 2020-03-20 NOTE — Progress Notes (Signed)
SLP Cancellation Note  Patient Details Name: Richard Moses MRN: [id number] DOB: 06-26-1932   Cancelled treatment:       Reason Eval/Treat Not Completed: Patient at procedure or test/unavailable (Pt off unit for HD. SLP will follow up on subsequent date. )  Debraann Livingstone I. Hardin Negus, [place], [place] Office number [phone] Pager [place] 03/20/2020, 2:45 PM

## 2020-03-20 NOTE — Care Management Important Message (Signed)
Important Message  Patient Details  Name: Richard Moses MRN: [id number] Date of Birth: 1932/11/04   Medicare Important Message Given:  Yes     [name] 03/20/2020, 9:31 AM

## 2020-03-20 NOTE — Progress Notes (Signed)
Pharmacy Antibiotic Note  Richard Moses is a 86 y.o. male admitted on 03/17/2020 with NSTEMI, now w/ concern for sepsis.  Pharmacy has been consulted for vancomycin and Zosyn dosing.  Of note pt will be a new-start HD.  Plan: Vancomycin 1500mg  IV x1 then monitor HD tolerance for further doses (current plan for 2-3L).  Goal pre-HD level 15-25 mcg/mL. Zosyn 2.25g IV Q8H.  Height: 5\' 11"  (180.3 cm) Weight: 65.5 kg (144 lb 6.4 oz) IBW/kg (Calculated) : 75.3  Temp (24hrs), Avg:97.8 F (36.6 C), Min:97.5 F (36.4 C), Max:98.5 F (36.9 C)  Recent Labs  Lab 03/23/2020 1529 04/01/2020 1529 03/16/20 0521 03/17/20 0344 03/17/2020 0420 03/19/20 0434 03/20/20 0204 03/20/20 0308  WBC 7.4   < > 7.8 10.8* 7.8 8.4 7.7  --   CREATININE 6.27*  --  5.62* 5.72* 6.35* 6.53*  --   --   LATICACIDVEN  --   --   --   --   --   --   --  1.8   < > = values in this interval not displayed.    Estimated Creatinine Clearance: 7.7 mL/min (A) (by C-G formula based on SCr of 6.53 mg/dL (H)).    No Known Allergies   Thank you for allowing pharmacy to be a part of this patients care.  Wynona Neat, PharmD, BCPS  03/20/2020 4:06 AM

## 2020-03-20 NOTE — Progress Notes (Signed)
Progress Note  Patient Name: Richard Moses Date of Encounter: 03/20/2020  [facility] Cardiologist: Skeet Latch, MD   Subjective   Pt feels very weak, no energy and no appetite. No pain anywhere.  Inpatient Medications    Scheduled Meds: . aspirin EC  81 mg Oral Daily  . Chlorhexidine Gluconate Cloth  6 each Topical Daily  . feeding supplement (NEPRO CARB STEADY)  237 mL Oral BID BM  . mouth rinse  15 mL Mouth Rinse BID  . midodrine  10 mg Oral TID WC  . pantoprazole  40 mg Oral Daily  . rosuvastatin  20 mg Oral Daily  . sodium bicarbonate  650 mg Oral BID  . sodium chloride flush  3 mL Intravenous Q12H  . vancomycin variable dose per unstable renal function (pharmacist dosing)   Does not apply See admin instructions   Continuous Infusions: . heparin 1,250 Units/hr (03/19/20 2241)  . piperacillin-tazobactam (ZOSYN)  IV 2.25 g (03/20/20 0503)   PRN Meds: acetaminophen **OR** acetaminophen, ondansetron **OR** ondansetron (ZOFRAN) IV, traMADol   Vital Signs    Vitals:   03/20/20 0348 03/20/20 0448 03/20/20 0507 03/20/20 0510  BP: 98/65 95/65    Pulse: 94 92    Resp: (!) 21 (!) 24    Temp:   97.7 F (36.5 C)   TempSrc:   Oral   SpO2: 97% 100%    Weight:    65.4 kg  Height:        Intake/Output Summary (Last 24 hours) at 03/20/2020 2703 Last data filed at 03/20/2020 0253 Gross per 24 hour  Intake 493 ml  Output 1434 ml  Net -941 ml   Last 3 Weights 03/20/2020 03/19/2020 03/19/2020  Weight (lbs) 144 lb 2.9 oz 144 lb 6.4 oz 145 lb 8.1 oz  Weight (kg) 65.4 kg 65.5 kg 66 kg      Telemetry    Afib RVR overnight, unclear when it started, converted to NSR at approximately 2220 after amio - Personally Reviewed  ECG    Sinus rhythm HR 90 ST depression lateral leads - Personally Reviewed  Physical Exam   GEN: elderly male in no acute distress.   Neck: No JVD Cardiac: RRR, no murmurs, rubs, or gallops.  Respiratory: Clear to auscultation  bilaterally. GI: Soft, nontender, non-distended  MS: 1+ B LE edema; No deformity. Neuro:  Nonfocal  Psych: Normal affect   Labs    High Sensitivity Troponin:   Recent Labs  Lab 03/13/2020 1529 03/29/2020 1729  TROPONINIHS 7,391* 7,160*      Chemistry Recent Labs  Lab 03/11/2020 1529 03/22/2020 1529 03/16/20 0521 03/16/20 0521 03/17/20 0344 03/27/2020 0420 03/19/20 0434  NA 134*   < > 135   < > 134* 134* 131*  K 4.7   < > 4.4   < > 4.5 4.6 4.5  CL 103   < > 107   < > 105 104 101  CO2 17*   < > 15*   < > 13* 13* 12*  GLUCOSE 202*   < > 129*   < > 201* 220* 244*  BUN 80*   < > 82*   < > 87* 104* 120*  CREATININE 6.27*   < > 5.62*   < > 5.72* 6.35* 6.53*  CALCIUM 8.9   < > 8.5*   < > 8.3* 8.4* 8.2*  PROT 7.2  --  6.4*  --   --   --   --   ALBUMIN  3.6  --  3.3*  --   --   --  2.6*  AST 74*  --  64*  --   --   --   --   ALT 25  --  25  --   --   --   --   ALKPHOS 65  --  65  --   --   --   --   BILITOT 0.8  --  0.7  --   --   --   --   GFRNONAA 7*   < > 8*   < > 8* 7* 7*  ANIONGAP 14   < > 13   < > 16* 17* 18*   < > = values in this interval not displayed.     Hematology Recent Labs  Lab 03/12/2020 0420 03/19/20 0434 03/20/20 0204  WBC 7.8 8.4 7.7  RBC 3.15* 3.13* 3.07*  HGB 9.8* 9.7* 9.5*  HCT 32.3* 31.3* 30.2*  MCV 102.5* 100.0 98.4  MCH 31.1 31.0 30.9  MCHC 30.3 31.0 31.5  RDW 15.6* 15.2 15.3  PLT PLATELET CLUMPS NOTED ON SMEAR, UNABLE TO ESTIMATE PLATELET CLUMPS NOTED ON SMEAR, UNABLE TO ESTIMATE PLATELET CLUMPS NOTED ON SMEAR, UNABLE TO ESTIMATE    BNP Recent Labs  Lab 04/02/2020 1530  BNP 4,445.6*     DDimer No results for input(s): DDIMER in the last 168 hours.   Radiology    CARDIAC CATHETERIZATION  Result Date: 04/04/2020 The procedure was canceled because the patient was unable to lie flat.  IR Fluoro Guide CV Line Right  Result Date: 03/19/2020 CLINICAL DATA:  End-stage renal disease and need for tunneled hemodialysis catheter to start  hemodialysis. EXAM: TUNNELED CENTRAL VENOUS HEMODIALYSIS CATHETER PLACEMENT WITH ULTRASOUND AND FLUOROSCOPIC GUIDANCE ANESTHESIA/SEDATION: 0.5 mg IV Versed; 25 mcg IV Fentanyl. Total Moderate Sedation Time:   21 minutes. The patient's level of consciousness and physiologic status were continuously monitored during the procedure by Radiology nursing. MEDICATIONS: 2 g IV Ancef. FLUOROSCOPY TIME:  18 seconds.  1.0 mGy. PROCEDURE: The procedure, risks, benefits, and alternatives were explained to the patient. Questions regarding the procedure were encouraged and answered. The patient understands and consents to the procedure. A timeout was performed prior to initiating the procedure. The right neck and chest were prepped with chlorhexidine in a sterile fashion, and a sterile drape was applied covering the operative field. Maximum barrier sterile technique with sterile gowns and gloves were used for the procedure. Local anesthesia was provided with 1% lidocaine. Ultrasound was used to confirm patency of the right internal jugular vein. After creating a small venotomy incision, a 21 gauge needle was advanced into the right internal jugular vein under direct, real-time ultrasound guidance. Ultrasound image documentation was performed. After securing guidewire access, an 8 Fr dilator was placed. A J-wire was kinked to measure appropriate catheter length. A Palindrome tunneled hemodialysis catheter measuring 23 cm from tip to cuff was chosen for placement. This was tunneled in a retrograde fashion from the chest wall to the venotomy incision. At the venotomy, serial dilatation was performed and a 15 Fr peel-away sheath was placed over a guidewire. The catheter was then placed through the sheath and the sheath removed. Final catheter positioning was confirmed and documented with a fluoroscopic spot image. The catheter was aspirated, flushed with saline, and injected with appropriate volume heparin dwells. The venotomy  incision was closed with subcuticular 4-0 Vicryl. Dermabond was applied to the incision. The catheter exit site was secured  with 0-Prolene retention sutures. COMPLICATIONS: None.  No pneumothorax. FINDINGS: After catheter placement, the tip lies in the right atrium. The catheter aspirates normally and is ready for immediate use. IMPRESSION: Placement of tunneled hemodialysis catheter via the right internal jugular vein. The catheter tip lies in the right atrium. The catheter is ready for immediate use. Electronically Signed   By: Aletta Edouard M.D.   On: 03/19/2020 16:44   IR US Guide Vasc Access Right  Result Date: 03/19/2020 CLINICAL DATA:  End-stage renal disease and need for tunneled hemodialysis catheter to start hemodialysis. EXAM: TUNNELED CENTRAL VENOUS HEMODIALYSIS CATHETER PLACEMENT WITH ULTRASOUND AND FLUOROSCOPIC GUIDANCE ANESTHESIA/SEDATION: 0.5 mg IV Versed; 25 mcg IV Fentanyl. Total Moderate Sedation Time:   21 minutes. The patient's level of consciousness and physiologic status were continuously monitored during the procedure by Radiology nursing. MEDICATIONS: 2 g IV Ancef. FLUOROSCOPY TIME:  18 seconds.  1.0 mGy. PROCEDURE: The procedure, risks, benefits, and alternatives were explained to the patient. Questions regarding the procedure were encouraged and answered. The patient understands and consents to the procedure. A timeout was performed prior to initiating the procedure. The right neck and chest were prepped with chlorhexidine in a sterile fashion, and a sterile drape was applied covering the operative field. Maximum barrier sterile technique with sterile gowns and gloves were used for the procedure. Local anesthesia was provided with 1% lidocaine. Ultrasound was used to confirm patency of the right internal jugular vein. After creating a small venotomy incision, a 21 gauge needle was advanced into the right internal jugular vein under direct, real-time ultrasound guidance. Ultrasound  image documentation was performed. After securing guidewire access, an 8 Fr dilator was placed. A J-wire was kinked to measure appropriate catheter length. A Palindrome tunneled hemodialysis catheter measuring 23 cm from tip to cuff was chosen for placement. This was tunneled in a retrograde fashion from the chest wall to the venotomy incision. At the venotomy, serial dilatation was performed and a 15 Fr peel-away sheath was placed over a guidewire. The catheter was then placed through the sheath and the sheath removed. Final catheter positioning was confirmed and documented with a fluoroscopic spot image. The catheter was aspirated, flushed with saline, and injected with appropriate volume heparin dwells. The venotomy incision was closed with subcuticular 4-0 Vicryl. Dermabond was applied to the incision. The catheter exit site was secured with 0-Prolene retention sutures. COMPLICATIONS: None.  No pneumothorax. FINDINGS: After catheter placement, the tip lies in the right atrium. The catheter aspirates normally and is ready for immediate use. IMPRESSION: Placement of tunneled hemodialysis catheter via the right internal jugular vein. The catheter tip lies in the right atrium. The catheter is ready for immediate use. Electronically Signed   By: Aletta Edouard M.D.   On: 03/19/2020 16:44   DG CHEST PORT 1 VIEW  Result Date: 03/20/2020 CLINICAL DATA:  Shortness of breath EXAM: PORTABLE CHEST 1 VIEW COMPARISON:  03/19/2020 FINDINGS: Mild cardiomegaly. Right chest wall catheter tip is in the proximal right atrium. Suspected veiling pleural effusions, small. No lobar consolidation. IMPRESSION: Mild cardiomegaly and small pleural effusions. Electronically Signed   By: Ulyses Jarred M.D.   On: 03/20/2020 02:01    Cardiac Studies   Echo 03/16/20: 1. Severe anteroseptal and basal to mid anterior hypokinesis. . Left  ventricular ejection fraction, by estimation, is 35 to 40%. The left  ventricle has moderately  decreased function. The left ventricle  demonstrates regional wall motion abnormalities  (see scoring diagram/findings for description). There  is mild left  ventricular hypertrophy of the basal-septal segment. Left ventricular  diastolic parameters are consistent with Grade II diastolic dysfunction  (pseudonormalization). Elevated left  ventricular end-diastolic pressure.  2. Right ventricular systolic function is normal. The right ventricular  size is normal. There is normal pulmonary artery systolic pressure.  3. Left atrial size was mildly dilated.  4. The mitral valve is normal in structure. Trivial mitral valve  regurgitation. No evidence of mitral stenosis. Moderate mitral annular  calcification.  5. The aortic valve was not well visualized. Aortic valve regurgitation  is not visualized. Mild aortic valve stenosis. Aortic valve mean gradient  measures 16.0 mmHg. Aortic valve Vmax measures 2.42 m/s.  6. The inferior vena cava is dilated in size with <50% respiratory  variability, suggesting right atrial pressure of 15 mmHg.   Patient Profile     86 y.o. male with PMH of HLD, DM type 2, hypothyroidism, secondary hyperparathyroidism, and CKD stage V s/p AV fistula placement 02/25/20, who is being followed by cardiology for the evaluation of chest pain and elevated troponins.  Attempted LHC 03/27/2020 but pt was unable to lie flat on arrival to cath lab. Case was canceled and nephrology will proceed with HD catheter and HD.  Pt was dialyzed yesterday, but unfortunately converted to Afib RVR with hypotension. Amiodarone bolus-->infusion started and he converted to sinus rhythm at 2220 but continued to have hypotension.  Assessment & Plan    NSTEMI - presented with syncope x 2 and worsening DOE, pleuritic chest pain - hs troponin > 7000 - echo with EF 35-40% and RWMA, grade 2 DD, and elevated PA pressure - attempted heart cath, but could not lie flat - pt would like to move  forward with HD so that he can proceed with heart cath and have relief of his chest pain - continue heparin drip   Acute systolic and diastolic hear failure Progressive DOE - echo eith EF 35-40% - diuresis has been complicated by marginal pressure and CKD stage V --> HD   Afib RVR - following HD - converted to Afib RVR after HD last night with hypotension - converted with amiodarone bolus and infusion at approximately 2220 - pt continued to have hypotension and received a 250 cc fluid bolus, held lasix and metolazone - BP improved with IVF - procalcitonin was elevated and pt was started on ABX - continue heparin drip for stroke PPX --> This patients CHA2DS2-VASc Score and unadjusted Ischemic Stroke Rate (% per year) is equal to 7.2 % stroke rate/year from a score of 5 (2age, CHF, PAD, DM)   Syncope - 2 episodes prior to admission - he has many reasons to have syncope, including ischemia, CHF, poor PO intake, and PAF   Hyperlipidemia with LDL goal < 70 03/17/2020: Cholesterol 173; HDL 64; LDL Cholesterol 91; Triglycerides 91; VLDL 18 - increase crestor form 20 mg to 40 mg   CKD stage V --> now ESRD on HD - nephrology following, HD yesterday  - defer volume management to HD/nephrology - sounds like nephrology planning for additional HD today      For questions or updates, please contact [facility] Please consult [email or web address] for contact info under        Signed, Ledora Bottcher, PA  03/20/2020, 9:22 AM

## 2020-03-20 NOTE — Progress Notes (Signed)
IV amiodarone bolus infused.  Patient complaining of shortness of breath.  Blood pressure low.  Patient converted to normal sinus rhythm post bolus.  EKG confirmed.  [facility] paged.  Dr. Marlowe Sax (on call for [facility]) came to bedside to assess patient, order received for 240mL normal saline bolus.

## 2020-03-20 NOTE — Progress Notes (Signed)
Physical Therapy Treatment Patient Details Name: Richard Moses MRN: [id number] DOB: 16-Jun-1932 Today's Date: 03/20/2020    History of Present Illness 86yo male presenting with recent recurrent syncopal events, pleuritic chest pain, and increasing SOB/DOE. Also with recent unintentional weight loss. CTH and CXR clear, per imaging report also low risk for PE. High sensitivity troponins found to be >7000. Admitted wtih concerns of elevated troponins and potential NSTEMI. PMH knee pain, HLD, permanent foley cath, HTN, DM, CKD, CA    PT Comments    Pt admitted with above diagnosis. Pt was able to come to EOB and to transfer to chair with min to mod assist of 2 persons.  Could not progress ambulation as pt "felt weak."  Did progress by getting OOB to chair and looked good sitting up in chair. VSS.  Pt currently with functional limitations due to the deficits listed below (see PT Problem List). Pt will benefit from skilled PT to increase their independence and safety with mobility to allow discharge to the venue listed below.     Follow Up Recommendations  SNF;Supervision/Assistance - 24 hour;Other (comment) (versus home with hospice)     Equipment Recommendations  Rolling walker with 5" wheels;3in1 (PT)    Recommendations for Other Services       Precautions / Restrictions Precautions Precautions: Fall Precaution Comments: watch HR/O2/BP Restrictions Weight Bearing Restrictions: No    Mobility  Bed Mobility Overal bed mobility: Needs Assistance Bed Mobility: Supine to Sit;Sit to Supine Rolling: Mod assist   Supine to sit: Mod assist;+2 for physical assistance     General bed mobility comments: pt able to initiate LE off bed ultimately requires increased cuing for reaching to bedrail and modAx2 to come to EoB.  Transfers Overall transfer level: Needs assistance Equipment used: Rolling walker (2 wheeled) Transfers: Sit to/from [facility] Sit to Stand: Mod  assist;+2 physical assistance;From elevated surface Stand pivot transfers: Min assist;+2 physical assistance;From elevated surface       General transfer comment: Pt able to stand to RW and take pivotal steps to recliner from the bed. Keeps knees flexed and states, " I am so weak."   Ambulation/Gait             General Gait Details: Unable today due to low tolerance of activity    Stairs             Wheelchair Mobility    Modified Rankin (Stroke Patients Only)       Balance Overall balance assessment: Needs assistance Sitting-balance support: Feet supported;No upper extremity supported Sitting balance-Leahy Scale: Fair Sitting balance - Comments: sat EoB for 5 minutes and able to perform limited exercises   Standing balance support: Bilateral upper extremity supported;During functional activity Standing balance-Leahy Scale: Poor Standing balance comment: relies on UE support and external support                            Cognition Arousal/Alertness: Awake/alert Behavior During Therapy: WFL for tasks assessed/performed Overall Cognitive Status: No family/caregiver present to determine baseline cognitive functioning                                 General Comments: most likely close to baseline; appropriate for functional tasks      Exercises General Exercises - Lower Extremity Long Arc Quad: AROM;Both;Seated Hip Flexion/Marching: AROM;Both;Seated    General Comments General comments (  skin integrity, edema, etc.): 96 bpm, 100% on 2LO2, 94% RA with nurse agreeing for O2 to be removed.  96/66 pre BP, sitting BP 87/58 and 90/57 at end of session.        Pertinent Vitals/Pain Pain Assessment: Faces Faces Pain Scale: Hurts little more Pain Location: generalized discomfort Pain Descriptors / Indicators: Discomfort Pain Intervention(s): Limited activity within patient's tolerance;Monitored during session;Repositioned    Home Living                       Prior Function            PT Goals (current goals can now be found in the care plan section) Acute Rehab PT Goals Patient Stated Goal: to make a decision regarding dialysis Progress towards PT goals: Progressing toward goals    Frequency    Min 3X/week      PT Plan Current plan remains appropriate    Co-evaluation              AM-PAC PT "6 Clicks" Mobility   Outcome Measure  Help needed turning from your back to your side while in a flat bed without using bedrails?: A Lot Help needed moving from lying on your back to sitting on the side of a flat bed without using bedrails?: A Lot Help needed moving to and from a bed to a chair (including a wheelchair)?: A Lot Help needed standing up from a chair using your arms (e.g., wheelchair or bedside chair)?: A Lot Help needed to walk in hospital room?: Total Help needed climbing 3-5 steps with a railing? : Total 6 Click Score: 10    End of Session Equipment Utilized During Treatment: Oxygen;Gait belt Activity Tolerance: Patient limited by fatigue Patient left: with call bell/phone within reach;in chair;with chair alarm set Nurse Communication: Mobility status (notified nurse of O2 removal as sats 94% on RA) PT Visit Diagnosis: Muscle weakness (generalized) (M62.81);Difficulty in walking, not elsewhere classified (R26.2);Unsteadiness on feet (R26.81)     Time: 5747-3403 PT Time Calculation (min) (ACUTE ONLY): 18 min  Charges:  $Therapeutic Activity: 8-22 mins                     Gilliam Hawkes W,PT Acute Rehabilitation Services Pager:  [phone]  Office:  [place] 03/20/2020, 2:08 PM

## 2020-03-20 NOTE — Progress Notes (Signed)
Pharmacy Antibiotic Note  Richard Moses is a 86 y.o. male admitted on 03/14/2020 with NSTEMI, now w/ concern for sepsis.  Pharmacy has been consulted for vancomycin and Zosyn dosing.  Pt has had progressive CKD V this admit now with new iHD. First iHD was 10/13, now with second session planned for today. Pt received adequate vancomycin load so will schedule maintenance dose. Pt wt is borderline for the reduced 500mg  qHD dose (65 kg) and is planned to receive an abbreviated iHD session (2.5h) so will give 500mg  x1.  Plan: Vancomycin 500mg  IV x1 Zosyn 2.25g IV q8h  Height: 5\' 11"  (180.3 cm) Weight: 65.4 kg (144 lb 2.9 oz) IBW/kg (Calculated) : 75.3  Temp (24hrs), Avg:97.7 F (36.5 C), Min:97.5 F (36.4 C), Max:98 F (36.7 C)  Recent Labs  Lab 03/19/2020 1529 04/02/2020 1529 03/16/20 0521 03/17/20 0344 03/08/2020 0420 03/19/20 0434 03/20/20 0204 03/20/20 0308  WBC 7.4   < > 7.8 10.8* 7.8 8.4 7.7  --   CREATININE 6.27*  --  5.62* 5.72* 6.35* 6.53*  --   --   LATICACIDVEN  --   --   --   --   --   --   --  1.8   < > = values in this interval not displayed.    Estimated Creatinine Clearance: 7.7 mL/min (A) (by C-G formula based on SCr of 6.53 mg/dL (H)).    No Known Allergies   Thank you for allowing pharmacy to be a part of this patient's care.  Arrie Senate, PharmD, BCPS Clinical Pharmacist [phone] Please check AMION for all [place] numbers 03/20/2020

## 2020-03-20 NOTE — Progress Notes (Signed)
ANTICOAGULATION CONSULT NOTE   Pharmacy Consult for heparin Indication: NSTEMI  No Known Allergies  Patient Measurements: Height: 5\' 11"  (180.3 cm) Weight: 65.4 kg (144 lb 2.9 oz) IBW/kg (Calculated) : 75.3 Heparin Dosing Weight: 63.5 kg  Vital Signs: Temp: 97.7 F (36.5 C) (10/14 0507) Temp Source: Oral (10/14 0507) BP: 95/65 (10/14 0448) Pulse Rate: 92 (10/14 0448)  Labs: Recent Labs    03/29/2020 0420 03/23/2020 0420 03/19/20 0434 03/20/20 0204 03/20/20 0927  HGB 9.8*   < > 9.7* 9.5*  --   HCT 32.3*  --  31.3* 30.2*  --   PLT PLATELET CLUMPS NOTED ON SMEAR, UNABLE TO ESTIMATE  --  PLATELET CLUMPS NOTED ON SMEAR, UNABLE TO ESTIMATE PLATELET CLUMPS NOTED ON SMEAR, UNABLE TO ESTIMATE  --   HEPARINUNFRC 0.22*   < > 0.38 0.25* 0.44  CREATININE 6.35*  --  6.53*  --   --    < > = values in this interval not displayed.    Estimated Creatinine Clearance: 7.7 mL/min (A) (by C-G formula based on SCr of 6.53 mg/dL (H)).  Assessment: 86 y.o. male with NSTEMI for heparin. Cath visit cancelled 10/12 with orthopnea. Pt also developed AFib RVR with CHADSVASc 5. Continuing heparin until cath completed. Heparin level therapeutic and CBC stable.  Goal of Therapy:  Heparin level 0.3-0.7 units/ml Monitor platelets by anticoagulation protocol: Yes   Plan:  Continue heparin 1250 units/h Daily heparin level and CBC   Arrie Senate, PharmD, BCPS Clinical Pharmacist [phone] Please check AMION for all [facility] Pharmacy numbers 03/20/2020

## 2020-03-21 DIAGNOSIS — R570 Cardiogenic shock: Secondary | ICD-10-CM | POA: Diagnosis not present

## 2020-03-21 DIAGNOSIS — Z7189 Other specified counseling: Secondary | ICD-10-CM | POA: Diagnosis not present

## 2020-03-21 DIAGNOSIS — I770 Arteriovenous fistula, acquired: Secondary | ICD-10-CM | POA: Diagnosis not present

## 2020-03-21 DIAGNOSIS — N185 Chronic kidney disease, stage 5: Secondary | ICD-10-CM | POA: Diagnosis not present

## 2020-03-21 DIAGNOSIS — R778 Other specified abnormalities of plasma proteins: Secondary | ICD-10-CM | POA: Diagnosis not present

## 2020-03-21 DIAGNOSIS — D631 Anemia in chronic kidney disease: Secondary | ICD-10-CM | POA: Diagnosis not present

## 2020-03-21 DIAGNOSIS — Z515 Encounter for palliative care: Secondary | ICD-10-CM | POA: Diagnosis not present

## 2020-03-21 LAB — GLUCOSE, CAPILLARY
Glucose-Capillary: 110 mg/dL — ABNORMAL HIGH (ref 70–99)
Glucose-Capillary: 133 mg/dL — ABNORMAL HIGH (ref 70–99)

## 2020-03-21 LAB — CBC
HCT: 29.3 % — ABNORMAL LOW (ref 39.0–52.0)
Hemoglobin: 9.3 g/dL — ABNORMAL LOW (ref 13.0–17.0)
MCH: 30.8 pg (ref 26.0–34.0)
MCHC: 31.7 g/dL (ref 30.0–36.0)
MCV: 97 fL (ref 80.0–100.0)
Platelets: 81 10*3/uL — ABNORMAL LOW (ref 150–400)
RBC: 3.02 MIL/uL — ABNORMAL LOW (ref 4.22–5.81)
RDW: 15.3 % (ref 11.5–15.5)
WBC: 7.9 10*3/uL (ref 4.0–10.5)
nRBC: 0 % (ref 0.0–0.2)

## 2020-03-21 LAB — RENAL FUNCTION PANEL
Albumin: 2.2 g/dL — ABNORMAL LOW (ref 3.5–5.0)
Anion gap: 16 — ABNORMAL HIGH (ref 5–15)
BUN: 68 mg/dL — ABNORMAL HIGH (ref 8–23)
CO2: 19 mmol/L — ABNORMAL LOW (ref 22–32)
Calcium: 7.4 mg/dL — ABNORMAL LOW (ref 8.9–10.3)
Chloride: 97 mmol/L — ABNORMAL LOW (ref 98–111)
Creatinine, Ser: 4.89 mg/dL — ABNORMAL HIGH (ref 0.61–1.24)
GFR, Estimated: 10 mL/min — ABNORMAL LOW (ref >60)
Glucose, Bld: 139 mg/dL — ABNORMAL HIGH (ref 70–99)
Phosphorus: 7.7 mg/dL — ABNORMAL HIGH (ref 2.5–4.6)
Potassium: 4.5 mmol/L (ref 3.5–5.1)
Sodium: 132 mmol/L — ABNORMAL LOW (ref 135–145)

## 2020-03-21 LAB — HEPARIN LEVEL (UNFRACTIONATED)
Heparin Unfractionated: 0.8 IU/mL — ABNORMAL HIGH (ref 0.30–0.70)
Heparin Unfractionated: 0.87 IU/mL — ABNORMAL HIGH (ref 0.30–0.70)

## 2020-03-21 LAB — PTH, INTACT AND CALCIUM
Calcium, Total (PTH): 7.1 mg/dL — ABNORMAL LOW (ref 8.6–10.2)
PTH: 151 pg/mL — ABNORMAL HIGH (ref 15–65)

## 2020-03-21 MED ORDER — SODIUM CHLORIDE 0.9 % IV SOLN
1.0000 mg/h | INTRAVENOUS | Status: DC
  Administered 2020-03-21: 1 mg/h via INTRAVENOUS
  Filled 2020-03-21: qty 2.5

## 2020-03-21 MED ORDER — LIDOCAINE-PRILOCAINE 2.5-2.5 % EX CREA: 1.0000 "application " | TOPICAL_CREAM | CUTANEOUS | Status: DC | PRN

## 2020-03-21 MED ORDER — ALTEPLASE 2 MG IJ SOLR: 2.0000 mg | Freq: Once | INTRAMUSCULAR | Status: DC | PRN

## 2020-03-21 MED ORDER — BIOTENE DRY MOUTH MT LIQD: 15.0000 mL | OROMUCOSAL | Status: DC | PRN

## 2020-03-21 MED ORDER — HEPARIN SODIUM (PORCINE) 1000 UNIT/ML IJ SOLN
INTRAMUSCULAR | Status: AC
  Administered 2020-03-21: 1000 [IU]
  Filled 2020-03-21: qty 4

## 2020-03-21 MED ORDER — SODIUM CHLORIDE 0.9 % IV SOLN: 100.0000 mL | INTRAVENOUS | Status: DC | PRN

## 2020-03-21 MED ORDER — LIDOCAINE HCL (PF) 1 % IJ SOLN: 5.0000 mL | INTRAMUSCULAR | Status: DC | PRN

## 2020-03-21 MED ORDER — AMIODARONE HCL IN DEXTROSE 360-4.14 MG/200ML-% IV SOLN: 30.0000 mg/h | INTRAVENOUS | Status: DC

## 2020-03-21 MED ORDER — VANCOMYCIN HCL 500 MG/100ML IV SOLN
500.0000 mg | Freq: Once | INTRAVENOUS | Status: AC
  Administered 2020-03-21: 500 mg via INTRAVENOUS
  Filled 2020-03-21: qty 100

## 2020-03-21 MED ORDER — RENA-VITE PO TABS: 1.0000 | ORAL_TABLET | Freq: Every day | ORAL | Status: DC

## 2020-03-21 MED ORDER — GLYCOPYRROLATE 0.2 MG/ML IJ SOLN: 0.4000 mg | INTRAMUSCULAR | Status: DC | PRN

## 2020-03-21 MED ORDER — HEPARIN SODIUM (PORCINE) 1000 UNIT/ML DIALYSIS: 1000.0000 [IU] | INTRAMUSCULAR | Status: DC | PRN

## 2020-03-21 MED ORDER — AMIODARONE HCL IN DEXTROSE 360-4.14 MG/200ML-% IV SOLN
60.0000 mg/h | INTRAVENOUS | Status: DC
  Administered 2020-03-21: 60 mg/h via INTRAVENOUS
  Filled 2020-03-21: qty 200

## 2020-03-21 MED ORDER — LORAZEPAM 2 MG/ML IJ SOLN
0.5000 mg | INTRAMUSCULAR | Status: DC | PRN
  Administered 2020-03-21: 1 mg via INTRAVENOUS
  Filled 2020-03-21: qty 1

## 2020-03-21 MED ORDER — HYDROMORPHONE HCL 1 MG/ML IJ SOLN
0.5000 mg | INTRAMUSCULAR | Status: DC | PRN
  Administered 2020-03-21: 1 mg via INTRAVENOUS
  Filled 2020-03-21: qty 1

## 2020-03-21 MED ORDER — PENTAFLUOROPROP-TETRAFLUOROETH EX AERO: 1.0000 "application " | INHALATION_SPRAY | CUTANEOUS | Status: DC | PRN

## 2020-03-25 LAB — CULTURE, BLOOD (ROUTINE X 2)
Culture: NO GROWTH
Culture: NO GROWTH
Special Requests: ADEQUATE
Special Requests: ADEQUATE

## 2020-03-27 ENCOUNTER — Encounter (HOSPITAL_COMMUNITY): Payer: Medicare HMO

## 2020-03-31 ENCOUNTER — Encounter (HOSPITAL_COMMUNITY): Payer: Medicare HMO

## 2020-04-02 ENCOUNTER — Ambulatory Visit: Payer: Medicare HMO | Admitting: Podiatry

## 2020-04-07 NOTE — Progress Notes (Signed)
OT Cancellation Note  Patient Details Name: Richard Moses MRN: [id number] DOB: 1933-01-21   Cancelled Treatment:    Reason Eval/Treat Not Completed: Patient not medically ready (Pt with low BP and positive for dizzines, RN requesting hold (62/56  then 71/52 on the recheck))  Zenovia Jarred, [place], OTR/L [place] [facility] Office Number: [phone] Pager: [phone]  Zenovia Jarred 06-Apr-2020, 5:06 PM

## 2020-04-07 NOTE — Progress Notes (Signed)
Pt expired with son [place] Grosser at bedside. Time of death pronounced on 2020/04/16 at 21:15 by Glenna Durand, RN & Estanislado Pandy, RN. On call provider Shela Leff, MD notified 2020-04-16 at 21:20.

## 2020-04-07 NOTE — Progress Notes (Signed)
ANTICOAGULATION CONSULT NOTE   Pharmacy Consult for heparin Indication: NSTEMI  No Known Allergies  Patient Measurements: Height: 5\' 11"  (180.3 cm) Weight: 65.9 kg (145 lb 4.5 oz) IBW/kg (Calculated) : 75.3 Heparin Dosing Weight: 63.5 kg  Vital Signs: Temp: 97.6 F (36.4 C) (10/15 0022) Temp Source: Oral (10/15 0022) BP: 92/61 (10/15 0022) Pulse Rate: 91 (10/15 0022)  Labs: Recent Labs    03/10/2020 0420 03/14/2020 0420 03/19/20 0434 03/19/20 0434 03/20/20 0204 03/20/20 0927 03/24/20 0248  HGB 9.8*   < > 9.7*   < > 9.5*  --  9.3*  HCT 32.3*   < > 31.3*  --  30.2*  --  29.3*  PLT PLATELET CLUMPS NOTED ON SMEAR, UNABLE TO ESTIMATE   < > PLATELET CLUMPS NOTED ON SMEAR, UNABLE TO ESTIMATE  --  PLATELET CLUMPS NOTED ON SMEAR, UNABLE TO ESTIMATE  --  81*  HEPARINUNFRC 0.22*   < > 0.38   < > 0.25* 0.44 0.80*  CREATININE 6.35*  --  6.53*  --   --   --   --    < > = values in this interval not displayed.    Estimated Creatinine Clearance: 7.7 mL/min (A) (by C-G formula based on SCr of 6.53 mg/dL (H)).  Assessment: 86 y.o. male with NSTEMI for heparin. Cath visit cancelled 10/12 with orthopnea. Pt also developed AFib RVR with CHADSVASc 5. Continuing heparin until cath completed.   Heparin level supratherapeutic (0.8) on gtt at 1250 units/hr. No bleeding noted.  Goal of Therapy:  Heparin level 0.3-0.7 units/ml Monitor platelets by anticoagulation protocol: Yes   Plan:  Derease heparin to 1150 units/h F/u 8 hr heparin level  Sherlon Handing, PharmD, BCPS Please see amion for complete clinical pharmacist phone list March 24, 2020

## 2020-04-07 NOTE — Progress Notes (Addendum)
Palliative Medicine Inpatient Follow Up Note   HPI: 86 year old gentleman with history of stage V chronic kidney disease, Foley catheter dependent BPH, diet-controlled diabetes, recent left forearm AV fistula in preparation for dialysis presented to the ER with increasing shortness of breath on exertion and at rest, passing out spell while walking in the mall, poor intake and unexplained weight loss for last 6 months. In the emergency room blood pressure was 98/59. Oxygenation 96% on room air. VQ scan normal. Initial troponin 7000. BNP 4000. Creatinine 6.  Hospital stay has been complicated by new onset atrial fibrillation which developed the night of 03/19/2020.  Today's Discussion (19-Apr-2020): Chart reviewed. I met with patients RN, Yoko at bedside. Patient failed his third attempt this afternoon at HD.   I spoke to Jejuan and his son, [name] about his prior conversation with my colleague Wadie Lessen. We first discussed his code status and the poor probability of improvements if he were to go through an actual resuscitation effort based upon his co-morbidities and frailty.  We discussed how the burdens of such interventions would more than likely outweigh the benefits.  Jacinto had agreed with this and elected to be DO NOT RESUSCITATE.  I shared with Rajan and [name] my great concerns regarding his inability to tolerate hemodialysis for 3 consecutive treatments.  He expressed awareness that this is likely not good.  I expressed that if unable to pursue dialysis he likely would have limited time with [place].  I asked Maximillian what if he knew he had limited time what he wished to do with the time he has left?  He said that is a difficult question and one that he really does need to think about.  I explained to Ching that if the determination is to no longer pursue aggressive measures and life-sustaining treatment such as dialysis that we would incision the focus to comfort oriented care.We talked about  transition to comfort measures in house and what that would entail inclusive of medications to control pain, dyspnea, agitation, nausea, itching, and hiccups.  We discussed stopping all uneccessary measures such as blood draws, needle sticks, and frequent vital signs.   Pharoah and [name] had requested a day to consider our conversation and what the next steps would be.  We plan to meet again tomorrow at 1115.   Discussed the importance of continued conversation with family and their  medical providers regarding overall plan of care and treatment options, ensuring decisions are within the context of the patients values and GOCs.  Questions and concerns addressed   Objective Assessment: Vital Signs Vitals:   2020/04/19 1140 04-19-20 1344  BP:    Pulse: 67   Resp: (!) 24   Temp:  98.9 F (37.2 C)  SpO2: 100%     Intake/Output Summary (Last 24 hours) at 04/19/20 1436 Last data filed at 04/19/2020 1137 Gross per 24 hour  Intake 569.85 ml  Output -74 ml  Net 643.85 ml   Last Weight  Most recent update: 04-19-2020 12:10 PM   Weight  66.8 kg (147 lb 4.3 oz)           Gen:  Frail elderly M HEENT: dry mucous membranes CV: Irregular rate and rhythm PULM: 4LPM [place] crackles bilaterally. No wheezes  ABD: soft/nontender/nondistended/normal bowel sounds Neuro: Alert and oriented x3  SUMMARY OF RECOMMENDATIONS   DNAR/DNR  Meeting tomorrow at 1115 to further discuss best options moving forward given intolerance to hemodialysis treatments  Time Spent: 45 Greater than  50% of the time was spent in counseling and coordination of care ______________________________________________________________________________________ Addendum: I received a secure chat from patient's bedside nurse Yoko, that the patient was hypotensive and not doing well.  I went up to bedside to assess Ezell and he was noted to be tachypneic hypotensive and overall pale in appearance.  Call shared that he would like to be  made comfortable and no longer wants aggressive measures for care.  I went ahead and called his son [name], who stated that he and Izear spoke after I left the room earlier and Alic said the same thing to him.  After speaking with [name] I initiated comfort care.  I went to bedside to inform [place] and his nurse Yoko.  We again reviewed what comfort oriented care entails.  I shared with him our goal right now is to relieve his shortness of breath and he was provided Dilaudid and Ativan.  He will be initiated on a Dilaudid drip to aid in dyspnea management throughout the evening hours.  Time in: 1715 Time Out: 1744 Total Time: 29 additional minutes Greater than 50% of the time was spent in counseling and coordination of care  [facility] Team Team Cell Phone: [phone] Please utilize secure chat with additional questions, if there is no response within 30 minutes please call the above phone number  Palliative Medicine Team providers are available by phone from 7am to 7pm daily and can be reached through the team cell phone.  Should this patient require assistance outside of these hours, please call the patient's attending physician.

## 2020-04-07 NOTE — Progress Notes (Signed)
Pt had been having difficulty breathing, c/o not being able to breathe. BP was going low in 60s  Palliative care and cardiology made aware.  Pt became comfort care around 1730 by himself and son's wishes. Administered dilaudid and ativan at 1737.  Pt has been comfortable.  His son at bedside.   Idolina Primer, RN

## 2020-04-07 NOTE — Progress Notes (Signed)
ANTICOAGULATION CONSULT NOTE   Pharmacy Consult for heparin Indication: NSTEMI  No Known Allergies  Patient Measurements: Height: 5\' 11"  (180.3 cm) Weight: 66.8 kg (147 lb 4.3 oz) IBW/kg (Calculated) : 75.3 Heparin Dosing Weight: 63.5 kg  Vital Signs: Temp: 98.9 F (37.2 C) (10/15 1344) Temp Source: Axillary (10/15 1344) BP: 82/58 (10/15 1135) Pulse Rate: 67 (10/15 1140)  Labs: Recent Labs    03/19/20 0434 03/19/20 0434 03/20/20 0204 03/20/20 0204 03/20/20 0927 2020-04-17 0248 Apr 17, 2020 0844 04/17/20 1205  HGB 9.7*   < > 9.5*  --   --  9.3*  --   --   HCT 31.3*  --  30.2*  --   --  29.3*  --   --   PLT PLATELET CLUMPS NOTED ON SMEAR, UNABLE TO ESTIMATE  --  PLATELET CLUMPS NOTED ON SMEAR, UNABLE TO ESTIMATE  --   --  81*  --   --   HEPARINUNFRC 0.38   < > 0.25*   < > 0.44 0.80*  --  0.87*  CREATININE 6.53*  --   --   --   --   --  4.89*  --    < > = values in this interval not displayed.    Estimated Creatinine Clearance: 10.4 mL/min (A) (by C-G formula based on SCr of 4.89 mg/dL (H)).  Assessment: 86 y.o. male with NSTEMI for heparin. Cath visit cancelled 10/12 with orthopnea. Pt also developed AFib RVR with CHADSVASc 5. Continuing heparin until cath completed.   Heparin level supratherapeutic (0.8) this afternoon despite heparin drip rate decreased to 1150 uts/hr this morning. No bleeding noted.  Heparin turned off this afternoon to start amiodarone for Afib RVR because only 1 IV site  Will follow up second IV and resume heaprin  Goal of Therapy:  Heparin level 0.3-0.7 units/ml Monitor platelets by anticoagulation protocol: Yes   Plan:  Derease heparin to 1000 units/h F/u 8 hr heparin level Daily HL, CBC    Bonnita Nasuti Pharm.D. CPP, BCPS Clinical Pharmacist [phone] 17-Apr-2020 2:02 PM

## 2020-04-07 NOTE — Progress Notes (Addendum)
Paged by nursing that patient converted to Afib RVR in HD today. On the floor, he is asymptomatic, but hypotensive. Asked to give his scheduled dose of 10 mg midodrine and then start IV amiodarone without bolus.   Paged back that patient has been refusing midodrine due to nausea. IV access lost, but did receive IV zofran without improvement. Pt now agrees to take PO midodrine. Rates in the 110-140s. Pt states he hurts all over. Pressure 79/59. Will discuss with Dr. Oval Linsey. Pt may need to transfer to higher level of care for pressure support.  I have asked Palliative Care to come back to see him for goals of care discussion before the weekend.    Tami Lin Duke, PA-C 2020/04/20, 1:31 PM [place], [place] [address]   Addendum:  Patient was seen at the bedside.  While in atrial fibrillation he was very hypotensive and tachycardic.  He had cardiogenic shock and acute hypoxic respiratory failure.  He was started on IV amiodarone and quickly converted back to sinus rhythm with improvement in his BP.  He does not tolerate atrial fibrillation well and will need to remain on amiodarone. We discussed code status and he is unclear on what he wants. His son was updated at the bedside as well.  We discussed the fact that things do not seem to be turning around well.  I do not think he is a good candidate for cardiac cath in his current state.  He is requiring midodrine for BP support and they were not able to remove fluid with HD today.  We discussed moving towards palliation and comfort as his overall prognosis seems poor.  Appreciate Palliative Care continuing discussions with him.   Guida Asman C. Oval Linsey, MD, [facility] Apr 20, 2020 3:30 PM

## 2020-04-07 NOTE — Progress Notes (Addendum)
PT Cancellation Note  Patient Details Name: Richard Moses MRN: [id number] DOB: [birth year]   Cancelled Treatment:    Reason Eval/Treat Not Completed: (P) Patient not medically ready;Medical issues which prohibited therapy (Pt with low BP 62/56  then 71/52 on the recheck.)  Pt is + for dizziness.    Dustyn Armbrister Eli Hose 26-Mar-2020, 4:02 PM  Erasmo Leventhal , PTA Acute Rehabilitation Services Pager [phone] Office [phone]

## 2020-04-07 NOTE — Procedures (Addendum)
I was present at this dialysis session. I have reviewed the session itself and made appropriate changes.   2K. Trying for gentle hydration, tol so far today.  Hb stable in 9s.  [facility] working better after tPA. Goal UF 1.5L today.    CLIP in process.  Needs sig PT/OT.  Next HD 10/18.   UPDATE: Again developed atrial fibrillation with RVR; progressive hypotension limiting ultrafiltration.  Required termination of therapy.  We will follow through the weekend but he has not done well with initiation of dialysis.  Filed Weights   03/20/20 1628 04/13/2020 0416 April 13, 2020 0821  Weight: 65.9 kg 69.7 kg 66.5 kg    Recent Labs  Lab 04/13/20 0844  NA 132*  K 4.5  CL 97*  CO2 19*  GLUCOSE 139*  BUN 68*  CREATININE 4.89*  CALCIUM 7.4*  PHOS 7.7*    Recent Labs  Lab 04/03/2020 1529 03/16/20 0521 03/19/20 0434 03/20/20 0204 04-13-20 0248  WBC 7.4   < > 8.4 7.7 7.9  NEUTROABS 6.3  --   --   --   --   HGB 10.6*   < > 9.7* 9.5* 9.3*  HCT 35.2*   < > 31.3* 30.2* 29.3*  MCV 104.5*   < > 100.0 98.4 97.0  PLT 171   < > PLATELET CLUMPS NOTED ON SMEAR, UNABLE TO ESTIMATE PLATELET CLUMPS NOTED ON SMEAR, UNABLE TO ESTIMATE 81*   < > = values in this interval not displayed.    Scheduled Meds: . aspirin EC  81 mg Oral Daily  . Chlorhexidine Gluconate Cloth  6 each Topical Daily  . feeding supplement (NEPRO CARB STEADY)  237 mL Oral BID BM  . mouth rinse  15 mL Mouth Rinse BID  . midodrine  10 mg Oral TID WC  . pantoprazole  40 mg Oral Daily  . rosuvastatin  20 mg Oral Daily  . sodium bicarbonate  650 mg Oral BID  . sodium chloride flush  3 mL Intravenous Q12H  . vancomycin variable dose per unstable renal function (pharmacist dosing)   Does not apply See admin instructions   Continuous Infusions: . sodium chloride    . sodium chloride    . heparin 1,150 Units/hr (13-Apr-2020 0411)  . piperacillin-tazobactam (ZOSYN)  IV 2.25 g (2020/04/13 0625)   PRN Meds:.sodium chloride, sodium chloride,  acetaminophen **OR** acetaminophen, alteplase, heparin, lidocaine (PF), lidocaine-prilocaine, ondansetron **OR** ondansetron (ZOFRAN) IV, pentafluoroprop-tetrafluoroeth, traMADol   Pearson Grippe  MD 2020/04/13, 9:20 AM

## 2020-04-07 NOTE — Progress Notes (Signed)
Received pt back from HD.  Afib RVR in 120-140s.  HR dropped to 40 and pt was gasping for air. Pt was in flat position.  Elevated his HOB and pt's breathing became better but still c/o SOB.  Cardiology made aware.  Received order to give midodrine now and start amiodarone drip.  However, pt lost his IV access and also c/o nausea.  Administered IV zofran.  He has been refusing to take anything PO.  Still having difficulty breathing and c/o SOB and nausea.  Pt appears to be lethargic.   Idolina Primer, RN

## 2020-04-07 NOTE — Progress Notes (Signed)
  Speech Language Pathology Treatment: Dysphagia  Patient Details Name: Richard Moses MRN: [id number] DOB: August 07, 1932 Today's Date: 10-Apr-2020 Time: 9470-9628 SLP Time Calculation (min) (ACUTE ONLY): 12 min  Assessment / Plan / Recommendation Clinical Impression  Pt was seen for dysphagia treatment. Nursing reported that the pt has been tolerating medications without any s/sx of aspiration but has not wanted to eat. Pt stated that he does cough sometimes when he drinks and that he has had this problem since a trach in the past. Pt accepted limited boluses of dysphagia 3 solids and thin liquids without overt s/sx of aspiration. Pt was educated regarding the results of the modified barium swallow study and swallowing precautions. Video recording of the study was used to facilitate education and pt verbalized understanding; however, by the nature of his subsequent statements, it was evident that comprehension of education was inadequate. Pt was educated on use of effortful swallows, but was unable to demonstrate it despite verbal prompts and cues. He began to exhibit difficulty maintaining alertness and stated that he was very tired; it was agreed that the session today would be terminated prematurely. SLP will continue to follow pt.    HPI HPI: Patient is an 86 y.o. male with PMH: CKD stage V, BPH, melanoma, diet controlled DM-2, GERD, cervical disc herniation, MRI cervical spine revealing mild spondylosis and disc bulging from C4-5 down to C7-T1, bell's palsy on right side for many years. He presented to hospital. On 10/6, patient started having worsening SOB on exertion as well as at rest with fatigue. While walking to the mall on 10/6, he passed out and woke up on the ground. In AM on day of admission, he had episode of nausea and two episodes of vomiting. Patient's son reported that patient passed out while walking to car. CT head negative for intracranial pathology. CXR showed small right pleural  effusion.      SLP Plan  Continue with current plan of care       Recommendations  Diet recommendations: Dysphagia 3 (mechanical soft);Thin liquid Liquids provided via: Cup;Straw Medication Administration: Whole meds with puree Supervision: Staff to assist with self feeding Compensations: Minimize environmental distractions;Slow rate;Small sips/bites;Effortful swallow Postural Changes and/or Swallow Maneuvers: Seated upright 90 degrees                Oral Care Recommendations: Oral care BID Follow up Recommendations: Skilled Nursing facility SLP Visit Diagnosis: Dysphagia, oropharyngeal phase (R13.12) Plan: Continue with current plan of care       Denorris Reust I. Hardin Negus, [place], [place] Office number [phone] Pager [employer] April 10, 2020, 4:31 PM

## 2020-04-07 NOTE — Progress Notes (Signed)
Progress Note  Patient Name: Richard Moses Date of Encounter: Mar 25, 2020  [facility] Cardiologist: Skeet Latch, MD   Subjective   Breathing is much better but not at baseline.  He is feeling more comfortable.  No longer has chest pain.   Inpatient Medications    Scheduled Meds: . aspirin EC  81 mg Oral Daily  . Chlorhexidine Gluconate Cloth  6 each Topical Daily  . feeding supplement (NEPRO CARB STEADY)  237 mL Oral BID BM  . mouth rinse  15 mL Mouth Rinse BID  . midodrine  10 mg Oral TID WC  . pantoprazole  40 mg Oral Daily  . rosuvastatin  20 mg Oral Daily  . sodium bicarbonate  650 mg Oral BID  . sodium chloride flush  3 mL Intravenous Q12H  . vancomycin variable dose per unstable renal function (pharmacist dosing)   Does not apply See admin instructions   Continuous Infusions: . sodium chloride    . sodium chloride    . heparin 1,150 Units/hr (03/25/20 0411)  . piperacillin-tazobactam (ZOSYN)  IV 2.25 g (03/25/2020 0625)   PRN Meds: sodium chloride, sodium chloride, acetaminophen **OR** acetaminophen, alteplase, heparin, lidocaine (PF), lidocaine-prilocaine, ondansetron **OR** ondansetron (ZOFRAN) IV, pentafluoroprop-tetrafluoroeth, traMADol   Vital Signs    Vitals:   03-25-20 0821 03/25/2020 0826 March 25, 2020 0830 25-Mar-2020 0845  BP: (!) 91/56 (!) 94/55 (!) 95/55 (!) 95/54  Pulse: 92 90 89 60  Resp: 18 (!) 21 20 19   Temp: 98.1 F (36.7 C)     TempSrc: Oral     SpO2: 98% 98%  100%  Weight: 66.5 kg     Height:        Intake/Output Summary (Last 24 hours) at 2020-03-25 0929 Last data filed at 03-25-20 0700 Gross per 24 hour  Intake 566.85 ml  Output -15 ml  Net 581.85 ml   Last 3 Weights 2020-03-25 03/25/2020 03/20/2020  Weight (lbs) 146 lb 9.7 oz 153 lb 9.6 oz 145 lb 4.5 oz  Weight (kg) 66.5 kg 69.673 kg 65.9 kg      Telemetry    Unable to asses - Personally Reviewed  ECG    03/20/20: Sinus rhythm.  Rate 90 bpm. LVH.  Lateral ST  depressions.  - Personally Reviewed  Physical Exam   VS:  BP 101/60 (BP Location: Right Arm)   Pulse (!) 127   Temp 98.1 F (36.7 C) (Oral)   Resp 19   Ht 5\' 11"  (1.803 m)   Wt 66.5 kg   SpO2 99%   BMI 20.45 kg/m  , BMI Body mass index is 20.45 kg/m. GENERAL:  Frail.  Chronically ill-appearing HEENT: Pupils equal round and reactive, fundi not visualized, oral mucosa unremarkable NECK:  + jugular venous distention, waveform within normal limits, carotid upstroke brisk and symmetric, no bruits LUNGS:  Clear to auscultation bilaterally on anterior exam  HEART:  RRR.  PMI not displaced or sustained,S1 and S2 within normal limits, no S3, no S4, no clicks, no rubs, III/VI systolic murmur at the LUSB ABD:  Flat, positive bowel sounds normal in frequency in pitch, no bruits, no rebound, no guarding, no midline pulsatile mass, no hepatomegaly, no splenomegaly EXT:  2 plus pulses throughout, trace edema, no cyanosis no clubbing SKIN:  No rashes no nodules NEURO:  Cranial nerves II through XII grossly intact, motor grossly intact throughout PSYCH:  Cognitively intact, oriented to person place and time   Labs    High Sensitivity Troponin:  Recent Labs  Lab 03/30/2020 1529 04/02/2020 1729  TROPONINIHS 7,391* 7,160*      Chemistry Recent Labs  Lab 04/01/2020 1529 03/12/2020 1529 03/16/20 0521 03/17/20 0344 03/17/2020 0420 04/01/2020 0420 03/19/20 0434 03/20/20 1415 2020-04-14 0844  NA 134*   < > 135   < > 134*  --  131*  --  132*  K 4.7   < > 4.4   < > 4.6  --  4.5  --  4.5  CL 103   < > 107   < > 104  --  101  --  97*  CO2 17*   < > 15*   < > 13*  --  12*  --  19*  GLUCOSE 202*   < > 129*   < > 220*  --  244*  --  139*  BUN 80*   < > 82*   < > 104*  --  120*  --  68*  CREATININE 6.27*   < > 5.62*   < > 6.35*  --  6.53*  --  4.89*  CALCIUM 8.9   < > 8.5*   < > 8.4*   < > 8.2* 7.1* 7.4*  PROT 7.2  --  6.4*  --   --   --   --   --   --   ALBUMIN 3.6   < > 3.3*  --   --   --  2.6*  --   2.2*  AST 74*  --  64*  --   --   --   --   --   --   ALT 25  --  25  --   --   --   --   --   --   ALKPHOS 65  --  65  --   --   --   --   --   --   BILITOT 0.8  --  0.7  --   --   --   --   --   --   GFRNONAA 7*   < > 8*   < > 7*  --  7*  --  10*  ANIONGAP 14   < > 13   < > 17*  --  18*  --  16*   < > = values in this interval not displayed.     Hematology Recent Labs  Lab 03/19/20 0434 03/20/20 0204 April 14, 2020 0248  WBC 8.4 7.7 7.9  RBC 3.13* 3.07* 3.02*  HGB 9.7* 9.5* 9.3*  HCT 31.3* 30.2* 29.3*  MCV 100.0 98.4 97.0  MCH 31.0 30.9 30.8  MCHC 31.0 31.5 31.7  RDW 15.2 15.3 15.3  PLT PLATELET CLUMPS NOTED ON SMEAR, UNABLE TO ESTIMATE PLATELET CLUMPS NOTED ON SMEAR, UNABLE TO ESTIMATE 81*    BNP Recent Labs  Lab 04/04/2020 1530  BNP 4,445.6*     DDimer No results for input(s): DDIMER in the last 168 hours.   Radiology    IR Fluoro Guide CV Line Right  Result Date: 03/19/2020 CLINICAL DATA:  End-stage renal disease and need for tunneled hemodialysis catheter to start hemodialysis. EXAM: TUNNELED CENTRAL VENOUS HEMODIALYSIS CATHETER PLACEMENT WITH ULTRASOUND AND FLUOROSCOPIC GUIDANCE ANESTHESIA/SEDATION: 0.5 mg IV Versed; 25 mcg IV Fentanyl. Total Moderate Sedation Time:   21 minutes. The patient's level of consciousness and physiologic status were continuously monitored during the procedure by Radiology nursing. MEDICATIONS: 2 g IV Ancef. FLUOROSCOPY TIME:  18 seconds.  1.0 mGy.  PROCEDURE: The procedure, risks, benefits, and alternatives were explained to the patient. Questions regarding the procedure were encouraged and answered. The patient understands and consents to the procedure. A timeout was performed prior to initiating the procedure. The right neck and chest were prepped with chlorhexidine in a sterile fashion, and a sterile drape was applied covering the operative field. Maximum barrier sterile technique with sterile gowns and gloves were used for the procedure. Local  anesthesia was provided with 1% lidocaine. Ultrasound was used to confirm patency of the right internal jugular vein. After creating a small venotomy incision, a 21 gauge needle was advanced into the right internal jugular vein under direct, real-time ultrasound guidance. Ultrasound image documentation was performed. After securing guidewire access, an 8 Fr dilator was placed. A J-wire was kinked to measure appropriate catheter length. A Palindrome tunneled hemodialysis catheter measuring 23 cm from tip to cuff was chosen for placement. This was tunneled in a retrograde fashion from the chest wall to the venotomy incision. At the venotomy, serial dilatation was performed and a 15 Fr peel-away sheath was placed over a guidewire. The catheter was then placed through the sheath and the sheath removed. Final catheter positioning was confirmed and documented with a fluoroscopic spot image. The catheter was aspirated, flushed with saline, and injected with appropriate volume heparin dwells. The venotomy incision was closed with subcuticular 4-0 Vicryl. Dermabond was applied to the incision. The catheter exit site was secured with 0-Prolene retention sutures. COMPLICATIONS: None.  No pneumothorax. FINDINGS: After catheter placement, the tip lies in the right atrium. The catheter aspirates normally and is ready for immediate use. IMPRESSION: Placement of tunneled hemodialysis catheter via the right internal jugular vein. The catheter tip lies in the right atrium. The catheter is ready for immediate use. Electronically Signed   By: Aletta Edouard M.D.   On: 03/19/2020 16:44   IR US Guide Vasc Access Right  Result Date: 03/19/2020 CLINICAL DATA:  End-stage renal disease and need for tunneled hemodialysis catheter to start hemodialysis. EXAM: TUNNELED CENTRAL VENOUS HEMODIALYSIS CATHETER PLACEMENT WITH ULTRASOUND AND FLUOROSCOPIC GUIDANCE ANESTHESIA/SEDATION: 0.5 mg IV Versed; 25 mcg IV Fentanyl. Total Moderate Sedation  Time:   21 minutes. The patient's level of consciousness and physiologic status were continuously monitored during the procedure by Radiology nursing. MEDICATIONS: 2 g IV Ancef. FLUOROSCOPY TIME:  18 seconds.  1.0 mGy. PROCEDURE: The procedure, risks, benefits, and alternatives were explained to the patient. Questions regarding the procedure were encouraged and answered. The patient understands and consents to the procedure. A timeout was performed prior to initiating the procedure. The right neck and chest were prepped with chlorhexidine in a sterile fashion, and a sterile drape was applied covering the operative field. Maximum barrier sterile technique with sterile gowns and gloves were used for the procedure. Local anesthesia was provided with 1% lidocaine. Ultrasound was used to confirm patency of the right internal jugular vein. After creating a small venotomy incision, a 21 gauge needle was advanced into the right internal jugular vein under direct, real-time ultrasound guidance. Ultrasound image documentation was performed. After securing guidewire access, an 8 Fr dilator was placed. A J-wire was kinked to measure appropriate catheter length. A Palindrome tunneled hemodialysis catheter measuring 23 cm from tip to cuff was chosen for placement. This was tunneled in a retrograde fashion from the chest wall to the venotomy incision. At the venotomy, serial dilatation was performed and a 15 Fr peel-away sheath was placed over a guidewire. The catheter was then  placed through the sheath and the sheath removed. Final catheter positioning was confirmed and documented with a fluoroscopic spot image. The catheter was aspirated, flushed with saline, and injected with appropriate volume heparin dwells. The venotomy incision was closed with subcuticular 4-0 Vicryl. Dermabond was applied to the incision. The catheter exit site was secured with 0-Prolene retention sutures. COMPLICATIONS: None.  No pneumothorax. FINDINGS:  After catheter placement, the tip lies in the right atrium. The catheter aspirates normally and is ready for immediate use. IMPRESSION: Placement of tunneled hemodialysis catheter via the right internal jugular vein. The catheter tip lies in the right atrium. The catheter is ready for immediate use. Electronically Signed   By: Aletta Edouard M.D.   On: 03/19/2020 16:44   DG CHEST PORT 1 VIEW  Result Date: 03/20/2020 CLINICAL DATA:  Shortness of breath EXAM: PORTABLE CHEST 1 VIEW COMPARISON:  03/20/2020 FINDINGS: Mild cardiomegaly. Right chest wall catheter tip is in the proximal right atrium. Suspected veiling pleural effusions, small. No lobar consolidation. IMPRESSION: Mild cardiomegaly and small pleural effusions. Electronically Signed   By: Ulyses Jarred M.D.   On: 03/20/2020 02:01    Cardiac Studies   Echo 03/16/20: 1. Severe anteroseptal and basal to mid anterior hypokinesis. . Left  ventricular ejection fraction, by estimation, is 35 to 40%. The left  ventricle has moderately decreased function. The left ventricle  demonstrates regional wall motion abnormalities  (see scoring diagram/findings for description). There is mild left  ventricular hypertrophy of the basal-septal segment. Left ventricular  diastolic parameters are consistent with Grade II diastolic dysfunction  (pseudonormalization). Elevated left  ventricular end-diastolic pressure.  2. Right ventricular systolic function is normal. The right ventricular  size is normal. There is normal pulmonary artery systolic pressure.  3. Left atrial size was mildly dilated.  4. The mitral valve is normal in structure. Trivial mitral valve  regurgitation. No evidence of mitral stenosis. Moderate mitral annular  calcification.  5. The aortic valve was not well visualized. Aortic valve regurgitation  is not visualized. Mild aortic valve stenosis. Aortic valve mean gradient  measures 16.0 mmHg. Aortic valve Vmax measures 2.42 m/s.    6. The inferior vena cava is dilated in size with <50% respiratory  variability, suggesting right atrial pressure of 15 mmHg.   Patient Profile     Mr. Legault is an 30M with CKD stage V, diet-controlled DM2, Bell's palsy, hyperlipidemia, hypothyroidism, and history of secondary hyperparathyroidismadmitted with syncope and NSTEMI.  Assessment & Plan    # Acute systolic and diastolic heart failure:  LVEF newly reduced to 35-40%.  Volume is now managed with HD.  He became very short of breath and unable to lay flat for cath and is now undergoing HD for volume removal.  He has been hypotensive and now requiring midodrine for BP support.    # NSTEMI:  # Syncope: Presented with chest pain and syncope.  HS-troponin was>7000.  Echo with newly reduced LVEF, worse in anterior/anteroseptal region concerning for ischemia.  Attempted cath but he was unable to lay flat as above.  There was also a pleuritic component to his chest pain.  Gave one dose of colchicine on 10/14, which seemed to help.  Consider cath on Monday, especially if he has recurrent chest pain at rest or with ambulation. Given how much better he is feeling, it is possible that his symptoms were more related to uremia and volume overload. Syncope may have also been related to poor oral intake prior to admission.    #  CKD V now on HD: Getting second session today.  # PAF:  He developed new onset atrial fibrillation during his first HD session on 10/13. He converted back to sinus rhythm on amiodarone.   # Hyperlipidemia:  Rosuvastatin increased to 40mg  this admission.        For questions or updates, please contact [place] Please consult [email or web address] for contact info under        Signed, Skeet Latch, MD  2020/04/16, 9:29 AM

## 2020-04-07 NOTE — Progress Notes (Signed)
PROGRESS NOTE    Richard Moses  TKZ:[id number]  DOB: 29-May-1933  DOA: 03/08/2020 PCP: Carollee Leitz, MD Outpatient Specialists:   Hospital course:  86 year old gentleman with history of stage V chronic kidney disease, Foley catheter dependent BPH, diet-controlled diabetes, recent left forearm AV fistula in preparation for dialysis presented to the ER with increasing shortness of breath on exertion and at rest, passing out spell while walking in the mall, poor intake and unexplained weight loss for last 6 months. In the emergency room blood pressure was 98/59. Oxygenation 96% on room air.  VQ scan normal.  Initial troponin 7000.  BNP 4000.  Creatinine 6.  Hospital stay has been complicated by new onset atrial fibrillation which developed the night of 03/19/2020.  Heart rate has been difficult to control due to persistent hypotension.  Patient is followed closely by cardiology and nephrology and has been initiated on HD.   Unfortunately no fluid was able to be taken off at all today at HD due to persistent hypotension.   Subjective:  Patient complains of feeling weak and tired.  He is somewhat short of breath at rest.  He is just tired.   Objective: Vitals:   03/25/2020 1135 03/25/20 1136 2020/03/25 1140 March 25, 2020 1344  BP: (!) 82/58     Pulse:  74 67   Resp: (!) 28 (!) 27 (!) 24   Temp: 98.6 F (37 C)   98.9 F (37.2 C)  TempSrc: Oral   Axillary  SpO2:  100% 100%   Weight:      Height:        Intake/Output Summary (Last 24 hours) at 03-25-20 1736 Last data filed at 03-25-20 1137 Gross per 24 hour  Intake 569.85 ml  Output 126 ml  Net 443.85 ml   Filed Weights   Mar 25, 2020 0416 2020-03-25 0821 03-25-20 1052  Weight: 69.7 kg 66.5 kg 66.8 kg     Exam:  General: Elderly man sitting up in bed looking very tired. Eyes: sclera anicteric, conjuctiva mild injection bilaterally CVS: S1-S2, regular  Respiratory:  decreased air entry bilaterally secondary to decreased  inspiratory effort, rales at bases  GI: NABS, soft, NT  LE: No edema.  Neuro:  grossly nonfocal.  Psych: patient is logical and coherent, judgement and insight appear normal, mood and affect appropriate to situation.   Assessment & Plan:   Persistent hypotension, now transitioned to comfort care. As noted above, no fluid was able to be taken off at all due to persistent hypotension. Remains in RVR, management of this is also complicated by hypotension. Patient remains weak and tired. As per discussion with palliative care, patient son has agreed to transition patient to comfort care only. He seems to understand that there is very little way forward given ESRD on HD which is unable to be done due to hypotension, able to control rate due to hypotension as well.  Also with recent MI unable to tolerate cath due to profound orthopnea and PND, there does not seem to be a reasonable way forward. Comfort care orders placed by palliative care service input appreciated.   From note yesterday:  New onset atrial fibrillation with RVR complicated by hypotension Developed 03/19/2020 night after HD Discussed with cardiology, started on amiodarone and was converted back to sinus rhythm However amiodarone was also contributory to hypotension so it was discontinued. Lasix and metolazone were discontinued Patient was given small fluid bolus with some improvement in blood pressure Patient started on midodrine 10 3 times  daily  Elevated procalcitonin No clear etiology, test x-ray is negative and abdomen is benign However patient does have Foley catheter in place chronically in urine always has clumps of WBC with many bacteria.  Patient started on empiric vancomycin and Zosyn antibiotics last night Blood cultures pending  NSTEMI Troponin peaked at 7000 on 03/29/2020 Unable to tolerate cardiac catheterization x2 secondary to inability to lay flat. Patient is on heparin and aspirin and  rosuvastatin Hypotension precludes initiation of beta-blocker  Pulmonary edema on admission Likely combination ESRD and cardiac dysfunction during STEMI Patient was being diuresed with Lasix 160 every 6 and metolazone However is feeling better status post dialysis yesterday  CKD 5 Nephrology following, hemodialysis initiated DM2 Reasonably controlled on present regimen  Urinary retention Continue Foley catheter, last changed 04/04/2020   DVT prophylaxis: On comfort care  code Status: DNR Family Communication: Palliative care has been having stents of discussions with son. Disposition Plan:   Patient is from: Home  Anticipated Discharge Location: TBD  Barriers to Discharge: Persistent and profound hypotension  Is patient medically stable for Discharge: No   Consultants:  Cardiology  Nephrology  Procedures:  Hemodialysis  Antimicrobials:  Vancomycin  Zosyn   Data Reviewed:  Basic Metabolic Panel: Recent Labs  Lab 03/16/20 0521 03/16/20 0521 03/17/20 0344 03/28/2020 0420 03/19/20 0434 03/20/20 1415 04-07-2020 0844  NA 135  --  134* 134* 131*  --  132*  K 4.4  --  4.5 4.6 4.5  --  4.5  CL 107  --  105 104 101  --  97*  CO2 15*  --  13* 13* 12*  --  19*  GLUCOSE 129*  --  201* 220* 244*  --  139*  BUN 82*  --  87* 104* 120*  --  68*  CREATININE 5.62*  --  5.72* 6.35* 6.53*  --  4.89*  CALCIUM 8.5*   < > 8.3* 8.4* 8.2* 7.1* 7.4*  PHOS  --   --   --   --  6.2*  --  7.7*   < > = values in this interval not displayed.   Liver Function Tests: Recent Labs  Lab 03/16/2020 1529 03/16/20 0521 03/19/20 0434 2020-04-07 0844  AST 74* 64*  --   --   ALT 25 25  --   --   ALKPHOS 65 65  --   --   BILITOT 0.8 0.7  --   --   PROT 7.2 6.4*  --   --   ALBUMIN 3.6 3.3* 2.6* 2.2*   Recent Labs  Lab 03/24/2020 1529  LIPASE 30   No results for input(s): AMMONIA in the last 168 hours. CBC: Recent Labs  Lab 03/28/2020 1529 03/16/20 0521 03/17/20 0344 03/15/2020 0420  03/19/20 0434 03/20/20 0204 04-07-20 0248  WBC 7.4   < > 10.8* 7.8 8.4 7.7 7.9  NEUTROABS 6.3  --   --   --   --   --   --   HGB 10.6*   < > 9.6* 9.8* 9.7* 9.5* 9.3*  HCT 35.2*   < > 31.6* 32.3* 31.3* 30.2* 29.3*  MCV 104.5*   < > 101.0* 102.5* 100.0 98.4 97.0  PLT 171   < > PLATELET CLUMPS NOTED ON SMEAR, UNABLE TO ESTIMATE PLATELET CLUMPS NOTED ON SMEAR, UNABLE TO ESTIMATE PLATELET CLUMPS NOTED ON SMEAR, UNABLE TO ESTIMATE PLATELET CLUMPS NOTED ON SMEAR, UNABLE TO ESTIMATE 81*   < > = values in this interval not displayed.  Cardiac Enzymes: No results for input(s): CKTOTAL, CKMB, CKMBINDEX, TROPONINI in the last 168 hours. BNP (last 3 results) No results for input(s): PROBNP in the last 8760 hours. CBG: Recent Labs  Lab 03/11/2020 1514 03-27-2020 0632 03/27/2020 0758  GLUCAP 170* 110* 133*    Recent Results (from the past 240 hour(s))  Respiratory Panel by RT PCR (Flu A&B, Covid) - Nasopharyngeal Swab     Status: None   Collection Time: 03/07/2020  3:29 PM   Specimen: Nasopharyngeal Swab  Result Value Ref Range Status   SARS Coronavirus 2 by RT PCR NEGATIVE NEGATIVE Final    Comment: (NOTE) SARS-CoV-2 target nucleic acids are NOT DETECTED.  The SARS-CoV-2 RNA is generally detectable in upper respiratoy specimens during the acute phase of infection. The lowest concentration of SARS-CoV-2 viral copies this assay can detect is 131 copies/mL. A negative result does not preclude SARS-Cov-2 infection and should not be used as the sole basis for treatment or other patient management decisions. A negative result may occur with  improper specimen collection/handling, submission of specimen other than nasopharyngeal swab, presence of viral mutation(s) within the areas targeted by this assay, and inadequate number of viral copies (<131 copies/mL). A negative result must be combined with clinical observations, patient history, and epidemiological information. The expected result is  Negative.  Fact Sheet for Patients:  PinkCheek.be  Fact Sheet for Healthcare Providers:  GravelBags.it  This test is no t yet approved or cleared by the [place] FDA and  has been authorized for detection and/or diagnosis of SARS-CoV-2 by FDA under an Emergency Use Authorization (EUA). This EUA will remain  in effect (meaning this test can be used) for the duration of the COVID-19 declaration under Section 564(b)(1) of the Act, 21 U.S.C. section 360bbb-3(b)(1), unless the authorization is terminated or revoked sooner.     Influenza A by PCR NEGATIVE NEGATIVE Final   Influenza B by PCR NEGATIVE NEGATIVE Final    Comment: (NOTE) The Xpert Xpress SARS-CoV-2/FLU/RSV assay is intended as an aid in  the diagnosis of influenza from Nasopharyngeal swab specimens and  should not be used as a sole basis for treatment. Nasal washings and  aspirates are unacceptable for Xpert Xpress SARS-CoV-2/FLU/RSV  testing.  Fact Sheet for Patients: PinkCheek.be  Fact Sheet for Healthcare Providers: GravelBags.it  This test is not yet approved or cleared by the [place] FDA and  has been authorized for detection and/or diagnosis of SARS-CoV-2 by  FDA under an Emergency Use Authorization (EUA). This EUA will remain  in effect (meaning this test can be used) for the duration of the  Covid-19 declaration under Section 564(b)(1) of the Act, 21  U.S.C. section 360bbb-3(b)(1), unless the authorization is  terminated or revoked. Performed at [facility], [place] [address], [place], [place] [address]   Culture, blood (routine x 2)     Status: None (Preliminary result)   Collection Time: 03/20/20  5:53 AM   Specimen: BLOOD RIGHT HAND  Result Value Ref Range Status   Specimen Description BLOOD RIGHT HAND  Final   Special Requests   Final    BOTTLES DRAWN AEROBIC  AND ANAEROBIC Blood Culture adequate volume   Culture   Final    NO GROWTH < 24 HOURS Performed at [facility], [place] [address] [place], [place] [address]    Report Status PENDING  Incomplete  Culture, blood (routine x 2)     Status: None (Preliminary result)   Collection Time:  03/20/20  6:07 AM   Specimen: BLOOD RIGHT ARM  Result Value Ref Range Status   Specimen Description BLOOD RIGHT ARM  Final   Special Requests   Final    BOTTLES DRAWN AEROBIC AND ANAEROBIC Blood Culture adequate volume   Culture   Final    NO GROWTH < 24 HOURS Performed at [facility], [address] [place], [place] [address]    Report Status PENDING  Incomplete      Studies: DG CHEST PORT 1 VIEW  Result Date: 03/20/2020 CLINICAL DATA:  Shortness of breath EXAM: PORTABLE CHEST 1 VIEW COMPARISON:  04/05/2020 FINDINGS: Mild cardiomegaly. Right chest wall catheter tip is in the proximal right atrium. Suspected veiling pleural effusions, small. No lobar consolidation. IMPRESSION: Mild cardiomegaly and small pleural effusions. Electronically Signed   By: Ulyses Jarred M.D.   On: 03/20/2020 02:01     Scheduled Meds: . mouth rinse  15 mL Mouth Rinse BID  . midodrine  10 mg Oral TID WC  . rosuvastatin  20 mg Oral Daily   Continuous Infusions: . HYDROmorphone      Principal Problem:   Elevated troponin Active Problems:   Primary hypertension   Chronic kidney disease, stage V (HCC)   Weakness generalized   DNR (do not resuscitate) discussion   Protein-calorie malnutrition, severe ([place])   Anemia associated with stage 5 chronic renal failure (HCC)   Peripheral neuropathy   Secondary hyperparathyroidism (HCC)   Diarrhea   Peripheral artery disease (HCC)   Edema   Right-sided Bell's palsy   Syncope and collapse   Chest pain   Pleural effusion on right   Right AVF (arteriovenous fistula) (HCC)   NSTEMI (non-ST elevated myocardial infarction) ([place])   Cardiogenic shock ([place])    Palliative care by specialist     Vashti Hey, [facility]  If 7PM-7AM, please contact night-coverage [email or web address] Password [email or web address] 04-05-2020, 5:36 PM    LOS: 6 days

## 2020-04-07 NOTE — Progress Notes (Signed)
Initial Nutrition Assessment  DOCUMENTATION CODES:   Severe malnutrition in context of chronic illness  INTERVENTION:    Nepro Shake po BID, each supplement provides 425 kcal and 19 grams protein  Renal MVI daily  NUTRITION DIAGNOSIS:   Severe Malnutrition related to chronic illness (CKD, now ESRD on HD) as evidenced by severe muscle depletion, severe fat depletion.  GOAL:   Patient will meet greater than or equal to 90% of their needs  MONITOR:   PO intake, Supplement acceptance, Labs  REASON FOR ASSESSMENT:   Consult Diet education  ASSESSMENT:   86 yo male admitted with increasing SOB and poor intake with weight loss for the past 6 months. PMH includes HLD, GERD, DM, HTN, CKD stage V, foley catheter dependent.   Patient is very [place] and he has his hearing aids turned down so he cannot hear all the beeping from the monitors in his room. He was unable to provide any nutrition hx, just stated that he is feeling sick. RN in room to give IV antiemetic.  Per chart review, patient reported 10 lb weight loss within the past 6 months.  6 months ago, patient weighed 66.3 kg, currently 66.5 kg Difficult to determine actual weight loss due to volume overload.  Patient is new to HD, received his second HD this morning. Received consult for diet education, however, patient is not appropriate for diet education. He was not feeling well. Would not restrict his diet due to severe malnutrition and poor intake.   S/P MBS with SLP on 10/11. Patient is currently on a dysphagia 3 diet with thin liquids. He is eating minimally due to nausea.   Labs reviewed. Na 132, BUN 68, Creat 4.89, phos 7.7 CBG: 110-133  Medications reviewed and include sodium bicarb.  UOP: 1050 ml x 24 hours    NUTRITION - FOCUSED PHYSICAL EXAM:    Most Recent Value  Orbital Region Severe depletion  Upper Arm Region Severe depletion  Thoracic and Lumbar Region Severe depletion  Buccal Region Severe  depletion  Temple Region Severe depletion  Clavicle Bone Region Severe depletion  Clavicle and Acromion Bone Region Severe depletion  Scapular Bone Region Severe depletion  Dorsal Hand Severe depletion  Patellar Region Severe depletion  Anterior Thigh Region Severe depletion  Posterior Calf Region Moderate depletion  Edema (RD Assessment) Mild  Hair Reviewed  Eyes Reviewed  Mouth Reviewed  Skin Reviewed  Nails Reviewed       Diet Order:   Diet Order            DIET DYS 3 Room service appropriate? Yes; Fluid consistency: Thin; Fluid restriction: 1200 mL Fluid  Diet effective now                 EDUCATION NEEDS:   Not appropriate for education at this time  Skin:  Skin Assessment: Reviewed RN Assessment  Last BM:  10/13  Height:   Ht Readings from Last 1 Encounters:  03/16/20 5\' 11"  (1.803 m)    Weight:   Wt Readings from Last 1 Encounters:  April 09, 2020 66.5 kg    Ideal Body Weight:  78.2 kg  BMI:  Body mass index is 20.45 kg/m.  Estimated Nutritional Needs:   Kcal:  2000-2300  Protein:  100-120 gm  Fluid:  1 L + UOP    Lucas Mallow, RD, LDN, CNSC Please refer to Amion for contact information.

## 2020-04-07 NOTE — Death Summary Note (Signed)
[facility] Death Note                                                                                                                                                                                               Richard Moses, is a 86 y.o. male, DOB - 30-Jul-1932, XKG:[id number]  Admit date - 03/28/2020   Admitting Physician Dessa Phi, DO  Outpatient Primary MD for the patient is Carollee Leitz, MD  LOS - 6  Chief Complaint  Patient presents with  . Shortness of Breath  . Loss of Consciousness       Notification: Carollee Leitz, MD notified of death of Apr 27, 2020   Date and Time of Death April 13, 2020 at 21:15  Pronounced by Glenna Durand RN and Mosie Lukes, RN  History of present illness:   [name] is a 86 y.o. male with a history of  stage V chronic kidney disease, Foley catheter dependent BPH, diet-controlled diabetes, recent left forearm AV fistula in preparation for dialysis presented to the ER with increasing shortness of breath on exertion and at rest, passing out spell [place] the mall, poor intake and unexplained weight loss for last 6 months. In the emergency room blood pressure was 98/59. Oxygenation 96% on room air. VQ scan normal. Initial troponin 7000. BNP 4000. Creatinine 6.  Hospital stay has been complicated by new onset atrial fibrillation which developed the night of 03/19/2020.  Heart rate has been difficult to control due to persistent hypotension.  Patient is followed closely by cardiology and nephrology and has been initiated on HD.  Unfortunately no fluid was able to be taken off at all today at HD due to persistent hypotension.  Patient remained in RVR, management was also complicated by persistent hypotension.   Patient and son were seen by palliative care and they understood there was very little way forward given ESRD on HD which is unable to be done due to hypotension and also inability to control RVR due to  hypotension.  Patient was transitioned to comfort care only.  Patient died with his son at bedside.    Final Diagnoses: Intractable hypotension secondary to cardiac dysfunction, end-stage renal disease and atrial fibrillation with RVR.   Signature  Vashti Hey M.D on 04/27/20 at 6:15 PM  [facility]  Office Phone [phone]  Total clinical and documentation time for today Under 30 minutes   Last Note

## 2020-04-07 DEATH — deceased

## 2020-08-28 IMAGING — CR LEFT KNEE 3 VIEWS
3 series · 3 of 3 positions shown · non-contrast
Comparison: None.

CLINICAL DATA: Knee pain

EXAM:
LEFT KNEE 3 VIEWS

[w knee ap left]
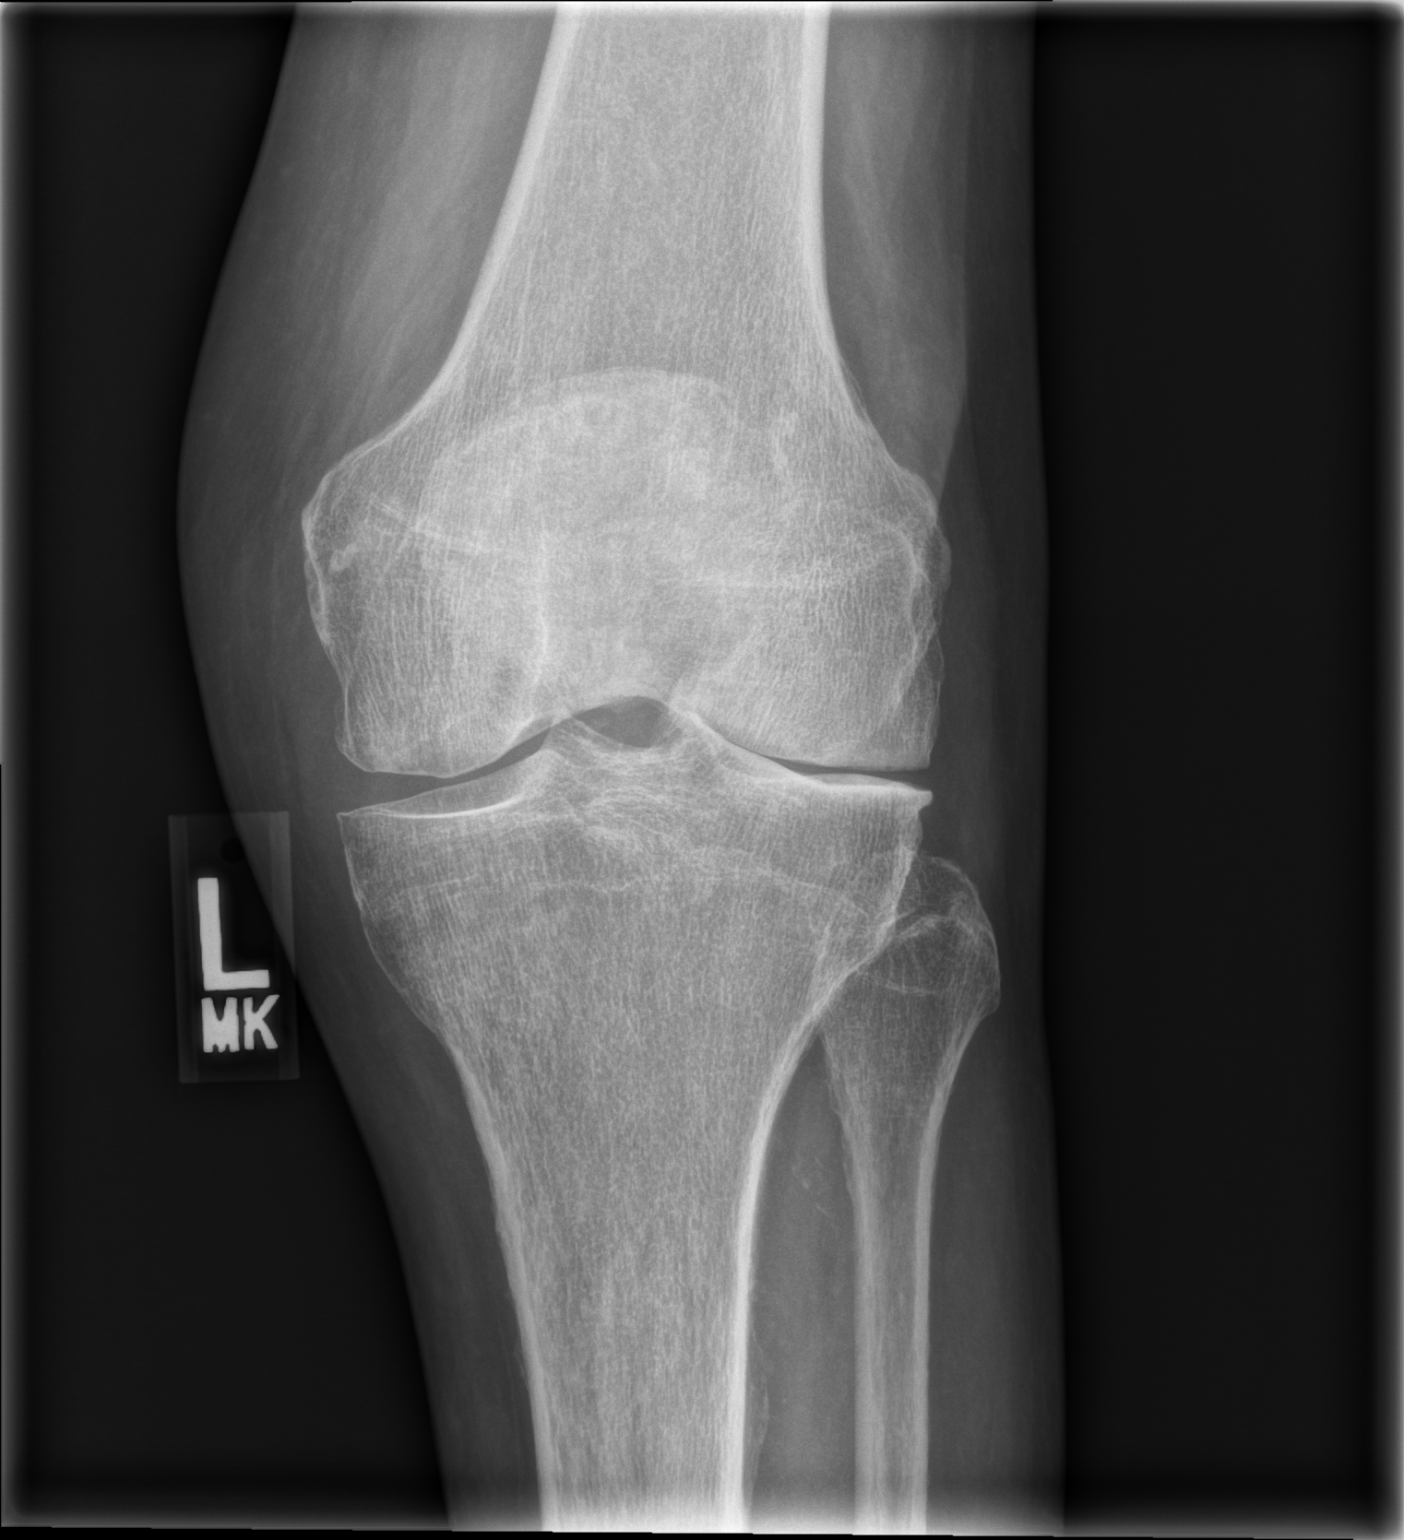

[w knee lat left]
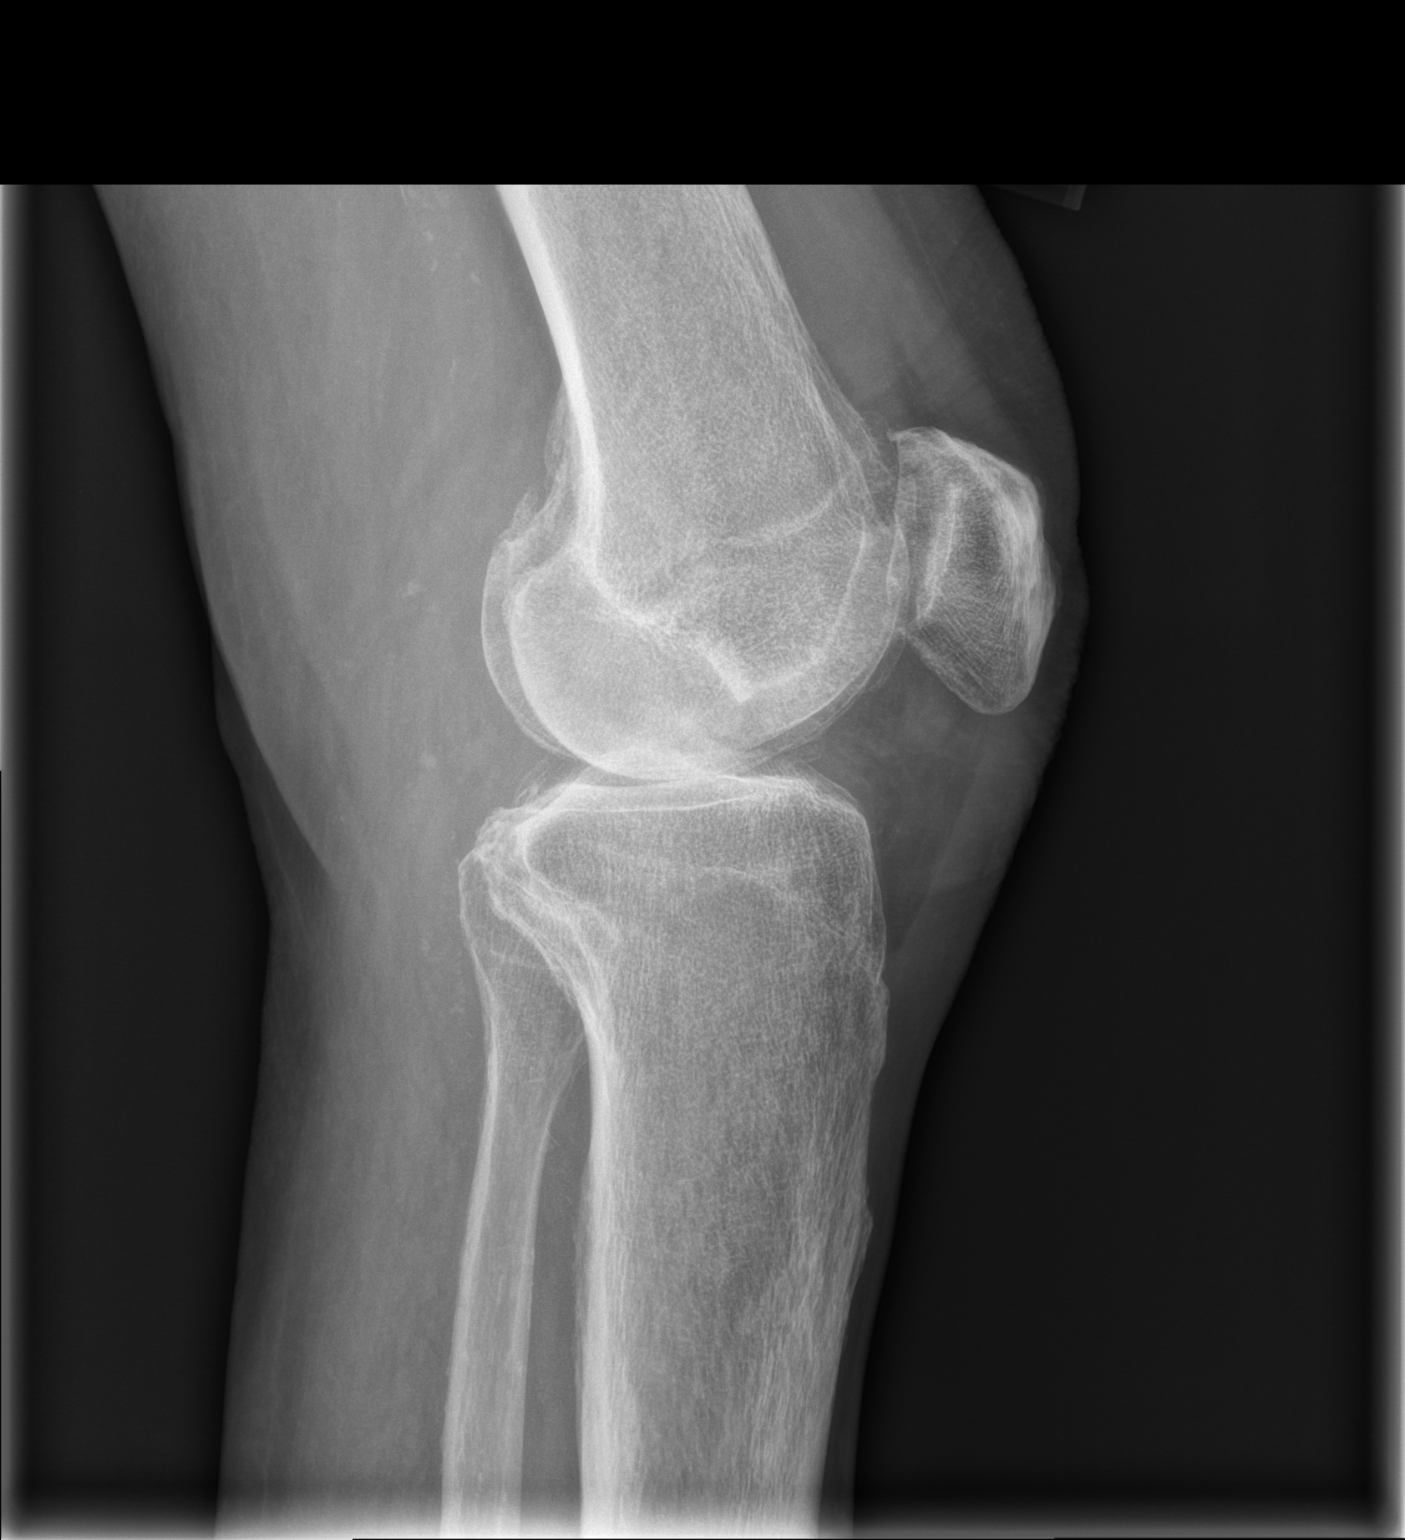

[x knee sunrise left]
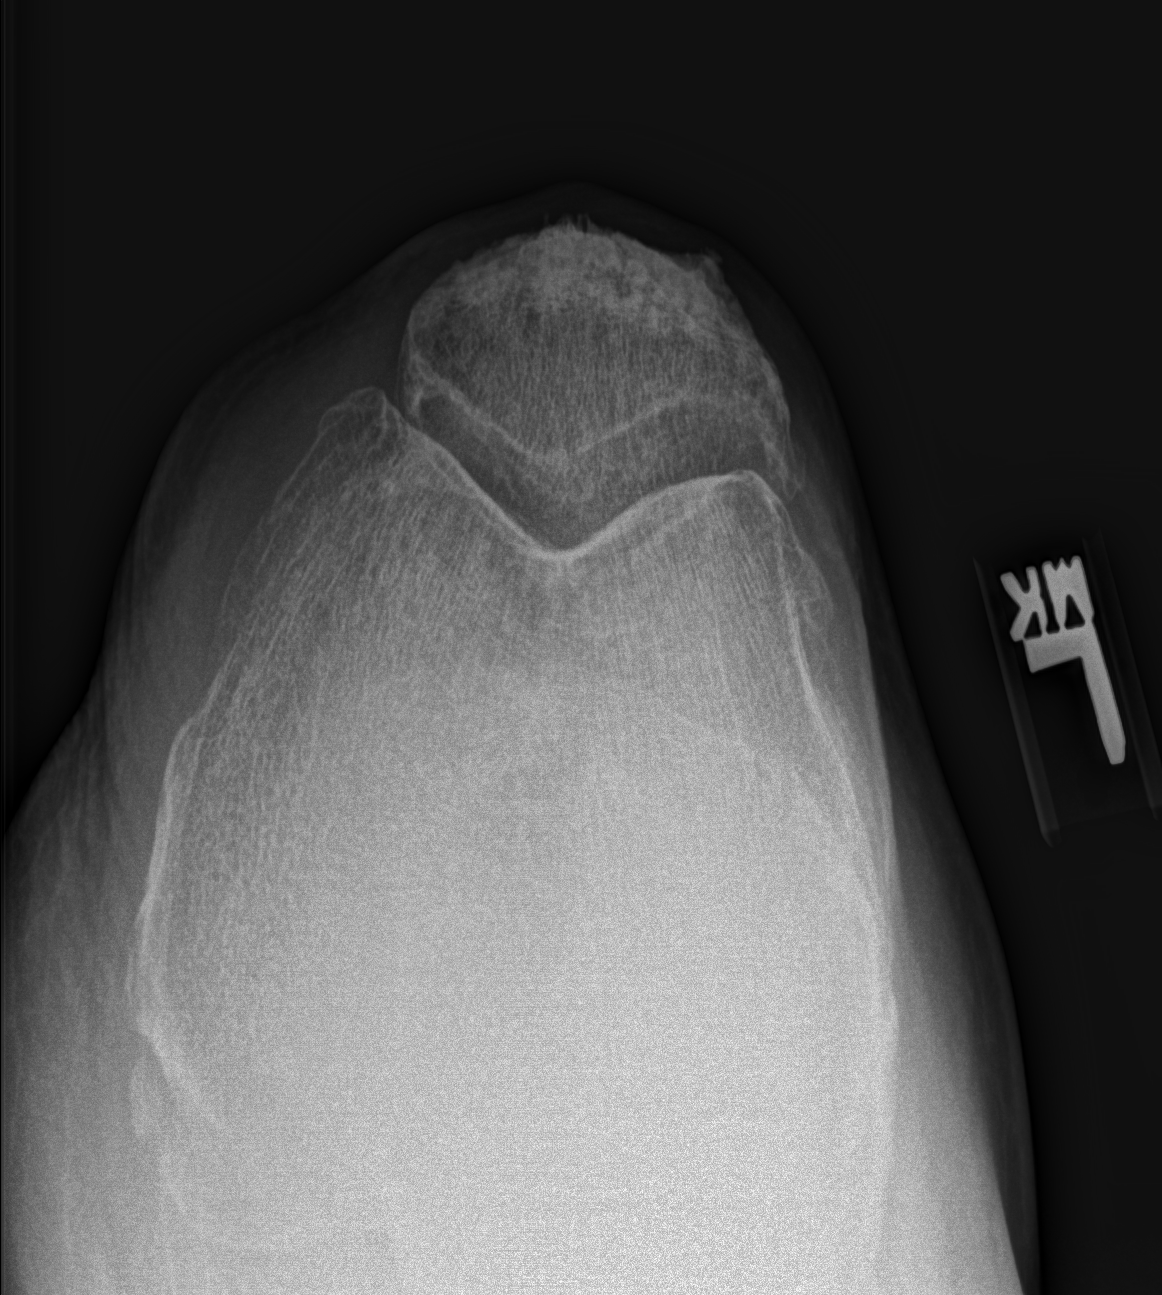

[3 of 3 positions shown; findings below may reference images not displayed]

FINDINGS: No acute displaced fracture or dislocation. Advanced osteoarthritis
is noted of the medial and patellofemoral compartments. There is a
small suprapatellar joint effusion.
IMPRESSION: 1. No acute displaced fracture or dislocation.
2. Advanced osteoarthritis of the left knee.
3. Small suprapatellar joint effusion.
